# Patient Record
Sex: Male | Born: 1935
Health system: Southern US, Community
[De-identification: ages and names within clinical notes are randomized; demographics above are authoritative.]

## PROBLEM LIST (undated history)

## (undated) DIAGNOSIS — H269 Unspecified cataract: Secondary | ICD-10-CM

## (undated) DIAGNOSIS — I2510 Atherosclerotic heart disease of native coronary artery without angina pectoris: Secondary | ICD-10-CM

## (undated) DIAGNOSIS — Z860100 Personal history of colon polyps, unspecified: Secondary | ICD-10-CM

## (undated) DIAGNOSIS — T7840XA Allergy, unspecified, initial encounter: Secondary | ICD-10-CM

## (undated) DIAGNOSIS — M48061 Spinal stenosis, lumbar region without neurogenic claudication: Secondary | ICD-10-CM

## (undated) DIAGNOSIS — K5730 Diverticulosis of large intestine without perforation or abscess without bleeding: Secondary | ICD-10-CM

## (undated) DIAGNOSIS — J309 Allergic rhinitis, unspecified: Secondary | ICD-10-CM

## (undated) DIAGNOSIS — I252 Old myocardial infarction: Secondary | ICD-10-CM

## (undated) DIAGNOSIS — I219 Acute myocardial infarction, unspecified: Secondary | ICD-10-CM

## (undated) DIAGNOSIS — R6889 Other general symptoms and signs: Secondary | ICD-10-CM

## (undated) DIAGNOSIS — E785 Hyperlipidemia, unspecified: Secondary | ICD-10-CM

## (undated) DIAGNOSIS — S83206A Unspecified tear of unspecified meniscus, current injury, right knee, initial encounter: Secondary | ICD-10-CM

## (undated) DIAGNOSIS — Z87442 Personal history of urinary calculi: Secondary | ICD-10-CM

## (undated) DIAGNOSIS — Z9889 Other specified postprocedural states: Secondary | ICD-10-CM

## (undated) DIAGNOSIS — M169 Osteoarthritis of hip, unspecified: Secondary | ICD-10-CM

## (undated) DIAGNOSIS — N529 Male erectile dysfunction, unspecified: Secondary | ICD-10-CM

## (undated) DIAGNOSIS — M519 Unspecified thoracic, thoracolumbar and lumbosacral intervertebral disc disorder: Secondary | ICD-10-CM

## (undated) HISTORY — DX: Old myocardial infarction: I25.2

## (undated) HISTORY — DX: Atherosclerotic heart disease of native coronary artery without angina pectoris: I25.10

## (undated) HISTORY — DX: Osteoarthritis of hip, unspecified: M16.9

## (undated) HISTORY — DX: Allergic rhinitis, unspecified: J30.9

## (undated) HISTORY — PX: CARDIAC CATHETERIZATION: SHX172

## (undated) HISTORY — DX: Personal history of colon polyps, unspecified: Z86.0100

## (undated) HISTORY — DX: Personal history of colonic polyps: Z86.010

## (undated) HISTORY — PX: TONSILLECTOMY: SUR1361

## (undated) HISTORY — DX: Male erectile dysfunction, unspecified: N52.9

## (undated) HISTORY — PX: PTCA: SHX146

## (undated) HISTORY — DX: Hyperlipidemia, unspecified: E78.5

## (undated) HISTORY — PX: VASECTOMY: SHX75

## (undated) HISTORY — PX: CATARACT EXTRACTION: SUR2

## (undated) HISTORY — DX: Unspecified cataract: H26.9

## (undated) HISTORY — DX: Unspecified tear of unspecified meniscus, current injury, right knee, initial encounter: S83.206A

## (undated) HISTORY — PX: EYE SURGERY: SHX253

## (undated) HISTORY — DX: Acute myocardial infarction, unspecified: I21.9

## (undated) HISTORY — DX: Allergy, unspecified, initial encounter: T78.40XA

## (undated) HISTORY — DX: Other general symptoms and signs: R68.89

## (undated) HISTORY — PX: LUMBAR DISC SURGERY: SHX700

## (undated) HISTORY — PX: POLYPECTOMY: SHX149

## (undated) HISTORY — DX: Other specified postprocedural states: Z98.890

## (undated) HISTORY — PX: OTHER SURGICAL HISTORY: SHX169

## (undated) HISTORY — PX: COLONOSCOPY: SHX174

## (undated) HISTORY — DX: Spinal stenosis, lumbar region without neurogenic claudication: M48.061

## (undated) HISTORY — DX: Diverticulosis of large intestine without perforation or abscess without bleeding: K57.30

## (undated) HISTORY — DX: Unspecified thoracic, thoracolumbar and lumbosacral intervertebral disc disorder: M51.9

---

## 1958-12-20 HISTORY — PX: BACK SURGERY: SHX140

## 1997-12-20 DIAGNOSIS — I219 Acute myocardial infarction, unspecified: Secondary | ICD-10-CM

## 1997-12-20 HISTORY — PX: CORONARY ARTERY BYPASS GRAFT: SHX141

## 1997-12-20 HISTORY — DX: Acute myocardial infarction, unspecified: I21.9

## 1998-12-20 DIAGNOSIS — Z86010 Personal history of colonic polyps: Secondary | ICD-10-CM

## 1998-12-20 DIAGNOSIS — Z860101 Personal history of adenomatous and serrated colon polyps: Secondary | ICD-10-CM

## 1998-12-20 HISTORY — DX: Personal history of colonic polyps: Z86.010

## 1998-12-20 HISTORY — DX: Personal history of adenomatous and serrated colon polyps: Z86.0101

## 2002-10-16 ENCOUNTER — Encounter: Payer: Self-pay | Admitting: Internal Medicine

## 2002-10-16 ENCOUNTER — Encounter: Admission: RE | Admit: 2002-10-16 | Discharge: 2002-10-16 | Payer: Self-pay | Admitting: Internal Medicine

## 2003-12-24 ENCOUNTER — Encounter: Admission: RE | Admit: 2003-12-24 | Discharge: 2003-12-24 | Payer: Self-pay | Admitting: Internal Medicine

## 2005-05-05 ENCOUNTER — Ambulatory Visit: Payer: Self-pay | Admitting: Cardiology

## 2005-05-14 ENCOUNTER — Ambulatory Visit: Payer: Self-pay

## 2005-05-14 ENCOUNTER — Ambulatory Visit: Payer: Self-pay | Admitting: Cardiology

## 2005-10-20 ENCOUNTER — Ambulatory Visit: Payer: Self-pay | Admitting: Cardiology

## 2006-05-09 ENCOUNTER — Ambulatory Visit: Payer: Self-pay | Admitting: Cardiology

## 2007-01-10 ENCOUNTER — Ambulatory Visit: Payer: Self-pay | Admitting: Cardiology

## 2007-01-10 ENCOUNTER — Inpatient Hospital Stay (HOSPITAL_COMMUNITY): Admission: EM | Admit: 2007-01-10 | Discharge: 2007-01-11 | Payer: Self-pay | Admitting: Emergency Medicine

## 2007-01-16 ENCOUNTER — Ambulatory Visit: Payer: Self-pay

## 2007-01-16 ENCOUNTER — Ambulatory Visit: Payer: Self-pay | Admitting: Cardiology

## 2007-01-26 ENCOUNTER — Ambulatory Visit: Payer: Self-pay | Admitting: Internal Medicine

## 2007-02-27 ENCOUNTER — Ambulatory Visit: Payer: Self-pay | Admitting: Cardiology

## 2007-02-27 LAB — CONVERTED CEMR LAB
ALT: 23 units/L (ref 0–40)
AST: 25 units/L (ref 0–37)
Albumin: 3.6 g/dL (ref 3.5–5.2)
Alkaline Phosphatase: 40 units/L (ref 39–117)
Bilirubin, Direct: 0.1 mg/dL (ref 0.0–0.3)
Cholesterol: 99 mg/dL (ref 0–200)
Direct LDL: 33 mg/dL
HDL: 31.7 mg/dL — ABNORMAL LOW (ref >39.0)
PSA: 0.79 ng/mL (ref 0.10–4.00)
Total Bilirubin: 0.8 mg/dL (ref 0.3–1.2)
Total CHOL/HDL Ratio: 3.1
Total Protein: 6.5 g/dL (ref 6.0–8.3)
Triglycerides: 252 mg/dL (ref 0–149)
VLDL: 50 mg/dL — ABNORMAL HIGH (ref 0–40)

## 2007-04-25 ENCOUNTER — Ambulatory Visit: Payer: Self-pay | Admitting: Cardiology

## 2007-06-20 ENCOUNTER — Ambulatory Visit: Payer: Self-pay | Admitting: Cardiology

## 2007-06-20 LAB — CONVERTED CEMR LAB
ALT: 18 units/L (ref 0–53)
AST: 23 units/L (ref 0–37)
Albumin: 3.8 g/dL (ref 3.5–5.2)
Alkaline Phosphatase: 41 units/L (ref 39–117)
Bilirubin, Direct: 0.1 mg/dL (ref 0.0–0.3)
Cholesterol: 129 mg/dL (ref 0–200)
HDL: 32.1 mg/dL — ABNORMAL LOW (ref >39.0)
LDL Cholesterol: 67 mg/dL (ref 0–99)
Total Bilirubin: 1.4 mg/dL — ABNORMAL HIGH (ref 0.3–1.2)
Total CHOL/HDL Ratio: 4
Total Protein: 6.8 g/dL (ref 6.0–8.3)
Triglycerides: 148 mg/dL (ref 0–149)
VLDL: 30 mg/dL (ref 0–40)

## 2007-08-24 ENCOUNTER — Ambulatory Visit: Payer: Self-pay | Admitting: Cardiology

## 2007-08-24 LAB — CONVERTED CEMR LAB
ALT: 28 units/L (ref 0–53)
AST: 30 units/L (ref 0–37)
Albumin: 3.7 g/dL (ref 3.5–5.2)
Alkaline Phosphatase: 38 units/L — ABNORMAL LOW (ref 39–117)
Bilirubin, Direct: 0.2 mg/dL (ref 0.0–0.3)
Cholesterol: 87 mg/dL (ref 0–200)
HDL: 33.3 mg/dL — ABNORMAL LOW (ref >39.0)
LDL Cholesterol: 37 mg/dL (ref 0–99)
Total Bilirubin: 1.1 mg/dL (ref 0.3–1.2)
Total CHOL/HDL Ratio: 2.6
Total Protein: 6.8 g/dL (ref 6.0–8.3)
Triglycerides: 83 mg/dL (ref 0–149)
VLDL: 17 mg/dL (ref 0–40)

## 2007-08-30 ENCOUNTER — Ambulatory Visit: Payer: Self-pay | Admitting: Cardiology

## 2007-09-28 ENCOUNTER — Ambulatory Visit: Payer: Self-pay | Admitting: Cardiology

## 2007-09-28 LAB — CONVERTED CEMR LAB
ALT: 54 units/L — ABNORMAL HIGH (ref 0–53)
Albumin: 3.7 g/dL (ref 3.5–5.2)
Alkaline Phosphatase: 37 units/L — ABNORMAL LOW (ref 39–117)
LDL Cholesterol: 28 mg/dL (ref 0–99)
Total CHOL/HDL Ratio: 2.4

## 2007-10-14 ENCOUNTER — Ambulatory Visit: Payer: Self-pay | Admitting: Internal Medicine

## 2007-11-07 ENCOUNTER — Ambulatory Visit: Payer: Self-pay | Admitting: Cardiology

## 2007-11-07 LAB — CONVERTED CEMR LAB
Albumin: 3.9 g/dL (ref 3.5–5.2)
Bilirubin, Direct: 0.1 mg/dL (ref 0.0–0.3)
HDL: 28.9 mg/dL — ABNORMAL LOW (ref >39.0)
LDL Cholesterol: 44 mg/dL (ref 0–99)
Total CHOL/HDL Ratio: 3.3
Triglycerides: 109 mg/dL (ref 0–149)
VLDL: 22 mg/dL (ref 0–40)

## 2007-12-19 ENCOUNTER — Telehealth (INDEPENDENT_AMBULATORY_CARE_PROVIDER_SITE_OTHER): Payer: Self-pay | Admitting: *Deleted

## 2008-01-19 ENCOUNTER — Ambulatory Visit: Payer: Self-pay | Admitting: Cardiology

## 2008-01-19 LAB — CONVERTED CEMR LAB
Alkaline Phosphatase: 30 units/L — ABNORMAL LOW (ref 39–117)
Bilirubin, Direct: 0.2 mg/dL (ref 0.0–0.3)
Cholesterol: 134 mg/dL (ref 0–200)
HDL: 33.1 mg/dL — ABNORMAL LOW (ref >39.0)
Total Bilirubin: 1 mg/dL (ref 0.3–1.2)
Total CHOL/HDL Ratio: 4
Total Protein: 7 g/dL (ref 6.0–8.3)
Triglycerides: 129 mg/dL (ref 0–149)

## 2008-02-13 ENCOUNTER — Ambulatory Visit: Payer: Self-pay | Admitting: Internal Medicine

## 2008-02-13 DIAGNOSIS — Z8679 Personal history of other diseases of the circulatory system: Secondary | ICD-10-CM | POA: Insufficient documentation

## 2008-02-13 DIAGNOSIS — I2510 Atherosclerotic heart disease of native coronary artery without angina pectoris: Secondary | ICD-10-CM | POA: Insufficient documentation

## 2008-02-13 DIAGNOSIS — E785 Hyperlipidemia, unspecified: Secondary | ICD-10-CM | POA: Insufficient documentation

## 2008-02-13 DIAGNOSIS — K5730 Diverticulosis of large intestine without perforation or abscess without bleeding: Secondary | ICD-10-CM | POA: Insufficient documentation

## 2008-02-13 DIAGNOSIS — Z860100 Personal history of colon polyps, unspecified: Secondary | ICD-10-CM | POA: Insufficient documentation

## 2008-02-13 DIAGNOSIS — D126 Benign neoplasm of colon, unspecified: Secondary | ICD-10-CM | POA: Insufficient documentation

## 2008-02-13 LAB — CONVERTED CEMR LAB
Basophils Relative: 0.3 % (ref 0.0–1.0)
Bilirubin Urine: NEGATIVE
Bilirubin, Direct: 0.2 mg/dL (ref 0.0–0.3)
CO2: 30 meq/L (ref 19–32)
Cholesterol: 165 mg/dL (ref 0–200)
Direct LDL: 91.5 mg/dL
Eosinophils Relative: 2.1 % (ref 0.0–5.0)
GFR calc Af Amer: 84 mL/min
Glucose, Bld: 102 mg/dL — ABNORMAL HIGH (ref 70–99)
HDL: 31.3 mg/dL — ABNORMAL LOW (ref >39.0)
Hemoglobin: 16.4 g/dL (ref 13.0–17.0)
Leukocytes, UA: NEGATIVE
Lymphocytes Relative: 32.9 % (ref 12.0–46.0)
Monocytes Absolute: 0.8 10*3/uL — ABNORMAL HIGH (ref 0.2–0.7)
Monocytes Relative: 12.2 % — ABNORMAL HIGH (ref 3.0–11.0)
Neutro Abs: 3.5 10*3/uL (ref 1.4–7.7)
Potassium: 4.4 meq/L (ref 3.5–5.1)
Specific Gravity, Urine: >=1.03 (ref 1.000–1.03)
TSH: 1.48 microintl units/mL (ref 0.35–5.50)
Total Protein, Urine: NEGATIVE mg/dL
Total Protein: 7.6 g/dL (ref 6.0–8.3)
WBC: 6.5 10*3/uL (ref 4.5–10.5)

## 2008-02-20 ENCOUNTER — Encounter: Payer: Self-pay | Admitting: Internal Medicine

## 2008-03-13 ENCOUNTER — Ambulatory Visit: Payer: Self-pay | Admitting: Cardiology

## 2008-04-15 ENCOUNTER — Ambulatory Visit: Payer: Self-pay | Admitting: Internal Medicine

## 2008-04-15 DIAGNOSIS — M549 Dorsalgia, unspecified: Secondary | ICD-10-CM | POA: Insufficient documentation

## 2008-04-25 ENCOUNTER — Ambulatory Visit: Payer: Self-pay

## 2008-04-25 LAB — CONVERTED CEMR LAB
CO2: 27 meq/L (ref 19–32)
GFR calc Af Amer: 70 mL/min
Glucose, Bld: 107 mg/dL — ABNORMAL HIGH (ref 70–99)
Lymphocytes Relative: 32.5 % (ref 12.0–46.0)
Monocytes Absolute: 0.6 10*3/uL (ref 0.1–1.0)
Monocytes Relative: 9.7 % (ref 3.0–12.0)
Platelets: 234 10*3/uL (ref 150–400)
Potassium: 3.9 meq/L (ref 3.5–5.1)
RDW: 12.1 % (ref 11.5–14.6)
Sodium: 141 meq/L (ref 135–145)

## 2008-05-09 ENCOUNTER — Ambulatory Visit: Payer: Self-pay | Admitting: Internal Medicine

## 2008-06-27 ENCOUNTER — Ambulatory Visit: Payer: Self-pay | Admitting: Cardiology

## 2008-06-27 LAB — CONVERTED CEMR LAB
ALT: 21 units/L (ref 0–53)
Albumin: 4.1 g/dL (ref 3.5–5.2)
Alkaline Phosphatase: 35 units/L — ABNORMAL LOW (ref 39–117)
Cholesterol: 139 mg/dL (ref 0–200)
LDL Cholesterol: 80 mg/dL (ref 0–99)
Total Protein: 7.4 g/dL (ref 6.0–8.3)
Triglycerides: 125 mg/dL (ref 0–149)

## 2008-07-04 ENCOUNTER — Ambulatory Visit: Payer: Self-pay | Admitting: Cardiovascular Disease

## 2008-10-04 ENCOUNTER — Ambulatory Visit: Payer: Self-pay | Admitting: Internal Medicine

## 2008-10-21 ENCOUNTER — Ambulatory Visit: Payer: Self-pay | Admitting: Cardiology

## 2008-10-21 LAB — CONVERTED CEMR LAB
ALT: 19 units/L (ref 0–53)
AST: 20 units/L (ref 0–37)
Alkaline Phosphatase: 34 units/L — ABNORMAL LOW (ref 39–117)
Bilirubin, Direct: 0.1 mg/dL (ref 0.0–0.3)
HDL: 31 mg/dL — ABNORMAL LOW (ref >39.0)
Total Bilirubin: 1.1 mg/dL (ref 0.3–1.2)

## 2008-10-24 ENCOUNTER — Ambulatory Visit: Payer: Self-pay | Admitting: Cardiology

## 2009-02-17 ENCOUNTER — Ambulatory Visit: Payer: Self-pay | Admitting: Cardiology

## 2009-02-17 LAB — CONVERTED CEMR LAB
Alkaline Phosphatase: 31 units/L — ABNORMAL LOW (ref 39–117)
Bilirubin, Direct: 0.1 mg/dL (ref 0.0–0.3)
LDL Cholesterol: 73 mg/dL (ref 0–99)
Total Bilirubin: 1.4 mg/dL — ABNORMAL HIGH (ref 0.3–1.2)
Total CHOL/HDL Ratio: 4.3

## 2009-02-20 ENCOUNTER — Ambulatory Visit: Payer: Self-pay | Admitting: Cardiovascular Disease

## 2009-02-21 ENCOUNTER — Telehealth: Payer: Self-pay | Admitting: Internal Medicine

## 2009-02-25 DIAGNOSIS — I2541 Coronary artery aneurysm: Secondary | ICD-10-CM | POA: Insufficient documentation

## 2009-02-25 DIAGNOSIS — I25810 Atherosclerosis of coronary artery bypass graft(s) without angina pectoris: Secondary | ICD-10-CM | POA: Insufficient documentation

## 2009-02-25 DIAGNOSIS — I10 Essential (primary) hypertension: Secondary | ICD-10-CM | POA: Insufficient documentation

## 2009-02-26 ENCOUNTER — Encounter: Payer: Self-pay | Admitting: Cardiology

## 2009-02-26 ENCOUNTER — Ambulatory Visit: Payer: Self-pay | Admitting: Cardiology

## 2009-05-16 ENCOUNTER — Encounter (INDEPENDENT_AMBULATORY_CARE_PROVIDER_SITE_OTHER): Payer: Self-pay | Admitting: *Deleted

## 2009-06-16 ENCOUNTER — Ambulatory Visit: Payer: Self-pay | Admitting: Cardiology

## 2009-06-19 ENCOUNTER — Ambulatory Visit: Payer: Self-pay | Admitting: Cardiology

## 2009-06-20 ENCOUNTER — Encounter (INDEPENDENT_AMBULATORY_CARE_PROVIDER_SITE_OTHER): Payer: Self-pay | Admitting: *Deleted

## 2009-06-20 LAB — CONVERTED CEMR LAB
AST: 23 units/L (ref 0–37)
Albumin: 3.8 g/dL (ref 3.5–5.2)
Alkaline Phosphatase: 38 units/L — ABNORMAL LOW (ref 39–117)
Bilirubin, Direct: 0.1 mg/dL (ref 0.0–0.3)
Total CHOL/HDL Ratio: 4
Total Protein: 6.9 g/dL (ref 6.0–8.3)

## 2009-10-13 ENCOUNTER — Ambulatory Visit: Payer: Self-pay | Admitting: Internal Medicine

## 2009-10-13 DIAGNOSIS — R5383 Other fatigue: Secondary | ICD-10-CM

## 2009-10-13 DIAGNOSIS — R5381 Other malaise: Secondary | ICD-10-CM | POA: Insufficient documentation

## 2009-10-14 ENCOUNTER — Telehealth: Payer: Self-pay | Admitting: Internal Medicine

## 2009-10-20 ENCOUNTER — Ambulatory Visit: Payer: Self-pay | Admitting: Cardiology

## 2009-10-20 ENCOUNTER — Telehealth (INDEPENDENT_AMBULATORY_CARE_PROVIDER_SITE_OTHER): Payer: Self-pay | Admitting: *Deleted

## 2009-10-20 LAB — CONVERTED CEMR LAB
Alkaline Phosphatase: 29 units/L — ABNORMAL LOW (ref 39–117)
Bilirubin, Direct: 0 mg/dL (ref 0.0–0.3)
LDL Cholesterol: 62 mg/dL (ref 0–99)
Total Bilirubin: 1.1 mg/dL (ref 0.3–1.2)
Total CHOL/HDL Ratio: 4
Total Protein: 7 g/dL (ref 6.0–8.3)
VLDL: 28.2 mg/dL (ref 0.0–40.0)

## 2009-11-06 ENCOUNTER — Ambulatory Visit: Payer: Self-pay | Admitting: Cardiology

## 2010-04-27 ENCOUNTER — Ambulatory Visit: Payer: Self-pay | Admitting: Cardiology

## 2010-04-27 ENCOUNTER — Encounter: Payer: Self-pay | Admitting: Cardiology

## 2010-04-28 ENCOUNTER — Encounter: Payer: Self-pay | Admitting: Internal Medicine

## 2010-05-07 ENCOUNTER — Ambulatory Visit: Payer: Self-pay | Admitting: Internal Medicine

## 2010-05-14 ENCOUNTER — Ambulatory Visit: Payer: Self-pay | Admitting: Internal Medicine

## 2010-05-14 DIAGNOSIS — M25569 Pain in unspecified knee: Secondary | ICD-10-CM | POA: Insufficient documentation

## 2010-05-14 DIAGNOSIS — M25559 Pain in unspecified hip: Secondary | ICD-10-CM | POA: Insufficient documentation

## 2010-05-21 ENCOUNTER — Ambulatory Visit (HOSPITAL_COMMUNITY): Admission: RE | Admit: 2010-05-21 | Discharge: 2010-05-21 | Payer: Self-pay | Admitting: Internal Medicine

## 2010-05-25 ENCOUNTER — Encounter: Payer: Self-pay | Admitting: Internal Medicine

## 2010-07-14 ENCOUNTER — Ambulatory Visit: Payer: Self-pay | Admitting: Cardiology

## 2010-09-07 ENCOUNTER — Ambulatory Visit: Payer: Self-pay | Admitting: Internal Medicine

## 2010-10-29 ENCOUNTER — Ambulatory Visit: Payer: Self-pay | Admitting: Cardiology

## 2010-10-29 LAB — CONVERTED CEMR LAB
AST: 23 units/L (ref 0–37)
Albumin: 3.9 g/dL (ref 3.5–5.2)
HDL: 30.4 mg/dL — ABNORMAL LOW (ref >39.00)
Total Bilirubin: 0.8 mg/dL (ref 0.3–1.2)
Total CHOL/HDL Ratio: 4
Triglycerides: 170 mg/dL — ABNORMAL HIGH (ref 0.0–149.0)
VLDL: 34 mg/dL (ref 0.0–40.0)

## 2010-10-30 ENCOUNTER — Ambulatory Visit: Payer: Self-pay | Admitting: Internal Medicine

## 2010-10-30 DIAGNOSIS — R059 Cough, unspecified: Secondary | ICD-10-CM | POA: Insufficient documentation

## 2010-10-30 DIAGNOSIS — R05 Cough: Secondary | ICD-10-CM | POA: Insufficient documentation

## 2010-10-30 LAB — CONVERTED CEMR LAB
Basophils Relative: 0.4 % (ref 0.0–3.0)
Calcium: 9.3 mg/dL (ref 8.4–10.5)
Chloride: 106 meq/L (ref 96–112)
Creatinine, Ser: 1 mg/dL (ref 0.4–1.5)
Eosinophils Relative: 3.1 % (ref 0.0–5.0)
Hemoglobin: 16.8 g/dL (ref 13.0–17.0)
Lymphocytes Relative: 26.4 % (ref 12.0–46.0)
MCV: 94.6 fL (ref 78.0–100.0)
Neutro Abs: 3.7 10*3/uL (ref 1.4–7.7)
Neutrophils Relative %: 57.9 % (ref 43.0–77.0)
PSA: 0.87 ng/mL (ref 0.10–4.00)
RBC: 5.14 M/uL (ref 4.22–5.81)
Sodium: 140 meq/L (ref 135–145)
WBC: 6.4 10*3/uL (ref 4.5–10.5)

## 2010-11-05 ENCOUNTER — Ambulatory Visit: Payer: Self-pay

## 2010-11-25 ENCOUNTER — Telehealth: Payer: Self-pay | Admitting: Internal Medicine

## 2010-12-22 ENCOUNTER — Telehealth: Payer: Self-pay | Admitting: Internal Medicine

## 2011-01-17 LAB — CONVERTED CEMR LAB
AST: 28 units/L (ref 0–37)
Albumin: 4.4 g/dL (ref 3.5–5.2)
Alkaline Phosphatase: 32 units/L — ABNORMAL LOW (ref 39–117)
Basophils Absolute: 0 10*3/uL (ref 0.0–0.1)
Basophils Relative: 0.5 % (ref 0.0–3.0)
Bilirubin Urine: NEGATIVE
CO2: 28 meq/L (ref 19–32)
GFR calc non Af Amer: 62.75 mL/min (ref >60)
Glucose, Bld: 109 mg/dL — ABNORMAL HIGH (ref 70–99)
HCT: 48.7 % (ref 39.0–52.0)
Hemoglobin, Urine: NEGATIVE
Hemoglobin: 17 g/dL (ref 13.0–17.0)
Lymphocytes Relative: 35.1 % (ref 12.0–46.0)
Lymphs Abs: 2.3 10*3/uL (ref 0.7–4.0)
MCHC: 34.8 g/dL (ref 30.0–36.0)
Monocytes Relative: 10.6 % (ref 3.0–12.0)
Neutro Abs: 3.4 10*3/uL (ref 1.4–7.7)
Potassium: 4.4 meq/L (ref 3.5–5.1)
RBC: 5.12 M/uL (ref 4.22–5.81)
RDW: 12 % (ref 11.5–14.6)
Sodium: 140 meq/L (ref 135–145)
TSH: 1.53 microintl units/mL (ref 0.35–5.50)
Total Protein, Urine: NEGATIVE mg/dL
Total Protein: 7.7 g/dL (ref 6.0–8.3)
Urine Glucose: NEGATIVE mg/dL
Urobilinogen, UA: 0.2 (ref 0.0–1.0)

## 2011-01-19 NOTE — Assessment & Plan Note (Signed)
Summary: rov-tp   Primary Provider:  Corwin Levins MD   History of Present Illness: Pt presents today for 6 mo dyslipidemia follow-up.    Current lipid regimen includes Fish Oil 1000 mg daily, Niaspan 1000 mg qHS, and Crestor 5 mg daily alternating with 10 mg daily.  He uses this Crestor regimen to prevent muscle cramps and pain.  On Crestor 10 mg daily, patient was experiencing muscle pain/soreness, this has resolved with new/lower dose regimen.  Patient reports compliance and tolerance to medications, with only occassional and minor flushing with Niaspan.    Diet review:  Patient reports that he has not been doing good with his diet.  His weakness is snacks including icecream (which he eats regularly, although he does choose low-fat).  He also really enjoys potato chips and eats those regularly.  He also loves various specialty breads, but does try to choose the whole grain options.  Otherwise, he consumes low-fat, low-cholesterol diet that is heavy in fruits (3-4 servings/day) and vegetables (at least 2 servings/day).   Beverages include diet soda and skim milk.  Exercise: Walks 45 minutes daily when weather permits.  He also push mows his entire lawn which takes 1-2 hours.      Lipid Management Provider  Eda Keys, PharmD  Preventive Screening-Counseling & Management  Alcohol-Tobacco     Alcohol drinks/day: 0     Smoking Status: never  Allergies: 1)  ! Zocor 2)  Beta Blockers  Social History: Alcohol drinks/day:  0   Vital Signs:  Patient profile:   77 year old male Weight:      183 pounds BP sitting:   130 / 90  Impression & Recommendations:  Problem # 1:  HYPERLIPIDEMIA (ICD-272.4) Assessment Unchanged Lipids are as follows:  TC 119 (goal <200), TG 178 (goal <150), HDL 36 (goal >40), LDL 47 (goal <100).  Most recent lipid values are results of a [employer] Profile.  Liposcience profile reveals low particle number of 908, as well as small LDL particle size of 20.2  indicating that this patient is at increased atherogenic risk.  However, patients LDL is well below goal, and we will not attempt to lower the LDL additionally.  HDL and TGs both show room for improvement so these are areas we will target in an attempt to decrease CVD risk for this patient.  TGs have increaesd since last visit and are no longer at goal, a reflection of patients "snacking" and worsened dietary compliance since last f/u.   Plan:   Continue medications as directed. Increase Fish Oil to three times a day Watch snacking, minimize icecream and potato chips. Continue exercising as much as possible.   Follow-up in 6 months.    His updated medication list for this problem includes:    Crestor 10 Mg Tabs (Rosuvastatin calcium) .Marland Kitchen... Alternate taking 1 tablet and one half-tablet daily  Patient Instructions: 1)  Continue crestor at current dose. 2)  Increase fish oil to 3 times daily, take with meal for decreased aftertaste. 3)  Diet - Continue making healthy dietary choices, work on "snack" areas, etc. 4)  Exercise - continue walking and staying active 5)  Return to clinic in 6 months 6)  Lipid appointment on 11/05/10 @ 9:00 am 7)  Fasting Labwork (Liposcience profile to be ordered).  10/29/10 am 8)  It was a pleasure meeting with you.

## 2011-01-19 NOTE — Assessment & Plan Note (Signed)
Summary: U7O      Allergies Added:    Primary Provider:  Corwin Levins MD   History of Present Illness: The patient is 77 years old returned for management of CAD. He had bypass surgery in 1999 at [facility] in [place]. In 2008 he had a catheterization and was found to have a patent LIMA to LAD and a patent vein graft to the right coronary and no significant obstruction in the circumflex artery.  He has been doing well since that time. He has had no chest pain shortness of breath or palpitations. He's not and he would be quite as active because of the problem. He does say he's had some mild problem with balance.  His other problems include hyperlipidemia and lumbar disc disease.  His left lateral mid panel showed an HDL of 36 and LDL of 47 and triglyceride that was mildly elevated.  Current Medications (verified): 1)  Viagra 50 Mg  Tabs (Sildenafil Citrate) .Marland Kitchen.. 1 By Mouth Once Daily As Needed 2)  Folic Acid 400 Mcg  Tabs (Folic Acid) .Marland Kitchen.. 1 By Mouth Qd 3)  Plavix 75 Mg  Tabs (Clopidogrel Bisulfate) .Marland Kitchen.. 1 By Mouth Once Daily 4)  Ecotrin Low Strength 81 Mg  Tbec (Aspirin) .Marland Kitchen.. 1 By Mouth Qd 5)  Centrum Silver   Tabs (Multiple Vitamins-Minerals) .Marland Kitchen.. 1 By Mouth Qd 6)  Calcium .Marland Kitchen.. 1 By Mouth Qd 7)  Fish Oil 1000 Mg Caps (Omega-3 Fatty Acids) .Marland Kitchen.. 1 Capsule Daily 8)  Niaspan 1000 Mg Tbcr (Niacin (Antihyperlipidemic)) .Marland Kitchen.. 1 By Mouth Once Daily 9)  Crestor 10 Mg Tabs (Rosuvastatin Calcium) .... Alternate Taking 1 Tablet and One Half-Tablet Daily 10)  Robaxin-750 750 Mg Tabs (Methocarbamol) .Marland Kitchen.. 1 By Mouth Once Daily As Needed 11)  Oxycodone Hcl 5 Mg Caps (Oxycodone Hcl) .Marland Kitchen.. 1 - 2 By Mouth Q 6 Hrs As Needed For Pain  Allergies (verified): 1)  ! Zocor 2)  Beta Blockers  Past History:  Past Medical History: Reviewed history from 02/25/2009 and no changes required. Hyperlipidemia Colonic polyps, hx of Coronary artery disease E.D.  Diverticulosis, colon 1. Coronary  artery disease, status post coronary bypass graft surgery     in 1999 with coronary anatomy as described above. 2. Decreased exercise tolerance and exertional fatigue. 3. Hyperlipidemia with low HDL. 4. History of the lumbar disk disease with recent back pain and     buttock pain.   Review of Systems       ROS is negative except as outlined in HPI.   Vital Signs:  Patient profile:   77 year old male Height:      67.5 inches Weight:      182 pounds BMI:     28.19 Pulse rate:   71 / minute Resp:     18 per minute BP sitting:   114 / 77  Vitals Entered By: Burnett Kanaris, CNA (July 14, 2010 4:34 PM)  Physical Exam  Additional Exam:  Gen. Well-nourished, in no distress   Neck: No JVD, thyroid not enlarged, no carotid bruits Lungs: No tachypnea, clear without rales, rhonchi or wheezes Cardiovascular: Rhythm regular, PMI not displaced,  heart sounds  normal, no murmurs or gallops, no peripheral edema, pulses normal in all 4 extremities. Abdomen: BS normal, abdomen soft and non-tender without masses or organomegaly, no hepatosplenomegaly. MS: No deformities, no cyanosis or clubbing   Neuro:  No focal sns   Skin:  no lesions    Impression & Recommendations:  Problem # 1:  CAD, AUTOLOGOUS BYPASS GRAFT (ICD-414.02) He has had remote CABG and had patent grafts at last cath.  no recent CP.  This is stable. His updated medication list for this problem includes:    Plavix 75 Mg Tabs (Clopidogrel bisulfate) .Marland Kitchen... 1 by mouth once daily    Ecotrin Low Strength 81 Mg Tbec (Aspirin) .Marland Kitchen... 1 by mouth qd  Orders: EKG w/ Interpretation (93000)  Problem # 2:  HYPERLIPIDEMIA (ICD-272.4) He is followed in lipit clinic.  His HDL is a little low and TG a little high.  Niaspan was increased. His updated medication list for this problem includes:    Crestor 10 Mg Tabs (Rosuvastatin calcium) .Marland Kitchen... Alternate taking 1 tablet and one half-tablet daily  Patient Instructions: 1)  Your physician  recommends that you continue on your current medications as directed. Please refer to the Current Medication list given to you today. 2)  Your physician wants you to follow-up in: 1 year with Dr. Clifton James.  You will receive a reminder letter in the mail two months in advance. If you don't receive a letter, please call our office to schedule the follow-up appointment.

## 2011-01-19 NOTE — Assessment & Plan Note (Signed)
Summary: flu shot/john/cd   Nurse Visit   Allergies: 1)  ! Zocor 2)  Beta Blockers  Orders Added: 1)  Flu Vaccine 35yrs + MEDICARE PATIENTS [Q2039] 2)  Administration Flu vaccine - MCR [G0008] Flu Vaccine Consent Questions     Do you have a history of severe allergic reactions to this vaccine? no    Any prior history of allergic reactions to egg and/or gelatin? no    Do you have a sensitivity to the preservative Thimersol? no    Do you have a past history of Guillan-Barre Syndrome? no    Do you currently have an acute febrile illness? no    Have you ever had a severe reaction to latex? no    Vaccine information given and explained to patient? yes    Are you currently pregnant? no    Lot Number:[id number]   Exp Date:06/19/2011   Site Given  Left Deltoid IMistration Flu vaccine - MCR [G0008] .lbmedflu

## 2011-01-19 NOTE — Assessment & Plan Note (Signed)
Summary: PROBLEM WITH LEG/NWS   Vital Signs:  Patient profile:   77 year old male Height:      67.5 inches Weight:      182.50 pounds BMI:     28.26 O2 Sat:      96 % on Room air Temp:     97.1 degrees F oral Pulse rate:   64 / minute BP sitting:   104 / 70  (left arm) Cuff size:   regular  Vitals Entered ByZella Ball Ewing (May 14, 2010 4:17 PM)  O2 Flow:  Room air CC: Right knee and leg pain/RE   Primary Care Provider:  Corwin Levins MD  CC:  Right knee and leg pain/RE.  History of Present Illness: here wtih right knee pain for 1 wk, radiates to the right hip, as well as below the knee, assoc with mild swelling, catch (no lock) and worse to standing for over 5 min sometime wtih severe pain such as in the line at [employer] to checkout.  Pain ranges from 3/10 to 7/10, achy, seemed to give way at the [employer] as well and had to sit down.  Has been doing more walking such as long walk [place] the [place] over the past weekend, then mowed the grass a few days ago without seeming increase in pain.  Also has some right hip pain with ambualtion, not clear if always coming from the knee, but knee pain the worst actualy with crossing the legs to sit with right over left.  Pain worse this am in particular , despite 2 tylenol and 750 robaxin.  No lower back pain, radicular pain, or LE weakness or numbness.  No actual falls or injury and no obvious twisting or injury.  No hx of gout, fever, wt loss, night sweats.    Preventive Screening-Counseling & Management      Drug Use:  no.    Problems Prior to Update: 1)  Hip Pain, Right  (ICD-719.45) 2)  Knee Pain, Right, Acute  (ICD-719.46) 3)  Special Screening Malig Neoplasms Other Sites  (ICD-V76.49) 4)  Fatigue  (ICD-780.79) 5)  Encounter For Long-term Use of Other Medications  (ICD-V58.69) 6)  Hypertension, Benign  (ICD-401.1) 7)  Coronary Artery Aneurysm  (ICD-414.11) 8)  Cad, Autologous Bypass Graft  (ICD-414.02) 9)  Back Pain  (ICD-724.5) 10)   Preventive Health Care  (ICD-V70.0) 11)  Diverticulosis, Colon  (ICD-562.10) 12)  Coronary Artery Disease  (ICD-414.00) 13)  Colonic Polyps, Hx of  (ICD-V12.72) 14)  Hyperlipidemia  (ICD-272.4) 15)  Colonic Polyps  (ICD-211.3) 16)  Cardiac Disease, Hx of  (ICD-V12.50)  Medications Prior to Update: 1)  Viagra 50 Mg  Tabs (Sildenafil Citrate) .Marland Kitchen.. 1 By Mouth Once Daily As Needed 2)  Folic Acid 400 Mcg  Tabs (Folic Acid) .Marland Kitchen.. 1 By Mouth Qd 3)  Plavix 75 Mg  Tabs (Clopidogrel Bisulfate) .Marland Kitchen.. 1 By Mouth Once Daily 4)  Ecotrin Low Strength 81 Mg  Tbec (Aspirin) .Marland Kitchen.. 1 By Mouth Qd 5)  Centrum Silver   Tabs (Multiple Vitamins-Minerals) .Marland Kitchen.. 1 By Mouth Qd 6)  Calcium .Marland Kitchen.. 1 By Mouth Qd 7)  Fish Oil 1000 Mg Caps (Omega-3 Fatty Acids) .Marland Kitchen.. 1 Capsule Daily 8)  Niaspan 1000 Mg Tbcr (Niacin (Antihyperlipidemic)) .Marland Kitchen.. 1 By Mouth Once Daily 9)  Crestor 10 Mg Tabs (Rosuvastatin Calcium) .... Alternate Taking 1 Tablet and One Half-Tablet Daily 10)  Robaxin-750 750 Mg Tabs (Methocarbamol) .Marland Kitchen.. 1 By Mouth Once Daily As Needed  Current Medications (verified):  1)  Viagra 50 Mg  Tabs (Sildenafil Citrate) .Marland Kitchen.. 1 By Mouth Once Daily As Needed 2)  Folic Acid 400 Mcg  Tabs (Folic Acid) .Marland Kitchen.. 1 By Mouth Qd 3)  Plavix 75 Mg  Tabs (Clopidogrel Bisulfate) .Marland Kitchen.. 1 By Mouth Once Daily 4)  Ecotrin Low Strength 81 Mg  Tbec (Aspirin) .Marland Kitchen.. 1 By Mouth Qd 5)  Centrum Silver   Tabs (Multiple Vitamins-Minerals) .Marland Kitchen.. 1 By Mouth Qd 6)  Calcium .Marland Kitchen.. 1 By Mouth Qd 7)  Fish Oil 1000 Mg Caps (Omega-3 Fatty Acids) .Marland Kitchen.. 1 Capsule Daily 8)  Niaspan 1000 Mg Tbcr (Niacin (Antihyperlipidemic)) .Marland Kitchen.. 1 By Mouth Once Daily 9)  Crestor 10 Mg Tabs (Rosuvastatin Calcium) .... Alternate Taking 1 Tablet and One Half-Tablet Daily 10)  Robaxin-750 750 Mg Tabs (Methocarbamol) .Marland Kitchen.. 1 By Mouth Once Daily As Needed 11)  Oxycodone Hcl 5 Mg Caps (Oxycodone Hcl) .Marland Kitchen.. 1 - 2 By Mouth Q 6 Hrs As Needed For Pain  Allergies (verified): 1)  ! Zocor 2)  Beta  Blockers  Past History:  Past Medical History: Last updated: 02/25/2009 Hyperlipidemia Colonic polyps, hx of Coronary artery disease E.D.  Diverticulosis, colon 1. Coronary artery disease, status post coronary bypass graft surgery     in 1999 with coronary anatomy as described above. 2. Decreased exercise tolerance and exertional fatigue. 3. Hyperlipidemia with low HDL. 4. History of the lumbar disk disease with recent back pain and     buttock pain.   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   Head:  normocephalic and atraumatic.   Eyes:  vision grossly intact, pupils equal, and pupils round.   Ears:  R ear normal and L ear normal.   Nose:  no external deformity and no nasal discharge.   Mouth:  no gingival abnormalities and pharynx pink and moist.   Neck:  supple and no masses.   Lungs:  normal respiratory effort and normal breath sounds.   Heart:  normal rate and regular rhythm.   Msk:  right knee with trace to mild efusion, FROM and mild crepitus right anterior and right lateral knee;  knee NT, also no tender calf or thigh or ligamentous complexes;  right hip FROM, no tender to lateral hip area or lumbar spine Extremities:  no edema, no erythema  Neurologic:   strength normal in all extremities., gait mild antalgic favoring the RLE   Impression & Recommendations:  Problem # 1:  KNEE PAIN, RIGHT, ACUTE (ICD-719.46)  His updated medication list for this problem includes:    Ecotrin Low Strength 81 Mg Tbec (Aspirin) .Marland Kitchen... 1 by mouth qd    Robaxin-750 750 Mg Tabs (Methocarbamol) .Marland Kitchen... 1 by mouth once daily as needed    Oxycodone Hcl 5 Mg Caps (Oxycodone hcl) .Marland Kitchen... 1 - 2 by mouth q 6 hrs as needed for pain  Orders: Radiology Referral (Radiology) Orthopedic  Surgeon Referral (Ortho Surgeon) with mild effusion, with variable pain - suspect cartilage tear, treat as above, f/u any worsening signs or symptoms , refer MRI knee and ortho referral  Problem # 2:  HIP PAIN, RIGHT (ICD-719.45)  His updated medication list for this problem includes:    Ecotrin Low Strength 81 Mg Tbec (Aspirin) .Marland Kitchen... 1 by mouth qd    Robaxin-750 750 Mg Tabs (Methocarbamol) .Marland Kitchen... 1 by mouth once daily as needed    Oxycodone Hcl 5 Mg Caps (Oxycodone hcl) .Marland Kitchen... 1 - 2 by mouth q 6 hrs as needed for pain  Orders: T-Hip Comp Right Min 2 views (73510TC) ? clinical significance to the primary problem above- to check film for completeness to r/o djd, avn, or other rare such as pagets.  Problem # 3:  HYPERTENSION, BENIGN (ICD-401.1)  BP today: 104/70 Prior BP: 130/90 (05/07/2010)  Labs Reviewed: K+: 4.4 (10/13/2009) Creat: : 1.2 (10/13/2009)   Chol: 119 (10/20/2009)   HDL: 29.30 (10/20/2009)   LDL: 62 (10/20/2009)   TG: 141.0 (10/20/2009) stable overall by hx and exam, ok to continue meds/tx as is - does not require med at this time  Complete Medication List: 1)  Viagra 50 Mg Tabs (Sildenafil citrate) .Marland Kitchen.. 1 by mouth once daily as needed 2)  Folic Acid 400 Mcg Tabs (Folic acid) .Marland Kitchen.. 1 by mouth qd 3)  Plavix 75 Mg Tabs (Clopidogrel bisulfate) .Marland Kitchen.. 1 by mouth once daily 4)  Ecotrin Low Strength 81 Mg Tbec (Aspirin) .Marland Kitchen.. 1 by mouth qd 5)  Centrum Silver Tabs (Multiple  vitamins-minerals) .Marland Kitchen.. 1 by mouth qd 6)  Calcium  .Marland KitchenMarland Kitchen. 1 by mouth qd 7)  Fish Oil 1000 Mg Caps (Omega-3 fatty acids) .Marland Kitchen.. 1 capsule daily 8)  Niaspan 1000 Mg Tbcr (niacin (antihyperlipidemic))  .Marland Kitchen.. 1 by mouth once daily 9)  Crestor 10 Mg Tabs (Rosuvastatin calcium) .... Alternate taking 1 tablet and one half-tablet daily 10)  Robaxin-750 750 Mg Tabs (Methocarbamol) .Marland Kitchen.. 1 by mouth once daily as needed 11)  Oxycodone Hcl 5 Mg Caps (Oxycodone hcl) .Marland Kitchen.. 1 - 2 by mouth q 6 hrs as needed for pain  Patient Instructions: 1)  Please take all new medications as prescribed 2)  Continue all previous medications as before this visit  3)  You will be contacted about the referral(s) to: MRI for the right knee 4)  Please go to Radiology in the basement level for your X-Ray today  5)  You will be contacted about the referral(s) to: orthopedic 6)  please call the number on the blue card for results on the xray  7)  Please schedule a follow-up appointment in 5 months for your yearly exam (the office will call) Prescriptions: OXYCODONE HCL 5 MG CAPS (OXYCODONE HCL) 1 - 2 by mouth q 6 hrs as needed for pain  #60 x 0   Entered and Authorized by:   Corwin Levins MD   Signed by:   Corwin Levins MD on 05/14/2010   Method used:   Print then Give to Patient   RxID:   [id number] ROBAXIN-750 750 MG TABS (METHOCARBAMOL) 1 by mouth once daily as needed  #30 x 5   Entered and Authorized by:   Corwin Levins MD   Signed by:   Corwin Levins MD on 05/14/2010   Method used:   Print then Give to Patient   RxID:   [id number]

## 2011-01-19 NOTE — Progress Notes (Signed)
Summary: medication clarification  Phone Note From Pharmacy   Caller: [facility] Summary of Call: Pharmacy has received order for Niaspan extended release film coated tablets the prescription is written for Niaspan CR tablets. Confirm that Niaspan FCT are suitable for your patient. Initial call taken by: [name] Ewing CMA Duncan Dull),  November 25, 2010 8:58 AM  Follow-up for Phone Call        ok for generic niacin CR 1000 once daily   to robin Follow-up by: Corwin Levins MD,  November 25, 2010 10:57 AM  Additional Follow-up for Phone Call Additional follow up Details #1::        faxed hardcopy to pharmacy Additional Follow-up by: [name] Ewing CMA Duncan Dull),  November 25, 2010 11:54 AM    New/Updated Medications: NIACIN CR 1000 MG CR-TABS (NIACIN) 1po once daily  (generic) Prescriptions: NIACIN CR 1000 MG CR-TABS (NIACIN) 1po once daily  (generic)  #90 x 3   Entered and Authorized by:   Corwin Levins MD   Signed by:   Corwin Levins MD on 11/25/2010   Method used:   Print then Give to Patient   RxID:   [id number]

## 2011-01-19 NOTE — Letter (Signed)
Summary: [facility]  [facility]   Imported By: Sherian Rein 06/09/2010 11:52:04  _____________________________________________________________________  External Attachment:    Type:   Image     Comment:   External Document

## 2011-01-19 NOTE — Assessment & Plan Note (Signed)
Summary: lipid rov/eac   Current lipid regimen includes Fish Oil 1000 mg daily, Niaspan 1000 mg qHS, and Crestor 5 mg daily alternating with 10 mg daily.  He uses this Crestor regimen to prevent muscle cramps and pain.  At last visit, patient had been instructed to increase Fish Oil to 2-3 times daily, but patient neglected to do this.   Patient reports compliance and tolerance to medications, with only occassional and minor flushing with Niaspan.    Diet review:  Patient reports that his diet is less than optimal.  He continues to have a  weakness for snacks including icecream (which he eats regularly, although he does choose low-fat).  He also really enjoys potato chips and eats those regularly.  He also loves various specialty breads, but does try to choose the whole grain options.  Otherwise, he consumes low-fat, low-cholesterol diet that is heavy in fruits (3-4 servings/day) and vegetables (at least 2 servings/day).   Beverages include diet soda and skim milk.  Exercise: Pt continues to exercise regularly including walking 45 minutes daily when weather permits.  He also push mows his entire lawn which takes 1-2 hours.      Lipid Management Hugh Garrow  Eda Keys, PharmD  Preventive Screening-Counseling & Management  Alcohol-Tobacco     Smoking Status: never  Current Medications (verified): 1)  Viagra 100 Mg Tabs (Sildenafil Citrate) .Marland Kitchen.. 1 By Mouth Every Other Day 2)  Folic Acid 400 Mcg  Tabs (Folic Acid) .Marland Kitchen.. 1 By Mouth Qd 3)  Plavix 75 Mg  Tabs (Clopidogrel Bisulfate) .Marland Kitchen.. 1 By Mouth Once Daily 4)  Ecotrin Low Strength 81 Mg  Tbec (Aspirin) .Marland Kitchen.. 1 By Mouth Qd 5)  Centrum Silver   Tabs (Multiple Vitamins-Minerals) .Marland Kitchen.. 1 By Mouth Qd 6)  Calcium .Marland Kitchen.. 1 By Mouth Qd 7)  Fish Oil 1000 Mg Caps (Omega-3 Fatty Acids) .Marland Kitchen.. 1 Capsule Two Times A Day 8)  Niaspan 1000 Mg Tbcr (Niacin (Antihyperlipidemic)) .Marland Kitchen.. 1 By Mouth Once Daily 9)  Crestor 10 Mg Tabs (Rosuvastatin Calcium) .... Alternate  Taking 1 Tablet and One Half-Tablet Daily 10)  Tessalon Perles 100 Mg Caps (Benzonatate) .Marland Kitchen.. 1-2 By Mouth Three Times A Day As Needed Cough 11)  Tylenol Arthritis Pain 650 Mg Cr-Tabs (Acetaminophen) .Marland Kitchen.. 1po Three Times A Day As Needed Pain  Allergies: 1)  ! Zocor 2)  Beta Blockers   Vital Signs:  Patient profile:   77 year old male BP sitting:   126 / 84  (left arm)  Impression & Recommendations:  Problem # 1:  HYPERLIPIDEMIA (ICD-272.4) Assessment Deteriorated Lipid results are as follows:  TC 121 (goal <200), TG 170 (goal <150), HDL 30.4 (goal >40), LDL 57 (goal <70).  Patient is at goal for LDL, but is no longer at goal for TG, and is also not at goal for HDL.  Patient's TGs have increased slightly since last visit (previously 141).  Regarding lifestyle, patient has made no changes to diet/exercise since last visit 6 months ago.  He continues to make healthy dietary choices, but also reports that his diet is less than optimal and that he does snack a lot, primarily on sweets (ice cream, cookies, etc.)  He continues to exercise as he always has, walking and doing routine yard work.  For now we will focus on getting TGs to goal.  I have instructed patient to increase Fish Oil to 1 capsule 2-3 times daily (vs once daily).   He will also consider decreasing the amt of  snack foods he consumes.  Patient will follow-up in 6 months.  I have provided pt with 1 month supply of Crestor 10 mg.    His updated medication list for this problem includes:    Crestor 10 Mg Tabs (Rosuvastatin calcium) .Marland Kitchen... Alternate taking 1 tablet and one half-tablet daily

## 2011-01-19 NOTE — Assessment & Plan Note (Signed)
Summary: YEARLY MEDICARE FU/ LABS AFTER /NWS #   Vital Signs:  Patient profile:   77 year old male Height:      67.5 inches Weight:      184.25 pounds BMI:     28.53 O2 Sat:      95 % on Room air Temp:     97.8 degrees F oral Pulse rate:   68 / minute BP sitting:   102 / 62  (left arm) Cuff size:   regular  Vitals Entered By: Zella Ball Ewing CMA Duncan Dull) (October 30, 2010 8:52 AM)  O2 Flow:  Room air  CC: Yearly/RE   Primary Care Provider:  Corwin Levins MD  CC:  Ulla Potash.  History of Present Illness: here for yearly and general f/u - overall doing well;  needs med refills;  had recent URI with low grade temp, ST and sinus congestion which has resolved, but still with non prod cough and Pt denies CP, worsening sob, doe, wheezing, orthopnea, pnd, worsening LE edema, palps, dizziness or syncope  Pt denies new neuro symptoms such as headache, facial or extremity weakness  No fever, wt loss, night sweats, loss of appetite or other constitutional symptoms  Pt denies polydipsia, polyuria,  Overall good compliance with meds, trying to follow low chol, DM diet, wt stable, little excercise however Pt states good ability with ADL's, low fall risk, home safety reviewed and adequate, no significant change in hearing or vision, trying to follow lower chol diet, and occasionally active only with regular excercise.  Has ongoing mild fatigue without osa symptoms and Denies worsening depressive symptoms, suicidal ideation, or panic.    Problems Prior to Update: 1)  Special Screening Malig Neoplasms Other Sites  (ICD-V76.49) 2)  Cough  (ICD-786.2) 3)  Hip Pain, Right  (ICD-719.45) 4)  Knee Pain, Right, Acute  (ICD-719.46) 5)  Special Screening Malig Neoplasms Other Sites  (ICD-V76.49) 6)  Fatigue  (ICD-780.79) 7)  Encounter For Long-term Use of Other Medications  (ICD-V58.69) 8)  Hypertension, Benign  (ICD-401.1) 9)  Coronary Artery Aneurysm  (ICD-414.11) 10)  Cad, Autologous Bypass Graft   (ICD-414.02) 11)  Back Pain  (ICD-724.5) 12)  Preventive Health Care  (ICD-V70.0) 13)  Diverticulosis, Colon  (ICD-562.10) 14)  Coronary Artery Disease  (ICD-414.00) 15)  Colonic Polyps, Hx of  (ICD-V12.72) 16)  Hyperlipidemia  (ICD-272.4) 17)  Colonic Polyps  (ICD-211.3) 18)  Cardiac Disease, Hx of  (ICD-V12.50)  Medications Prior to Update: 1)  Viagra 50 Mg  Tabs (Sildenafil Citrate) .Marland Kitchen.. 1 By Mouth Once Daily As Needed 2)  Folic Acid 400 Mcg  Tabs (Folic Acid) .Marland Kitchen.. 1 By Mouth Qd 3)  Plavix 75 Mg  Tabs (Clopidogrel Bisulfate) .Marland Kitchen.. 1 By Mouth Once Daily 4)  Ecotrin Low Strength 81 Mg  Tbec (Aspirin) .Marland Kitchen.. 1 By Mouth Qd 5)  Centrum Silver   Tabs (Multiple Vitamins-Minerals) .Marland Kitchen.. 1 By Mouth Qd 6)  Calcium .Marland Kitchen.. 1 By Mouth Qd 7)  Fish Oil 1000 Mg Caps (Omega-3 Fatty Acids) .Marland Kitchen.. 1 Capsule Daily 8)  Niaspan 1000 Mg Tbcr (Niacin (Antihyperlipidemic)) .Marland Kitchen.. 1 By Mouth Once Daily 9)  Crestor 10 Mg Tabs (Rosuvastatin Calcium) .... Alternate Taking 1 Tablet and One Half-Tablet Daily 10)  Robaxin-750 750 Mg Tabs (Methocarbamol) .Marland Kitchen.. 1 By Mouth Once Daily As Needed 11)  Oxycodone Hcl 5 Mg Caps (Oxycodone Hcl) .Marland Kitchen.. 1 - 2 By Mouth Q 6 Hrs As Needed For Pain  Current Medications (verified): 1)  Viagra 100 Mg Tabs (Sildenafil  Citrate) .Marland Kitchen.. 1 By Mouth Every Other Day 2)  Folic Acid 400 Mcg  Tabs (Folic Acid) .Marland Kitchen.. 1 By Mouth Qd 3)  Plavix 75 Mg  Tabs (Clopidogrel Bisulfate) .Marland Kitchen.. 1 By Mouth Once Daily 4)  Ecotrin Low Strength 81 Mg  Tbec (Aspirin) .Marland Kitchen.. 1 By Mouth Qd 5)  Centrum Silver   Tabs (Multiple Vitamins-Minerals) .Marland Kitchen.. 1 By Mouth Qd 6)  Calcium .Marland Kitchen.. 1 By Mouth Qd 7)  Fish Oil 1000 Mg Caps (Omega-3 Fatty Acids) .Marland Kitchen.. 1 Capsule Daily 8)  Niaspan 1000 Mg Tbcr (Niacin (Antihyperlipidemic)) .Marland Kitchen.. 1 By Mouth Once Daily 9)  Crestor 10 Mg Tabs (Rosuvastatin Calcium) .... Alternate Taking 1 Tablet and One Half-Tablet Daily 10)  Tessalon Perles 100 Mg Caps (Benzonatate) .Marland Kitchen.. 1-2 By Mouth Three Times A Day As  Needed Cough 11)  Tylenol Arthritis Pain 650 Mg Cr-Tabs (Acetaminophen) .Marland Kitchen.. 1po Three Times A Day As Needed Pain  Allergies (verified): 1)  ! Zocor 2)  Beta Blockers  Past History:  Past Medical History: Last updated: 02/25/2009 Hyperlipidemia Colonic polyps, hx of Coronary artery disease E.D.  Diverticulosis, colon 1. Coronary artery disease, status post coronary bypass graft surgery     in 1999 with coronary anatomy as described above. 2. Decreased exercise tolerance and exertional fatigue. 3. Hyperlipidemia with low HDL. 4. History of the lumbar disk disease with recent back pain and     buttock pain.   Past Surgical History: Last updated: 02/13/2008 Coronary artery bypass graft Cataract extraction Percutaneous transluminal coronary angioplasty Tonsillectomy Vasectomy nodules on thyroid lumbar disc surgury  Social History: Last updated: 05/14/2010 moved from [place] retired Data processing manager Never Smoked Married Alcohol use-no Drug use-no  Risk Factors: Alcohol Use: 0 (05/07/2010)  Risk Factors: Smoking Status: never (05/07/2010)  Review of Systems       all otherwise negative per pt -    Physical Exam  General:  alert and overweight-appearing.   Head:  normocephalic and atraumatic.   Eyes:  vision grossly intact, pupils equal, and pupils round.   Ears:  R ear normal and L ear normal.   Nose:  no external deformity and no nasal discharge.   Mouth:  no gingival abnormalities and pharynx pink and moist.   Neck:  supple and no masses.   Lungs:  normal respiratory effort and normal breath sounds.   Heart:  normal rate and regular rhythm.   Abdomen:  soft, non-tender, and normal bowel sounds.   Msk:  no joint tenderness and no joint swelling.   Extremities:  no edema, no erythema  Neurologic:  strength normal in all extremities, sensation intact to light touch, and gait normal.   Skin:  color normal and no rashes.   Psych:  not anxious appearing  and not depressed appearing.     Impression & Recommendations:  Problem # 1:  FATIGUE (ICD-780.79) exam benign, to check labs below; follow with expectant management  Orders: TLB-BMP (Basic Metabolic Panel-BMET) (80048-METABOL) TLB-CBC Platelet - w/Differential (85025-CBCD) TLB-TSH (Thyroid Stimulating Hormone) (84443-TSH)  Problem # 2:  HYPERTENSION, BENIGN (ICD-401.1)  BP today: 102/62 Prior BP: 114/77 (07/14/2010)  Labs Reviewed: K+: 4.4 (10/13/2009) Creat: : 1.2 (10/13/2009)   Chol: 121 (10/29/2010)   HDL: 30.40 (10/29/2010)   LDL: 57 (10/29/2010)   TG: 170.0 (10/29/2010) stable overall by hx and exam, ok to continue meds/tx as is  - no meds needed at this time  Problem # 3:  COUGH (ICD-786.2) post uri , for tess perle as needed, f/u  any worening s/s  Problem # 4:  HYPERLIPIDEMIA (ICD-272.4)  His updated medication list for this problem includes:    Crestor 10 Mg Tabs (Rosuvastatin calcium) .Marland Kitchen... Alternate taking 1 tablet and one half-tablet daily  Labs Reviewed: SGOT: 23 (10/29/2010)   SGPT: 22 (10/29/2010)   HDL:30.40 (10/29/2010), 29.30 (10/20/2009)  LDL:57 (10/29/2010), 62 (10/20/2009)  Chol:121 (10/29/2010), 119 (10/20/2009)  Trig:170.0 (10/29/2010), 141.0 (10/20/2009) stable overall by hx and exam, ok to continue meds/tx as is   Complete Medication List: 1)  Viagra 100 Mg Tabs (Sildenafil citrate) .Marland Kitchen.. 1 by mouth every other day 2)  Folic Acid 400 Mcg Tabs (Folic acid) .Marland Kitchen.. 1 by mouth qd 3)  Plavix 75 Mg Tabs (Clopidogrel bisulfate) .Marland Kitchen.. 1 by mouth once daily 4)  Ecotrin Low Strength 81 Mg Tbec (Aspirin) .Marland Kitchen.. 1 by mouth qd 5)  Centrum Silver Tabs (Multiple vitamins-minerals) .Marland Kitchen.. 1 by mouth qd 6)  Calcium  .Marland KitchenMarland Kitchen. 1 by mouth qd 7)  Fish Oil 1000 Mg Caps (Omega-3 fatty acids) .Marland Kitchen.. 1 capsule daily 8)  Niaspan 1000 Mg Tbcr (niacin (antihyperlipidemic))  .Marland Kitchen.. 1 by mouth once daily 9)  Crestor 10 Mg Tabs (Rosuvastatin calcium) .... Alternate taking 1 tablet and one  half-tablet daily 10)  Tessalon Perles 100 Mg Caps (Benzonatate) .Marland Kitchen.. 1-2 by mouth three times a day as needed cough 11)  Tylenol Arthritis Pain 650 Mg Cr-tabs (Acetaminophen) .Marland Kitchen.. 1po three times a day as needed pain  Other Orders: TLB-PSA (Prostate Specific Antigen) (84153-PSA)  Patient Instructions: 1)  we will try to addon your blood work to the blood drawn 2)  robin will call if unable to addon the appropriate blood work 3)  Continue all previous medications as before this visit 4)  You can also take tylenol arthritis three times a day as needed for pain 5)  Please take all new medications as prescribed - the tesselon perle for cough as needed  6)  Please schedule a follow-up appointment in 1 year or sooner if needed Prescriptions: TESSALON PERLES 100 MG CAPS (BENZONATATE) 1-2 by mouth three times a day as needed cough  #60 x 1   Entered and Authorized by:   Corwin Levins MD   Signed by:   Corwin Levins MD on 10/30/2010   Method used:   Print then Give to Patient   RxID:   [id number] PLAVIX 75 MG  TABS (CLOPIDOGREL BISULFATE) 1 by mouth once daily  #90 x 3   Entered and Authorized by:   Corwin Levins MD   Signed by:   Corwin Levins MD on 10/30/2010   Method used:   Print then Give to Patient   RxID:   [id number] CRESTOR 10 MG TABS (ROSUVASTATIN CALCIUM) Alternate taking 1 tablet and one half-tablet daily  #90 x 3   Entered and Authorized by:   Corwin Levins MD   Signed by:   Corwin Levins MD on 10/30/2010   Method used:   Print then Give to Patient   RxID:   [id number] NIASPAN 1000 MG TBCR (NIACIN (ANTIHYPERLIPIDEMIC)) 1 by mouth once daily  #90 x 3   Entered and Authorized by:   Corwin Levins MD   Signed by:   Corwin Levins MD on 10/30/2010   Method used:   Print then Give to Patient   RxID:   [id number] VIAGRA 100 MG TABS (SILDENAFIL CITRATE) 1 by mouth every other day  #60 x 0   Entered and Authorized by:  Corwin Levins MD   Signed by:   Corwin Levins MD on  10/30/2010   Method used:   Print then Give to Patient   RxID:   [id number]    Orders Added: 1)  TLB-PSA (Prostate Specific Antigen) [32440-NUU] 2)  TLB-BMP (Basic Metabolic Panel-BMET) [80048-METABOL] 3)  TLB-CBC Platelet - w/Differential [85025-CBCD] 4)  TLB-TSH (Thyroid Stimulating Hormone) [84443-TSH] 5)  Est. Patient Level IV [72536]

## 2011-01-21 NOTE — Progress Notes (Signed)
Summary: NIACIN  Phone Note From Pharmacy   Caller: [facility] Phone # [phone] ORDER # [id number] Summary of Call: Pharm called. Pt wants brand niacin. Pharm only has the film coated tablets avail in brand name. Pharm need verbal ok from our office to fill rx with film coated tabs.   CALL above # and use order number when calling back.  Initial call taken by: Lamar Sprinkles, CMA,  December 22, 2010 12:51 PM  Follow-up for Phone Call        ok for verbal Follow-up by: Corwin Levins MD,  December 22, 2010 1:10 PM  Additional Follow-up for Phone Call Additional follow up Details #1::        Pharmacy advised of above Additional Follow-up by: Margaret Pyle, CMA,  December 22, 2010 3:59 PM

## 2011-05-04 ENCOUNTER — Other Ambulatory Visit: Payer: Self-pay | Admitting: Internal Medicine

## 2011-05-04 ENCOUNTER — Other Ambulatory Visit (INDEPENDENT_AMBULATORY_CARE_PROVIDER_SITE_OTHER): Payer: Medicare Other | Admitting: *Deleted

## 2011-05-04 DIAGNOSIS — Z79899 Other long term (current) drug therapy: Secondary | ICD-10-CM

## 2011-05-04 DIAGNOSIS — E7800 Pure hypercholesterolemia, unspecified: Secondary | ICD-10-CM

## 2011-05-04 LAB — HEPATIC FUNCTION PANEL
ALT: 20 U/L (ref 0–53)
AST: 23 U/L (ref 0–37)
Bilirubin, Direct: 0.2 mg/dL (ref 0.0–0.3)
Total Bilirubin: 0.9 mg/dL (ref 0.3–1.2)

## 2011-05-04 NOTE — Assessment & Plan Note (Signed)
[name]                            CARDIOLOGY OFFICE NOTE   Larry [name], Larry [name]                        MRN:          [id number]  DATE:08/30/2007                            DOB:          May 13, 1932    PRIMARY CARE PHYSICIAN:  Marcene Duos, M.D.   CLINICAL HISTORY:  Mr. 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  He had bypass  surgery in 1999 at [facility] in [place]. He was admitted to  the hospital in January with chest pain and underwent cardiac  catheterization and was found to have a calcified lesion in the diagonal  branch of the LAD which we elected to treat medically.  He has done  fairly well since that time with no recent symptoms of angina or  shortness of breath or palpitations.  He mows his yard with a push mower  which is fairly vigorous and does not have any symptoms with this.   PAST MEDICAL HISTORY:  Hyperlipidemia with low HDL.   CURRENT MEDICATIONS:  Plavix, aspirin, Niaspan, Imdur, folic acid, fish  oil, Vytorin.   PHYSICAL EXAMINATION:  VITAL SIGNS:  Blood pressure 113/79, pulse 73 and  regular.  NECK:  There was no venous distention.  Carotid pulses were full without  bruits.  CHEST:  Clear.  CARDIAC:  Rhythm was regular.  No murmurs or gallops.  ABDOMEN:  Soft with normal bowel sounds.  There is no  hepatosplenomegaly.  EXTREMITIES:  Peripheral pulses are full and there is no peripheral  edema.   IMPRESSION:  1. Coronary artery disease, status post coronary artery bypass      grafting in 1999 with disease in the diagonal branch of the left      anterior descending, currently stable on medical management.  2. Hyperlipidemia with low HDL.   RECOMMENDATIONS:  I think Mr. Greenleaf is doing well.  His HDL is still  low and we are going to increase his Niaspan from 500 in the morning and  500 in the evening to 500 in the morning and 1 g in the  evening, and  have him take aspirin half an hour before his evening dose to mitigate  side effects.  We will get a followup lipid meta profile  in one month.  He also would like to have treatment for erectile dysfunction.  I think  he can come off the Imdur since he is not having any angina.  I told him  once he stops Imdur, he can take Viagra and gave him a prescription for  that.  I told him he should not overlap nitrates and Viagra within 24  hours.  We will plan to see him back in six months.     [name] Elvera Lennox Juanda Chance, MD, [facility]  Electronically Signed   BRB/MedQ  DD: 08/30/2007  DT: 08/31/2007  Job #: [id number]

## 2011-05-04 NOTE — Assessment & Plan Note (Signed)
[facility]                               LIPID CLINIC NOTE   PAX, REASONER                        MRN:          [id number]  DATE:05/09/2008                            DOB:          04-07-1932    HISTORY:  He is seen in the [facility] for the first time on  May 09, 2008.  [name] comes in today for his initial visit with [place]  here in the lipid clinic.  He is referred to [place] by Dr. Juanda Chance secondary  to persistent high cholesterol, persistent hyperlipidemia and multiple  intolerances to cholesterol medicines.   PAST MEDICAL HISTORY:  1. Coronary artery disease with CABG in 1999.  2. Hyperlipidemia.  3. Some lumbar disk disease.   ALLERGIES:  NO KNOWN DRUG ALLERGIES other than intolerances to multiple  cholesterol medicines including ZOCOR, NIASPAN, ZETIA and LIPITOR.  He  experienced muscle aches, pains with all of these, although of note, he  was on Zocor for approximately 2 years before he developed any myalgias.  With Niaspan, he experienced occasional flushing which was more numerous  at higher doses.   CURRENT MEDICATIONS:  1. His current regimen for cholesterol lowering is Crestor 5 mg every      other day.  2. Niaspan 500 mg at bedtime.  3. Fish oil 1 gm daily.  4. His other medications include Plavix.  5. Multivitamin.  6. Folic acid.  7. Vitamin C.  8. Calcium with vitamin D.  9. Aspirin 81 mg.  10.Viagra p.r.n.   SOCIAL HISTORY:  He does not use any tobacco products.  He drinks  alcoholic beverages only occasionally.   Ever since his heart attack in 1999, he and his wife have done a very  good job of sticking to a heart-healthy diet.  A typical breakfast for  Mr. Esselman is a piece of fruit and one bread whether it is toast, a  bagel or a roll of some sort.  Typical lunches for him are a bowl of  soup or a sandwich or possibly cottage cheese with fruit.  Dinner varies  significantly.  They do eat a lot of chicken  and fish, nonfried.  They  do go out to dinner quite often though and they try to limit portion  sizes by sharing entrees or by ordering items that are lower in fat and  cholesterol.  One of the weaknesses of Mr. Pickrell is snacking.  He  snacks on potato chips frequently.  He does have ice cream occasionally,  but he tries to stick with a lower fat content ice creams.  They eat  fast food very rarely.   Exercise:  As far as exercise, Mr. Shatto walks up to 45 minutes at a  time at least 3 times a week. He has a significant family history of  coronary artery disease with a brother who had an MI at 68.  Another  brother who had an MI and is still living at age 34.  His mother also  had a heart attack when she was 59.  PHYSICAL EXAMINATION:  VITAL SIGNS:  Weight of 182 pounds, blood  pressure 130/80, heart rate 68.   LABORATORY DATA:  Total cholesterol 174, triglycerides 221, HDL 32, LDL  98.  Liver function tests most recently were January 2009 and they were  within normal limits.   ASSESSMENT:  Mr. Lefeber has intolerances to multiple cholesterol-  lowering medicines.  It is very hard to nail down which drug  combinations cause the most problems with myalgias.  He had taken  various combinations of Niaspan with a statin also with Zetia in the  form of Vytorin and also by itself.  However, he is tolerating his  current therapy after the Crestor was reduced from 10 mg a day to 5 mg  every other day.  His cholesterol is not at goal.  His triglycerides are above the goal of less than 150.  HDL is not at  goal greater than 45 and LDL is elevated above the goal of less than 70.   PLAN:  After discussing everything with Mr. Moldovan, we are going to  increase his Niaspan to  1 gm at night.  We talked about the various strategies of preventing  flushing.  He agrees  to stick to those.  After 1 week of the increased  Niaspan dose if he is tolerating it, I asked him to increase his Crestor   dose to 5 mg every day.  He was instructed to call us with any myalgias  concerns in the meantime.  Samples were given to him of Niaspan and  Crestor.  I congratulated him and encouraged him and his wife to  continue with the heart-healthy diet and exercise that they have been  doing.  We scheduled followup here in the clinic in 8 weeks where we  will review repeat liver and lipid panel, and make decisions on drug  therapies after that, possibly starting fibrate if his triglycerides  remain elevated or  increasing the fish oil.      Charolotte Eke, PharmD  Electronically Signed      Duke Salvia, MD, [facility]  Electronically Signed   TP/MedQ  DD: 05/09/2008  DT: 05/09/2008  Job #: [id number]

## 2011-05-04 NOTE — Progress Notes (Signed)
Quick Note:  Voice message left on [employer] system - lab is negative, normal or otherwise stable, pt to continue same tx ______ 

## 2011-05-04 NOTE — Assessment & Plan Note (Signed)
[facility]                               LIPID CLINIC NOTE   Larry [name], Larry [name]                        MRN:          [id number]  DATE:10/24/2008                            DOB:          11/30/1932    [name] comes in today for followup of his hyperlipidemia therapy  which includes Crestor 5 mg daily and Niaspan 1 g daily at bed time.  He  has been compliant with both of these therapies, tolerating them just  fine.  Denies any muscle aches and pains.   PHYSICAL EXAMINATION:  Weight is 185 pounds, blood pressure is 120/80,  heart rate is 68.   LABORATORY DATA:  Total cholesterol 142, triglycerides 153, HDL 31, LDL  80.   ASSESSMENT:  [name] just returned from a trip where he admits to  some dietary indiscretions as well as not much exercise at all.  His  triglycerides today have risen and are just above the goal of less than  150.  His HDL is a little bit worse down to 31 at which time the goal  being greater than 45.  His LDL is exactly the same as previously at 80,  the goal being less than 70.  [name] tries to walk for 45 minutes  daily and his wife went out on vacation, tried to observe a heart  healthy diet.  In the past, [name] has experienced myopathies with  statins.  We have been able to start with a low-dose of Crestor and  increase to 5 mg daily.   PLAN:  We are going to increase Crestor cautiously to 5 mg alternating  with 10 mg every other day.  Now that he is back home, he is going to  getting back on his exercise routine as well as eating at home more and  avoiding high fat, high cholesterol dishes.  We are going to follow up  with him in March with lipid and liver panel to assess this new dose of  Crestor, and he was instructed to call us with any concerns or problems  in the meantime.      Larry [name], PharmD  Electronically Signed      Rollene Rotunda, MD, [facility]  Electronically Signed   TP/MedQ  DD:  10/24/2008  DT: 10/25/2008  Job #: [id number]

## 2011-05-04 NOTE — Assessment & Plan Note (Signed)
[facility]                            CARDIOLOGY OFFICE NOTE   Larry [name], Larry [name]                        MRN:          [id number]  DATE:03/13/2008                            DOB:          [birth year]    PRIMARY CARE PHYSICIAN:  Marcene Duos, M.D.   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  He had bypass  surgery in 1999 at [facility].  In January 2008 he was admitted  to the hospital with chest pain and had catheterization.  Was found to  have his grafts open but he had a moderately high-grade calcified lesion  just before a first moderately large diagonal branch.  We elected  medical therapy and had a nonischemic scan shortly after that.   He had done fairly well since that time but he says he has had increased  symptoms of fatigue over the last couple months related to activity.  He  has had no chest pain or tightness related to this.  He has also had no  palpitations.   PAST MEDICAL HISTORY:  Significant for hyperlipidemia with low HDL.   CURRENT MEDICATIONS:  Plavix, fish oil, aspirin, Niaspan 500 mg daily,  and Crestor 10 mg 1/2 tablet every other day.  He has had trouble with  muscle aches both with Niaspan, Crestor and Zetia and has discontinued  Zetia.   EXAMINATION:  Blood pressure is 125/82 and the pulse 75 and regular.  There is no venous distention.  The carotid pulses were full without  bruits.  CHEST:  Clear without rales or rhonchi.  The cardiac rhythm was regular.  Heart sounds were normal.  There were  no murmurs.  ABDOMEN:  Soft with normal bowel sounds.  There was no  hepatosplenomegaly.  Peripherals were full with no peripheral edema.   An electrocardiogram showed nonspecific ST-T changes.   IMPRESSION:  1. Coronary artery disease, status post coronary bypass graft surgery      in 1999 with coronary anatomy as described  above.  2. Decreased exercise tolerance and exertional fatigue.  3. Hyperlipidemia with low HDL.  4. History of the lumbar disk disease with recent back pain and      buttock pain.   RECOMMENDATIONS:  I am concerned Larry [name] dyspnea could be an  anginal equivalent and will plan to evaluate him with an exercise rest-  stress Myoview scan.  Alternatively, it could be conditioning and could  be related to his back problems.  I suggested he see his primary care  physician about his back problems.  He and his wife suggested having a  LipoMed panel.  I explained that his intolerance to medicine may make  treatment difficult but after some discussion, we felt that if his  LipoMed is compelling, he might be more motivated to try and tolerate  some higher doses of medications and we might consider Symcor, which  might better-tolerated Niaspan.  We will be in touch with him by phone  after we have results and we will put him on the call-up for a year.     [name] Elvera Lennox Juanda Chance, MD, [facility]  Electronically Signed    BRB/MedQ  DD: 03/13/2008  DT: 03/14/2008  Job #: [id number]

## 2011-05-04 NOTE — Assessment & Plan Note (Signed)
[facility]                               LIPID CLINIC NOTE   MATIS, MONNIER                        MRN:          [id number]  DATE:02/20/2009                            DOB:          10-02-1932    Return office visit for Lipid Clinic.   PAST MEDICAL HISTORY:  Hyperlipidemia, coronary artery disease status  post coronary artery bypass graft x3.   FAMILY HISTORY:  Positive for coronary artery disease.   MEDICATIONS:  1. Plavix 75 mg daily.  2. Multivitamin daily.  3. Folic acid 400 mcg daily.  4. Vitamin C 500 mg daily.  5. Calcium with vitamin D twice daily.  6. Fish oil 1 g daily.  7. Aspirin 81 mg daily.  8. Niacin 1000 mg daily.  9. Crestor 5 mg every other day alternating with 10 mg every other      day.   VITAL SIGNS:  183 pounds, blood pressure 110/70 and heart rate 70.   LABORATORY DATA:  Total cholesterol 129, triglyceride 131, LDL 73, HDL  30.  LFTs within normal limits.   ASSESSMENT:  Mr. Dimock is a pleasant 77 year old gentleman who has  history of CAD status post CABG.  On recent followup catheterization, he  had a new 99% blockage in his right LAD, which had previously been  elected to treat medically.  His total cholesterol is at goal of less  than 200, triglycerides at goal of less than 150, LDL greater than goal  of less than 70, HDL less than goal of greater than 40, non HDL at goal  of less than 409.  He does seem to follow a fairly consistent exercise  regimen.  He says that he walks 45 minutes 5-7 days a week.  He says  that during some of the colder days that he did not walk, but even in  some of the snow filled days, he did continue to do his exercise  regimen.  He does state that he does not follow a consistent heart-  healthy diet.  He likes to snack on chips, cake, cookies, etc.  He does  feel like he has increased his portion sizes for the last several months  fairly beginning with the Thanksgiving holiday.   He does realize that  his LDL is still above goal of less than 70 putting him at increased  risk of ongoing heart disease as well as a drop in his HDL.  He want to  be compliant with lifestyle modification.  He will continue to do his  exercise.  He states that he does walk, have a very vigorous walk  whenever he does, do his exercise as well as very interested in  decreasing the fats in his diet.  His wife says that she will help  monitor his portion sizes and prevent him from getting seconds in their  evening meal.  He also is willing to increase the fiber as well in his  diet as well as add some almonds instead of potatoes chips for his  snacks to help increase his HDL.  We also had a conversation about changing his medications.  He did have  flushing and hot flashes with higher doses of niacin which he is not  extremely interested in resuming at this time or he is not interested in  increasing the dose at this time.  He seems to be tolerating his Crestor  well and is willing to increase that to 10 mg a day.   PLAN:  1. Increase Crestor to 10 mg daily.  2. The possibility of increasing niacin in the future.  3. Decrease the fats in diet.  4. Increase fiber.  5. Continue exercise.  6. Followup visit in 4 months for lipid panel and LFTs.  We will make      adjustments at that time.      Leota Sauers, PharmD  Electronically Signed      Jesse Sans. Daleen Squibb, MD, [facility]  Electronically Signed   LC/MedQ  DD: 02/20/2009  DT: 02/21/2009  Job #: [id number]

## 2011-05-04 NOTE — Assessment & Plan Note (Signed)
[facility]                               LIPID CLINIC NOTE   Larry [name], Larry [name]                        MRN:          [id number]  DATE:07/04/2008                            DOB:          23-Apr-1932    SUMMARY:  The patient is seen with Mrs. Bowery in the Lipid Clinic today  for evaluation of medication titration associated with his  hyperlipidemia.  He has persistent high cholesterol and multiple  intolerances to hypercholesterolemia therapies in the past.  He has past  medical history pertinent for documented coronary artery disease, status  post CABG in 1999.  Positive family history.   Drug allergies include no known drug allergies.  Intolerances include  multiple lipid-lowering therapies in the past including Niaspan, Zocor,  Zetia, and Lipitor.  Muscle ache, pain, weakness, and fatigue develops  without these, although he was able to tolerate Zocor for 2 years prior  to myalgias.  Occasional flushing with Niaspan did occur, though he is  now able to tolerate 1000 mg daily.  He was experiencing higher doses in  the past.   CURRENT MEDICATIONS:  1. Plavix 75 mg daily.  2. Centrum silver daily.  3. Folic acid daily.  4. Vitamin C 500 mg daily.  5. Os-Cal with D twice daily.  6. Fish oil 1200 mg daily.  7. Enteric-coated aspirin 81 mg daily.  8. Niaspan 1000 mg daily at bedtime.  9. Crestor 5 mg daily.  10.Sildenafil 50 mg as needed.   REVIEW OF SYSTEMS:  As stated in the HPI and otherwise negative.   PHYSICAL EXAMINATION:  VITAL SIGNS:  Weight today is 183 pounds, blood  pressure is 110/72, and his heart rate is 64.   LABORATORY DATA:  On May 28, 2008, reveal total bilirubin of 1.4,  slightly elevated alkaline phosphatase 35, slightly below normal range.  All other LFTs are within normal limits and a lipid profile revealing  total cholesterol of 139, triglycerides 125, HDL 34.1, and LDL 80.   ASSESSMENT:  The patient has made good  improvement towards his goals on  his increased therapy of Crestor 5 mg daily.  I have encouraged him to  continue on this therapy for now and we will probably in the future even  consider an increase to 10 mg if he continues to be able to tolerated  this therapy.  I have discussed with him or it has come up in our  conversation that the patient's daughter works for [employer] and I  have talked with him about recheck this panel in  6 to 12 months, but otherwise, continuing as he is.  The patient will  continue to work on diet and exercise therapy.  He will call with  questions or problems in the meantime.      Shelby Dubin, PharmD, BCPS, CPP  Electronically Signed      Noralyn Pick. Eden Emms, MD, [facility]  Electronically Signed   MP/MedQ  DD: 07/04/2008  DT: 07/05/2008  Job #: [id number]   cc:   Everardo Beals. Juanda Chance, MD, [facility]

## 2011-05-05 LAB — NMR, LIPOPROFILE

## 2011-05-06 ENCOUNTER — Ambulatory Visit (INDEPENDENT_AMBULATORY_CARE_PROVIDER_SITE_OTHER): Payer: Medicare Other | Admitting: Pharmacist

## 2011-05-06 VITALS — BP 122/85 | Wt 182.0 lb

## 2011-05-06 DIAGNOSIS — E785 Hyperlipidemia, unspecified: Secondary | ICD-10-CM

## 2011-05-06 NOTE — Progress Notes (Signed)
Patient returns for dyslipidemia follow up. He has no medication complaints at this time. Current lipid regimen includes: Crestor 10 mg in which he alternates 1 tablet and a half tablet, fish oil 2000 mg daily, and Niaspan 1000 mg daily. Exercise habits include: walking 4x/week for 45 minutes and 1x/week he cuts the grass which takes 3 hours. His diet has not changed since last visit. 24 hour recall: breakfast-shredded wheat with 1/2 banana, strawberries, and coffee, lunch-chicken salad, bow tie pasta and diet coke, dinner- baked chicken, small amount of dressing, and sweet potatoes. He continues to struggle with the amount of sweets and snacks he is eating. He thoroughly enjoys potato chips, cookies 2/day, and ice cream.

## 2011-05-06 NOTE — Patient Instructions (Signed)
1) continue exercising 2.) Try to cut some of your sweets. Treat yourself to them once/week 3.) Will call you with your results on your lab and instructions with medications

## 2011-05-07 ENCOUNTER — Encounter: Payer: Self-pay | Admitting: Internal Medicine

## 2011-05-07 NOTE — H&P (Signed)
Larry [name], Larry [name] NO.:  [id number]   MEDICAL RECORD NO.:  [id number]          PATIENT TYPE:  EMS   LOCATION:  MAJO                         FACILITY:  [facility]   PHYSICIAN:  Larry [name]. Larry Chance, MD, FACCDATE OF BIRTH:  23-Mar-1932   DATE OF ADMISSION:  01/10/2007  DATE OF DISCHARGE:                              HISTORY & PHYSICAL   PRIMARY CARDIOLOGIST:  Dr. Charlies [name].   PRIMARY CARE PHYSICIAN:  Primary care physician is new.  It will be Dr.  Oliver [name].   [name] is a very pleasant 77 year old Caucasian gentleman with known  history of coronary artery disease, status post coronary artery bypass  grafting in 1999 in [place], [place].  The patient apparently had three-  vessel bypass at that time with alignment to the LAD, saphenous vein  graft to the right coronary, other graft unknown at this time.  Mr.  Larry [name] is followed by Dr. Juanda [name], who last saw him in May 2007, at which  time the patient was doing really well.  He continued to play  racquetball, walk actively, maintain his own lawn, and had never had any  complaints of shortness of breath or angina-type symptoms until this  date.  He states around 4 o'clock this morning, he woke up to go the  bathroom.  He states he just felt dizzy.  He felt that he was having a  moving sensation, not so much that the room was moving, but that he was.  He got up, he felt like he was staggering.  He became very diaphoretic,  slightly nauseated.  Went to the bathroom, urinated, continued to feel  poorly.  He staggered back to bed, states he laid there for a little  while and finally fell asleep.  He woke up around 7:00 a.m.  He states  he was very diaphoretic at that time, head completely soaked.  The  dizziness continued.  No shortness of breath.  That is when the chest  discomfort started.  He states it was left chest pressure and some left  arm tenderness.  He states the left arm tenderness almost felt tender to  touch.  He rated the chest discomfort as 1.  His wife woke up, noticed  he was very clammy and diaphoretic with nausea.  Larry [name] brought her  husband to the emergency room for further evaluation.  Currently still  rating his pressure around a 1.  He states he is not dizzy now, but he  just does not feel right.   ALLERGIES:  Intolerance to Zocor.   MEDICATIONS:  Include:  1. Zetia 10.  2. Niaspan 500 b.i.d.  3. Aspirin 325.  4. Folic acid.  5. Multivitamins.   PAST MEDICAL HISTORY:  Includes non-ST elevated MI in 1999, cardiac  catheterization at that time, status post CABG in [place], [place],  hyperlipidemia with intolerance to the Zocor, back surgery, thyroid  nodule removed secondary to tumor at age 69, cataract surgery.  Last  stress test was done at our office in 2006.  The patient had a normal  EJECTION FRACTION.  The  patient had nontransmural scarring inferiorly  and inferior septal with no demonstrable ischemia.  When compared to  prior study from 07/2004, no significant interval change.   SOCIAL HISTORY:  He lives in [place] with his wife.  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  He has never used tobacco.  Has an  occasional beer.  Denies ever using drugs or herbal medication.  He  tries to follow a heart healthy diet.  He walks 45 minutes 3 or 4 times  a week.   FAMILY HISTORY:  Mother deceased age 89, had a history of coronary  artery disease.  Father deceased at age 81 secondary to a brain tumor.  Siblings:  Brother deceased at age 42 with an MI, also had a brother who  had his first MI at age 65 and is status post CABG.   REVIEW OF SYSTEMS:  Positive for sweats, chest discomfort, dizziness,  and nausea.  All other systems negative.   PHYSICAL EXAMINATION:  VITAL SIGNS:  Temperature 98.9, pulse 67,  respirations 20, blood pressure 149/85.  The patient's sat 98% on 2  liters.  GENERAL: He is in no acute distress.  HEENT:  Normocephalic,  atraumatic.  Pupils equal, round, reactive to  light.  Sclerae is clear.  NECK: Supple without lymphadenopathy.  Negative bruit.  Negative JVD.  CARDIOVASCULAR:  Exam reveals a regular rate and rhythm.  S1-S2.  Pulses  are 2+ and equal without bruits.  LUNGS: Clear to auscultation bilaterally.  ABDOMEN: Soft, nontender, positive bowel sounds.  EXTREMITIES: Without clubbing, cyanosis, or edema.  NEUROLOGIC: The patient alert and oriented x3.  Cranial nerves II-XII  grossly intact.   Chest x-ray is pending.  EKG:  Sinus rhythm at a rate 67 without acute  ST or T-wave changes.   INITIAL LABORATORY DATA:  H&H 16.3 and 48.  Sodium 140, potassium 3.9,  BUN 17, creatinine 1.1 with a glucose 120.  Point of care markers:  Troponin less than 0.05.  CK-MB less than 1.  PT 13.2, INR 1.   Dr. Charlies [name] in to examine and assess patient with complaints of  chest discomfort consistent with unstable angina.  Known history of  coronary artery disease status post bypass in 1999.  The patient will  need cardiac catheterization to evaluate grafts.  Will load the patient  with 600 Plavix, 3000 units heparin bolus, aspirin, low-dose beta  blocker, and plan on cardiac catheterization this morning.  The risks  and benefits have been discussed with the patient.  The patient agrees  to proceed with cath.      Larry [name], ACNP      Larry R. Larry Chance, MD, [facility]  Electronically Signed    MB/MEDQ  D:  01/10/2007  T:  01/10/2007  Job:  [id number]

## 2011-05-07 NOTE — Assessment & Plan Note (Signed)
[facility]                            CARDIOLOGY OFFICE NOTE   CASH, DUCE                        MRN:          [id number]  DATE:01/16/2007                            DOB:          December 29, 1931    CLINICAL HISTORY:  Mr. 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  He had a non-Q-wave myocardial infarction  followed by bypass surgery in 1999 in [place] and moved here in 2001.  He was recently admitted to the hospital with chest pain, underwent  catheterization.  His grafts were widely patent but he had a lesion in  the LAD extending into the first diagonal branch that was thought was  probably the culprit.  It was heavily calcified and not a real large  vessel and we elected to plan initial medical therapy with plans for an  outpatient Myoview.  He had this done today and it does not show any  ischemia in the diagonal distribution.  He has had no symptoms of chest  pain since his discharge from the hospital.   PAST MEDICAL HISTORY:  Significant for hyperlipidemia with a low HDL.  He has been INTOLERANT OF ZOCOR in the past and is managed on Zetia and  Niaspan.   CURRENT MEDICATIONS:  Include Plavix, aspirin, Zetia, Niaspan, Imdur,  which is new, and fish oil.   EXAMINATION:  The blood pressure is 126/90 and the pulse 75 and regular.  There was no venous distention.  The carotid pulses were full without  bruits.  CHEST:  Clear.  CARDIAC:  Rhythm was regular.  The heart sounds were normal.  There were  no murmurs or gallops.  ABDOMEN:  Soft with normal bowel sounds.  There is no  hepatosplenomegaly.  The femoral site had rather prominent ecchymoses.  There was a small  knot at the entry site.  There was no widened pulsation or bruits.  Peripheral pulses were full.  There was no peripheral edema.   IMPRESSION:  1. Coronary artery disease status post coronary artery bypass graft      surgery, 1999, in  [place].  2. Recent admission for unstable angina with coronary anatomy as      described above, now stable on medical therapy.  3. Hyperlipidemia with low HDL.   RECOMMENDATIONS:  The lesion in the diagonal branch is a fairly small  vessel and the patient has had no recurrent symptoms and has no ischemia  on Myoview testing.  We will plan continue medical management with the  Imdur.  He has had some mild headache with the Imdur but this appears to  be improving.  He is currently not on a statin and was intolerant of  Zocor, but I think we should try one of the other statins and he is  agreeable to this.  We will start him on Crestor 10 mg a day.  We will  get a lipid and liver profile in a month and, if his LDL is way below  target, then we will probably stop the Zetia and continue the  Niaspan.  HE IS NOT ON A BETA  BLOCKER DUE TO INTOLERANCE RELATED TO LOW BLOOD PRESSURES.  We will  continue his other medication.  I will plan to see him back in followup  in 8 weeks.     [name] Elvera Lennox Juanda Chance, MD, [facility]  Electronically Signed    BRB/MedQ  DD: 01/16/2007  DT: 01/16/2007  Job #: [id number]

## 2011-05-07 NOTE — Cardiovascular Report (Signed)
Larry [name], MAHLER NO.:  [id number]   MEDICAL RECORD NO.:  [id number]          PATIENT TYPE:  INP   LOCATION:  2807                         FACILITY:  [facility]   PHYSICIAN:  Everardo Beals. 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  He had coronary bypass surgery in 1999 at [facility] in [place] with a LIMA to the LAD and a vein graft to the  right coronary artery.  He has done well since that time.  Last night he  awoke with dizziness and then diaphoresis and this morning he developed  chest tightness and came to the emergency room.  He is still having some  persistent chest tightness on arrival.  We made a decision to bring him  to the catheterization laboratory for angiography.   PROCEDURE:  The procedure was performed via the right femoral artery  using an arterial sheath and 6 French preformed coronary catheters.  A  front wall arterial puncture was performed and Omnipaque contrast was  used.  A LIMA catheter used for injection of the LIMA graft.  We entered  the RIMA graft thinking that the RIMA graft had been used but found that  it had not.  The right femoral artery was closed with Angio-Seal at the  end of the procedure.  The patient tolerated the procedure well and left  the laboratory in satisfactory condition.   RESULTS:  The aortic pressure was 133/81 with a mean of 106 and the left  ventricular pressure was 133/9.   Left main coronary artery:  The left main coronary artery was free of  significant disease.   Left anterior descending artery:  The left anterior descending artery  gave rise to a septal perforator and a diagonal branch, then had  competing flow distally.  There was 80% stenosis in the proximal LAD  before the diagonal branch,  and there was a 90% focal stenosis just  before the diagonal branch.   The circumflex artery:  The circumflex artery gave rise to two early  marginal branches, a third small marginal branch, and a moderate-sized  posterolateral branch.  These vessels were free of significant disease.   The right coronary artery:  The right coronary artery was completely  occluded in its midportion after a right ventricular branch.   The saphenous vein graft to the posterior descending branch of the right  coronary artery was patent and functioned normally.  This graft fed the  posterior descending branch and two posterolateral branches through the  PDA.   The LIMA graft to the LAD was patent and functioned normally.   Left ventriculogram:  The left ventriculogram performed in the RAO  projection showed good wall motion but no areas of hypokinesis.  The  estimated ejection fraction was 60%.   CONCLUSION:  1. Coronary artery disease, status post prior coronary bypass graft  surgery in 1999.  2. Severe native vessel disease with 80 and 90% proximal stenosis in      the left anterior descending artery, no major obstruction of the      circumflex artery, and total occlusion of mid right coronary      artery.  3. Patent vein graft to the posterior descending branch of the right      coronary artery and patent left internal mammary artery graft to      the left anterior descending artery.  4. Normal left ventricular function.   RECOMMENDATIONS:  Both grafts are widely patent.  The patient has a  tight lesion in the proximal LAD just before a moderate-sized diagonal  branch, which is the likely culprit vessel accounting for the patient's  symptoms.  This area has moderate calcification and probably would  require rotational atherectomy to treat.  I will review this with my  colleagues before making a final decision whether to proceed with PCI or  consider medical therapy or consider further  evaluation with a Myoview  scan to help in the decision.      Larry Elvera Lennox Juanda Chance, MD, [facility]  Electronically Signed     BRB/MEDQ  D:  01/10/2007  T:  01/10/2007  Job:  [id number]   cc:   Larry [name], M.D.  Cardiopulmonary Lab

## 2011-05-07 NOTE — Assessment & Plan Note (Signed)
[facility]                            CARDIOLOGY OFFICE NOTE   Larry [name], Larry [name]                        MRN:          [id number]  DATE:04/25/2007                            DOB:          23-Aug-1932    Primary care physician is Marcene Duos, M.D., with [facility]  [facility] at [facility].   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  He had bypass  surgery in 1999 up in [place].  He was admitted to the hospital in  January with chest pain and underwent catheterization and was found to  have patent grafts but had a heavily calcified lesion in the diagonal  branch of the LAD, which was not grafted.  We elected to treat this  medically and he subsequently had a Myoview scan, which was nonischemic.  He has done quite well since that time, has had no recent chest pain,  shortness of breath or palpitations.   He also had hyperlipidemia and he brought in a panel of his lipid  profile, which showed that his total cholesterol went down from 161 to  99 and his LDL went down from 91 to 50 with the addition of Crestor, but  his HDL fell from 44 to 32 and his triglycerides increased from 164 to  252.  He does say that he has eaten a fair amount of carbs in his diet  recently.   PAST MEDICAL HISTORY:  Significant for the hyperlipidemia and low HDL.  There is a question of intolerance to Zocor in the past, but he  indicated that he wanted to try Vytorin.  He had been on Zetia in the  past.   CURRENT MEDICATIONS:  Plavix, Niaspan, Ecotrin, Imdur, fish oil and  Crestor.   PHYSICAL EXAMINATION:  VITAL SIGNS:  Blood pressure is 124/86 and pulse  70 and regular.  NECK:  There was no venous distention.  Carotid pulses were full without  bruits.  CHEST:  Clear.  CARDIAC:  Cardiac rhythm was regular.  There were no murmurs or gallops.  ABDOMEN:  Soft with normal bowel  sounds.  There was no  hepatosplenomegaly.  EXTREMITIES:  Peripheral pulses were full, and there was no peripheral  edema.   An electrocardiogram showed sinus rhythm and nonspecific ST-T changes.   IMPRESSION:  1. Coronary artery disease, status post coronary bypass graft surgery      in [place] in 1999 with coronary anatomy at catheterization in      January 2008 as described above, now stable.  2. Hyperlipidemia with low HDL.   RECOMMENDATIONS:  I think Larry [name] is doing well at present.  His low  HDL is a little confusing, although it could be related to dietary  indiscretion with high carbohydrates.  He says he would like to go on  Vytorin as opposed to Crestor, and I told him I thought both drugs were  excellent.  We will plan to repeat his lipid profile in 2 months, at  which time he will  run out of his Crestor and then switch him over to  Vytorin 10/40 mg.  We will get another lipid profile 2 months  after he is on the drug, and I will see him back in 4 months after that.  He has had some bruising.  I told him he could cut back his aspirin from  325 mg to 81 mg a day.     [name] Elvera Lennox Juanda Chance, MD, [facility]  Electronically Signed    BRB/MedQ  DD: 04/25/2007  DT: 04/26/2007  Job #: [id number]

## 2011-05-07 NOTE — Discharge Summary (Signed)
[name], [name] NO.:  [id number]   MEDICAL RECORD NO.:  [id number]          PATIENT TYPE:  INP   LOCATION:  2928                         FACILITY:  [facility]   PHYSICIAN:  [name] R. Juanda Chance, MD, FACCDATE OF BIRTH:  01/09/1932   DATE OF ADMISSION:  01/10/2007  DATE OF DISCHARGE:  01/11/2007                               DISCHARGE SUMMARY   PROCEDURES:  1. Cardiac catheterization.  2. Coronary arteriogram.  3. Left ventriculogram.  4. LIMA arteriogram.  5. Graft angiogram.   PRIMARY DIAGNOSES:  Acute coronary syndrome.   SECONDARY DIAGNOSES:  1. Status post aortocoronary bypass surgery in 1999 in [place] with      LIMA to LAD and SVG to RCA.  2. Non-ST segment elevation MI in 1999 pre-bypass.  3. Hyperlipidemia.  4. History of thyroid nodule and removal secondary to tumor.  5. History of cataract surgery.  6. Preserved left ventricular function with an EF of 60% at      catheterization.   ALLERGIES:  ALLERGY OR INTOLERANCE TO ZOCOR.   FAMILY HISTORY:  1. Coronary artery disease in his mother and siblings.  2. Hypotension this admission with a systolic blood pressure of 90 to      100, asymptomatic.  3. Minimally elevated total bilirubin at 1.5 with SGOT, SGPT and      alkaline phosphatase within normal limits were slightly low.   Time at discharge 33 minutes.   HOSPITAL COURSE:  [name] is a 77 year old male with a history of  coronary artery disease.  He has been active and doing very well, but at  4 o'clock in the morning he woke up to go to the bathroom and had  dizziness as well as diaphoresis, and then developed chest discomfort  about 3 hours later.  He was also nauseated.  He came to the emergency  room where he was admitted for further evaluation and treatment.   Dr. Juanda Chance was concerned for acute coronary syndrome and took him to the  cath lab.   The cardiac catheterization showed a patent LIMA to LAD and a patent SVG  to PDA.  There  was no significant distal disease.  The circumflex had no  critical stenosis.  However, the LAD had a proximal 80 and then 90%  stenosis.  Dr. Juanda Chance felt that this was the culprit lesion.  He felt  that it had moderate calcium and would probably require high-speed  rotational atherectomy.  The situation was discussed with his colleagues  and with the patient and his family.  The plan is for outpatient Myoview  and readmission for high-speed rotational atherectomy and percutaneous  intervention, if it is abnormal.   On January 11, 2007, [name] had been started on Imdur.  He had no  more episodes of chest pain.  His right groin had some ecchymosis and a  small hematoma, but no bruit was noted.  His lungs were clear to  auscultation.  He had been started on Imdur and received this.  After  the Imdur, systolic blood pressure was 90-100, so a beta blocker which  had been added was never given.  A lipid profile was checked which  showed a total cholesterol 129, triglycerides 62, HDL 38, LDL 79.  He is  to continue on the Zetia and Niaspan, as prior to admission.   [name] is currently on Heparin.  It is to be discontinued, and he is  to ambulate.  If he can ambulate without chest pain or shortness of  breath, he is tentatively considered stable for discharge with  outpatient stress testing and followup with Dr. Juanda Chance.   DISCHARGE INSTRUCTIONS:  His activity level is to be increased  gradually.  He is to not to do any lifting for week and no driving for 2  days.  He is to call our office for problems with the cath site.  He has  a stress test on January 28th at 7:15, and he is to follow up Dr. Juanda Chance  on the same date at 10:15.  He is to follow up with Dr. Oliver Barre for  primary care.   DISCHARGE MEDICATIONS:  1. Coated aspirin 325 mg daily.  2. Plavix 75 mg daily.  3. Imdur 30 mg daily.  4. Folic acid 1 mg daily.  5. Zetia 10 mg a day.  6. Niacin 5 mg daily.  7. Multivitamin  daily.      Theodore Demark, PA-C      [name] R. Juanda Chance, MD, [facility]  Electronically Signed    RB/MEDQ  D:  01/11/2007  T:  01/11/2007  Job:  [id number]   cc:   Corwin Levins, MD

## 2011-05-10 NOTE — Assessment & Plan Note (Addendum)
Pt's cholesterol remains relatively controlled since last visit.  TC- 121 (goal<200), TG slightly elevated at 195 (goal<150), HDL-C- 35 (goal>40), LDL-C- 47 (goal<70), and LDL-P- 1064 (goal<1000).  LFTs are WNL.  Will continue current medications for now (Pt on Crestor and Niaspan currently).  Encouraged diet and lifetstyle changes to help decrease TG.  If these remain elevated, will consider increasing Niaspan or adding fish oil at next visit.  Will f/u in 6 months.

## 2011-05-27 ENCOUNTER — Encounter: Payer: Self-pay | Admitting: Cardiovascular Disease

## 2011-07-01 ENCOUNTER — Encounter: Payer: Self-pay | Admitting: Cardiovascular Disease

## 2011-07-01 ENCOUNTER — Ambulatory Visit (INDEPENDENT_AMBULATORY_CARE_PROVIDER_SITE_OTHER): Payer: Medicare Other | Admitting: Cardiovascular Disease

## 2011-07-01 VITALS — BP 132/80 | HR 79 | Ht 67.0 in | Wt 182.0 lb

## 2011-07-01 DIAGNOSIS — R5383 Other fatigue: Secondary | ICD-10-CM

## 2011-07-01 DIAGNOSIS — I2510 Atherosclerotic heart disease of native coronary artery without angina pectoris: Secondary | ICD-10-CM

## 2011-07-01 DIAGNOSIS — R5381 Other malaise: Secondary | ICD-10-CM

## 2011-07-01 LAB — BASIC METABOLIC PANEL
CO2: 28 mEq/L (ref 19–32)
Chloride: 100 mEq/L (ref 96–112)
Potassium: 4 mEq/L (ref 3.5–5.1)
Sodium: 139 mEq/L (ref 135–145)

## 2011-07-01 LAB — CBC WITH DIFFERENTIAL/PLATELET
Basophils Relative: 0.4 % (ref 0.0–3.0)
Eosinophils Absolute: 0.2 10*3/uL (ref 0.0–0.7)
Hemoglobin: 16.8 g/dL (ref 13.0–17.0)
MCHC: 34.4 g/dL (ref 30.0–36.0)
MCV: 95.2 fl (ref 78.0–100.0)
Monocytes Absolute: 0.8 10*3/uL (ref 0.1–1.0)
Neutro Abs: 3.9 10*3/uL (ref 1.4–7.7)
RBC: 5.14 Mil/uL (ref 4.22–5.81)

## 2011-07-01 NOTE — Patient Instructions (Signed)
Your physician wants you to follow-up in: 6 months with Dr. Clifton James. You will receive a reminder letter in the mail two months in advance. If you don't receive a letter, please call our office to schedule the follow-up appointment.  Your physician has requested that you have en exercise stress myoview. For further information please visit [email or web address] Please follow instruction sheet, as given.  You had lab work done today.

## 2011-07-01 NOTE — Progress Notes (Signed)
History of Present Illness:Larry [name] with history of CAD, HL here for cardiac follow up. He has been followed in the past by Dr. Juanda Chance.  He had 3V CABG in 1999 at [facility] in [place]. In 2008 he had a catheterization and was found to have a patent LIMA to LAD and a patent vein graft to the right coronary and no significant obstruction in the circumflex artery. He has been doing well since that time. He has had no chest pain. There has been some shortness of breath and constant fatigue. No palpitations. No energy to do things. No recent ischemic evaluation.   Last lipid panel showed an HDL of 36 and LDL of 47 and triglyceride that was mildly elevated. This is followed in our lipid clinic.   Past Medical History  Diagnosis Date  . History of colonic polyps   . ED (erectile dysfunction)   . Diverticulosis of colon   . CAD (coronary artery disease)     S/P CABG in 1999   . Hyperlipidemia     Low HDL  . Decreased exercise tolerance     Exertional fatigue  . Lumbar disc disease     Hx of with recent back pain and buttock pain    Past Surgical History  Procedure Date  . Coronary artery bypass graft 1999  . Cataract extraction   . Ptca   . Tonsillectomy   . Vasectomy   . Nodules on thyroid   . Lumbar disc surgery     Current Outpatient Prescriptions  Medication Sig Dispense Refill  . acetaminophen (TYLENOL) 650 MG CR tablet Take 650 mg by mouth every 8 (eight) hours as needed.        Marland Kitchen aspirin 81 MG EC tablet Take 81 mg by mouth daily.        . calcium-vitamin D (OSCAL WITH D) 500-200 MG-UNIT per tablet Take 1 tablet by mouth daily.        . clopidogrel (PLAVIX) 75 MG tablet Take 75 mg by mouth daily.        . fish oil-omega-3 fatty acids 1000 MG capsule Take 2 g by mouth daily.        . folic acid (FOLVITE) 400 MCG tablet Take 400 mcg by mouth daily.        . Multiple Vitamins-Minerals (CENTRUM SILVER ULTRA MENS PO) Take 1 tablet by mouth daily.        . niacin (NIASPAN)  1000 MG CR tablet Take 1,000 mg by mouth at bedtime.       . rosuvastatin (CRESTOR) 10 MG tablet Take 10 mg by mouth daily. Alternate 1 tablet and a half tablet       . sildenafil (VIAGRA) 100 MG tablet Take 100 mg by mouth daily as needed.          Allergies  Allergen Reactions  . Beta Adrenergic Blockers     REACTION: low blood pressures  . Simvastatin     REACTION: myalgias    History   Social History  . Marital Status: Married    Spouse Name: N/A    Number of Children: N/A  . Years of Education: N/A   Occupational History  . 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 Smoking status: Never Smoker   . Smokeless tobacco: Not on file  . Alcohol Use: No  . Drug Use: No  . Sexually Active: Not on file   Other Topics Concern  .  Not on file   Social History Narrative   [place]    Family History  Problem Relation Age of Onset  . Prostate cancer Other   . Prostate cancer Other     Review of Systems:  As stated in the HPI and otherwise negative.   BP 132/80  Pulse 79  Ht 5\' 7"  (1.702 m)  Wt 182 lb (82.555 kg)  BMI 28.51 kg/m2  Physical Examination: General: Well developed, well nourished, NAD HEENT: OP clear, mucus membranes moist SKIN: warm, dry. No rashes. Neuro: No focal deficits Musculoskeletal: Muscle strength 5/5 all ext Psychiatric: Mood and affect normal Neck: No JVD, no carotid bruits, no thyromegaly, no lymphadenopathy. Lungs:Clear bilaterally, no wheezes, rhonci, crackles Cardiovascular: Regular rate and rhythm. No murmurs, gallops or rubs. Abdomen:Soft. Bowel sounds present. Non-tender.  Extremities: No lower extremity edema. Pulses are 2 + in the bilateral DP/PT.  ZOX:WRUEA rhythm, rate 79 bpm. PVCs. Non-specific ST and T wave changes.

## 2011-07-01 NOTE — Assessment & Plan Note (Signed)
He is on good medications. No chest pain but recent SOB and fatigue. Will arrange Exercise myoview to exclude ischemia. Will also check BMET, CBC and TSH today.

## 2011-07-07 ENCOUNTER — Ambulatory Visit (HOSPITAL_COMMUNITY): Payer: Medicare Other | Attending: Cardiovascular Disease | Admitting: Radiology

## 2011-07-07 VITALS — Ht 67.0 in | Wt 179.0 lb

## 2011-07-07 DIAGNOSIS — I25810 Atherosclerosis of coronary artery bypass graft(s) without angina pectoris: Secondary | ICD-10-CM

## 2011-07-07 DIAGNOSIS — I491 Atrial premature depolarization: Secondary | ICD-10-CM

## 2011-07-07 DIAGNOSIS — R0602 Shortness of breath: Secondary | ICD-10-CM

## 2011-07-07 DIAGNOSIS — R5383 Other fatigue: Secondary | ICD-10-CM | POA: Insufficient documentation

## 2011-07-07 DIAGNOSIS — I4949 Other premature depolarization: Secondary | ICD-10-CM

## 2011-07-07 DIAGNOSIS — R5381 Other malaise: Secondary | ICD-10-CM | POA: Insufficient documentation

## 2011-07-07 DIAGNOSIS — I2510 Atherosclerotic heart disease of native coronary artery without angina pectoris: Secondary | ICD-10-CM | POA: Insufficient documentation

## 2011-07-07 MED ORDER — TECHNETIUM TC 99M TETROFOSMIN IV KIT
33.0000 | PACK | Freq: Once | INTRAVENOUS | Status: AC | PRN
  Administered 2011-07-07: 33 via INTRAVENOUS

## 2011-07-07 MED ORDER — TECHNETIUM TC 99M TETROFOSMIN IV KIT
11.0000 | PACK | Freq: Once | INTRAVENOUS | Status: AC | PRN
  Administered 2011-07-07: 11 via INTRAVENOUS

## 2011-07-07 NOTE — Progress Notes (Signed)
[facility] SITE 3 NUCLEAR MED [address] [place] [address] [phone]  Cardiology Nuclear Med Larry [name]  Larry [name] is a 77 y.o. male [id number] 1932/10/23   Nuclear Med Background Indication for Stress Test:  Evaluation for Ischemia and Graft Patency History: '99 CABG x2, 01/08 Heart Catheterization: EF 60% patent grafts, '99 Myocardial Infarction: NSTEMI--CABG and 05/09 Myocardial Perfusion Study: EF 51% Cardiac Risk Factors: Family History - CAD, Hypertension and Lipids  Symptoms:  DOE, Fatigue, Fatigue with Exertion, Palpitations and SOB   Nuclear Pre-Procedure Caffeine/Decaff Intake:  None NPO After: 7:00pm   Lungs:  clear IV 0.9% NS with Angio Cath:  20g  IV Site: R Antecubital  IV Started by:  Irean Hong, RN  Chest Size (in):  44 Cup Size: n/a  Height: 5\' 7"  (1.702 m)  Weight:  179 lb (81.194 kg)  BMI:  Body mass index is 28.04 kg/(m^2). Tech Comments:  n/a    Nuclear Med Study 1 or 2 day study: 1 day  Stress Test Type:  Stress  Reading MD: Marca Ancona, MD  Order Authorizing Provider:  C.McAlhany  Resting Radionuclide: Technetium 67m Tetrofosmin  Resting Radionuclide Dose: 11.0 mCi   Stress Radionuclide:  Technetium 108m Tetrofosmin  Stress Radionuclide Dose: 33.0 mCi           Stress Protocol Rest HR: 65 Stress HR: 129  Rest BP: 117/83 Stress BP: 140/86  Exercise Time (min): 6:00 METS: 7.0   Predicted Max HR: 144 bpm % Max HR: 89.58 bpm Rate Pressure Product: 21308   Dose of Adenosine (mg):  n/a Dose of Lexiscan: n/a mg  Dose of Atropine (mg): n/a Dose of Dobutamine: n/a mcg/kg/min (at max HR)  Stress Test Technologist: Milana Na, EMT-P  Nuclear Technologist:  Domenic Polite, CNMT     Rest Procedure:  Myocardial perfusion imaging was performed at rest 45 minutes following the intravenous administration of Technetium 77m Tetrofosmin. Rest ECG: NSR  Stress Procedure:  The patient exercised for 6:00.  The patient  stopped due to fatigue, sob, and denied any chest pain.  There were no significant ST-T wave changes and occ pvcs/pacs.  Technetium 68m Tetrofosmin was injected at peak exercise and myocardial perfusion imaging was performed after a brief delay. Stress ECG: No significant change from baseline ECG  QPS Raw Data Images:  Normal; no motion artifact; normal heart/lung ratio. Stress Images:  Moderate basal to mid inferior perfusion defect.  Rest Images:  Moderate basal to mid inferior perfusion defect.  Subtraction (SDS):  Fixed basal to mid inferior perfusion defect.  Transient Ischemic Dilatation (Normal <1.22):  0.96 Lung/Heart Ratio (Normal <0.45):  0.36  Quantitative Gated Spect Images QGS EDV:  102 ml QGS ESV:  52 ml QGS cine images:  Mild global hypokinesis.  QGS EF: 50%  Impression Exercise Capacity:  Fair exercise capacity. BP Response:  Normal blood pressure response. Clinical Symptoms:  Shortness of breath, no chest pain.  ECG Impression:  Baseline slight ST depression in anterolateral leads, no significant change with exertion.  Comparison with Prior Nuclear Study: No significant change from previous study  Overall Impression:  Low risk stress nuclear study.  Moderate fixed basal to mid inferior perfusion defect suggestive of prior MI. No evidence for ischemia.   Viktor Philipp [employer]

## 2011-07-08 NOTE — Progress Notes (Signed)
nuc med report routed to Dr. Clifton James 07/08/11 Larry [name]

## 2011-07-09 ENCOUNTER — Telehealth: Payer: Self-pay | Admitting: Cardiovascular Disease

## 2011-07-09 NOTE — Progress Notes (Signed)
lmtcb

## 2011-07-09 NOTE — Telephone Encounter (Signed)
Patient aware of stress test results.  

## 2011-07-09 NOTE — Progress Notes (Signed)
NO ischemia. Can we let the pt. Know? Thanks, chris

## 2011-07-09 NOTE — Progress Notes (Signed)
Patient is aware of test/lab results.  

## 2011-07-09 NOTE — Telephone Encounter (Signed)
Pt rtn [facility] call °

## 2011-10-01 ENCOUNTER — Ambulatory Visit (INDEPENDENT_AMBULATORY_CARE_PROVIDER_SITE_OTHER): Payer: Medicare Other

## 2011-10-01 DIAGNOSIS — Z23 Encounter for immunization: Secondary | ICD-10-CM

## 2011-10-08 ENCOUNTER — Encounter: Payer: Self-pay | Admitting: Internal Medicine

## 2011-10-28 ENCOUNTER — Ambulatory Visit: Payer: No Typology Code available for payment source

## 2011-11-04 ENCOUNTER — Telehealth: Payer: Self-pay | Admitting: Cardiovascular Disease

## 2011-11-04 ENCOUNTER — Telehealth: Payer: Self-pay

## 2011-11-04 DIAGNOSIS — I2510 Atherosclerotic heart disease of native coronary artery without angina pectoris: Secondary | ICD-10-CM

## 2011-11-04 DIAGNOSIS — E785 Hyperlipidemia, unspecified: Secondary | ICD-10-CM

## 2011-11-04 NOTE — Telephone Encounter (Signed)
Pt needs refill for crestor 10 mg  , niaspan 1000 mg, plavix 75mg ,. viagra  100 mg as needed,  uses mail order needs written , will pick up, would like to pick up tomorrow has lab work   [phone], requesting samples of the niaspan and crestor

## 2011-11-04 NOTE — Telephone Encounter (Signed)
Done hardcopy to robin  

## 2011-11-04 NOTE — Telephone Encounter (Signed)
Patient would like a prescription for a shingles shot and pickup tomorrow 11/05/2011 take to a local pharmacy and have administered. Call back number is when ready [phone]

## 2011-11-04 NOTE — Telephone Encounter (Signed)
Called the patient left message prescription is ready for pickup at front desk

## 2011-11-05 ENCOUNTER — Other Ambulatory Visit: Payer: Self-pay | Admitting: Internal Medicine

## 2011-11-05 ENCOUNTER — Other Ambulatory Visit (INDEPENDENT_AMBULATORY_CARE_PROVIDER_SITE_OTHER): Payer: Medicare Other | Admitting: *Deleted

## 2011-11-05 DIAGNOSIS — E785 Hyperlipidemia, unspecified: Secondary | ICD-10-CM

## 2011-11-05 LAB — HEPATIC FUNCTION PANEL
AST: 26 U/L (ref 0–37)
Albumin: 4.2 g/dL (ref 3.5–5.2)
Alkaline Phosphatase: 38 U/L — ABNORMAL LOW (ref 39–117)
Total Protein: 7.4 g/dL (ref 6.0–8.3)

## 2011-11-05 MED ORDER — ROSUVASTATIN CALCIUM 10 MG PO TABS: ORAL_TABLET | ORAL | Status: DC

## 2011-11-05 MED ORDER — SILDENAFIL CITRATE 100 MG PO TABS: 100.0000 mg | ORAL_TABLET | Freq: Every day | ORAL | Status: DC | PRN

## 2011-11-05 MED ORDER — CLOPIDOGREL BISULFATE 75 MG PO TABS: 75.0000 mg | ORAL_TABLET | Freq: Every day | ORAL | Status: DC

## 2011-11-05 MED ORDER — NIACIN ER (ANTIHYPERLIPIDEMIC) 1000 MG PO TBCR: 1000.0000 mg | EXTENDED_RELEASE_TABLET | Freq: Every day | ORAL | Status: DC

## 2011-11-05 NOTE — Telephone Encounter (Signed)
Called pt for clarification of Viagra. Left message to call back.

## 2011-11-05 NOTE — Telephone Encounter (Signed)
Pt rtn call [phone]

## 2011-11-05 NOTE — Telephone Encounter (Signed)
[facility] This is not Dr. Vern Claude pt. It looks like Dr. Jonny Ruiz fills these for pt. I checked this in centricity Mylo Red RN

## 2011-11-05 NOTE — Telephone Encounter (Signed)
Spoke with pt and he reports he usually gets 30 tablets of Viagra. Will have Dr. Clifton James sign prescription when back in office. Pt made aware all other prescriptions have been completed. Pt notified Crestor samples are at front desk and we have no 1000 mg Niaspan samples. He will come to office on Monday to pick up.

## 2011-11-05 NOTE — Telephone Encounter (Signed)
Samples of Crestor--10 mg (21 tablets) left at front desk. No Niaspan 1000 mg samples in office.

## 2011-11-11 LAB — NMR LIPOPROFILE WITH LIPIDS
HDL-C: 41 mg/dL (ref >=40)
LDL Particle Number: 1044 nmol/L — ABNORMAL HIGH (ref <1000)
LDL Size: 20.5 nm — ABNORMAL LOW (ref >20.5)
Small LDL Particle Number: 522 nmol/L (ref <=527)
Triglycerides: 95 mg/dL (ref <150)

## 2011-11-15 ENCOUNTER — Ambulatory Visit: Payer: No Typology Code available for payment source

## 2011-11-18 ENCOUNTER — Ambulatory Visit (INDEPENDENT_AMBULATORY_CARE_PROVIDER_SITE_OTHER): Payer: Medicare Other | Admitting: Pharmacist

## 2011-11-18 DIAGNOSIS — E785 Hyperlipidemia, unspecified: Secondary | ICD-10-CM

## 2011-11-18 NOTE — Assessment & Plan Note (Addendum)
Pt's lipid profile has significantly improved since his last visit, and he has lost 10 lbs as a result of significant dietary and exercise changes with his wife. LFTs WNL.  1. Continue Crestor 10mg , alternating between 1 tablet and a 1/2 tablet daily. Leg pain not attributable to statin use, as it is relieved with movement and has a different quality than what patient felt with Simvastatin. 2. Continue Niaspan 1000mg  every night at bedtime. 3. OK to increase OTC Fish Oil to 2 capsules twice daily = 1,200mg  EPA/DHA daily. 4. Continue cardiovascular exercise, walking 45 minutes all days of the week with 1.5 lb weights on each hand. 5. Continue eating healthy, limiting high-fat foods and sweets, choosing high-fiber foods, and aiming for at least 3-5 servings of fruits and veggies daily. Suggested yogurt as another sweet treat option - pt likes fruit-flavored yogurts. 6. Follow up with [facility] labs and clinic appt in 4 months (late March or early April), per pt request if insurance allows. Otherwise f/u lab workup and clinic appt in 6 months.

## 2011-11-18 NOTE — Patient Instructions (Addendum)
1. Continue Crestor 10mg , alternating between 1 tablet and a 1/2 tablet daily. 2. Continue Niaspan 1000mg  every night at bedtime. 3. Increase OTC Fish Oil to 2 capsules twice daily. 4. Continue cardiovascular exercise, walking 45 minutes all days of the week with 1.5 lb weights on each hand. 5. Continue eating healthy, limiting sweets and aiming for at least 3-5 servings of fruits and veggies daily. Consider snacking on yogurt to treat yourself versus just cream-filled cookies (can alternate). 6. Follow up in 4 months (late March or early April), per your request if insurance allows. Otherwise f/u lab workup and clinic appt in 6 months.

## 2011-11-18 NOTE — Progress Notes (Signed)
77yo WM with CAD (CABG 1999, cath 2008) and hyperlipidemia returns with spouse for dyslipidemia 36-month follow up. He mentions that his daughter gave impetus to alter his lifestyle habits after she took a look at his lab results from May 2012.  Labs/Goals TC-121 (121 in May 2012, goal<200),  TG 95 (195 in May 2012, goal<150),  HDL-C- 41 (35 in May 2012, goal>40),  LDL-C- 61 (47 in May, goal<70),  LDL-P- 1044 (1064 in May 2012, goal<[phone])  Current lipid regimen:  1. Crestor 10 mg, alternating between 1 tablet and 1/2 tablet daily 2. Niaspan 1000 mg QHS 3. OTC Fish oil 1 capsules BID --He has no medication complaints at this time. Has leg pain that is relieved after 20 minutes of movement. --Pt asks if he can go up his dose of OTC fish oil after seeing a program on TV that suggested at least 1 gram of DHA/EPA for cardiovascular benefit. Each capsule at home contains 300mg  DHA/EPA.  weight: 172 lbs today (182 lbs in May 2012) BP:       120/84 today  Exercise: Increased exercise since his last visit.  --Walks briskly 7 days a week in the mornings with his wife x 45 mins, 1.5 lb weights in each hand. --They do not feel that the colder whether will deter their morning workouts, but has a cardio machine at home in case the winter keeps them indoors.    Diet: Taking in much less sweets and no longer eats potato chips. --Breakfast: cereal or oatmeal or fruit with whole grain toast/peanut butter/honey --Lunch: 1/2 sandwich with whole grain toast --Dinner: fish or grilled chicken with steamed vegetables --Sweets: Cut out ice-cream and chips all-together. Did not eat cookies for 3 months, now eats 3-4 cream-filled cookies weekly to treat himself.

## 2011-11-22 ENCOUNTER — Ambulatory Visit: Payer: No Typology Code available for payment source

## 2011-12-04 ENCOUNTER — Other Ambulatory Visit: Payer: Self-pay | Admitting: Internal Medicine

## 2011-12-25 ENCOUNTER — Encounter: Payer: Self-pay | Admitting: Internal Medicine

## 2011-12-25 DIAGNOSIS — I252 Old myocardial infarction: Secondary | ICD-10-CM | POA: Insufficient documentation

## 2011-12-25 DIAGNOSIS — M519 Unspecified thoracic, thoracolumbar and lumbosacral intervertebral disc disorder: Secondary | ICD-10-CM | POA: Insufficient documentation

## 2011-12-25 DIAGNOSIS — Z0000 Encounter for general adult medical examination without abnormal findings: Secondary | ICD-10-CM | POA: Insufficient documentation

## 2011-12-25 DIAGNOSIS — S83206A Unspecified tear of unspecified meniscus, current injury, right knee, initial encounter: Secondary | ICD-10-CM | POA: Insufficient documentation

## 2011-12-25 DIAGNOSIS — Z9889 Other specified postprocedural states: Secondary | ICD-10-CM | POA: Insufficient documentation

## 2011-12-25 DIAGNOSIS — M169 Osteoarthritis of hip, unspecified: Secondary | ICD-10-CM | POA: Insufficient documentation

## 2011-12-30 ENCOUNTER — Ambulatory Visit (INDEPENDENT_AMBULATORY_CARE_PROVIDER_SITE_OTHER): Payer: Medicare Other | Admitting: Internal Medicine

## 2011-12-30 ENCOUNTER — Other Ambulatory Visit (INDEPENDENT_AMBULATORY_CARE_PROVIDER_SITE_OTHER): Payer: Medicare Other

## 2011-12-30 ENCOUNTER — Encounter: Payer: Self-pay | Admitting: Internal Medicine

## 2011-12-30 VITALS — BP 110/80 | HR 70 | Temp 97.6°F | Ht 67.0 in | Wt 172.4 lb

## 2011-12-30 DIAGNOSIS — N320 Bladder-neck obstruction: Secondary | ICD-10-CM | POA: Insufficient documentation

## 2011-12-30 DIAGNOSIS — I10 Essential (primary) hypertension: Secondary | ICD-10-CM | POA: Diagnosis not present

## 2011-12-30 DIAGNOSIS — E785 Hyperlipidemia, unspecified: Secondary | ICD-10-CM

## 2011-12-30 DIAGNOSIS — M549 Dorsalgia, unspecified: Secondary | ICD-10-CM

## 2011-12-30 DIAGNOSIS — I2510 Atherosclerotic heart disease of native coronary artery without angina pectoris: Secondary | ICD-10-CM

## 2011-12-30 DIAGNOSIS — M79604 Pain in right leg: Secondary | ICD-10-CM | POA: Insufficient documentation

## 2011-12-30 LAB — URINALYSIS, ROUTINE W REFLEX MICROSCOPIC
Bilirubin Urine: NEGATIVE
Hgb urine dipstick: NEGATIVE
Specific Gravity, Urine: >=1.03 (ref 1.000–1.030)
Total Protein, Urine: NEGATIVE
Urobilinogen, UA: 0.2 (ref 0.0–1.0)
pH: 6 (ref 5.0–8.0)

## 2011-12-30 MED ORDER — NIACIN ER (ANTIHYPERLIPIDEMIC) 1000 MG PO TBCR: 1000.0000 mg | EXTENDED_RELEASE_TABLET | Freq: Every day | ORAL | Status: DC

## 2011-12-30 MED ORDER — ROSUVASTATIN CALCIUM 10 MG PO TABS: ORAL_TABLET | ORAL | Status: DC

## 2011-12-30 MED ORDER — SILDENAFIL CITRATE 100 MG PO TABS: 100.0000 mg | ORAL_TABLET | Freq: Every day | ORAL | Status: DC | PRN

## 2011-12-30 MED ORDER — CLOPIDOGREL BISULFATE 75 MG PO TABS: 75.0000 mg | ORAL_TABLET | Freq: Every day | ORAL | Status: DC

## 2011-12-30 NOTE — Patient Instructions (Signed)
Continue all other medications as before You are otherwise up to date with prevention today Please go to LAB in the Basement for the blood and/or urine tests to be done today Please call the phone number [phone] (the PhoneTree System) for results of testing in 2-3 days;  When calling, simply dial the number, and when prompted enter the MRN number above (the Medical Record Number) and the # key, then the message should start. Please return in 1 year for your yearly visit, or sooner if needed

## 2012-01-02 ENCOUNTER — Encounter: Payer: Self-pay | Admitting: Internal Medicine

## 2012-01-02 NOTE — Assessment & Plan Note (Signed)
stable overall by hx and exam, most recent data reviewed with pt, and pt to continue medical treatment as before  Lab Results  Component Value Date   LDLCALC 61 11/05/2011

## 2012-01-02 NOTE — Assessment & Plan Note (Signed)
stable overall by hx and exam, most recent data reviewed with pt, and pt to continue medical treatment as before  BP Readings from Last 3 Encounters:  12/30/11 110/80  07/01/11 132/80  05/06/11 122/85

## 2012-01-02 NOTE — Assessment & Plan Note (Signed)
C/w msk strain vs ongoing lumbar spine related, mild, neuro exam bening,  to f/u any worsening symptoms or concerns

## 2012-01-02 NOTE — Progress Notes (Signed)
Subjective:    Patient ID: Larry [name], male    DOB: June 21, 1932, 78 y.o.   MRN: [id number]  HPI Pt continues to have recurring LBP without change in severity, bowel or bladder change, fever, wt loss,  worsening LE pain/numbness/weakness, gait change or falls.  Does have post left leg pain below the buttock, only worse to stand or walk.   Denies urinary symptoms such as dysuria, frequency, urgency,or hematuria.  Pt denies chest pain, increased sob or doe, wheezing, orthopnea, PND, increased LE swelling, palpitations, dizziness or syncope.  Pt denies new neurological symptoms such as new headache, or facial or extremity weakness or numbness   Pt denies polydipsia, polyuria, Past Medical History  Diagnosis Date  . History of colonic polyps   . ED (erectile dysfunction)   . Diverticulosis of colon   . CAD (coronary artery disease)     S/P CABG in 1999   . Hyperlipidemia     Low HDL  . Decreased exercise tolerance     Exertional fatigue  . Lumbar disc disease     Hx of with recent back pain and buttock pain  . S/P removal of thyroid nodule 12/25/2011  . History of MI (myocardial infarction) 12/25/2011  . Right knee meniscal tear 12/25/2011  . Degenerative arthritis of hip 12/25/2011   Past Surgical History  Procedure Date  . Coronary artery bypass graft 1999  . Cataract extraction   . Ptca   . Tonsillectomy   . Vasectomy   . Nodules on thyroid   . Lumbar disc surgery     reports that he has never smoked. He does not have any smokeless tobacco history on file. He reports that he does not drink alcohol or use illicit drugs. family history includes Prostate cancer in his others. Allergies  Allergen Reactions  . Beta Adrenergic Blockers     REACTION: low blood pressures  . Simvastatin     REACTION: myalgias   Current Outpatient Prescriptions on File Prior to Visit  Medication Sig Dispense Refill  . acetaminophen (TYLENOL) 650 MG CR tablet Take 650 mg by mouth every 8 (eight) hours as  needed.        Marland Kitchen aspirin 81 MG EC tablet Take 81 mg by mouth daily.        . calcium-vitamin D (OSCAL WITH D) 500-200 MG-UNIT per tablet Take 1 tablet by mouth daily.        . fish oil-omega-3 fatty acids 1000 MG capsule Take 2 g by mouth daily.        . folic acid (FOLVITE) 400 MCG tablet Take 400 mcg by mouth daily.        . Multiple Vitamins-Minerals (CENTRUM SILVER ULTRA MENS PO) Take 1 tablet by mouth daily.          Review of Systems Review of Systems  Constitutional: Negative for diaphoresis and unexpected weight change.  HENT: Negative for drooling and tinnitus.   Eyes: Negative for photophobia and visual disturbance.  Respiratory: Negative for choking and stridor.   Gastrointestinal: Negative for vomiting and blood in stool.  Genitourinary: Negative for hematuria and decreased urine volume.  Musculoskeletal: Negative for gait problem.  Skin: Negative for color change and wound.  Neurological: Negative for tremors and numbness.  Psychiatric/Behavioral: Negative for decreased concentration. The patient is not hyperactive.       Objective:   Physical Exam BP 110/80  Pulse 70  Temp(Src) 97.6 F (36.4 C) (Oral)  Ht 5\' 7"  (1.702 m)  Wt 172 lb 6 oz (78.189 kg)  BMI 27.00 kg/m2  SpO2 96% Physical Exam  VS noted Constitutional: Pt appears well-developed and well-nourished.  HENT: Head: Normocephalic.  Right Ear: External ear normal.  Left Ear: External ear normal.  Eyes: Conjunctivae and EOM are normal. Pupils are equal, round, and reactive to light.  Neck: Normal range of motion. Neck supple.  Cardiovascular: Normal rate and regular rhythm.   Pulmonary/Chest: Effort normal and breath sounds normal.  Abd:  Soft, NT, non-distended, + BS Neurological: Pt is alert. No cranial nerve deficit. motor/sens/dtr/gait intact to LE Skin: Skin is warm. No erythema.  Psychiatric: Pt behavior is normal. Thought content normal.  Spine/post left leg nontender,    Assessment & Plan:

## 2012-01-02 NOTE — Assessment & Plan Note (Signed)
Also due for f/u ua/psa,  to f/u any worsening symptoms or concerns

## 2012-04-21 ENCOUNTER — Other Ambulatory Visit: Payer: Self-pay | Admitting: Pharmacist

## 2012-04-21 DIAGNOSIS — E785 Hyperlipidemia, unspecified: Secondary | ICD-10-CM

## 2012-04-25 ENCOUNTER — Other Ambulatory Visit: Payer: Medicare Other

## 2012-04-25 ENCOUNTER — Other Ambulatory Visit (INDEPENDENT_AMBULATORY_CARE_PROVIDER_SITE_OTHER): Payer: Medicare Other

## 2012-04-25 DIAGNOSIS — E785 Hyperlipidemia, unspecified: Secondary | ICD-10-CM | POA: Diagnosis not present

## 2012-04-25 LAB — HEPATIC FUNCTION PANEL
AST: 22 U/L (ref 0–37)
Albumin: 4 g/dL (ref 3.5–5.2)
Alkaline Phosphatase: 39 U/L (ref 39–117)
Bilirubin, Direct: 0.2 mg/dL (ref 0.0–0.3)

## 2012-04-27 LAB — NMR LIPOPROFILE WITH LIPIDS
Cholesterol, Total: 122 mg/dL (ref <200)
LDL (calc): 52 mg/dL (ref <100)
LDL Particle Number: 1105 nmol/L — ABNORMAL HIGH (ref <1000)
LP-IR Score: 63 — ABNORMAL HIGH (ref <=45)
Triglycerides: 141 mg/dL (ref <150)

## 2012-05-02 ENCOUNTER — Ambulatory Visit (INDEPENDENT_AMBULATORY_CARE_PROVIDER_SITE_OTHER): Payer: Medicare Other | Admitting: Pharmacist

## 2012-05-02 VITALS — Wt 172.0 lb

## 2012-05-02 DIAGNOSIS — E785 Hyperlipidemia, unspecified: Secondary | ICD-10-CM

## 2012-05-02 NOTE — Progress Notes (Signed)
HPI  Patient presented to clinic today with spouse for dyslipidemia follow-up.  Current lipid regimen includes Crestor 10mg  tablets alternating 1 tablet and 1/2 tablet daily, Niaspan 1000mg  QHS and fish oil 1g BID.  Patient has no complains of muscle aches or pains.  Reports flushing about 3 times per month.    Diet Patient notes that he has not been as strict with reduction in sweets and carbs, that can be attributed to the slight increase in TGs.  Snacks usually include a cup of coffee and a cookie (Oreos) Breakfast: 4 servings of fruits, toast with peanut butter and honey and coffee  Lunch: 1/2 [place] sandwich with lettuce, carrots, pretzels or sometimes cheese cubes with crackers, 1/2 apple and humus.   Dinner: Chicken, talapia or salmon for dinner.  Generally eats all different vegetables.  Sometimes might have a [employer].    Exercise Reports walking about every day. He walks with 1 1/2lb weights in his hands.  Reports cutting his own grass.    Current Outpatient Prescriptions  Medication Sig Dispense Refill  . acetaminophen (TYLENOL) 650 MG CR tablet Take 650 mg by mouth every 8 (eight) hours as needed.        Marland Kitchen aspirin 81 MG EC tablet Take 81 mg by mouth daily.        . calcium-vitamin D (OSCAL WITH D) 500-200 MG-UNIT per tablet Take 1 tablet by mouth daily.        . clopidogrel (PLAVIX) 75 MG tablet Take 1 tablet (75 mg total) by mouth daily.  90 tablet  3  . fish oil-omega-3 fatty acids 1000 MG capsule Take 2 g by mouth daily.        . folic acid (FOLVITE) 400 MCG tablet Take 400 mcg by mouth daily.        . Multiple Vitamins-Minerals (CENTRUM SILVER ULTRA MENS PO) Take 1 tablet by mouth daily.        . niacin (NIASPAN) 1000 MG CR tablet Take 1 tablet (1,000 mg total) by mouth at bedtime.  90 tablet  3  . rosuvastatin (CRESTOR) 10 MG tablet Alternate one tablet with a half tablet daily  90 tablet  3  . sildenafil (VIAGRA) 100 MG tablet Take 1 tablet (100 mg  total) by mouth daily as needed.  30 tablet  3   Allergies  Allergen Reactions  . Beta Adrenergic Blockers     REACTION: low blood pressures  . Simvastatin     REACTION: myalgias

## 2012-05-02 NOTE — Patient Instructions (Addendum)
Continue your current medications.  Try to cut back on the sweets and carbohydrates just a little.  It is okay to have all of those things in moderation.    Recheck labs in November.

## 2012-05-02 NOTE — Assessment & Plan Note (Signed)
Patient's lipid levels have remained relatively stable at: TC 122 (goal<200), TG 141 (goal<150), HDL 42 (goal>40), LDL 52 (ZOXW<96) and LDL-P 1105 (goal<1000).  LFTs are WNL.  Maintained same regimen of Crestor 10mg  alternating 1 tablet and 1/2 tablet daily, Niaspan 1000mg  QHS and fish oil 1g BID.  Encouraged patients to try to reduce the amount of sweets and carbs in diet due to increase in TGs.  Discussed being discharged to PCP after follow-up visit in 6 months if everything continues to remain stable.

## 2012-07-04 ENCOUNTER — Encounter: Payer: Self-pay | Admitting: Internal Medicine

## 2012-08-17 DIAGNOSIS — Z961 Presence of intraocular lens: Secondary | ICD-10-CM | POA: Diagnosis not present

## 2012-08-17 DIAGNOSIS — H0230 Blepharochalasis unspecified eye, unspecified eyelid: Secondary | ICD-10-CM | POA: Diagnosis not present

## 2012-08-17 DIAGNOSIS — H26499 Other secondary cataract, unspecified eye: Secondary | ICD-10-CM | POA: Diagnosis not present

## 2012-08-18 ENCOUNTER — Encounter: Payer: Self-pay | Admitting: *Deleted

## 2012-08-19 DIAGNOSIS — H26499 Other secondary cataract, unspecified eye: Secondary | ICD-10-CM | POA: Diagnosis not present

## 2012-08-19 DIAGNOSIS — Z961 Presence of intraocular lens: Secondary | ICD-10-CM | POA: Diagnosis not present

## 2012-08-28 ENCOUNTER — Encounter: Payer: Self-pay | Admitting: Internal Medicine

## 2012-09-06 ENCOUNTER — Ambulatory Visit (INDEPENDENT_AMBULATORY_CARE_PROVIDER_SITE_OTHER): Payer: Medicare Other

## 2012-09-06 DIAGNOSIS — Z23 Encounter for immunization: Secondary | ICD-10-CM | POA: Diagnosis not present

## 2012-09-08 ENCOUNTER — Ambulatory Visit (INDEPENDENT_AMBULATORY_CARE_PROVIDER_SITE_OTHER): Payer: Medicare Other | Admitting: Internal Medicine

## 2012-09-08 ENCOUNTER — Encounter: Payer: Self-pay | Admitting: Internal Medicine

## 2012-09-08 ENCOUNTER — Other Ambulatory Visit (INDEPENDENT_AMBULATORY_CARE_PROVIDER_SITE_OTHER): Payer: Medicare Other

## 2012-09-08 VITALS — BP 110/70 | HR 48 | Ht 67.0 in | Wt 173.6 lb

## 2012-09-08 DIAGNOSIS — Z86010 Personal history of colonic polyps: Secondary | ICD-10-CM

## 2012-09-08 DIAGNOSIS — D689 Coagulation defect, unspecified: Secondary | ICD-10-CM

## 2012-09-08 LAB — CBC WITH DIFFERENTIAL/PLATELET
Basophils Absolute: 0 10*3/uL (ref 0.0–0.1)
HCT: 45.7 % (ref 39.0–52.0)
Hemoglobin: 15.5 g/dL (ref 13.0–17.0)
Lymphs Abs: 2.1 10*3/uL (ref 0.7–4.0)
Monocytes Relative: 12.1 % — ABNORMAL HIGH (ref 3.0–12.0)
Neutro Abs: 3.6 10*3/uL (ref 1.4–7.7)
RDW: 13.2 % (ref 11.5–14.6)

## 2012-09-08 NOTE — Progress Notes (Signed)
Larry [name] 17-Jan-1932 MRN [id number]        History of Present Illness:  This is a 78 year old white male here to discuss recall colonoscopy. He has a history of coronary artery disease. 3V coronary artery bypass graft in 1999 in [place] and  coronary angiogram by Dr Julious Payer. In 2008 for which she has been on Plavix. He had a screening colonoscopy in 2000 with findings of a polyp and  recall colonoscopy in July 2005 which showed mild diverticulosis but no polyps. He has regular bowel habits, no rectal bleeding or abdominal pain.   Past Medical History  Diagnosis Date  . History of colonic polyps   . ED (erectile dysfunction)   . Diverticulosis of colon   . CAD (coronary artery disease)     S/P CABG in 1999   . Hyperlipidemia     Low HDL  . Decreased exercise tolerance     Exertional fatigue  . Lumbar disc disease     Hx of with recent back pain and buttock pain  . S/P removal of thyroid nodule 12/25/2011  . History of MI (myocardial infarction) 12/25/2011  . Right knee meniscal tear 12/25/2011  . Degenerative arthritis of hip 12/25/2011  . Hx of adenomatous colonic polyps 2000   Past Surgical History  Procedure Date  . Coronary artery bypass graft 1999  . Cataract extraction   . Ptca   . Tonsillectomy   . Vasectomy   . Nodules on thyroid   . Lumbar disc surgery     reports that he has never smoked. He has never used smokeless tobacco. He reports that he does not drink alcohol or use illicit drugs. family history includes Heart disease in his brother and mother and Prostate cancer in his others. Allergies  Allergen Reactions  . Beta Adrenergic Blockers     REACTION: low blood pressures  . Simvastatin     REACTION: myalgias        Review of Systems: Denies dysphagia odynophagia palpitations or chest pain  The remainder of the 10 point ROS is negative except as outlined in H&P   Physical Exam: General appearance  Well developed, in no distress. Eyes- non  icteric. HEENT nontraumatic, normocephalic. Mouth no lesions, tongue papillated, no cheilosis. Neck supple without adenopathy, thyroid not enlarged, no carotid bruits, no JVD. Lungs Clear to auscultation bilaterally. Cor irregular heartbeat in a bigeminal rhythm. No murmur. Post thoracotomy scar Abdomen: Soft nontender abdomen with the normoactive bowel sounds Rectal: Soft Hemoccult negative stool Extremities no pedal edema. Skin no lesions. Neurological alert and oriented x 3. Psychological normal mood and affect.  Assessment and Plan:  78 year old white male with history of colon polyp 13 years ago but no polyps on last colonoscopy 8 years ago. He is on Plavix. He is asymptomatic GI wise and there is no family history of colon cancer. According to the current guidelines he would be due for colonoscopy in July 2015. We will send him a recall letter in June 2015. To schedule colonoscopy providing stable medical condition. We will  then decide if he needs to stay off Plavix or on Plavix  Coronary artery disease. Status post a coronary artery bypass graft 1999. Today his rhythm is irregular in a bigeminal rhythm. I will ask him to see his cardiologist for followup. He says he has been up-to-date on his cardiac scans    09/08/2012 Lina Sar

## 2012-09-08 NOTE — Patient Instructions (Addendum)
Your physician has requested that you go to the basement for the following lab work before leaving today: CBC. You will be due for a recall colonoscopy in 06/2014. We will send you a reminder in the mail when it gets closer to that time.  cc: Oliver Barre, MD to see Dr Julious Payer for irregular heart rhythm

## 2012-09-12 ENCOUNTER — Ambulatory Visit: Payer: Medicare Other | Admitting: Internal Medicine

## 2012-09-13 ENCOUNTER — Encounter: Payer: Self-pay | Admitting: Cardiovascular Disease

## 2012-09-13 ENCOUNTER — Ambulatory Visit (INDEPENDENT_AMBULATORY_CARE_PROVIDER_SITE_OTHER): Payer: Medicare Other | Admitting: Cardiovascular Disease

## 2012-09-13 VITALS — BP 115/76 | HR 69 | Ht 67.0 in | Wt 174.0 lb

## 2012-09-13 DIAGNOSIS — I2510 Atherosclerotic heart disease of native coronary artery without angina pectoris: Secondary | ICD-10-CM | POA: Diagnosis not present

## 2012-09-13 DIAGNOSIS — I4949 Other premature depolarization: Secondary | ICD-10-CM | POA: Diagnosis not present

## 2012-09-13 DIAGNOSIS — I493 Ventricular premature depolarization: Secondary | ICD-10-CM

## 2012-09-13 NOTE — Patient Instructions (Addendum)
Your physician wants you to follow-up in: 6 months.  You will receive a reminder letter in the mail two months in advance. If you don't receive a letter, please call our office to schedule the follow-up appointment.  Your physician has recommended that you wear a holter monitor. Holter monitors are medical devices that record the heart's electrical activity. Doctors most often use these monitors to diagnose arrhythmias. Arrhythmias are problems with the speed or rhythm of the heartbeat. The monitor is a small, portable device. You can wear one while you do your normal daily activities. This is usually used to diagnose what is causing palpitations/syncope (passing out).   

## 2012-09-13 NOTE — Progress Notes (Signed)
History of Present Illness: 78 yo WM with history of CAD, HLD here for cardiac follow up. He has been followed in the past by Dr. Juanda Chance. He had 3V CABG in 1999 at [facility] in [place]. In 2008 he had a catheterization and was found to have a patent LIMA to LAD and a patent vein graft to the right coronary and no significant obstruction in the circumflex artery. He has been doing well since that time. Exercise stress test July 2012 with no ischemia. LVEF 50%.   He is here today for follow up. He has had no chest pain, SOB or palpitations. He feels well. He is active in his yard. Bigeminy during GI visit last week.   Primary Care Physician: Oliver Barre  Last Lipid Profile:  Total chol: 122     LdL: 52   HDL:  42   Past Medical History  Diagnosis Date  . History of colonic polyps   . ED (erectile dysfunction)   . Diverticulosis of colon   . CAD (coronary artery disease)     S/P CABG in 1999   . Hyperlipidemia     Low HDL  . Decreased exercise tolerance     Exertional fatigue  . Lumbar disc disease     Hx of with recent back pain and buttock pain  . S/P removal of thyroid nodule 12/25/2011  . History of MI (myocardial infarction) 12/25/2011  . Right knee meniscal tear 12/25/2011  . Degenerative arthritis of hip 12/25/2011  . Hx of adenomatous colonic polyps 2000    Past Surgical History  Procedure Date  . Coronary artery bypass graft 1999  . Cataract extraction   . Ptca   . Tonsillectomy   . Vasectomy   . Nodules on thyroid   . Lumbar disc surgery     Current Outpatient Prescriptions  Medication Sig Dispense Refill  . acetaminophen (TYLENOL) 650 MG CR tablet Take 650 mg by mouth every 8 (eight) hours as needed.        Marland Kitchen aspirin 81 MG EC tablet Take 81 mg by mouth daily.        . calcium-vitamin D (OSCAL WITH D) 500-200 MG-UNIT per tablet Take 1 tablet by mouth daily.        . clopidogrel (PLAVIX) 75 MG tablet Take 1 tablet (75 mg total) by mouth daily.  90 tablet  3  .  fish oil-omega-3 fatty acids 1000 MG capsule Take 2 g by mouth daily.        . folic acid (FOLVITE) 400 MCG tablet Take 400 mcg by mouth daily.        . Multiple Vitamins-Minerals (CENTRUM SILVER ULTRA MENS PO) Take 1 tablet by mouth daily.        . niacin (NIASPAN) 1000 MG CR tablet Take 1 tablet (1,000 mg total) by mouth at bedtime.  90 tablet  3  . rosuvastatin (CRESTOR) 10 MG tablet Alternate one tablet with a half tablet daily  90 tablet  3  . sildenafil (VIAGRA) 100 MG tablet Take 1 tablet (100 mg total) by mouth daily as needed.  30 tablet  3    Allergies  Allergen Reactions  . Beta Adrenergic Blockers     REACTION: low blood pressures  . Simvastatin     REACTION: myalgias    History   Social History  . Marital Status: Married    Spouse Name: N/A    Number of Children: 2  . Years of  Education: N/A   Occupational History  . 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 Smoking status: Never Smoker   . Smokeless tobacco: Never Used  . Alcohol Use: No  . Drug Use: No  . Sexually Active: Not on file   Other Topics Concern  . Not on file   Social History Narrative   [place]    Family History  Problem Relation Age of Onset  . Prostate cancer Other   . Prostate cancer Other   . Heart disease Mother   . Heart disease Brother     Review of Systems:  As stated in the HPI and otherwise negative.   BP 115/76  Pulse 69  Ht 5\' 7"  (1.702 m)  Wt 174 lb (78.926 kg)  BMI 27.25 kg/m2  Physical Examination: General: Well developed, well nourished, NAD HEENT: OP clear, mucus membranes moist SKIN: warm, dry. No rashes. Neuro: No focal deficits Musculoskeletal: Muscle strength 5/5 all ext Psychiatric: Mood and affect normal Neck: No JVD, no carotid bruits, no thyromegaly, no lymphadenopathy. Lungs:Clear bilaterally, no wheezes, rhonci, crackles Cardiovascular: Regular rate and rhythm. No  murmurs, gallops or rubs. Abdomen:Soft. Bowel sounds present. Non-tender.  Extremities: No lower extremity edema. Pulses are 2 + in the bilateral DP/PT.  EKG: Sinus with PVCs in pattern of bigeminy.   Exercise myoview 07/07/11: Stress Procedure: The patient exercised for 6:00. The patient stopped due to fatigue, sob, and denied any chest pain. There were no significant ST-T wave changes and occ pvcs/pacs. Technetium 75m Tetrofosmin was injected at peak exercise and myocardial perfusion imaging was performed after a brief delay.  Stress ECG: No significant change from baseline ECG  QPS  Raw Data Images: Normal; no motion artifact; normal heart/lung ratio.  Stress Images: Moderate basal to mid inferior perfusion defect.  Rest Images: Moderate basal to mid inferior perfusion defect.  Subtraction (SDS): Fixed basal to mid inferior perfusion defect.  Transient Ischemic Dilatation (Normal <1.22): 0.96  Lung/Heart Ratio (Normal <0.45): 0.36  Quantitative Gated Spect Images  QGS EDV: 102 ml  QGS ESV: 52 ml  QGS cine images: Mild global hypokinesis.  QGS EF: 50%  Impression  Exercise Capacity: Fair exercise capacity.  BP Response: Normal blood pressure response.  Clinical Symptoms: Shortness of breath, no chest pain.  ECG Impression: Baseline slight ST depression in anterolateral leads, no significant change with exertion.  Comparison with Prior Nuclear Study: No significant change from previous study  Overall Impression: Low risk stress nuclear study. Moderate fixed basal to mid inferior perfusion defect suggestive of prior MI. No evidence for ischemia.    Assessment and Plan:   1. CORONARY ARTERY DISEASE: He is on good medications. No chest pain or SOB. Stress test normal July 2012. Continue current meds. BP and lipids controlled.   2. PVCs: Will arrange 48 hour monitor to assess number of PVCs

## 2012-09-20 ENCOUNTER — Encounter (INDEPENDENT_AMBULATORY_CARE_PROVIDER_SITE_OTHER): Payer: Medicare Other

## 2012-09-20 DIAGNOSIS — R002 Palpitations: Secondary | ICD-10-CM | POA: Diagnosis not present

## 2012-09-20 DIAGNOSIS — I493 Ventricular premature depolarization: Secondary | ICD-10-CM

## 2012-09-29 ENCOUNTER — Telehealth: Payer: Self-pay | Admitting: Cardiovascular Disease

## 2012-09-29 DIAGNOSIS — I4949 Other premature depolarization: Secondary | ICD-10-CM

## 2012-09-29 MED ORDER — DILTIAZEM HCL ER COATED BEADS 120 MG PO CP24: 120.0000 mg | ORAL_CAPSULE | Freq: Every day | ORAL | Status: DC

## 2012-09-29 NOTE — Telephone Encounter (Signed)
plz return call to [phone] regarding test results

## 2012-09-29 NOTE — Telephone Encounter (Signed)
Dr. Clifton James reviewed monitor results with pt and gave him instructions to start Cardizem CD 120 mg by mouth daily and have echo done.  Will send prescription to [facility] [address] and have schedulers contact pt on Monday to make echo appt.

## 2012-10-03 ENCOUNTER — Ambulatory Visit (HOSPITAL_COMMUNITY): Payer: Medicare Other | Attending: Cardiovascular Disease

## 2012-10-03 DIAGNOSIS — I2510 Atherosclerotic heart disease of native coronary artery without angina pectoris: Secondary | ICD-10-CM | POA: Insufficient documentation

## 2012-10-03 DIAGNOSIS — I059 Rheumatic mitral valve disease, unspecified: Secondary | ICD-10-CM | POA: Diagnosis not present

## 2012-10-03 DIAGNOSIS — I080 Rheumatic disorders of both mitral and aortic valves: Secondary | ICD-10-CM | POA: Diagnosis not present

## 2012-10-03 DIAGNOSIS — I4949 Other premature depolarization: Secondary | ICD-10-CM | POA: Diagnosis not present

## 2012-10-03 DIAGNOSIS — I369 Nonrheumatic tricuspid valve disorder, unspecified: Secondary | ICD-10-CM | POA: Insufficient documentation

## 2012-10-03 NOTE — Progress Notes (Signed)
Echocardiogram performed.  

## 2012-10-24 ENCOUNTER — Other Ambulatory Visit (INDEPENDENT_AMBULATORY_CARE_PROVIDER_SITE_OTHER): Payer: Medicare Other

## 2012-10-24 DIAGNOSIS — E785 Hyperlipidemia, unspecified: Secondary | ICD-10-CM | POA: Diagnosis not present

## 2012-10-24 LAB — HEPATIC FUNCTION PANEL
ALT: 20 U/L (ref 0–53)
Bilirubin, Direct: 0.2 mg/dL (ref 0.0–0.3)
Total Protein: 7 g/dL (ref 6.0–8.3)

## 2012-10-26 LAB — NMR LIPOPROFILE WITH LIPIDS
HDL Particle Number: 25.5 umol/L — ABNORMAL LOW (ref >=30.5)
HDL-C: 41 mg/dL (ref >=40)
LDL Size: 20.3 nm — ABNORMAL LOW (ref >20.5)
Small LDL Particle Number: 570 nmol/L — ABNORMAL HIGH (ref <=527)
Triglycerides: 170 mg/dL — ABNORMAL HIGH (ref <150)

## 2012-10-31 ENCOUNTER — Ambulatory Visit (INDEPENDENT_AMBULATORY_CARE_PROVIDER_SITE_OTHER): Payer: Medicare Other | Admitting: Pharmacist

## 2012-10-31 VITALS — Wt 175.1 lb

## 2012-10-31 DIAGNOSIS — E785 Hyperlipidemia, unspecified: Secondary | ICD-10-CM

## 2012-11-06 NOTE — Patient Instructions (Addendum)
Continue current medications  Try to cut down on the breads and sweets   Recheck labs in 6 months.

## 2012-11-06 NOTE — Progress Notes (Signed)
HPI  Patient presented to clinic today with spouse for dyslipidemia follow-up.  Current lipid regimen includes Crestor 10mg  tablets alternating 1 tablet and 1/2 tablet daily, Niaspan 1000mg  QHS and fish oil 1g BID.  Patient has no complains of muscle aches or pains.  Reports flushing about 3 times per month.  He has a few leg cramps at night but they are not consistent so he cannot relate them to anything specific.  Diet Patient notes that he has not been as strict with reduction in sweets and carbs, that can be attributed to the slight increase in TGs.  He notes a significant increase in his bread intake.    Exercise Reports walking about a few days of the week. He walks with 1 1/2lb weights in his hands.  He does like to continue walking through the winter months as well.  Reports cutting his own grass.    Current Outpatient Prescriptions  Medication Sig Dispense Refill  . acetaminophen (TYLENOL) 650 MG CR tablet Take 650 mg by mouth every 8 (eight) hours as needed.        Marland Kitchen aspirin 81 MG EC tablet Take 81 mg by mouth daily.        . calcium-vitamin D (OSCAL WITH D) 500-200 MG-UNIT per tablet Take 1 tablet by mouth daily.        . clopidogrel (PLAVIX) 75 MG tablet Take 1 tablet (75 mg total) by mouth daily.  90 tablet  3  . diltiazem (CARDIZEM CD) 120 MG 24 hr capsule Take 1 capsule (120 mg total) by mouth daily.  30 capsule  11  . fish oil-omega-3 fatty acids 1000 MG capsule Take 2 g by mouth daily.        . folic acid (FOLVITE) 400 MCG tablet Take 400 mcg by mouth daily.        . Multiple Vitamins-Minerals (CENTRUM SILVER ULTRA MENS PO) Take 1 tablet by mouth daily.        . niacin (NIASPAN) 1000 MG CR tablet Take 1 tablet (1,000 mg total) by mouth at bedtime.  90 tablet  3  . rosuvastatin (CRESTOR) 10 MG tablet Alternate one tablet with a half tablet daily  90 tablet  3  . sildenafil (VIAGRA) 100 MG tablet Take 1 tablet (100 mg total) by mouth daily as needed.  30 tablet  3    Allergies  Allergen Reactions  . Beta Adrenergic Blockers     REACTION: low blood pressures  . Simvastatin     REACTION: myalgias

## 2012-11-06 NOTE — Assessment & Plan Note (Signed)
Pt's lipoprofile improved except for slight increase in TG.  TC- 139 (goal<200), TG- 170 (goal<150), HDL- 41 (goal>40), LDL- 64 (goal<70), LDL particle- 963 (goal<1000).  LFTs are WNL.  Will continue current medication therapy.  Pt attributes this to dietary changes.  He prefers to work on his diet rather than medications.  I agree.  Discussed following up every 6 months with PCP or Dr. Clifton James but pt would like to continue to come to lipid clinic for lipoprofiles.  Will follow up in 6 months.

## 2012-11-23 ENCOUNTER — Telehealth: Payer: Self-pay | Admitting: Cardiovascular Disease

## 2012-11-23 NOTE — Telephone Encounter (Signed)
I confirmed with pt's pharmacy that cartia is their substitution for Cardizem CD and that this is the cheapest generic.  I spoke with pt who reports he received letter from his insurance suggesting he switch to diltiazem as a cheaper alternative.  I told pt he is on cheapest generic for once a day dosing. He would like to remain on daily dose as he is currently taking.   He reports he woke up one night last week and sat on side of bed before getting up. He then got up and felt extremely dizzy when he was walking to bathroom.  After returning to bed he had left arm throbbing type pain from shoulder to elbow.  This lasted about 2 hours and went away on own.  Dizziness or pain has not happened again. I told pt I would make Dr. Clifton James aware

## 2012-11-23 NOTE — Telephone Encounter (Signed)
Pt would like  Diltiazem to replace cardia due to the expense and its seems to be causing some dizziness, uses [facility] [address],  pls call pt when done  [phone]

## 2012-11-24 NOTE — Telephone Encounter (Signed)
I spoke to the patient last night. He will alert [place] of future issues. cdm

## 2012-12-06 ENCOUNTER — Other Ambulatory Visit: Payer: Self-pay | Admitting: Pharmacist

## 2012-12-06 DIAGNOSIS — E785 Hyperlipidemia, unspecified: Secondary | ICD-10-CM

## 2012-12-24 ENCOUNTER — Encounter: Payer: Self-pay | Admitting: Cardiovascular Disease

## 2012-12-24 DIAGNOSIS — E785 Hyperlipidemia, unspecified: Secondary | ICD-10-CM

## 2012-12-24 DIAGNOSIS — I2510 Atherosclerotic heart disease of native coronary artery without angina pectoris: Secondary | ICD-10-CM

## 2012-12-24 DIAGNOSIS — I4949 Other premature depolarization: Secondary | ICD-10-CM

## 2012-12-25 ENCOUNTER — Other Ambulatory Visit: Payer: Self-pay | Admitting: *Deleted

## 2012-12-25 DIAGNOSIS — I2510 Atherosclerotic heart disease of native coronary artery without angina pectoris: Secondary | ICD-10-CM

## 2012-12-25 DIAGNOSIS — E785 Hyperlipidemia, unspecified: Secondary | ICD-10-CM

## 2012-12-25 MED ORDER — ROSUVASTATIN CALCIUM 10 MG PO TABS: ORAL_TABLET | ORAL | Status: DC

## 2012-12-25 MED ORDER — NIACIN ER (ANTIHYPERLIPIDEMIC) 1000 MG PO TBCR: 1000.0000 mg | EXTENDED_RELEASE_TABLET | Freq: Every day | ORAL | Status: DC

## 2012-12-25 MED ORDER — CLOPIDOGREL BISULFATE 75 MG PO TABS: 75.0000 mg | ORAL_TABLET | Freq: Every day | ORAL | Status: DC

## 2012-12-25 MED ORDER — DILTIAZEM HCL ER COATED BEADS 120 MG PO CP24: 120.0000 mg | ORAL_CAPSULE | Freq: Every day | ORAL | Status: DC

## 2012-12-26 ENCOUNTER — Encounter: Payer: Self-pay | Admitting: Internal Medicine

## 2012-12-29 ENCOUNTER — Encounter: Payer: Self-pay | Admitting: Internal Medicine

## 2012-12-29 ENCOUNTER — Ambulatory Visit (INDEPENDENT_AMBULATORY_CARE_PROVIDER_SITE_OTHER)
Admission: RE | Admit: 2012-12-29 | Discharge: 2012-12-29 | Disposition: A | Discharge status: Home / Self Care | Payer: Medicare Other | Source: Ambulatory Visit | Attending: Internal Medicine | Admitting: Internal Medicine

## 2012-12-29 ENCOUNTER — Ambulatory Visit (INDEPENDENT_AMBULATORY_CARE_PROVIDER_SITE_OTHER): Payer: Medicare Other | Admitting: Internal Medicine

## 2012-12-29 VITALS — BP 112/82 | HR 69 | Temp 97.8°F

## 2012-12-29 DIAGNOSIS — M79645 Pain in left finger(s): Secondary | ICD-10-CM

## 2012-12-29 DIAGNOSIS — M79609 Pain in unspecified limb: Secondary | ICD-10-CM

## 2012-12-29 DIAGNOSIS — S6990XA Unspecified injury of unspecified wrist, hand and finger(s), initial encounter: Secondary | ICD-10-CM | POA: Diagnosis not present

## 2012-12-29 NOTE — Progress Notes (Signed)
  Subjective:    Patient ID: Larry [name], male    DOB: 23-Jun-1932, 79 y.o.   MRN: [id number]  HPI  complains of L hand pain Onset 2 weeks ago precipitated by accidental trauma - tailgate fell onto base of left thumb Not associated with bruising or wound but swelling Pain and weakness with "pincher" motion  Past Medical History  Diagnosis Date  . History of colonic polyps   . ED (erectile dysfunction)   . Diverticulosis of colon   . CAD (coronary artery disease)     S/P CABG in 1999   . Hyperlipidemia     Low HDL  . Decreased exercise tolerance     Exertional fatigue  . Lumbar disc disease     Hx of with recent back pain and buttock pain  . S/P removal of thyroid nodule   . History of MI (myocardial infarction)   . Right knee meniscal tear   . Degenerative arthritis of hip   . Hx of adenomatous colonic polyps 2000    Review of Systems  Constitutional: Negative for fever and fatigue.  Musculoskeletal: Negative for myalgias, joint swelling and arthralgias.  Neurological: Positive for weakness. Negative for headaches.       Objective:   Physical Exam  BP 112/82  Pulse 69  Temp 97.8 F (36.6 C) (Oral)  SpO2 98% Wt Readings from Last 3 Encounters:  10/31/12 175 lb 2 oz (79.436 kg)  09/13/12 174 lb (78.926 kg)  09/08/12 173 lb 9.6 oz (78.744 kg)   Constitutional:  He appears well-developed and well-nourished. No distress.  Neck: Normal range of motion. Neck supple. No JVD present. No thyromegaly present.  Cardiovascular: Normal rate, regular rhythm and normal heart sounds.  No murmur heard. no BLE edema Pulmonary/Chest: Effort normal and breath sounds normal. No respiratory distress. no wheezes.  Musculoskeletal: L thumb CMC with swelling - pain to palpation over thenar eminence - but FROM, ligamentous fx intact.  Neurological: he is alert and oriented to person, place, and time. No cranial nerve deficit. Coordination normal.  Skin: Skin is warm and dry.  No  erythema or ulceration.  Psychiatric: he has a normal mood and affect. behavior is normal. Judgment and thought content normal.   Lab Results  Component Value Date   WBC 6.8 09/08/2012   HGB 15.5 09/08/2012   HCT 45.7 09/08/2012   PLT 178.0 09/08/2012   GLUCOSE 101* 07/01/2011   CHOL 121 10/29/2010   TRIG 170* 10/24/2012   HDL 30.40* 10/29/2010   LDLDIRECT 91.5 02/13/2008   LDLCALC 64 10/24/2012   ALT 20 10/24/2012   AST 24 10/24/2012   NA 139 07/01/2011   K 4.0 07/01/2011   CL 100 07/01/2011   CREATININE 1.2 07/01/2011   BUN 21 07/01/2011   CO2 28 07/01/2011   TSH 1.22 07/01/2011   PSA 0.88 12/30/2011       Assessment & Plan:   L thumb pain at [facility] s/p injury 2 weeks ago Check DG thumb and hand Refer for hand -?PT to consider splint if needed Tylenol 4x/day prn as ongoing

## 2012-12-29 NOTE — Patient Instructions (Signed)
It was good to see you today. Test(s) ordered today. Your results will be released to MyChart (or called to you) after review, usually within 72hours after test completion. If any changes need to be made, you will be notified at that same time. we'll make referral to hand specialist. Our office will contact you regarding appointment(s) once made.

## 2013-01-04 ENCOUNTER — Other Ambulatory Visit: Payer: Self-pay | Admitting: *Deleted

## 2013-01-08 ENCOUNTER — Encounter: Payer: Self-pay | Admitting: Cardiovascular Disease

## 2013-01-09 ENCOUNTER — Other Ambulatory Visit: Payer: Self-pay

## 2013-01-09 DIAGNOSIS — I4949 Other premature depolarization: Secondary | ICD-10-CM

## 2013-01-09 DIAGNOSIS — I2510 Atherosclerotic heart disease of native coronary artery without angina pectoris: Secondary | ICD-10-CM

## 2013-01-09 MED ORDER — DILTIAZEM HCL ER COATED BEADS 120 MG PO CP24: 120.0000 mg | ORAL_CAPSULE | Freq: Every day | ORAL | Status: DC

## 2013-01-09 MED ORDER — CLOPIDOGREL BISULFATE 75 MG PO TABS: 75.0000 mg | ORAL_TABLET | Freq: Every day | ORAL | Status: DC

## 2013-01-09 MED ORDER — NIACIN ER (ANTIHYPERLIPIDEMIC) 1000 MG PO TBCR: 1000.0000 mg | EXTENDED_RELEASE_TABLET | Freq: Every day | ORAL | Status: DC

## 2013-01-09 NOTE — Telephone Encounter (Signed)
Spoke with pt on phone and he states that [facility] is telling him that they did not receive any refill request from this office for him. According to his chart, refills on Niacin, Cardizem and Plavix were ordered on 12/25/12. Refills re sent to [facility] to fill, pt is aware.

## 2013-01-23 DIAGNOSIS — D239 Other benign neoplasm of skin, unspecified: Secondary | ICD-10-CM | POA: Diagnosis not present

## 2013-01-23 DIAGNOSIS — L819 Disorder of pigmentation, unspecified: Secondary | ICD-10-CM | POA: Diagnosis not present

## 2013-01-23 DIAGNOSIS — L821 Other seborrheic keratosis: Secondary | ICD-10-CM | POA: Diagnosis not present

## 2013-01-23 DIAGNOSIS — D485 Neoplasm of uncertain behavior of skin: Secondary | ICD-10-CM | POA: Diagnosis not present

## 2013-01-23 DIAGNOSIS — L570 Actinic keratosis: Secondary | ICD-10-CM | POA: Diagnosis not present

## 2013-01-23 DIAGNOSIS — C44320 Squamous cell carcinoma of skin of unspecified parts of face: Secondary | ICD-10-CM | POA: Diagnosis not present

## 2013-01-23 DIAGNOSIS — D1801 Hemangioma of skin and subcutaneous tissue: Secondary | ICD-10-CM | POA: Diagnosis not present

## 2013-02-13 DIAGNOSIS — C44320 Squamous cell carcinoma of skin of unspecified parts of face: Secondary | ICD-10-CM | POA: Diagnosis not present

## 2013-02-20 ENCOUNTER — Encounter: Payer: Medicare Other | Admitting: Internal Medicine

## 2013-03-12 ENCOUNTER — Encounter: Payer: Self-pay | Admitting: Internal Medicine

## 2013-03-12 ENCOUNTER — Other Ambulatory Visit (INDEPENDENT_AMBULATORY_CARE_PROVIDER_SITE_OTHER): Payer: Medicare Other

## 2013-03-12 ENCOUNTER — Ambulatory Visit (INDEPENDENT_AMBULATORY_CARE_PROVIDER_SITE_OTHER)
Admission: RE | Admit: 2013-03-12 | Discharge: 2013-03-12 | Disposition: A | Discharge status: Home / Self Care | Payer: Medicare Other | Source: Ambulatory Visit | Attending: Internal Medicine | Admitting: Internal Medicine

## 2013-03-12 ENCOUNTER — Ambulatory Visit (INDEPENDENT_AMBULATORY_CARE_PROVIDER_SITE_OTHER): Payer: Medicare Other | Admitting: Internal Medicine

## 2013-03-12 VITALS — BP 120/84 | HR 70 | Temp 97.0°F | Ht 67.0 in | Wt 177.5 lb

## 2013-03-12 DIAGNOSIS — J309 Allergic rhinitis, unspecified: Secondary | ICD-10-CM | POA: Diagnosis not present

## 2013-03-12 DIAGNOSIS — R059 Cough, unspecified: Secondary | ICD-10-CM | POA: Diagnosis not present

## 2013-03-12 DIAGNOSIS — I2510 Atherosclerotic heart disease of native coronary artery without angina pectoris: Secondary | ICD-10-CM | POA: Diagnosis not present

## 2013-03-12 DIAGNOSIS — I10 Essential (primary) hypertension: Secondary | ICD-10-CM | POA: Diagnosis not present

## 2013-03-12 DIAGNOSIS — N320 Bladder-neck obstruction: Secondary | ICD-10-CM | POA: Diagnosis not present

## 2013-03-12 DIAGNOSIS — R05 Cough: Secondary | ICD-10-CM | POA: Insufficient documentation

## 2013-03-12 DIAGNOSIS — R5381 Other malaise: Secondary | ICD-10-CM

## 2013-03-12 DIAGNOSIS — Z23 Encounter for immunization: Secondary | ICD-10-CM

## 2013-03-12 DIAGNOSIS — I4949 Other premature depolarization: Secondary | ICD-10-CM

## 2013-03-12 DIAGNOSIS — E785 Hyperlipidemia, unspecified: Secondary | ICD-10-CM

## 2013-03-12 HISTORY — DX: Allergic rhinitis, unspecified: J30.9

## 2013-03-12 LAB — LIPID PANEL
Cholesterol: 130 mg/dL (ref 0–200)
LDL Cholesterol: 64 mg/dL (ref 0–99)
Triglycerides: 143 mg/dL (ref 0.0–149.0)

## 2013-03-12 LAB — CBC WITH DIFFERENTIAL/PLATELET
Basophils Relative: 0.5 % (ref 0.0–3.0)
Eosinophils Relative: 2.1 % (ref 0.0–5.0)
Lymphocytes Relative: 29.1 % (ref 12.0–46.0)
Monocytes Relative: 11 % (ref 3.0–12.0)
Neutrophils Relative %: 57.3 % (ref 43.0–77.0)
RBC: 5.09 Mil/uL (ref 4.22–5.81)
WBC: 6.6 10*3/uL (ref 4.5–10.5)

## 2013-03-12 LAB — HEPATIC FUNCTION PANEL
ALT: 24 U/L (ref 0–53)
Albumin: 4.2 g/dL (ref 3.5–5.2)
Total Bilirubin: 1.2 mg/dL (ref 0.3–1.2)
Total Protein: 7.3 g/dL (ref 6.0–8.3)

## 2013-03-12 LAB — URINALYSIS, ROUTINE W REFLEX MICROSCOPIC
Hgb urine dipstick: NEGATIVE
Leukocytes, UA: NEGATIVE
Nitrite: NEGATIVE
Total Protein, Urine: NEGATIVE
pH: 6 (ref 5.0–8.0)

## 2013-03-12 LAB — PSA: PSA: 1.16 ng/mL (ref 0.10–4.00)

## 2013-03-12 LAB — BASIC METABOLIC PANEL
BUN: 17 mg/dL (ref 6–23)
CO2: 28 mEq/L (ref 19–32)
Chloride: 103 mEq/L (ref 96–112)
Creatinine, Ser: 1.2 mg/dL (ref 0.4–1.5)
Glucose, Bld: 114 mg/dL — ABNORMAL HIGH (ref 70–99)

## 2013-03-12 LAB — TSH: TSH: 1.04 u[IU]/mL (ref 0.35–5.50)

## 2013-03-12 MED ORDER — ROSUVASTATIN CALCIUM 10 MG PO TABS: ORAL_TABLET | ORAL | Status: DC

## 2013-03-12 MED ORDER — NIACIN ER (ANTIHYPERLIPIDEMIC) 1000 MG PO TBCR: 1000.0000 mg | EXTENDED_RELEASE_TABLET | Freq: Every day | ORAL | Status: DC

## 2013-03-12 MED ORDER — CLOPIDOGREL BISULFATE 75 MG PO TABS: 75.0000 mg | ORAL_TABLET | Freq: Every day | ORAL | Status: DC

## 2013-03-12 MED ORDER — DILTIAZEM HCL ER COATED BEADS 120 MG PO CP24: 120.0000 mg | ORAL_CAPSULE | Freq: Every day | ORAL | Status: DC

## 2013-03-12 MED ORDER — SILDENAFIL CITRATE 100 MG PO TABS: 100.0000 mg | ORAL_TABLET | Freq: Every day | ORAL | Status: DC | PRN

## 2013-03-12 MED ORDER — FLUTICASONE PROPIONATE 50 MCG/ACT NA SUSP: 2.0000 | Freq: Every day | NASAL | Status: DC

## 2013-03-12 NOTE — Progress Notes (Addendum)
Subjective:    Patient ID: Larry [name], male    DOB: 01-25-1932, 79 y.o.   MRN: [id number]  HPI     Here with wife, Here to f/u; overall doing ok,  Pt denies chest pain, increased sob or doe, wheezing, orthopnea, PND, increased LE swelling, palpitations, dizziness or syncope.  Pt denies polydipsia, polyuria, or low sugar symptoms such as weakness or confusion improved with po intake.  Pt denies new neurological symptoms such as new headache, or facial or extremity weakness or numbness.   Pt states overall good compliance with meds, has been trying to follow lower cholesterol diet, with wt overall stable.  Walks 2 miles about 4 times per wk, recently with mild increased fatigue , has to sit halfway through for rest. Pt denies chest pain, increased sob or doe, wheezing, orthopnea, PND, increased LE swelling, palpitations, dizziness or syncope. Last Echo with EF 50-55%.    Also with left upper arm/shoulder throbbing, only at night, can occur with lying on left or right side, no clear radicular pain, no weakness or numbness, mild, declines ortho for now.  Pt continues to have recurring LBP without change in severity, bowel or bladder change, fever, wt loss,  worsening LE pain/numbness/weakness, gait change or falls. Does have 6 mo ongoing nasal allergy symptoms with clearish congestion, itch and sneezing, without fever, pain, ST, swelling or wheezing, but does have minimally prod cough.  Recent freq PVC's tx with ca channell blocker per card,non beta blocker candidate.   Past Medical History  Diagnosis Date  . History of colonic polyps   . ED (erectile dysfunction)   . Diverticulosis of colon   . CAD (coronary artery disease)     S/P CABG in 1999   . Hyperlipidemia     Low HDL  . Decreased exercise tolerance     Exertional fatigue  . Lumbar disc disease     Hx of with recent back pain and buttock pain  . S/P removal of thyroid nodule   . History of MI (myocardial infarction)   . Right knee meniscal  tear   . Degenerative arthritis of hip   . Hx of adenomatous colonic polyps 2000  . Allergic rhinitis, cause unspecified 03/12/2013   Past Surgical History  Procedure Laterality Date  . Coronary artery bypass graft  1999  . Cataract extraction    . Ptca    . Tonsillectomy    . Vasectomy    . Nodules on thyroid    . Lumbar disc surgery      reports that he has never smoked. He has never used smokeless tobacco. He reports that he does not drink alcohol or use illicit drugs. family history includes Heart disease in his brother and mother and Prostate cancer in his others. Allergies  Allergen Reactions  . Beta Adrenergic Blockers     REACTION: low blood pressures  . Simvastatin     REACTION: myalgias   Current Outpatient Prescriptions on File Prior to Visit  Medication Sig Dispense Refill  . acetaminophen (TYLENOL) 650 MG CR tablet Take 650 mg by mouth every 8 (eight) hours as needed.        Marland Kitchen aspirin 81 MG EC tablet Take 81 mg by mouth daily.        . calcium-vitamin D (OSCAL WITH D) 500-200 MG-UNIT per tablet Take 1 tablet by mouth daily.        . fish oil-omega-3 fatty acids 1000 MG capsule Take 2 g by mouth daily.        Marland Kitchen  folic acid (FOLVITE) 400 MCG tablet Take 400 mcg by mouth daily.        . Multiple Vitamins-Minerals (CENTRUM SILVER ULTRA MENS PO) Take 1 tablet by mouth daily.         No current facility-administered medications on file prior to visit.   Review of Systems  Constitutional: Negative for unexpected weight change, or unusual diaphoresis  HENT: Negative for tinnitus.   Eyes: Negative for photophobia and visual disturbance.  Respiratory: Negative for choking and stridor.   Gastrointestinal: Negative for vomiting and blood in stool.  Genitourinary: Negative for hematuria and decreased urine volume.  Musculoskeletal: Negative for acute joint swelling Skin: Negative for color change and wound.  Neurological: Negative for tremors and numbness other than noted   Psychiatric/Behavioral: Negative for decreased concentration or  hyperactivity.       Objective:   Physical Exam BP 120/84  Pulse 70  Temp(Src) 97 F (36.1 C) (Oral)  Ht 5\' 7"  (1.702 m)  Wt 177 lb 8 oz (80.513 kg)  BMI 27.79 kg/m2  SpO2 95% VS noted,  Constitutional: Pt appears well-developed and well-nourished.  HENT: Head: NCAT.  Right Ear: External ear normal.  Left Ear: External ear normal.  Eyes: Conjunctivae and EOM are normal. Pupils are equal, round, and reactive to light.  Neck: Normal range of motion. Neck supple.  Bilat tm's with mild erythema.  Max sinus areas non tender.  Pharynx with mild erythema, no exudate Cardiovascular: Normal rate and regular rhythm.   Pulmonary/Chest: Effort normal and breath sounds normal.  Abd:  Soft, NT, non-distended, + BS Neurological: Pt is alert. Not confused  Skin: Skin is warm. No erythema.  Psychiatric: Pt behavior is normal. Thought content normal.     Assessment & Plan:  Quality Measures addressed:   ACE/ARB therapy for CAD, Diabetes, and/or LVSD: pt declines further medication

## 2013-03-12 NOTE — Assessment & Plan Note (Addendum)
Unclear etiology, I suspect likely CV related but no CP or other symptoms, for labs today,  to f/u any worsening symptoms or concerns  Note:  Total time for pt hx, exam, review of record with pt in the room, determination of diagnoses and plan for further eval and tx is > 40 min, with over 50% spent in coordination and counseling of patient

## 2013-03-12 NOTE — Assessment & Plan Note (Signed)
To refill med per pt request

## 2013-03-12 NOTE — Assessment & Plan Note (Signed)
stable overall by history and exam, recent data reviewed with pt, and pt to continue medical treatment as before,  to f/u any worsening symptoms or concerns Lab Results  Component Value Date   LDLCALC 64 03/12/2013

## 2013-03-12 NOTE — Assessment & Plan Note (Addendum)
Unclear etiology, for cxr but I think most likely related to post nasal gtt and allergies, for further eval if not improved

## 2013-03-12 NOTE — Patient Instructions (Addendum)
You had the tetanus shot today (Td) Please take all new medication as prescribed- the flonase(sent to [facility]) You can also take Zyrtec 10 mg per day (if it doesn't make you too sleepy) or allegra OTC 180 mg per day as needed You are otherwise up to date with prevention measures today. Please continue your efforts at being more active, low cholesterol diet, and weight control. Please continue all other medications as before, and refills have been done if requested. - the plavix, niacin, and crestor to [facility]; viagra hardcopy Please have the pharmacy call with any other refills you may need. Please go to the XRAY Department in the Basement (go straight as you get off the elevator) for the x-ray testing Please go to the LAB in the Basement (turn left off the elevator) for the tests to be done today You will be contacted by phone if any changes need to be made immediately.  Otherwise, you will receive a letter about your results with an explanation, but please check with MyChart first. Thank you for enrolling in MyChart. Please follow the instructions below to securely access your online medical record. MyChart allows you to send messages to your doctor, view your test results, renew your prescriptions, schedule appointments, and more Please return in 1 year for your yearly visit, or sooner if needed, with Lab testing done 3-5 days before

## 2013-03-12 NOTE — Assessment & Plan Note (Signed)
stable overall by history and exam, recent data reviewed with pt, and pt to continue medical treatment as before,  to f/u any worsening symptoms or concerns BP Readings from Last 3 Encounters:  03/12/13 120/84  12/29/12 112/82  09/13/12 115/76

## 2013-03-12 NOTE — Assessment & Plan Note (Signed)
For flonase asd,  to f/u any worsening symptoms or concerns  

## 2013-03-12 NOTE — Assessment & Plan Note (Signed)
Also for PSA as he is due 

## 2013-03-13 ENCOUNTER — Ambulatory Visit: Payer: Medicare Other

## 2013-03-13 DIAGNOSIS — R7309 Other abnormal glucose: Secondary | ICD-10-CM

## 2013-04-03 ENCOUNTER — Other Ambulatory Visit (INDEPENDENT_AMBULATORY_CARE_PROVIDER_SITE_OTHER): Payer: Medicare Other

## 2013-04-03 DIAGNOSIS — E785 Hyperlipidemia, unspecified: Secondary | ICD-10-CM

## 2013-04-03 LAB — HEPATIC FUNCTION PANEL
Albumin: 4.1 g/dL (ref 3.5–5.2)
Total Protein: 7.1 g/dL (ref 6.0–8.3)

## 2013-04-04 LAB — NMR LIPOPROFILE WITH LIPIDS
HDL Particle Number: 27.3 umol/L — ABNORMAL LOW (ref >=30.5)
LDL (calc): 52 mg/dL (ref <100)
LDL Particle Number: 841 nmol/L (ref <1000)
LDL Size: 20 nm — ABNORMAL LOW (ref >20.5)
LP-IR Score: 66 — ABNORMAL HIGH (ref <=45)
Large VLDL-P: 3 nmol/L — ABNORMAL HIGH (ref <=2.7)
Small LDL Particle Number: 532 nmol/L — ABNORMAL HIGH (ref <=527)

## 2013-04-09 ENCOUNTER — Encounter: Payer: Self-pay | Admitting: Pharmacist

## 2013-04-10 ENCOUNTER — Ambulatory Visit: Payer: Medicare Other | Admitting: Pharmacist

## 2013-04-10 ENCOUNTER — Telehealth: Payer: Self-pay | Admitting: Pharmacist

## 2013-04-10 NOTE — Telephone Encounter (Signed)
Spoke with pt on 4/21 regarding results of Lipoprofile.  Pt's labs have improved and are all at goal.  He is tolerating his current therapy of Crestor, niacin and fish oil with no problems.  He requests continuing to follow with the lipid clinic for lipoprofiles.  Will recheck in 6 months.

## 2013-05-24 DIAGNOSIS — M19049 Primary osteoarthritis, unspecified hand: Secondary | ICD-10-CM | POA: Diagnosis not present

## 2013-07-19 DIAGNOSIS — M19049 Primary osteoarthritis, unspecified hand: Secondary | ICD-10-CM | POA: Diagnosis not present

## 2013-07-24 DIAGNOSIS — M545 Low back pain, unspecified: Secondary | ICD-10-CM | POA: Diagnosis not present

## 2013-07-24 DIAGNOSIS — M48061 Spinal stenosis, lumbar region without neurogenic claudication: Secondary | ICD-10-CM | POA: Diagnosis not present

## 2013-08-01 DIAGNOSIS — M48061 Spinal stenosis, lumbar region without neurogenic claudication: Secondary | ICD-10-CM | POA: Diagnosis not present

## 2013-08-21 DIAGNOSIS — Z961 Presence of intraocular lens: Secondary | ICD-10-CM | POA: Diagnosis not present

## 2013-08-21 DIAGNOSIS — H43819 Vitreous degeneration, unspecified eye: Secondary | ICD-10-CM | POA: Diagnosis not present

## 2013-08-21 DIAGNOSIS — H0230 Blepharochalasis unspecified eye, unspecified eyelid: Secondary | ICD-10-CM | POA: Diagnosis not present

## 2013-08-21 DIAGNOSIS — H43399 Other vitreous opacities, unspecified eye: Secondary | ICD-10-CM | POA: Diagnosis not present

## 2013-09-25 ENCOUNTER — Other Ambulatory Visit: Payer: Self-pay | Admitting: Pharmacist

## 2013-09-25 DIAGNOSIS — E785 Hyperlipidemia, unspecified: Secondary | ICD-10-CM

## 2013-09-26 ENCOUNTER — Ambulatory Visit (INDEPENDENT_AMBULATORY_CARE_PROVIDER_SITE_OTHER): Payer: Medicare Other

## 2013-09-26 ENCOUNTER — Ambulatory Visit (INDEPENDENT_AMBULATORY_CARE_PROVIDER_SITE_OTHER): Payer: Medicare Other | Admitting: *Deleted

## 2013-09-26 DIAGNOSIS — Z23 Encounter for immunization: Secondary | ICD-10-CM

## 2013-09-26 DIAGNOSIS — E785 Hyperlipidemia, unspecified: Secondary | ICD-10-CM | POA: Diagnosis not present

## 2013-09-26 LAB — HEPATIC FUNCTION PANEL
ALT: 22 U/L (ref 0–53)
Bilirubin, Direct: 0 mg/dL (ref 0.0–0.3)
Total Bilirubin: 1 mg/dL (ref 0.3–1.2)
Total Protein: 7.2 g/dL (ref 6.0–8.3)

## 2013-09-27 LAB — NMR LIPOPROFILE WITH LIPIDS
HDL Size: 8.5 nm — ABNORMAL LOW (ref >=9.2)
HDL-C: 35 mg/dL — ABNORMAL LOW (ref >=40)
LDL Particle Number: 871 nmol/L (ref <1000)
LDL Size: 19.8 nm — ABNORMAL LOW (ref >20.5)
Large HDL-P: 2.3 umol/L — ABNORMAL LOW (ref >=4.8)
Triglycerides: 156 mg/dL — ABNORMAL HIGH (ref <150)
VLDL Size: 43.4 nm (ref <=46.6)

## 2013-10-02 ENCOUNTER — Ambulatory Visit (INDEPENDENT_AMBULATORY_CARE_PROVIDER_SITE_OTHER): Payer: Medicare Other | Admitting: Pharmacist

## 2013-10-02 DIAGNOSIS — E785 Hyperlipidemia, unspecified: Secondary | ICD-10-CM | POA: Diagnosis not present

## 2013-10-02 NOTE — Progress Notes (Signed)
HPI  Patient presented to clinic today for dyslipidemia follow-up.  Current lipid regimen includes Crestor 10mg  tablets alternating 1 tablet and 1/2 tablet daily, Niaspan 1000mg  QHS and fish oil 1g BID.  Patient has no complains of muscle aches or pains.  His only complaint is the cost of his Niaspan.  He is in the donut hole and it is costing him >$200/month.  He recently went on a 2 week trip up the [facility].   Diet Patient notes that he has not been as strict with reduction in sweets and carbs, that can be attributed to the slight increase in TGs.  This was mostly due to his recent vacation.  He returned just a few days before his labs were drawn.   Exercise Reports walking about a few days of the week. He walks with 1 1/2lb weights in his hands.  He does like to continue walking through the winter months as well.    Current Outpatient Prescriptions  Medication Sig Dispense Refill  . fish oil-omega-3 fatty acids 1000 MG capsule Take 2 g by mouth daily.        . niacin (NIASPAN) 1000 MG CR tablet Take 1 tablet (1,000 mg total) by mouth at bedtime.  90 tablet  3  . rosuvastatin (CRESTOR) 10 MG tablet Alternate one tablet with a half tablet daily  90 tablet  3  . acetaminophen (TYLENOL) 650 MG CR tablet Take 650 mg by mouth every 8 (eight) hours as needed.        Marland Kitchen aspirin 81 MG EC tablet Take 81 mg by mouth daily.        . calcium-vitamin D (OSCAL WITH D) 500-200 MG-UNIT per tablet Take 1 tablet by mouth daily.        . clopidogrel (PLAVIX) 75 MG tablet Take 1 tablet (75 mg total) by mouth daily.  90 tablet  3  . diltiazem (CARDIZEM CD) 120 MG 24 hr capsule Take 1 capsule (120 mg total) by mouth daily.  90 capsule  3  . fluticasone (FLONASE) 50 MCG/ACT nasal spray Place 2 sprays into the nose daily.  16 g  2  . folic acid (FOLVITE) 400 MCG tablet Take 400 mcg by mouth daily.        . Multiple Vitamins-Minerals (CENTRUM SILVER ULTRA MENS PO) Take 1 tablet by mouth daily.        .  sildenafil (VIAGRA) 100 MG tablet Take 1 tablet (100 mg total) by mouth daily as needed.  30 tablet  5   No current facility-administered medications for this visit.   Allergies  Allergen Reactions  . Beta Adrenergic Blockers     REACTION: low blood pressures  . Simvastatin     REACTION: myalgias

## 2013-10-02 NOTE — Assessment & Plan Note (Signed)
Reviewed pt's NMR.  Relatively unchanged in past 6 months.  LDL-P and LDL-C at goal (871 and 67 respectively).  TG slightly elevated at 156 but likely due to dietary changes over the past several weeks. LFTs are within normal limits.  Will have him continue current therapy.  Instructed he may try OTC niacin to help with cost but may cause more flushing than Rx version.  Plan to recheck labs in 6 months and follow up in office at 12 months.

## 2013-10-02 NOTE — Patient Instructions (Signed)
If you want to switch to over the counter Niacin while in the donut hole, take 2 of the 500mg  tablets each day.  If you have any flushing, take your aspirin about 30 minutes prior to the niacin.   Recheck labs in 6 months.  We will call you with the results and see you in 1 year.

## 2014-01-25 DIAGNOSIS — L570 Actinic keratosis: Secondary | ICD-10-CM | POA: Diagnosis not present

## 2014-01-25 DIAGNOSIS — D239 Other benign neoplasm of skin, unspecified: Secondary | ICD-10-CM | POA: Diagnosis not present

## 2014-01-25 DIAGNOSIS — L819 Disorder of pigmentation, unspecified: Secondary | ICD-10-CM | POA: Diagnosis not present

## 2014-01-25 DIAGNOSIS — L738 Other specified follicular disorders: Secondary | ICD-10-CM | POA: Diagnosis not present

## 2014-01-25 DIAGNOSIS — L723 Sebaceous cyst: Secondary | ICD-10-CM | POA: Diagnosis not present

## 2014-01-25 DIAGNOSIS — D1801 Hemangioma of skin and subcutaneous tissue: Secondary | ICD-10-CM | POA: Diagnosis not present

## 2014-01-25 DIAGNOSIS — L821 Other seborrheic keratosis: Secondary | ICD-10-CM | POA: Diagnosis not present

## 2014-02-11 ENCOUNTER — Encounter: Payer: Self-pay | Admitting: Pharmacist

## 2014-02-12 ENCOUNTER — Other Ambulatory Visit: Payer: Self-pay

## 2014-02-12 DIAGNOSIS — I2510 Atherosclerotic heart disease of native coronary artery without angina pectoris: Secondary | ICD-10-CM

## 2014-02-12 MED ORDER — CLOPIDOGREL BISULFATE 75 MG PO TABS: 75.0000 mg | ORAL_TABLET | Freq: Every day | ORAL | Status: DC

## 2014-02-18 ENCOUNTER — Other Ambulatory Visit: Payer: Self-pay

## 2014-02-18 DIAGNOSIS — I4949 Other premature depolarization: Secondary | ICD-10-CM

## 2014-02-18 MED ORDER — DILTIAZEM HCL ER COATED BEADS 120 MG PO CP24: 120.0000 mg | ORAL_CAPSULE | Freq: Every day | ORAL | Status: DC

## 2014-03-15 ENCOUNTER — Other Ambulatory Visit: Payer: Self-pay | Admitting: Internal Medicine

## 2014-03-15 ENCOUNTER — Encounter: Payer: Self-pay | Admitting: Internal Medicine

## 2014-03-15 ENCOUNTER — Ambulatory Visit (INDEPENDENT_AMBULATORY_CARE_PROVIDER_SITE_OTHER): Payer: Medicare Other | Admitting: Internal Medicine

## 2014-03-15 ENCOUNTER — Other Ambulatory Visit (INDEPENDENT_AMBULATORY_CARE_PROVIDER_SITE_OTHER): Payer: Medicare Other

## 2014-03-15 VITALS — BP 112/82 | HR 70 | Temp 97.4°F | Wt 180.8 lb

## 2014-03-15 DIAGNOSIS — R351 Nocturia: Secondary | ICD-10-CM

## 2014-03-15 DIAGNOSIS — N320 Bladder-neck obstruction: Secondary | ICD-10-CM | POA: Diagnosis not present

## 2014-03-15 DIAGNOSIS — Z23 Encounter for immunization: Secondary | ICD-10-CM | POA: Diagnosis not present

## 2014-03-15 DIAGNOSIS — Z136 Encounter for screening for cardiovascular disorders: Secondary | ICD-10-CM

## 2014-03-15 DIAGNOSIS — I10 Essential (primary) hypertension: Secondary | ICD-10-CM | POA: Diagnosis not present

## 2014-03-15 DIAGNOSIS — R3129 Other microscopic hematuria: Secondary | ICD-10-CM

## 2014-03-15 DIAGNOSIS — E785 Hyperlipidemia, unspecified: Secondary | ICD-10-CM

## 2014-03-15 DIAGNOSIS — R5381 Other malaise: Secondary | ICD-10-CM | POA: Diagnosis not present

## 2014-03-15 DIAGNOSIS — I2510 Atherosclerotic heart disease of native coronary artery without angina pectoris: Secondary | ICD-10-CM

## 2014-03-15 DIAGNOSIS — M48061 Spinal stenosis, lumbar region without neurogenic claudication: Secondary | ICD-10-CM | POA: Diagnosis not present

## 2014-03-15 DIAGNOSIS — R5383 Other fatigue: Secondary | ICD-10-CM

## 2014-03-15 HISTORY — DX: Spinal stenosis, lumbar region without neurogenic claudication: M48.061

## 2014-03-15 LAB — URINALYSIS, ROUTINE W REFLEX MICROSCOPIC
Bilirubin Urine: NEGATIVE
KETONES UR: NEGATIVE
Leukocytes, UA: NEGATIVE
Nitrite: NEGATIVE
SPECIFIC GRAVITY, URINE: 1.025 (ref 1.000–1.030)
TOTAL PROTEIN, URINE-UPE24: NEGATIVE
Urine Glucose: NEGATIVE
Urobilinogen, UA: 0.2 (ref 0.0–1.0)
pH: 5.5 (ref 5.0–8.0)

## 2014-03-15 LAB — CBC WITH DIFFERENTIAL/PLATELET
BASOS PCT: 0.6 % (ref 0.0–3.0)
Basophils Absolute: 0 10*3/uL (ref 0.0–0.1)
Eosinophils Absolute: 0.2 10*3/uL (ref 0.0–0.7)
Eosinophils Relative: 3.1 % (ref 0.0–5.0)
HEMATOCRIT: 49.7 % (ref 39.0–52.0)
HEMOGLOBIN: 17.2 g/dL — AB (ref 13.0–17.0)
LYMPHS ABS: 2.1 10*3/uL (ref 0.7–4.0)
LYMPHS PCT: 30.7 % (ref 12.0–46.0)
MCHC: 34.7 g/dL (ref 30.0–36.0)
MCV: 92.7 fl (ref 78.0–100.0)
Monocytes Absolute: 0.8 10*3/uL (ref 0.1–1.0)
Monocytes Relative: 12 % (ref 3.0–12.0)
Neutro Abs: 3.7 10*3/uL (ref 1.4–7.7)
Neutrophils Relative %: 53.6 % (ref 43.0–77.0)
Platelets: 219 10*3/uL (ref 150.0–400.0)
RBC: 5.36 Mil/uL (ref 4.22–5.81)
RDW: 13 % (ref 11.5–14.6)
WBC: 6.8 10*3/uL (ref 4.5–10.5)

## 2014-03-15 LAB — PSA: PSA: 1.12 ng/mL (ref 0.10–4.00)

## 2014-03-15 LAB — BASIC METABOLIC PANEL
BUN: 20 mg/dL (ref 6–23)
CALCIUM: 9.5 mg/dL (ref 8.4–10.5)
CHLORIDE: 105 meq/L (ref 96–112)
CO2: 25 meq/L (ref 19–32)
CREATININE: 1.2 mg/dL (ref 0.4–1.5)
GFR: 64.5 mL/min (ref >60.00)
Glucose, Bld: 103 mg/dL — ABNORMAL HIGH (ref 70–99)
Potassium: 4.1 mEq/L (ref 3.5–5.1)
Sodium: 138 mEq/L (ref 135–145)

## 2014-03-15 LAB — NMR LIPOPROFILE WITH LIPIDS
CHOLESTEROL, TOTAL: 138 mg/dL (ref <200)
HDL PARTICLE NUMBER: 33.9 umol/L (ref >=30.5)
HDL Size: 8.7 nm — ABNORMAL LOW (ref >=9.2)
HDL-C: 40 mg/dL (ref >=40)
LARGE VLDL-P: 3.7 nmol/L — AB (ref <=2.7)
LDL (calc): 64 mg/dL (ref <100)
LDL PARTICLE NUMBER: 875 nmol/L (ref <1000)
LDL SIZE: 19.7 nm — AB (ref >20.5)
LP-IR Score: 64 — ABNORMAL HIGH (ref <=45)
Large HDL-P: 3.3 umol/L — ABNORMAL LOW (ref >=4.8)
SMALL LDL PARTICLE NUMBER: 651 nmol/L — AB (ref <=527)
Triglycerides: 168 mg/dL — ABNORMAL HIGH (ref <150)
VLDL Size: 47 nm — ABNORMAL HIGH (ref <=46.6)

## 2014-03-15 LAB — HEPATIC FUNCTION PANEL
ALBUMIN: 4.5 g/dL (ref 3.5–5.2)
ALT: 30 U/L (ref 0–53)
AST: 28 U/L (ref 0–37)
Alkaline Phosphatase: 36 U/L — ABNORMAL LOW (ref 39–117)
Bilirubin, Direct: 0.1 mg/dL (ref 0.0–0.3)
TOTAL PROTEIN: 7.9 g/dL (ref 6.0–8.3)
Total Bilirubin: 0.9 mg/dL (ref 0.3–1.2)

## 2014-03-15 LAB — TSH: TSH: 1.7 u[IU]/mL (ref 0.35–5.50)

## 2014-03-15 MED ORDER — TAMSULOSIN HCL 0.4 MG PO CAPS: 0.4000 mg | ORAL_CAPSULE | Freq: Every day | ORAL | Status: DC

## 2014-03-15 MED ORDER — SILDENAFIL CITRATE 100 MG PO TABS: 100.0000 mg | ORAL_TABLET | Freq: Every day | ORAL | Status: DC | PRN

## 2014-03-15 NOTE — Assessment & Plan Note (Signed)
Stable overall, to f/u ortho as planned

## 2014-03-15 NOTE — Progress Notes (Signed)
Pre visit review using our clinic review tool, if applicable. No additional management support is needed unless otherwise documented below in the visit note. 

## 2014-03-15 NOTE — Addendum Note (Signed)
Addended by: Sharon Seller B on: 03/15/2014 10:04 AM   Modules accepted: Orders

## 2014-03-15 NOTE — Patient Instructions (Addendum)
You had the new Prevnar pneumonia shot today  Please take all new medication as prescribed - the flomax (start by taking at night before bed for 1 wk, then can take in AM)  Please continue all other medications as before, and refills have been done if requested. Please have the pharmacy call with any other refills you may need.  Please continue your efforts at being more active, low cholesterol diet, and weight control. Please keep your appointments with your specialists as you have planned  Please go to the LAB in the Basement (turn left off the elevator) for the tests to be done today You will be contacted by phone if any changes need to be made immediately.  Otherwise, you will receive a letter about your results with an explanation, but please check with MyChart first.  Please return in 1 year for your yearly visit, or sooner if needed

## 2014-03-15 NOTE — Progress Notes (Signed)
Subjective:    Patient ID: Larry [name], male    DOB: March 06, 1932, 80 y.o.   MRN: [id number]  HPI  Here for yearly f/u;  Overall doing ok;  Pt denies CP, worsening SOB, DOE, wheezing, orthopnea, PND, worsening LE edema, palpitations, dizziness or syncope.  Pt denies neurological change such as new headache, facial or extremity weakness.  Pt denies polydipsia, polyuria, or low sugar symptoms. Pt states overall good compliance with treatment and medications, good tolerability, and has been trying to follow lower cholesterol diet.  Pt denies worsening depressive symptoms, suicidal ideation or panic. No fever, night sweats, wt loss, loss of appetite, or other constitutional symptoms.  Pt states good ability with ADL's, has low fall risk, home safety reviewed and adequate, no other significant changes in hearing or vision, and only occasionally active with exercise.   Pt continues to have recurring LBP without change in severity, bowel or bladder change, fever, wt loss,  worsening LE pain/numbness/weakness, gait change or falls. Has known scoliois and spinal stenosis lumbar.  Still walks some for exercise up to 2 miles, can mow lawn slowly with rest breaks. Wt up only 3 lbs.  Does c/o ongoing fatigue, but denies signficant daytime hypersomnolence.  Has nocturia up to 3-4 times per day. Past Medical History  Diagnosis Date  . History of colonic polyps   . ED (erectile dysfunction)   . Diverticulosis of colon   . CAD (coronary artery disease)     S/P CABG in 1999   . Hyperlipidemia     Low HDL  . Decreased exercise tolerance     Exertional fatigue  . Lumbar disc disease     Hx of with recent back pain and buttock pain  . S/P removal of thyroid nodule   . History of MI (myocardial infarction)   . Right knee meniscal tear   . Degenerative arthritis of hip   . Hx of adenomatous colonic polyps 2000  . Allergic rhinitis, cause unspecified 03/12/2013  . Spinal stenosis of lumbar region 03/15/2014   Past  Surgical History  Procedure Laterality Date  . Coronary artery bypass graft  1999  . Cataract extraction    . Ptca    . Tonsillectomy    . Vasectomy    . Nodules on thyroid    . Lumbar disc surgery      reports that he has never smoked. He has never used smokeless tobacco. He reports that he does not drink alcohol or use illicit drugs. family history includes Heart disease in his brother and mother; Prostate cancer in his other and other. Allergies  Allergen Reactions  . Beta Adrenergic Blockers     REACTION: low blood pressures  . Simvastatin     REACTION: myalgias   Current Outpatient Prescriptions on File Prior to Visit  Medication Sig Dispense Refill  . acetaminophen (TYLENOL) 650 MG CR tablet Take 650 mg by mouth every 8 (eight) hours as needed.        Marland Kitchen aspirin 81 MG EC tablet Take 81 mg by mouth daily.        . calcium-vitamin D (OSCAL WITH D) 500-200 MG-UNIT per tablet Take 1 tablet by mouth daily.        . clopidogrel (PLAVIX) 75 MG tablet Take 1 tablet (75 mg total) by mouth daily.  90 tablet  0  . diltiazem (CARDIZEM CD) 120 MG 24 hr capsule Take 1 capsule (120 mg total) by mouth daily.  90 capsule  0  .  fish oil-omega-3 fatty acids 1000 MG capsule Take 2 g by mouth daily.        . fluticasone (FLONASE) 50 MCG/ACT nasal spray Place 2 sprays into the nose daily.  16 g  2  . folic acid (FOLVITE) 638 MCG tablet Take 400 mcg by mouth daily.        . Multiple Vitamins-Minerals (CENTRUM SILVER ULTRA MENS PO) Take 1 tablet by mouth daily.        . niacin (NIASPAN) 1000 MG CR tablet Take 1 tablet (1,000 mg total) by mouth at bedtime.  90 tablet  3  . rosuvastatin (CRESTOR) 10 MG tablet Alternate one tablet with a half tablet daily  90 tablet  3  . sildenafil (VIAGRA) 100 MG tablet Take 1 tablet (100 mg total) by mouth daily as needed.  30 tablet  5   No current facility-administered medications on file prior to visit.     Review of Systems Constitutional: Negative for  diaphoresis, activity change, appetite change or unexpected weight change.  HENT: Negative for hearing loss, ear pain, facial swelling, mouth sores and neck stiffness.   Eyes: Negative for pain, redness and visual disturbance.  Respiratory: Negative for shortness of breath and wheezing.   Cardiovascular: Negative for chest pain and palpitations.  Gastrointestinal: Negative for diarrhea, blood in stool, abdominal distention or other pain Genitourinary: Negative for hematuria, flank pain or change in urine volume.  Musculoskeletal: Negative for myalgias and joint swelling.  Skin: Negative for color change and wound.  Neurological: Negative for syncope and numbness. other than noted Hematological: Negative for adenopathy.  Psychiatric/Behavioral: Negative for hallucinations, self-injury, decreased concentration and agitation.      Objective:   Physical Exam BP 112/82  Pulse 70  Temp(Src) 97.4 F (36.3 C) (Oral)  Wt 180 lb 12 oz (81.988 kg)  SpO2 96% VS noted,  Constitutional: Pt is oriented to person, place, and time. Appears well-developed and well-nourished.  Head: Normocephalic and atraumatic.  Right Ear: External ear normal.  Left Ear: External ear normal.  Nose: Nose normal.  Mouth/Throat: Oropharynx is clear and moist.  Eyes: Conjunctivae and EOM are normal. Pupils are equal, round, and reactive to light.  Neck: Normal range of motion. Neck supple. No JVD present. No tracheal deviation present.  Cardiovascular: Normal rate, regular rhythm, normal heart sounds and intact distal pulses.   Pulmonary/Chest: Effort normal and breath sounds normal.  Abdominal: Soft. Bowel sounds are normal. There is no tenderness. No HSM  Musculoskeletal: Normal range of motion. Exhibits no edema.  Lymphadenopathy:  Has no cervical adenopathy.  Neurological: Pt is alert and oriented to person, place, and time. Pt has normal reflexes. No cranial nerve deficit.  Skin: Skin is warm and dry. No rash  noted.  Psychiatric:  Has  normal mood and affect. Behavior is normal.  Left thumb with chronic dislocation PIP, NT, no effusion    Assessment & Plan:

## 2014-03-15 NOTE — Assessment & Plan Note (Signed)
Also for psa as he is due 

## 2014-03-15 NOTE — Assessment & Plan Note (Signed)
Etiology unclear, Exam otherwise benign, to check labs as documented, follow with expectant management  Note:  Total time for pt hx, exam, review of record with pt in the room, determination of diagnoses and plan for further eval and tx is > 40 min, with over 50% spent in coordination and counseling of patient  

## 2014-03-15 NOTE — Addendum Note (Signed)
Addended by: Sharon Seller B on: 03/15/2014 04:26 PM   Modules accepted: Orders

## 2014-03-15 NOTE — Addendum Note (Signed)
Addended by: Biagio Borg on: 03/15/2014 10:01 AM   Modules accepted: Orders

## 2014-03-15 NOTE — Assessment & Plan Note (Addendum)
ECG reviewed as per emr, stable overall by history and exam, recent data reviewed with pt, and pt to continue medical treatment as before,  to f/u any worsening symptoms or concerns BP Readings from Last 3 Encounters:  03/15/14 112/82  03/12/13 120/84  12/29/12 112/82

## 2014-03-15 NOTE — Assessment & Plan Note (Signed)
stable overall by history and exam, recent data reviewed with pt, and pt to continue medical treatment as before,  to f/u any worsening symptoms or concerns Lab Results  Component Value Date   LDLCALC 67 09/26/2013

## 2014-04-03 ENCOUNTER — Ambulatory Visit (INDEPENDENT_AMBULATORY_CARE_PROVIDER_SITE_OTHER): Payer: Medicare Other | Admitting: *Deleted

## 2014-04-03 DIAGNOSIS — R351 Nocturia: Secondary | ICD-10-CM | POA: Diagnosis not present

## 2014-04-03 DIAGNOSIS — I10 Essential (primary) hypertension: Secondary | ICD-10-CM

## 2014-04-03 DIAGNOSIS — I2510 Atherosclerotic heart disease of native coronary artery without angina pectoris: Secondary | ICD-10-CM | POA: Diagnosis not present

## 2014-04-03 DIAGNOSIS — R3129 Other microscopic hematuria: Secondary | ICD-10-CM | POA: Diagnosis not present

## 2014-04-03 DIAGNOSIS — E785 Hyperlipidemia, unspecified: Secondary | ICD-10-CM | POA: Diagnosis not present

## 2014-04-03 LAB — HEPATIC FUNCTION PANEL
ALBUMIN: 3.8 g/dL (ref 3.5–5.2)
ALT: 22 U/L (ref 0–53)
AST: 21 U/L (ref 0–37)
Alkaline Phosphatase: 32 U/L — ABNORMAL LOW (ref 39–117)
Bilirubin, Direct: 0.1 mg/dL (ref 0.0–0.3)
Total Bilirubin: 1.1 mg/dL (ref 0.3–1.2)
Total Protein: 7 g/dL (ref 6.0–8.3)

## 2014-04-04 LAB — NMR LIPOPROFILE WITH LIPIDS
Cholesterol, Total: 118 mg/dL (ref <200)
HDL Particle Number: 26.8 umol/L — ABNORMAL LOW (ref >=30.5)
HDL SIZE: 8.6 nm — AB (ref >=9.2)
HDL-C: 36 mg/dL — ABNORMAL LOW (ref >=40)
LDL CALC: 50 mg/dL (ref <100)
LDL PARTICLE NUMBER: 765 nmol/L (ref <1000)
LDL SIZE: 20.1 nm — AB (ref >20.5)
LP-IR SCORE: 67 — AB (ref <=45)
Large HDL-P: 2 umol/L — ABNORMAL LOW (ref >=4.8)
Large VLDL-P: 2 nmol/L (ref <=2.7)
SMALL LDL PARTICLE NUMBER: 489 nmol/L (ref <=527)
Triglycerides: 162 mg/dL — ABNORMAL HIGH (ref <150)
VLDL SIZE: 48 nm — AB (ref <=46.6)

## 2014-04-05 ENCOUNTER — Encounter: Payer: Self-pay | Admitting: Cardiovascular Disease

## 2014-04-05 ENCOUNTER — Ambulatory Visit (INDEPENDENT_AMBULATORY_CARE_PROVIDER_SITE_OTHER): Payer: Medicare Other | Admitting: Cardiovascular Disease

## 2014-04-05 VITALS — BP 106/76 | HR 64 | Ht 68.0 in | Wt 180.0 lb

## 2014-04-05 DIAGNOSIS — I2510 Atherosclerotic heart disease of native coronary artery without angina pectoris: Secondary | ICD-10-CM | POA: Diagnosis not present

## 2014-04-05 DIAGNOSIS — I4949 Other premature depolarization: Secondary | ICD-10-CM

## 2014-04-05 DIAGNOSIS — E785 Hyperlipidemia, unspecified: Secondary | ICD-10-CM | POA: Diagnosis not present

## 2014-04-05 DIAGNOSIS — I493 Ventricular premature depolarization: Secondary | ICD-10-CM

## 2014-04-05 MED ORDER — ROSUVASTATIN CALCIUM 10 MG PO TABS: ORAL_TABLET | ORAL | Status: DC

## 2014-04-05 NOTE — Progress Notes (Signed)
History of Present Illness: 80 yo WM with history of CAD, HLD here for cardiac follow up. He has been followed in the past by Dr. Olevia Perches. He had 3V CABG in 1999 at [facility] in [place]. In 2008 he had a catheterization and was found to have a patent LIMA to LAD and a patent vein graft to the right coronary and no significant obstruction in the circumflex artery. He has been doing well since that time. Exercise stress test July 2012 with no ischemia. LVEF 50%. He has had PVCs noted on cardiac monitor and was started on Cardizem. He has had some dizziness and fatigue over the last six months. NO chest pain.  He is not able to walk as far as before without having profound fatigue.    Primary Care Physician: Cathlean Cower  Last Lipid Profile:   Lipid Panel     Component Value Date/Time   CHOL 130 03/12/2013 1145   TRIG 162* 04/03/2014 0843   TRIG 143.0 03/12/2013 1145   HDL 37.10* 03/12/2013 1145   CHOLHDL 4 03/12/2013 1145   VLDL 28.6 03/12/2013 1145   LDLCALC 50 04/03/2014 0843   LDLCALC 64 03/12/2013 1145     Past Medical History  Diagnosis Date  . History of colonic polyps   . ED (erectile dysfunction)   . Diverticulosis of colon   . CAD (coronary artery disease)     S/P CABG in 1999   . Hyperlipidemia     Low HDL  . Decreased exercise tolerance     Exertional fatigue  . Lumbar disc disease     Hx of with recent back pain and buttock pain  . S/P removal of thyroid nodule   . History of MI (myocardial infarction)   . Right knee meniscal tear   . Degenerative arthritis of hip   . Hx of adenomatous colonic polyps 2000  . Allergic rhinitis, cause unspecified 03/12/2013  . Spinal stenosis of lumbar region 03/15/2014    Past Surgical History  Procedure Laterality Date  . Coronary artery bypass graft  1999  . Cataract extraction    . Ptca    . Tonsillectomy    . Vasectomy    . Nodules on thyroid    . Lumbar disc surgery      Current Outpatient Prescriptions    Medication Sig Dispense Refill  . acetaminophen (TYLENOL) 650 MG CR tablet Take 650 mg by mouth every 8 (eight) hours as needed.        Marland Kitchen aspirin 81 MG EC tablet Take 81 mg by mouth daily.        . calcium-vitamin D (OSCAL WITH D) 500-200 MG-UNIT per tablet Take 1 tablet by mouth daily.        . clopidogrel (PLAVIX) 75 MG tablet Take 1 tablet (75 mg total) by mouth daily.  90 tablet  0  . diltiazem (CARDIZEM CD) 120 MG 24 hr capsule Take 1 capsule (120 mg total) by mouth daily.  90 capsule  0  . fish oil-omega-3 fatty acids 1000 MG capsule Take 2 g by mouth daily.        . folic acid (FOLVITE) 440 MCG tablet Take 400 mcg by mouth daily.        . Multiple Vitamins-Minerals (CENTRUM SILVER ULTRA MENS PO) Take 1 tablet by mouth daily.        . niacin (NIASPAN) 1000 MG CR tablet Take 1 tablet (1,000 mg total) by mouth at bedtime.  90 tablet  3  . rosuvastatin (CRESTOR) 10 MG tablet Alternate one tablet with a half tablet daily  90 tablet  3  . sildenafil (VIAGRA) 100 MG tablet Take 1 tablet (100 mg total) by mouth daily as needed.  30 tablet  5   No current facility-administered medications for this visit.    Allergies  Allergen Reactions  . Beta Adrenergic Blockers     REACTION: low blood pressures  . Simvastatin     REACTION: myalgias    History   Social History  . Marital Status: Married    Spouse Name: N/A    Number of Children: 2  . Years of Education: N/A   Occupational History  . 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 Smoking status: Never Smoker   . Smokeless tobacco: Never Used  . Alcohol Use: No  . Drug Use: No  . Sexual Activity: Not on file   Other Topics Concern  . Not on file   Social History Narrative   Moved from [facility]   Married      [employer] on file    Family History  Problem Relation Age of Onset  . Prostate cancer Other   . Prostate cancer Other   . Heart disease Mother   . Heart disease Brother      Review of Systems:  As stated in the HPI and otherwise negative.   BP 106/76  Pulse 64  Ht 5\' 8"  (1.727 m)  Wt 180 lb (81.647 kg)  BMI 27.38 kg/m2  Physical Examination: General: Well developed, well nourished, NAD HEENT: OP clear, mucus membranes moist SKIN: warm, dry. No rashes. Neuro: No focal deficits Musculoskeletal: Muscle strength 5/5 all ext Psychiatric: Mood and affect normal Neck: No JVD, no carotid bruits, no thyromegaly, no lymphadenopathy. Lungs:Clear bilaterally, no wheezes, rhonci, crackles Cardiovascular: Regular rate and rhythm. No murmurs, gallops or rubs. Abdomen:Soft. Bowel sounds present. Non-tender.  Extremities: No lower extremity edema. Pulses are 2 + in the bilateral DP/PT.  Exercise myoview 07/07/11: Stress Procedure: The patient exercised for 6:00. The patient stopped due to fatigue, sob, and denied any chest pain. There were no significant ST-T wave changes and occ pvcs/pacs. Technetium 53m Tetrofosmin was injected at peak exercise and myocardial perfusion imaging was performed after a brief delay.  Stress ECG: No significant change from baseline ECG  QPS  Raw Data Images: Normal; no motion artifact; normal heart/lung ratio.  Stress Images: Moderate basal to mid inferior perfusion defect.  Rest Images: Moderate basal to mid inferior perfusion defect.  Subtraction (SDS): Fixed basal to mid inferior perfusion defect.  Transient Ischemic Dilatation (Normal <1.22): 0.96  Lung/Heart Ratio (Normal <0.45): 0.36  Quantitative Gated Spect Images  QGS EDV: 102 ml  QGS ESV: 52 ml  QGS cine images: Mild global hypokinesis.  QGS EF: 50%  Impression  Exercise Capacity: Fair exercise capacity.  BP Response: Normal blood pressure response.  Clinical Symptoms: Shortness of breath, no chest pain.  ECG Impression: Baseline slight ST depression in anterolateral leads, no significant change with exertion.  Comparison with Prior Nuclear Study: No significant  change from previous study  Overall Impression: Low risk stress nuclear study. Moderate fixed basal to mid inferior perfusion defect suggestive of prior MI. No evidence for ischemia.   Echo 10/13: Left ventricle: The cavity size was mildly dilated. Wall thickness was increased in a pattern of mild LVH. Systolic function was normal. The estimated ejection fraction  was in the range of 50% to 55%. Wall motion was normal; there were no regional wall motion abnormalities. - Aortic valve: Trivial regurgitation. - Mitral valve: Mild regurgitation. - Left atrium: The atrium was mildly dilated. - Atrial septum: No defect or patent foramen ovale was identified.  Assessment and Plan:   1. CORONARY ARTERY DISEASE: He is on good medications. Recent fatigue. No chest pain. He is due for a stress test. Will arrange exercise stress myoview to exclude ischemia. Last stress test in 2012. Last cath in 2008. BP and lipids controlled. Will stop Cardizem due to fatigue.   2. PVCs: Asymptomatic. He has had dizziness and fatigue. Will stop Cardizem with fatigue and dizziness, hypotension.   3. HLD: Followed in lipid clinic. Reviewed NMR profile today. Continue current therapy

## 2014-04-05 NOTE — Patient Instructions (Signed)
Your physician wants you to follow-up in: 6 months.   You will receive a reminder letter in the mail two months in advance. If you don't receive a letter, please call our office to schedule the follow-up appointment.   Your physician has requested that you have an exercise stress myoview. For further information please visit [email or web address]. Please follow instruction sheet, as given.  Your physician has recommended you make the following change in your medication:  Stop Cardizem

## 2014-04-09 ENCOUNTER — Ambulatory Visit (HOSPITAL_COMMUNITY): Payer: Medicare Other | Attending: Cardiovascular Disease | Admitting: Radiology

## 2014-04-09 VITALS — BP 130/83 | HR 67 | Ht 68.0 in | Wt 178.0 lb

## 2014-04-09 DIAGNOSIS — I2510 Atherosclerotic heart disease of native coronary artery without angina pectoris: Secondary | ICD-10-CM | POA: Diagnosis not present

## 2014-04-09 MED ORDER — TECHNETIUM TC 99M SESTAMIBI GENERIC - CARDIOLITE
33.0000 | Freq: Once | INTRAVENOUS | Status: AC | PRN
  Administered 2014-04-09: 33 via INTRAVENOUS

## 2014-04-09 MED ORDER — TECHNETIUM TC 99M SESTAMIBI GENERIC - CARDIOLITE
11.0000 | Freq: Once | INTRAVENOUS | Status: AC | PRN
  Administered 2014-04-09: 11 via INTRAVENOUS

## 2014-04-09 NOTE — Progress Notes (Signed)
Larry [name] [address] [place], [place] [address] [id number]    Cardiology Nuclear Med Study  Larry [name] is a 80 y.o. male     MRN : [id number]     DOB: [birth year]  Procedure Date: 04/09/2014  Nuclear Med Background Indication for Stress Test:  Evaluation for Ischemia and Graft Patency History:  CAD, MI, Cath, CABG x3, Echo 2013 EF 50-55%, MPI 2012 (scar) EF 50% Cardiac Risk Factors: Family History - CAD, Hypertension and Lipids  Symptoms:  Dizziness and Fatigue   Nuclear Pre-Procedure Caffeine/Decaff Intake:  None > 12 hrs NPO After: 7:00pm   Lungs:  clear O2 Sat: 98% on room air. IV 0.9% NS with Angio Cath:  22g  IV Site: R Antecubital x 1, tolerated well IV Started by:  Irven Baltimore, RN  Chest Size (in):  38 Cup Size: n/a  Height: 5\' 8"  (1.727 m)  Weight:  178 lb (80.74 kg)  BMI:  Body mass index is 27.07 kg/(m^2). Tech Comments:  No medications today    Nuclear Med Study 1 or 2 day study: 1 day  Stress Test Type:  Stress  Reading MD: N/A  Order Authorizing Provider:  Lauree Chandler, MD  Resting Radionuclide: Technetium 51m Sestamibi  Resting Radionuclide Dose: 11.0 mCi   Stress Radionuclide:  Technetium 58m Sestamibi  Stress Radionuclide Dose: 33.0 mCi           Stress Protocol Rest HR: 67 Stress HR: 127  Rest BP: 130/83 Stress BP: 155.85  Exercise Time (min): 6:00 METS: 7.0           Dose of Adenosine (mg):  n/a Dose of Lexiscan: n/a mg  Dose of Atropine (mg): n/a Dose of Dobutamine: n/a mcg/kg/min (at max HR)  Stress Test Technologist: Glade Lloyd, BS-ES  Nuclear Technologist:  Charlton Amor, CNMT     Rest Procedure:  Myocardial perfusion imaging was performed at rest 45 minutes following the intravenous administration of Technetium 73m Sestamibi. Rest ECG: Sinus bradycardia, bigeminy  Stress Procedure:  The patient exercised on the treadmill utilizing the [name] Protocol for 6:00 minutes. The patient stopped  due to fatigue and denied any chest pain.  Technetium 37m Sestamibi was injected at peak exercise and myocardial perfusion imaging was performed after a brief delay. Stress ECG: No significant change from baseline ECG, very frequent PVCs  QPS Raw Data Images:  Normal; no motion artifact; normal heart/lung ratio. Stress Images:  A large size severe severity perfusion defect in the entire inferior, basal and mid inferolateral and basal inferior walls.  Rest Images:  A large size severe severity perfusion defect in the entire inferior, basal and mid inferolateral and basal inferior walls.  Subtraction (SDS):  No ischemia.  Transient Ischemic Dilatation (Normal <1.22):  0.97 Lung/Heart Ratio (Normal <0.45):  0.43  Quantitative Gated Spect Images QGS EDV:  n/a QGS ESV:  n/a  Impression Exercise Capacity:  Lexiscan with no exercise. BP Response:  Normal blood pressure response. Clinical Symptoms:  No symptoms. ECG Impression:  No significant ST segment change suggestive of ischemia.very frequent PVCs.  Comparison with Prior Nuclear Study: No change when compared to study from 07/07/11.  Overall Impression:  Intermediate risk stress nuclear study with a large scar in the RCA territory, no ischemia. .  LV Ejection Fraction: Study not gated.  LV Wall Motion:  Study not gated  Dorothy Spark 04/09/2014

## 2014-04-10 ENCOUNTER — Telehealth: Payer: Self-pay | Admitting: Cardiovascular Disease

## 2014-04-10 ENCOUNTER — Encounter: Payer: Self-pay | Admitting: Internal Medicine

## 2014-04-10 NOTE — Telephone Encounter (Signed)
New Message:  Pt states he is returning a call to [facility]

## 2014-04-10 NOTE — Telephone Encounter (Signed)
Spoke with pt and reviewed stress test results with him.  

## 2014-04-11 ENCOUNTER — Other Ambulatory Visit: Payer: Self-pay | Admitting: *Deleted

## 2014-04-11 DIAGNOSIS — I2510 Atherosclerotic heart disease of native coronary artery without angina pectoris: Secondary | ICD-10-CM

## 2014-04-11 MED ORDER — CLOPIDOGREL BISULFATE 75 MG PO TABS: 75.0000 mg | ORAL_TABLET | Freq: Every day | ORAL | Status: DC

## 2014-04-30 DIAGNOSIS — D4959 Neoplasm of unspecified behavior of other genitourinary organ: Secondary | ICD-10-CM | POA: Diagnosis not present

## 2014-04-30 DIAGNOSIS — R3129 Other microscopic hematuria: Secondary | ICD-10-CM | POA: Diagnosis not present

## 2014-05-06 ENCOUNTER — Encounter (HOSPITAL_COMMUNITY): Payer: Self-pay | Admitting: Emergency Medicine

## 2014-05-06 ENCOUNTER — Emergency Department (HOSPITAL_COMMUNITY): Payer: Medicare Other

## 2014-05-06 ENCOUNTER — Emergency Department (HOSPITAL_COMMUNITY)
Admission: EM | Admit: 2014-05-06 | Discharge: 2014-05-06 | Disposition: A | Discharge status: Home / Self Care | Payer: Medicare Other | Attending: Emergency Medicine | Admitting: Emergency Medicine

## 2014-05-06 DIAGNOSIS — Z8739 Personal history of other diseases of the musculoskeletal system and connective tissue: Secondary | ICD-10-CM | POA: Insufficient documentation

## 2014-05-06 DIAGNOSIS — E785 Hyperlipidemia, unspecified: Secondary | ICD-10-CM | POA: Insufficient documentation

## 2014-05-06 DIAGNOSIS — N23 Unspecified renal colic: Secondary | ICD-10-CM | POA: Diagnosis not present

## 2014-05-06 DIAGNOSIS — Z79899 Other long term (current) drug therapy: Secondary | ICD-10-CM | POA: Diagnosis not present

## 2014-05-06 DIAGNOSIS — N529 Male erectile dysfunction, unspecified: Secondary | ICD-10-CM | POA: Insufficient documentation

## 2014-05-06 DIAGNOSIS — Z7901 Long term (current) use of anticoagulants: Secondary | ICD-10-CM | POA: Insufficient documentation

## 2014-05-06 DIAGNOSIS — N201 Calculus of ureter: Secondary | ICD-10-CM | POA: Diagnosis not present

## 2014-05-06 DIAGNOSIS — IMO0002 Reserved for concepts with insufficient information to code with codable children: Secondary | ICD-10-CM | POA: Diagnosis not present

## 2014-05-06 DIAGNOSIS — I2510 Atherosclerotic heart disease of native coronary artery without angina pectoris: Secondary | ICD-10-CM | POA: Insufficient documentation

## 2014-05-06 DIAGNOSIS — Z860100 Personal history of colon polyps, unspecified: Secondary | ICD-10-CM | POA: Insufficient documentation

## 2014-05-06 DIAGNOSIS — Z951 Presence of aortocoronary bypass graft: Secondary | ICD-10-CM | POA: Diagnosis not present

## 2014-05-06 DIAGNOSIS — I252 Old myocardial infarction: Secondary | ICD-10-CM | POA: Diagnosis not present

## 2014-05-06 DIAGNOSIS — R1084 Generalized abdominal pain: Secondary | ICD-10-CM | POA: Diagnosis not present

## 2014-05-06 DIAGNOSIS — Z7902 Long term (current) use of antithrombotics/antiplatelets: Secondary | ICD-10-CM | POA: Insufficient documentation

## 2014-05-06 DIAGNOSIS — Z7982 Long term (current) use of aspirin: Secondary | ICD-10-CM | POA: Insufficient documentation

## 2014-05-06 DIAGNOSIS — R109 Unspecified abdominal pain: Secondary | ICD-10-CM

## 2014-05-06 DIAGNOSIS — I10 Essential (primary) hypertension: Secondary | ICD-10-CM | POA: Diagnosis not present

## 2014-05-06 LAB — COMPREHENSIVE METABOLIC PANEL
ALBUMIN: 4.1 g/dL (ref 3.5–5.2)
ALK PHOS: 39 U/L (ref 39–117)
ALT: 29 U/L (ref 0–53)
AST: 32 U/L (ref 0–37)
BILIRUBIN TOTAL: 0.7 mg/dL (ref 0.3–1.2)
BUN: 26 mg/dL — AB (ref 6–23)
CO2: 24 mEq/L (ref 19–32)
Calcium: 9.5 mg/dL (ref 8.4–10.5)
Chloride: 104 mEq/L (ref 96–112)
Creatinine, Ser: 1.48 mg/dL — ABNORMAL HIGH (ref 0.50–1.35)
GFR calc Af Amer: 50 mL/min — ABNORMAL LOW (ref >90)
GFR calc non Af Amer: 43 mL/min — ABNORMAL LOW (ref >90)
GLUCOSE: 123 mg/dL — AB (ref 70–99)
POTASSIUM: 4.3 meq/L (ref 3.7–5.3)
Sodium: 142 mEq/L (ref 137–147)
Total Protein: 7.5 g/dL (ref 6.0–8.3)

## 2014-05-06 LAB — CBC WITH DIFFERENTIAL/PLATELET
BASOS PCT: 0 % (ref 0–1)
Basophils Absolute: 0 10*3/uL (ref 0.0–0.1)
Eosinophils Absolute: 0.1 10*3/uL (ref 0.0–0.7)
Eosinophils Relative: 1 % (ref 0–5)
HCT: 46.1 % (ref 39.0–52.0)
HEMOGLOBIN: 16 g/dL (ref 13.0–17.0)
LYMPHS ABS: 2.6 10*3/uL (ref 0.7–4.0)
Lymphocytes Relative: 30 % (ref 12–46)
MCH: 32.2 pg (ref 26.0–34.0)
MCHC: 34.7 g/dL (ref 30.0–36.0)
MCV: 92.8 fL (ref 78.0–100.0)
MONOS PCT: 9 % (ref 3–12)
Monocytes Absolute: 0.8 10*3/uL (ref 0.1–1.0)
NEUTROS ABS: 5.3 10*3/uL (ref 1.7–7.7)
Neutrophils Relative %: 60 % (ref 43–77)
Platelets: 178 10*3/uL (ref 150–400)
RBC: 4.97 MIL/uL (ref 4.22–5.81)
RDW: 13 % (ref 11.5–15.5)
WBC: 8.8 10*3/uL (ref 4.0–10.5)

## 2014-05-06 LAB — URINALYSIS, ROUTINE W REFLEX MICROSCOPIC
Bilirubin Urine: NEGATIVE
GLUCOSE, UA: NEGATIVE mg/dL
HGB URINE DIPSTICK: NEGATIVE
Ketones, ur: NEGATIVE mg/dL
LEUKOCYTES UA: NEGATIVE
Nitrite: NEGATIVE
PROTEIN: NEGATIVE mg/dL
Specific Gravity, Urine: 1.02 (ref 1.005–1.030)
Urobilinogen, UA: 0.2 mg/dL (ref 0.0–1.0)
pH: 6 (ref 5.0–8.0)

## 2014-05-06 LAB — I-STAT TROPONIN, ED: Troponin i, poc: 0 ng/mL (ref 0.00–0.08)

## 2014-05-06 LAB — I-STAT CG4 LACTIC ACID, ED: Lactic Acid, Venous: 1.24 mmol/L (ref 0.5–2.2)

## 2014-05-06 MED ORDER — METRONIDAZOLE IN NACL 5-0.79 MG/ML-% IV SOLN
500.0000 mg | Freq: Once | INTRAVENOUS | Status: AC
  Administered 2014-05-06: 500 mg via INTRAVENOUS
  Filled 2014-05-06: qty 100

## 2014-05-06 MED ORDER — MORPHINE SULFATE 4 MG/ML IJ SOLN
4.0000 mg | Freq: Once | INTRAMUSCULAR | Status: AC
  Administered 2014-05-06: 4 mg via INTRAVENOUS
  Filled 2014-05-06: qty 1

## 2014-05-06 MED ORDER — IOHEXOL 300 MG/ML  SOLN
25.0000 mL | INTRAMUSCULAR | Status: AC
  Administered 2014-05-06: 50 mL via ORAL

## 2014-05-06 MED ORDER — OXYCODONE-ACETAMINOPHEN 5-325 MG PO TABS: 1.0000 | ORAL_TABLET | Freq: Four times a day (QID) | ORAL | Status: DC | PRN

## 2014-05-06 MED ORDER — ONDANSETRON HCL 4 MG/2ML IJ SOLN
4.0000 mg | Freq: Once | INTRAMUSCULAR | Status: AC
  Administered 2014-05-06: 4 mg via INTRAVENOUS
  Filled 2014-05-06: qty 2

## 2014-05-06 MED ORDER — CIPROFLOXACIN IN D5W 200 MG/100ML IV SOLN
200.0000 mg | Freq: Once | INTRAVENOUS | Status: AC
  Administered 2014-05-06: 200 mg via INTRAVENOUS
  Filled 2014-05-06: qty 100

## 2014-05-06 MED ORDER — TAMSULOSIN HCL 0.4 MG PO CAPS: 0.4000 mg | ORAL_CAPSULE | Freq: Every day | ORAL | Status: DC

## 2014-05-06 MED ORDER — IOHEXOL 300 MG/ML  SOLN
80.0000 mL | Freq: Once | INTRAMUSCULAR | Status: AC | PRN
  Administered 2014-05-06: 80 mL via INTRAVENOUS

## 2014-05-06 MED ORDER — FAMOTIDINE IN NACL 20-0.9 MG/50ML-% IV SOLN
20.0000 mg | Freq: Once | INTRAVENOUS | Status: AC
  Administered 2014-05-06: 20 mg via INTRAVENOUS
  Filled 2014-05-06: qty 50

## 2014-05-06 MED ORDER — SODIUM CHLORIDE 0.9 % IV BOLUS (SEPSIS)
1000.0000 mL | Freq: Once | INTRAVENOUS | Status: AC
  Administered 2014-05-06: 1000 mL via INTRAVENOUS

## 2014-05-06 NOTE — Discharge Instructions (Signed)
Rest. Drink plenty of fluids. Take flomax as prescribed. You may take percocet as need for pain. No driving for the next 6 hours or when taking percocet. Also, do not take tylenol or acetaminophen containing medication when taking percocet.    Your ct scan was read as follows:  IMPRESSION: There is a 2 mm calculus in the distal left ureter, not causing appreciable hydronephrosis. This calculus is best seen in the dependent portion of the distal ureter on axial slice 88. It is also seen on coronal slice 78, series 099.  There is a Bosniak II cyst in the lower pole of the left kidney. This finding warrants a followup study in approximately 6 months to confirm stability.  There is right base lung infiltrate. There is left base atelectatic change.  There is no bowel obstruction. No abscess. Appendix appears normal. There are sigmoid diverticula without diverticulitis.   For your recent blood in urine, today's kidney stone, and kidney cyst, follow up with urologist in the coming week.  Contact them to discuss todays ct scan as they may not still need the previously ordered imaging.   For chest 'infiltrate' noted on CT scan - follow up with your doctor in the next few weeks, and discuss follow up imaging as outpatient.  Return to ER should symptoms develop, including fevers, cough, trouble breathing or chest pain.     Kidney Stones Kidney stones (urolithiasis) are deposits that form inside your kidneys. The intense pain is caused by the stone moving through the urinary tract. When the stone moves, the ureter goes into spasm around the stone. The stone is usually passed in the urine.  CAUSES   A disorder that makes certain neck glands produce too much parathyroid hormone (primary hyperparathyroidism).  A buildup of uric acid crystals, similar to gout in your joints.  Narrowing (stricture) of the ureter.  A kidney obstruction present at birth (congenital obstruction).  Previous  surgery on the kidney or ureters.  Numerous kidney infections. SYMPTOMS   Feeling sick to your stomach (nauseous).  Throwing up (vomiting).  Blood in the urine (hematuria).  Pain that usually spreads (radiates) to the groin.  Frequency or urgency of urination. DIAGNOSIS   Taking a history and physical exam.  Blood or urine tests.  CT scan.  Occasionally, an examination of the inside of the urinary bladder (cystoscopy) is performed. TREATMENT   Observation.  Increasing your fluid intake.  Extracorporeal shock wave lithotripsy This is a noninvasive procedure that uses shock waves to break up kidney stones.  Surgery may be needed if you have severe pain or persistent obstruction. There are various surgical procedures. Most of the procedures are performed with the use of small instruments. Only small incisions are needed to accommodate these instruments, so recovery time is minimized. The size, location, and chemical composition are all important variables that will determine the proper choice of action for you. Talk to your health care provider to better understand your situation so that you will minimize the risk of injury to yourself and your kidney.  HOME CARE INSTRUCTIONS   Drink enough water and fluids to keep your urine clear or pale yellow. This will help you to pass the stone or stone fragments.  Strain all urine through the provided strainer. Keep all particulate matter and stones for your health care provider to see. The stone causing the pain may be as small as a grain of salt. It is very important to use the strainer each and every  time you pass your urine. The collection of your stone will allow your health care provider to analyze it and verify that a stone has actually passed. The stone analysis will often identify what you can do to reduce the incidence of recurrences.  Only take over-the-counter or prescription medicines for pain, discomfort, or fever as directed  by your health care provider.  Make a follow-up appointment with your health care provider as directed.  Get follow-up X-rays if required. The absence of pain does not always mean that the stone has passed. It may have only stopped moving. If the urine remains completely obstructed, it can cause loss of kidney function or even complete destruction of the kidney. It is your responsibility to make sure X-rays and follow-ups are completed. Ultrasounds of the kidney can show blockages and the status of the kidney. Ultrasounds are not associated with any radiation and can be performed easily in a matter of minutes. SEEK MEDICAL CARE IF:  You experience pain that is progressive and unresponsive to any pain medicine you have been prescribed. SEEK IMMEDIATE MEDICAL CARE IF:   Pain cannot be controlled with the prescribed medicine.  You have a fever or shaking chills.  The severity or intensity of pain increases over 18 hours and is not relieved by pain medicine.  You develop a new onset of abdominal pain.  You feel faint or pass out.  You are unable to urinate. MAKE SURE YOU:   Understand these instructions.  Will watch your condition.  Will get help right away if you are not doing well or get worse. Document Released: 12/06/2005 Document Revised: 08/08/2013 Document Reviewed: 05/09/2013 [facility] Patient Information 2014 [place].

## 2014-05-06 NOTE — ED Provider Notes (Addendum)
Pt signed out by Dr Cheri Guppy to check ct when back.  Ct w small distal left ureteral stone.  pts pain has resolved. abd is soft and non tender. Discussed ct w pt, incl small ureteral stone and kidney cysts - pt states has f/u for hematuria already set up with urologist, Dr Junious Silk - pt will f/u w urology.  Also discussed infiltrate noted on cxr.  Pt/spouse deny any cough or resp symptoms. No fever or chills. No chest pain or discomfort. No sob or unusual doe. Non smoker.  Chest cta bil, pulse ox 99% room air, rr 14. Discussed pcp follow up/f/u cxr, return if symptoms develop.       Mirna Mires, MD 05/06/14 [phone]

## 2014-05-06 NOTE — ED Notes (Signed)
MD at bedside. 

## 2014-05-06 NOTE — ED Notes (Signed)
Pt. reports LLQ pain with nausea onset yesterday afternoon , denies emesis or diarrhea. No fever or chills.

## 2014-05-06 NOTE — ED Provider Notes (Addendum)
CSN: [id number]     Arrival date & time 05/06/14  0145 History   First MD Initiated Contact with Patient 05/06/14 [phone]     Chief Complaint  Patient presents with  . Abdominal Pain     (Consider location/radiation/quality/duration/timing/severity/associated sxs/prior Treatment) HPI Patient is a very pleasant elderly man with a history of CAD (s/p MI, s/p CABG), HLD and history of diverticulosis. He presents with complaints of diffuse abdominal pain which seems to eminate from the LLQ. Pain began abruptly around 1700 while the patient was walking to his car along with his wife following a concert. He had associated diaphoresis, nausea and dry heaves. He feels like his abdomen is bloated. He denies diarrhea.   However, he felt an urge to defecate around the time the pain started. He passed very small amount of stool but reports that he had a normal caliber BM earlier in the day yesterday.   At first, pain was 10/10, aching. Pain lingers but, has improved to 7/10. Patient denies any history of similar sx. No history of abdominal surgeries. The patient notes that he has consulted a Urologist for urinary hesitancy and had a normal bilateral kidney ultrasound last week and is scheduled for a CT abd/pelvis for this week to further evaluate.   Past Medical History  Diagnosis Date  . History of colonic polyps   . ED (erectile dysfunction)   . Diverticulosis of colon   . CAD (coronary artery disease)     S/P CABG in 1999   . Hyperlipidemia     Low HDL  . Decreased exercise tolerance     Exertional fatigue  . Lumbar disc disease     Hx of with recent back pain and buttock pain  . S/P removal of thyroid nodule   . History of MI (myocardial infarction)   . Right knee meniscal tear   . Degenerative arthritis of hip   . Hx of adenomatous colonic polyps 2000  . Allergic rhinitis, cause unspecified 03/12/2013  . Spinal stenosis of lumbar region 03/15/2014   Past Surgical History  Procedure  Laterality Date  . Coronary artery bypass graft  1999  . Cataract extraction    . Ptca    . Tonsillectomy    . Vasectomy    . Nodules on thyroid    . Lumbar disc surgery     Family History  Problem Relation Age of Onset  . Prostate cancer Other   . Prostate cancer Other   . Heart disease Mother   . Heart disease Brother    History  Substance Use Topics  . Smoking status: Never Smoker   . Smokeless tobacco: Never Used  . Alcohol Use: No    Review of Systems Ten point review of symptoms performed and is negative with the exception of symptoms noted above.     Allergies  Beta adrenergic blockers and Simvastatin  Home Medications   Prior to Admission medications   Medication Sig Start Date End Date Taking? Authorizing Provider  acetaminophen (TYLENOL) 650 MG CR tablet Take 650 mg by mouth every 8 (eight) hours as needed for pain.    Yes Historical Provider, MD  aspirin 81 MG EC tablet Take 81 mg by mouth daily.     Yes Historical Provider, MD  calcium-vitamin D (OSCAL WITH D) 500-200 MG-UNIT per tablet Take 1 tablet by mouth daily.     Yes Historical Provider, MD  clopidogrel (PLAVIX) 75 MG tablet Take 1 tablet (75 mg total) by  mouth daily. 04/11/14  Yes Biagio Borg, MD  fish oil-omega-3 fatty acids 1000 MG capsule Take 2 g by mouth daily.     Yes Historical Provider, MD  folic acid (FOLVITE) 403 MCG tablet Take 400 mcg by mouth daily.     Yes Historical Provider, MD  Multiple Vitamins-Minerals (CENTRUM SILVER ULTRA MENS PO) Take 1 tablet by mouth daily.     Yes Historical Provider, MD  niacin (NIASPAN) 1000 MG CR tablet Take 1 tablet (1,000 mg total) by mouth at bedtime. 03/12/13  Yes Biagio Borg, MD  rosuvastatin (CRESTOR) 10 MG tablet Alternate one tablet with a half tablet daily 04/05/14  Yes Burnell Blanks, MD  sildenafil (VIAGRA) 100 MG tablet Take 100 mg by mouth daily as needed for erectile dysfunction.   Yes Historical Provider, MD   BP 160/99  Pulse 74   Temp(Src) 97.8 F (36.6 C) (Oral)  Resp 18  Ht 5\' 8"  (1.727 m)  Wt 175 lb (79.379 kg)  BMI 26.61 kg/m2  SpO2 96% Physical Exam Gen: well developed and well nourished appearing Head: NCAT Eyes: PERL, EOMI Nose: no epistaixis or rhinorrhea Mouth/throat: mucosa is moist and pink Neck: supple, no stridor Lungs: CTA B, no wheezing, rhonchi or rales CV: RRR, no murmur, extremities appear well perfused.  Abd: soft, ttp over the LLQ, no rebound, no gaurding, nondistended Back: no ttp, no cva ttp Skin: warm and dry Ext: normal to inspection, no dependent edema Neuro: CN ii-xii grossly intact, no focal deficits Psyche; mildly anxiousaffect,  calm and cooperative.  ED Course  Procedures (including critical care time) Labs Review  Results for orders placed during the hospital encounter of 05/06/14 (from the past 24 hour(s))  CBC WITH DIFFERENTIAL     Status: None   Collection Time    05/06/14  1:58 AM      Result Value Ref Range   WBC 8.8  4.0 - 10.5 K/uL   RBC 4.97  4.22 - 5.81 MIL/uL   Hemoglobin 16.0  13.0 - 17.0 g/dL   HCT 46.1  39.0 - 52.0 %   MCV 92.8  78.0 - 100.0 fL   MCH 32.2  26.0 - 34.0 pg   MCHC 34.7  30.0 - 36.0 g/dL   RDW 13.0  11.5 - 15.5 %   Platelets 178  150 - 400 K/uL   Neutrophils Relative % 60  43 - 77 %   Neutro Abs 5.3  1.7 - 7.7 K/uL   Lymphocytes Relative 30  12 - 46 %   Lymphs Abs 2.6  0.7 - 4.0 K/uL   Monocytes Relative 9  3 - 12 %   Monocytes Absolute 0.8  0.1 - 1.0 K/uL   Eosinophils Relative 1  0 - 5 %   Eosinophils Absolute 0.1  0.0 - 0.7 K/uL   Basophils Relative 0  0 - 1 %   Basophils Absolute 0.0  0.0 - 0.1 K/uL  COMPREHENSIVE METABOLIC PANEL     Status: Abnormal   Collection Time    05/06/14  1:58 AM      Result Value Ref Range   Sodium 142  137 - 147 mEq/L   Potassium 4.3  3.7 - 5.3 mEq/L   Chloride 104  96 - 112 mEq/L   CO2 24  19 - 32 mEq/L   Glucose, Bld 123 (*) 70 - 99 mg/dL   BUN 26 (*) 6 - 23 mg/dL   Creatinine, Ser 1.48 (*)  0.50 -  1.35 mg/dL   Calcium 9.5  8.4 - 10.5 mg/dL   Total Protein 7.5  6.0 - 8.3 g/dL   Albumin 4.1  3.5 - 5.2 g/dL   AST 32  0 - 37 U/L   ALT 29  0 - 53 U/L   Alkaline Phosphatase 39  39 - 117 U/L   Total Bilirubin 0.7  0.3 - 1.2 mg/dL   GFR calc non Af Amer 43 (*) >90 mL/min   GFR calc Af Amer 50 (*) >90 mL/min  URINALYSIS, ROUTINE W REFLEX MICROSCOPIC     Status: None   Collection Time    05/06/14  2:05 AM      Result Value Ref Range   Color, Urine YELLOW  YELLOW   APPearance CLEAR  CLEAR   Specific Gravity, Urine 1.020  1.005 - 1.030   pH 6.0  5.0 - 8.0   Glucose, UA NEGATIVE  NEGATIVE mg/dL   Hgb urine dipstick NEGATIVE  NEGATIVE   Bilirubin Urine NEGATIVE  NEGATIVE   Ketones, ur NEGATIVE  NEGATIVE mg/dL   Protein, ur NEGATIVE  NEGATIVE mg/dL   Urobilinogen, UA 0.2  0.0 - 1.0 mg/dL   Nitrite NEGATIVE  NEGATIVE   Leukocytes, UA NEGATIVE  NEGATIVE   EKG: nsr, no acute ischemic changes, normal intervals, normal axis, normal qrs complex  MDM   DDX: diverticulitis, UTI, renal colic, SBO, colitis, mesenteric ischemia.   We are treating supportively. Troponin, lactic acid level, CT abd/pelvis are pending.     Elyn Peers, MD 05/06/14 4401  Elyn Peers, MD 05/06/14 [phone]

## 2014-05-22 DIAGNOSIS — N201 Calculus of ureter: Secondary | ICD-10-CM | POA: Diagnosis not present

## 2014-05-22 DIAGNOSIS — Q619 Cystic kidney disease, unspecified: Secondary | ICD-10-CM | POA: Diagnosis not present

## 2014-05-31 ENCOUNTER — Ambulatory Visit (INDEPENDENT_AMBULATORY_CARE_PROVIDER_SITE_OTHER): Payer: Medicare Other | Admitting: Internal Medicine

## 2014-05-31 ENCOUNTER — Encounter: Payer: Self-pay | Admitting: Internal Medicine

## 2014-05-31 ENCOUNTER — Ambulatory Visit (INDEPENDENT_AMBULATORY_CARE_PROVIDER_SITE_OTHER)
Admission: RE | Admit: 2014-05-31 | Discharge: 2014-05-31 | Disposition: A | Discharge status: Home / Self Care | Payer: Medicare Other | Source: Ambulatory Visit | Attending: Internal Medicine | Admitting: Internal Medicine

## 2014-05-31 VITALS — BP 112/82 | HR 72 | Temp 98.0°F | Wt 178.0 lb

## 2014-05-31 DIAGNOSIS — I2510 Atherosclerotic heart disease of native coronary artery without angina pectoris: Secondary | ICD-10-CM | POA: Diagnosis not present

## 2014-05-31 DIAGNOSIS — R9389 Abnormal findings on diagnostic imaging of other specified body structures: Secondary | ICD-10-CM

## 2014-05-31 DIAGNOSIS — R918 Other nonspecific abnormal finding of lung field: Secondary | ICD-10-CM | POA: Diagnosis not present

## 2014-05-31 DIAGNOSIS — I10 Essential (primary) hypertension: Secondary | ICD-10-CM | POA: Diagnosis not present

## 2014-05-31 DIAGNOSIS — N529 Male erectile dysfunction, unspecified: Secondary | ICD-10-CM

## 2014-05-31 NOTE — Patient Instructions (Addendum)
Please continue all other medications as before, and refills have been done if requested.  Please have the pharmacy call with any other refills you may need.  Please continue your efforts at being more active, low cholesterol diet, and weight control.  You are otherwise up to date with prevention measures today.  Please keep your appointments with your specialists as you may have planned  Please go to the XRAY Department in the Basement (go straight as you get off the elevator) for the x-ray testing  You will be contacted by phone if any changes need to be made immediately.  Otherwise, you will receive a letter about your results with an explanation, but please check with MyChart first.  Please remember to sign up for MyChart if you have not done so, as this will be important to you in the future with finding out test results, communicating by private email, and scheduling acute appointments online when needed. 

## 2014-05-31 NOTE — Progress Notes (Signed)
Pre visit review using our clinic review tool, if applicable. No additional management support is needed unless otherwise documented below in the visit note. 

## 2014-06-03 ENCOUNTER — Encounter: Payer: Self-pay | Admitting: Internal Medicine

## 2014-06-03 DIAGNOSIS — N529 Male erectile dysfunction, unspecified: Secondary | ICD-10-CM

## 2014-06-03 DIAGNOSIS — R9389 Abnormal findings on diagnostic imaging of other specified body structures: Secondary | ICD-10-CM | POA: Insufficient documentation

## 2014-06-03 HISTORY — DX: Male erectile dysfunction, unspecified: N52.9

## 2014-06-03 NOTE — Progress Notes (Signed)
Subjective:    Patient ID: Larry [name], male    DOB: July 26, 1932, 80 y.o.   MRN: [id number]  HPI  Here to f/u with copy of recent abd/pelvic CT per urology done for eval for hematuria, with indication of right basilar infiltrate. Asked to come here in f/u.  No fever, cough and Pt denies chest pain, increased sob or doe, wheezing, orthopnea, PND, increased LE swelling, palpitations, dizziness or syncope.   Pt denies polydipsia, polyuria.  Asks for samples cialis today as is too expensive Past Medical History  Diagnosis Date  . History of colonic polyps   . ED (erectile dysfunction)   . Diverticulosis of colon   . CAD (coronary artery disease)     S/P CABG in 1999   . Hyperlipidemia     Low HDL  . Decreased exercise tolerance     Exertional fatigue  . Lumbar disc disease     Hx of with recent back pain and buttock pain  . S/P removal of thyroid nodule   . History of MI (myocardial infarction)   . Right knee meniscal tear   . Degenerative arthritis of hip   . Hx of adenomatous colonic polyps 2000  . Allergic rhinitis, cause unspecified 03/12/2013  . Spinal stenosis of lumbar region 03/15/2014   Past Surgical History  Procedure Laterality Date  . Coronary artery bypass graft  1999  . Cataract extraction    . Ptca    . Tonsillectomy    . Vasectomy    . Nodules on thyroid    . Lumbar disc surgery      reports that he has never smoked. He has never used smokeless tobacco. He reports that he does not drink alcohol or use illicit drugs. family history includes Heart disease in his brother and mother; Prostate cancer in his other and other. Allergies  Allergen Reactions  . Beta Adrenergic Blockers     REACTION: low blood pressures  . Simvastatin     REACTION: myalgias   Current Outpatient Prescriptions on File Prior to Visit  Medication Sig Dispense Refill  . acetaminophen (TYLENOL) 650 MG CR tablet Take 650 mg by mouth every 8 (eight) hours as needed for pain.       Marland Kitchen aspirin  81 MG EC tablet Take 81 mg by mouth daily.        . calcium-vitamin D (OSCAL WITH D) 500-200 MG-UNIT per tablet Take 1 tablet by mouth daily.        . clopidogrel (PLAVIX) 75 MG tablet Take 1 tablet (75 mg total) by mouth daily.  90 tablet  3  . fish oil-omega-3 fatty acids 1000 MG capsule Take 2 g by mouth daily.        . folic acid (FOLVITE) 086 MCG tablet Take 400 mcg by mouth daily.        . Multiple Vitamins-Minerals (CENTRUM SILVER ULTRA MENS PO) Take 1 tablet by mouth daily.        . niacin (NIASPAN) 1000 MG CR tablet Take 1 tablet (1,000 mg total) by mouth at bedtime.  90 tablet  3  . oxyCODONE-acetaminophen (PERCOCET/ROXICET) 5-325 MG per tablet Take 1-2 tablets by mouth every 6 (six) hours as needed for severe pain.  20 tablet  0  . rosuvastatin (CRESTOR) 10 MG tablet Alternate one tablet with a half tablet daily  90 tablet  3  . sildenafil (VIAGRA) 100 MG tablet Take 100 mg by mouth daily as needed for erectile dysfunction.      Marland Kitchen  tamsulosin (FLOMAX) 0.4 MG CAPS capsule Take 1 capsule (0.4 mg total) by mouth daily.  10 capsule  0   No current facility-administered medications on file prior to visit.   Review of Systems  Constitutional: Negative for unusual diaphoresis or other sweats  HENT: Negative for ringing in ear Eyes: Negative for double vision or worsening visual disturbance.  Respiratory: Negative for choking and stridor.   Gastrointestinal: Negative for vomiting or other signifcant bowel change Genitourinary: Negative for hematuria or decreased urine volume.  Musculoskeletal: Negative for other MSK pain or swelling Skin: Negative for color change and worsening wound.  Neurological: Negative for tremors and numbness other than noted  Psychiatric/Behavioral: Negative for decreased concentration or agitation other than above       Objective:   Physical Exam BP 112/82  Pulse 72  Temp(Src) 98 F (36.7 C) (Oral)  Wt 178 lb (80.74 kg)  SpO2 95% VS noted, not ill  appearing Constitutional: Pt appears well-developed, well-nourished.  HENT: Head: NCAT.  Right Ear: External ear normal.  Left Ear: External ear normal.  Eyes: . Pupils are equal, round, and reactive to light. Conjunctivae and EOM are normal Neck: Normal range of motion. Neck supple.  Cardiovascular: Normal rate and regular rhythm.   Pulmonary/Chest: Effort normal and breath sounds normal.  Abd:  Soft, NT, ND, + BS Neurological: Pt is alert. Not confused , motor grossly intact Skin: Skin is warm. No rash Psychiatric: Pt behavior is normal. No agitation.     Assessment & Plan:

## 2014-06-03 NOTE — Assessment & Plan Note (Signed)
stable overall by history and exam, recent data reviewed with pt, and pt to continue medical treatment as before,  to f/u any worsening symptoms or concerns BP Readings from Last 3 Encounters:  05/31/14 112/82  05/06/14 100/79  04/09/14 130/83

## 2014-06-03 NOTE — Assessment & Plan Note (Addendum)
Recent abd/pelvic ct with documentation of right basilar infiltrate reviewed with pt in room, suspect atelectasis, pt asympt, VSS, not hypoxic, exam o/w benign, afeb, wbc not known, OK for f/u cxr today, consider ct chest for any worsening s/s

## 2014-06-03 NOTE — Assessment & Plan Note (Signed)
Gave cialis 20mg  sample #6 tabs ,  to f/u any worsening symptoms or concerns

## 2014-07-09 ENCOUNTER — Other Ambulatory Visit: Payer: Self-pay

## 2014-07-09 ENCOUNTER — Encounter: Payer: Self-pay | Admitting: Internal Medicine

## 2014-07-09 MED ORDER — TAMSULOSIN HCL 0.4 MG PO CAPS: 0.4000 mg | ORAL_CAPSULE | Freq: Every day | ORAL | Status: DC

## 2014-07-09 NOTE — Telephone Encounter (Signed)
Refill done per email request.

## 2014-09-11 ENCOUNTER — Telehealth: Payer: Self-pay | Admitting: *Deleted

## 2014-09-11 ENCOUNTER — Ambulatory Visit (INDEPENDENT_AMBULATORY_CARE_PROVIDER_SITE_OTHER): Payer: Medicare Other | Admitting: Nurse Practitioner

## 2014-09-11 ENCOUNTER — Encounter: Payer: Self-pay | Admitting: Nurse Practitioner

## 2014-09-11 VITALS — BP 120/70 | HR 66 | Ht 67.0 in | Wt 178.4 lb

## 2014-09-11 DIAGNOSIS — I2510 Atherosclerotic heart disease of native coronary artery without angina pectoris: Secondary | ICD-10-CM | POA: Diagnosis not present

## 2014-09-11 DIAGNOSIS — Z86010 Personal history of colonic polyps: Secondary | ICD-10-CM

## 2014-09-11 NOTE — Progress Notes (Signed)
Reviewed and agree.

## 2014-09-11 NOTE — Telephone Encounter (Signed)
09/11/2014   RE: Larry [name] DOB: [birth year] MRN: [id number]   Dear Dr. Angelena Form,    We have scheduled the above patient for an endoscopic procedure. Our records show that he is on anticoagulation therapy.   Please advise as to how long the patient may come off his therapy of Plavix prior to the procedure, which we will be scheduled for December.  Please fax back/ or route the completed form to Evette Georges., CMA    Sincerely,    Hope Pigeon

## 2014-09-11 NOTE — Telephone Encounter (Signed)
Larry [name] can hold his Plavix for 7 days before his planned endoscopic procedure and resume when safe from a GI standpoint.   [name]

## 2014-09-11 NOTE — Patient Instructions (Signed)
It has been recommended to you by your physician that you have a(n)Colonoscopy  completed. Per your request, we did not schedule the procedure(s) today. Please contact our office at [phone] should you decide to have the procedure completed. 

## 2014-09-11 NOTE — Progress Notes (Signed)
HPI :  Larry [name] is a 80 year old male known to Dr. Olevia Perches for history of adenomatous colon polyps. His last surveillance colonoscopy July 2005 with findings of only diverticulosis. The Larry [name] received his colonoscopy recall letter. He has a history of coronary artery disease, status post CABG 1999. Larry [name] has no gastrointestinal complaints. Specifically, he has no bowel changes, blood in stool, abdominal pain or unusual weight loss. He has no chest pains or shortness of breath.  Past Medical History  Diagnosis Date  . History of colonic polyps   . ED (erectile dysfunction)   . Diverticulosis of colon   . CAD (coronary artery disease)     S/P CABG in 1999   . Hyperlipidemia     Low HDL  . Decreased exercise tolerance     Exertional fatigue  . Lumbar disc disease     Hx of with recent back pain and buttock pain  . S/P removal of thyroid nodule   . History of MI (myocardial infarction)   . Right knee meniscal tear   . Degenerative arthritis of hip   . Hx of adenomatous colonic polyps 2000  . Allergic rhinitis, cause unspecified 03/12/2013  . Spinal stenosis of lumbar region 03/15/2014  . Erectile dysfunction 06/03/2014    Family History  Problem Relation Age of Onset  . Prostate cancer Other   . Prostate cancer Other   . Heart disease Mother   . Heart disease Brother    History  Substance Use Topics  . Smoking status: Never Smoker   . Smokeless tobacco: Never Used  . Alcohol Use: No   Current Outpatient Prescriptions  Medication Sig Dispense Refill  . acetaminophen (TYLENOL) 650 MG CR tablet Take 650 mg by mouth every 8 (eight) hours as needed for pain.       Marland Kitchen aspirin 81 MG EC tablet Take 81 mg by mouth daily.        . calcium-vitamin D (OSCAL WITH D) 500-200 MG-UNIT per tablet Take 1 tablet by mouth daily.        . clopidogrel (PLAVIX) 75 MG tablet Take 1 tablet (75 mg total) by mouth daily.  90 tablet  3  . fish oil-omega-3 fatty acids 1000 MG capsule Take 2 g by  mouth daily.        . folic acid (FOLVITE) 993 MCG tablet Take 400 mcg by mouth daily.        . Multiple Vitamins-Minerals (CENTRUM SILVER ULTRA MENS PO) Take 1 tablet by mouth daily.        . niacin (NIASPAN) 1000 MG CR tablet Take 1 tablet (1,000 mg total) by mouth at bedtime.  90 tablet  3  . rosuvastatin (CRESTOR) 10 MG tablet Alternate one tablet with a half tablet daily  90 tablet  3  . sildenafil (VIAGRA) 100 MG tablet Take 100 mg by mouth daily as needed for erectile dysfunction.      . tamsulosin (FLOMAX) 0.4 MG CAPS capsule Take 1 capsule (0.4 mg total) by mouth daily.  30 capsule  0   No current facility-administered medications for this visit.   Allergies  Allergen Reactions  . Beta Adrenergic Blockers     REACTION: low blood pressures  . Simvastatin     REACTION: myalgias     Review of Systems: All systems reviewed and negative except where noted in HPI.   Physical Exam: BP 120/70  Pulse 66  Ht 5\' 7"  (1.702 m)  Wt 178 lb 6.4 oz (  80.922 kg)  BMI 27.93 kg/m2 Constitutional: Pleasant,well-developed, male in no acute distress. HEENT: Normocephalic and atraumatic. Conjunctivae are normal. No scleral icterus. Neck supple.  Cardiovascular: Normal rate, regular rhythm.  Pulmonary/chest: Effort normal and breath sounds normal. No wheezing, rales or rhonchi. Abdominal: Soft, nondistended, nontender. Bowel sounds active throughout. There are no masses palpable. No hepatomegaly. Extremities: no edema Lymphadenopathy: No cervical adenopathy noted. Neurological: Alert and oriented to person place and time. Skin: Skin is warm and dry. No rashes noted. Psychiatric: Normal mood and affect. Behavior is normal.   ASSESSMENT AND PLAN:  63. 80 year old male with history of adenomatous colon polyps, due for surveillance colonoscopy. The risks, benefits, and alternatives to colonoscopy with possible biopsy and possible polypectomy were discussed with the Larry [name] and he consents to  proceed.   2. Coronary artery disease, status post remote CABG. No ischemia on stress test April of this year. Larry [name] is on Plavix. We will contact the prescribing provider about holding Plavix for colonoscopy.

## 2014-09-12 NOTE — Telephone Encounter (Signed)
Burnell Blanks, MD at 09/11/2014 5:27 PM     Status: Signed        Mr. Tallman can hold his Plavix for 7 days before his planned endoscopic procedure and resume when safe from a GI standpoint.  [name]

## 2014-09-19 ENCOUNTER — Ambulatory Visit (INDEPENDENT_AMBULATORY_CARE_PROVIDER_SITE_OTHER): Payer: Medicare Other

## 2014-09-19 DIAGNOSIS — Z23 Encounter for immunization: Secondary | ICD-10-CM | POA: Diagnosis not present

## 2014-09-20 DIAGNOSIS — M65342 Trigger finger, left ring finger: Secondary | ICD-10-CM | POA: Diagnosis not present

## 2014-09-26 ENCOUNTER — Encounter: Payer: Self-pay | Admitting: Internal Medicine

## 2014-10-29 DIAGNOSIS — M7541 Impingement syndrome of right shoulder: Secondary | ICD-10-CM | POA: Diagnosis not present

## 2014-10-29 DIAGNOSIS — M25511 Pain in right shoulder: Secondary | ICD-10-CM | POA: Diagnosis not present

## 2014-11-18 ENCOUNTER — Ambulatory Visit (INDEPENDENT_AMBULATORY_CARE_PROVIDER_SITE_OTHER): Payer: Medicare Other | Admitting: Cardiovascular Disease

## 2014-11-18 ENCOUNTER — Encounter: Payer: Self-pay | Admitting: Cardiovascular Disease

## 2014-11-18 VITALS — BP 104/84 | HR 71 | Ht 67.0 in | Wt 180.0 lb

## 2014-11-18 DIAGNOSIS — I25810 Atherosclerosis of coronary artery bypass graft(s) without angina pectoris: Secondary | ICD-10-CM

## 2014-11-18 DIAGNOSIS — I2510 Atherosclerotic heart disease of native coronary artery without angina pectoris: Secondary | ICD-10-CM

## 2014-11-18 DIAGNOSIS — E785 Hyperlipidemia, unspecified: Secondary | ICD-10-CM

## 2014-11-18 DIAGNOSIS — I493 Ventricular premature depolarization: Secondary | ICD-10-CM | POA: Diagnosis not present

## 2014-11-18 NOTE — Progress Notes (Signed)
History of Present Illness: 80 yo WM with history of CAD s/p CABG, HLD here for cardiac follow up. He has been followed in the past by Dr. Olevia Perches. He had 3V CABG in 1999 at [facility] in [place]. In 2008 he had a catheterization and was found to have a patent LIMA to LAD and a patent vein graft to the right coronary and no significant obstruction in the circumflex artery. He has been doing well since that time. Exercise stress test July 2012 with no ischemia. LVEF 50%. He has had PVCs noted on cardiac monitor and was started on Cardizem but he did not tolerate the Cardizem due to dizziness so it was stopped.   He is here today for follow up. He denies chest pain, SOB.   He is very active.   Primary Care Physician: Cathlean Cower  Last Lipid Profile:   Lipid Panel     Component Value Date/Time   CHOL 130 03/12/2013 1145   TRIG 162* 04/03/2014 0843   TRIG 143.0 03/12/2013 1145   HDL 37.10* 03/12/2013 1145   CHOLHDL 4 03/12/2013 1145   VLDL 28.6 03/12/2013 1145   LDLCALC 50 04/03/2014 0843   LDLCALC 64 03/12/2013 1145     Past Medical History  Diagnosis Date  . History of colonic polyps   . ED (erectile dysfunction)   . Diverticulosis of colon   . CAD (coronary artery disease)     S/P CABG in 1999   . Hyperlipidemia     Low HDL  . Decreased exercise tolerance     Exertional fatigue  . Lumbar disc disease     Hx of with recent back pain and buttock pain  . S/P removal of thyroid nodule   . History of MI (myocardial infarction)   . Right knee meniscal tear   . Degenerative arthritis of hip   . Hx of adenomatous colonic polyps 2000  . Allergic rhinitis, cause unspecified 03/12/2013  . Spinal stenosis of lumbar region 03/15/2014  . Erectile dysfunction 06/03/2014    Past Surgical History  Procedure Laterality Date  . Coronary artery bypass graft  1999  . Cataract extraction    . Ptca    . Tonsillectomy    . Vasectomy    . Nodules on thyroid    . Lumbar disc  surgery      Current Outpatient Prescriptions  Medication Sig Dispense Refill  . acetaminophen (TYLENOL) 650 MG CR tablet Take 650 mg by mouth every 8 (eight) hours as needed for pain.     Marland Kitchen aspirin 81 MG EC tablet Take 81 mg by mouth daily.      . calcium-vitamin D (OSCAL WITH D) 500-200 MG-UNIT per tablet Take 1 tablet by mouth daily.      . clopidogrel (PLAVIX) 75 MG tablet Take 1 tablet (75 mg total) by mouth daily. 90 tablet 3  . fish oil-omega-3 fatty acids 1000 MG capsule Take 2 g by mouth daily.      . folic acid (FOLVITE) 962 MCG tablet Take 400 mcg by mouth daily.      . Multiple Vitamins-Minerals (CENTRUM SILVER ULTRA MENS PO) Take 1 tablet by mouth daily.      . niacin (NIASPAN) 1000 MG CR tablet Take 1 tablet (1,000 mg total) by mouth at bedtime. 90 tablet 3  . rosuvastatin (CRESTOR) 10 MG tablet Alternate one tablet with a half tablet daily 90 tablet 3  . sildenafil (VIAGRA) 100 MG tablet Take  100 mg by mouth daily as needed for erectile dysfunction.    . tamsulosin (FLOMAX) 0.4 MG CAPS capsule Take 1 capsule (0.4 mg total) by mouth daily. 30 capsule 0   No current facility-administered medications for this visit.    Allergies  Allergen Reactions  . Beta Adrenergic Blockers     REACTION: low blood pressures  . Simvastatin     REACTION: myalgias    History   Social History  . Marital Status: Married    Spouse Name: N/A    Number of Children: 2  . Years of Education: N/A   Occupational History  . 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 Smoking status: Never Smoker   . Smokeless tobacco: Never Used  . Alcohol Use: No  . Drug Use: No  . Sexual Activity: Not on file   Other Topics Concern  . Not on file   Social History Narrative   M[facility] from [facility]   Married      [employer] on file    Family History  Problem Relation Age of Onset  . Prostate cancer Other   . Prostate cancer Other   . Heart disease  Mother   . Heart disease Brother     Review of Systems:  As stated in the HPI and otherwise negative.   BP 104/84 mmHg  Pulse 71  Ht 5\' 7"  (1.702 m)  Wt 180 lb (81.647 kg)  BMI 28.19 kg/m2  SpO2 99%  Physical Examination: General: Well developed, well nourished, NAD HEENT: OP clear, mucus membranes moist SKIN: warm, dry. No rashes. Neuro: No focal deficits Musculoskeletal: Muscle strength 5/5 all ext Psychiatric: Mood and affect normal Neck: No JVD, no carotid bruits, no thyromegaly, no lymphadenopathy. Lungs:Clear bilaterally, no wheezes, rhonci, crackles Cardiovascular: Regular rate and rhythm. No murmurs, gallops or rubs. Abdomen:Soft. Bowel sounds present. Non-tender.  Extremities: No lower extremity edema. Pulses are 2 + in the bilateral DP/PT.  Exercise myoview 07/07/11: Stress Procedure: The patient exercised for 6:00. The patient stopped due to fatigue, sob, and denied any chest pain. There were no significant ST-T wave changes and occ pvcs/pacs. Technetium 69m Tetrofosmin was injected at peak exercise and myocardial perfusion imaging was performed after a brief delay.  Stress ECG: No significant change from baseline ECG  QPS  Raw Data Images: Normal; no motion artifact; normal heart/lung ratio.  Stress Images: Moderate basal to mid inferior perfusion defect.  Rest Images: Moderate basal to mid inferior perfusion defect.  Subtraction (SDS): Fixed basal to mid inferior perfusion defect.  Transient Ischemic Dilatation (Normal <1.22): 0.96  Lung/Heart Ratio (Normal <0.45): 0.36  Quantitative Gated Spect Images  QGS EDV: 102 ml  QGS ESV: 52 ml  QGS cine images: Mild global hypokinesis.  QGS EF: 50%  Impression  Exercise Capacity: Fair exercise capacity.  BP Response: Normal blood pressure response.  Clinical Symptoms: Shortness of breath, no chest pain.  ECG Impression: Baseline slight ST depression in anterolateral leads, no significant change with exertion.    Comparison with Prior Nuclear Study: No significant change from previous study  Overall Impression: Low risk stress nuclear study. Moderate fixed basal to mid inferior perfusion defect suggestive of prior MI. No evidence for ischemia.   Echo 10/13: Left ventricle: The cavity size was mildly dilated. Wall thickness was increased in a pattern of mild LVH. Systolic function was normal. The estimated ejection fraction was in the range of 50% to 55%. Wall motion  was normal; there were no regional wall motion abnormalities. - Aortic valve: Trivial regurgitation. - Mitral valve: Mild regurgitation. - Left atrium: The atrium was mildly dilated. - Atrial septum: No defect or patent foramen ovale was Identified.  Exercise stress myoview 04/09/14: Stress Procedure: The patient exercised on the treadmill utilizing the [name] Protocol for 6:00 minutes. The patient stopped due to fatigue and denied any chest pain. Technetium 75m Sestamibi was injected at peak exercise and myocardial perfusion imaging was performed after a brief delay. Stress ECG: No significant change from baseline ECG, very frequent PVCs  QPS Raw Data Images: Normal; no motion artifact; normal heart/lung ratio. Stress Images: A large size severe severity perfusion defect in the entire inferior, basal and mid inferolateral and basal inferior walls.  Rest Images: A large size severe severity perfusion defect in the entire inferior, basal and mid inferolateral and basal inferior walls.  Subtraction (SDS): No ischemia.  Transient Ischemic Dilatation (Normal <1.22): 0.97 Lung/Heart Ratio (Normal <0.45): 0.43  Quantitative Gated Spect Images QGS EDV: n/a QGS ESV: n/a  Impression Exercise Capacity: Lexiscan with no exercise. BP Response: Normal blood pressure response. Clinical Symptoms: No symptoms. ECG Impression: No significant ST segment change suggestive of ischemia.very frequent PVCs.  Comparison with Prior  Nuclear Study: No change when compared to study from 07/07/11.  Overall Impression: Intermediate risk stress nuclear study with a large scar in the RCA territory, no ischemia. .  LV Ejection Fraction: Study not gated. LV Wall Motion: Study not gated  Assessment and Plan:   1. CORONARY ARTERY DISEASE: He has had no recent chest pain. Continue ASA, Plavix, statin. OK to hold Plavix 7 days before planned colonoscopy.   2. PVCs: Asymptomatic. He did not tolerate Cardizem.   3. HLD: Last LDL well controlled. Continue statin. Lipid panel to be checked in primary care per pt next month.

## 2014-11-18 NOTE — Patient Instructions (Signed)
Your physician wants you to follow-up in:  12 months.  You will receive a reminder letter in the mail two months in advance. If you don't receive a letter, please call our office to schedule the follow-up appointment.   

## 2014-11-20 ENCOUNTER — Ambulatory Visit (AMBULATORY_SURGERY_CENTER): Payer: Self-pay | Admitting: *Deleted

## 2014-11-20 VITALS — Ht 67.5 in | Wt 180.0 lb

## 2014-11-20 DIAGNOSIS — Z86010 Personal history of colonic polyps: Secondary | ICD-10-CM

## 2014-11-20 MED ORDER — MOVIPREP 100 G PO SOLR: 1.0000 | Freq: Once | ORAL | Status: DC

## 2014-11-20 NOTE — Progress Notes (Signed)
No egg or soy allergy. ewm No issues with past sedation. Wife states he is hard to wake after sedation. ewm No diet pills. ewm Pt is on plavix with order to hold x 7 days before procedure per cardio chris McAlhany md. See TE. ewm Pt declined emmi. ewm No home 02 use. ewm

## 2014-11-21 ENCOUNTER — Telehealth: Payer: Self-pay | Admitting: Internal Medicine

## 2014-11-21 NOTE — Telephone Encounter (Signed)
Moviprep bowel prep #1 no refills, take as directed was called into [facility] on [address] per pt's request. Pt was notified.

## 2014-11-25 DIAGNOSIS — Q6100 Congenital renal cyst, unspecified: Secondary | ICD-10-CM | POA: Diagnosis not present

## 2014-11-25 DIAGNOSIS — N201 Calculus of ureter: Secondary | ICD-10-CM | POA: Diagnosis not present

## 2014-12-02 DIAGNOSIS — M7541 Impingement syndrome of right shoulder: Secondary | ICD-10-CM | POA: Diagnosis not present

## 2014-12-04 ENCOUNTER — Ambulatory Visit (AMBULATORY_SURGERY_CENTER): Payer: Medicare Other | Admitting: Internal Medicine

## 2014-12-04 ENCOUNTER — Encounter: Payer: Self-pay | Admitting: Internal Medicine

## 2014-12-04 VITALS — BP 121/77 | HR 52 | Temp 96.2°F | Resp 32 | Ht 67.0 in | Wt 180.0 lb

## 2014-12-04 DIAGNOSIS — I252 Old myocardial infarction: Secondary | ICD-10-CM | POA: Diagnosis not present

## 2014-12-04 DIAGNOSIS — Z86010 Personal history of colonic polyps: Secondary | ICD-10-CM | POA: Diagnosis not present

## 2014-12-04 DIAGNOSIS — Z951 Presence of aortocoronary bypass graft: Secondary | ICD-10-CM | POA: Diagnosis not present

## 2014-12-04 DIAGNOSIS — E669 Obesity, unspecified: Secondary | ICD-10-CM | POA: Diagnosis not present

## 2014-12-04 DIAGNOSIS — Z1211 Encounter for screening for malignant neoplasm of colon: Secondary | ICD-10-CM

## 2014-12-04 MED ORDER — SODIUM CHLORIDE 0.9 % IV SOLN: 500.0000 mL | INTRAVENOUS | Status: DC

## 2014-12-04 NOTE — Op Note (Signed)
[facility]  [address]. [place], [id number]   COLONOSCOPY PROCEDURE REPORT  PATIENT: Larry [name], Larry [name]  MR#: [id number] BIRTHDATE: Oct 09, 1932 , 82  yrs. old GENDER: male ENDOSCOPIST: Lafayette Dragon, MD REFERRED ID:POEUM John, M.D. PROCEDURE DATE:  12/04/2014 PROCEDURE:   Colonoscopy, screening First Screening Colonoscopy - Avg.  risk and is 50 yrs.  old or older - No.  Prior Negative Screening - Now for repeat screening. N/A  History of Adenoma - Now for follow-up colonoscopy & has been > or = to 3 yrs.  Yes hx of adenoma.  Has been 3 or more years since last colonoscopy.  Polyps Removed Today? No.  Polyps Removed Today? No.  Recommend repeat exam, <10 yrs? Polyps Removed Today? No.  Recommend repeat exam, <10 yrs? No. ASA CLASS:   Class III INDICATIONS:aadenomatous polyp removed in 2000.  Prior colonoscopy in July 2005 showed diverticulosis.  Plavix has been on hold for 5 days. MEDICATIONS: Monitored anesthesia care and Propofol 200 mg IV  DESCRIPTION OF PROCEDURE:   After the risks benefits and alternatives of the procedure were thoroughly explained, informed consent was obtained.  The digital rectal exam revealed no abnormalities of the rectum.   The LB [id number]  endoscope was introduced through the anus and advanced to the cecum, which was identified by both the appendix and ileocecal valve. No adverse events experienced.   The quality of the prep was good, using MoviPrep  The instrument was then slowly withdrawn as the colon was fully examined.      COLON FINDINGS: There was mild diverticulosis noted throughout the entire examined colon with associated tortuosity.  Retroflexed views revealed no abnormalities. The time to cecum=8 minutes 31 seconds.  Withdrawal time=6 minutes 29 seconds.  The scope was withdrawn and the procedure completed. COMPLICATIONS: There were no immediate complications.  ENDOSCOPIC IMPRESSION: There was mild  diverticulosis noted throughout the entire examined colon  RECOMMENDATIONS: No recall due to age  High-fiber diet Resume Plavix today  eSigned:  Lafayette Dragon, MD 12/04/2014 10:17 AM   cc:   PATIENT NAME:  Shivam, Mestas MR#: [id number]

## 2014-12-04 NOTE — Progress Notes (Signed)
Report to PACU, RN, vss, BBS= Clear.  

## 2014-12-04 NOTE — Patient Instructions (Signed)

## 2014-12-05 ENCOUNTER — Telehealth: Payer: Self-pay | Admitting: *Deleted

## 2014-12-05 NOTE — Telephone Encounter (Signed)
  Follow up Call-  Call back number 12/04/2014  Post procedure Call Back phone  # [phone]  Permission to leave phone message Yes     Patient questions:  Do you have a fever, pain , or abdominal swelling? No. Pain Score  0 *  Have you tolerated food without any problems? Yes.    Have you been able to return to your normal activities? Yes.    Do you have any questions about your discharge instructions: Diet   No. Medications  No. Follow up visit  No.  Do you have questions or concerns about your Care? No.  Actions: * If pain score is 4 or above: No action needed, pain <4.

## 2014-12-11 DIAGNOSIS — M25511 Pain in right shoulder: Secondary | ICD-10-CM | POA: Diagnosis not present

## 2014-12-18 DIAGNOSIS — M75121 Complete rotator cuff tear or rupture of right shoulder, not specified as traumatic: Secondary | ICD-10-CM | POA: Diagnosis not present

## 2014-12-18 DIAGNOSIS — M7541 Impingement syndrome of right shoulder: Secondary | ICD-10-CM | POA: Diagnosis not present

## 2014-12-27 ENCOUNTER — Other Ambulatory Visit: Payer: Self-pay | Admitting: Internal Medicine

## 2014-12-31 ENCOUNTER — Encounter: Payer: Self-pay | Admitting: Internal Medicine

## 2014-12-31 ENCOUNTER — Other Ambulatory Visit: Payer: Self-pay | Admitting: Internal Medicine

## 2014-12-31 MED ORDER — TAMSULOSIN HCL 0.4 MG PO CAPS: 0.4000 mg | ORAL_CAPSULE | Freq: Every day | ORAL | Status: DC

## 2014-12-31 NOTE — Telephone Encounter (Signed)
Duplicate refill already sent...Larry [name]

## 2015-01-08 ENCOUNTER — Encounter: Payer: Self-pay | Admitting: Cardiovascular Disease

## 2015-01-13 ENCOUNTER — Telehealth: Payer: Self-pay | Admitting: Cardiovascular Disease

## 2015-01-13 NOTE — Telephone Encounter (Signed)
Received request from Nurse fax box, documents faxed for surgical clearance. To: [employer] Fax number: [phone] Attention: 1.25.16/km

## 2015-01-23 ENCOUNTER — Encounter (HOSPITAL_COMMUNITY): Payer: Self-pay

## 2015-01-23 ENCOUNTER — Encounter (HOSPITAL_COMMUNITY)
Admission: RE | Admit: 2015-01-23 | Discharge: 2015-01-23 | Disposition: A | Discharge status: Home / Self Care | Payer: Medicare Other | Source: Ambulatory Visit | Attending: Orthopedic Surgery | Admitting: Orthopedic Surgery

## 2015-01-23 ENCOUNTER — Other Ambulatory Visit: Payer: Self-pay | Admitting: Internal Medicine

## 2015-01-23 DIAGNOSIS — M75100 Unspecified rotator cuff tear or rupture of unspecified shoulder, not specified as traumatic: Secondary | ICD-10-CM | POA: Insufficient documentation

## 2015-01-23 DIAGNOSIS — M7541 Impingement syndrome of right shoulder: Secondary | ICD-10-CM | POA: Diagnosis not present

## 2015-01-23 DIAGNOSIS — M1990 Unspecified osteoarthritis, unspecified site: Secondary | ICD-10-CM | POA: Insufficient documentation

## 2015-01-23 DIAGNOSIS — Z01812 Encounter for preprocedural laboratory examination: Secondary | ICD-10-CM | POA: Insufficient documentation

## 2015-01-23 HISTORY — DX: Personal history of urinary calculi: Z87.442

## 2015-01-23 LAB — CBC WITH DIFFERENTIAL/PLATELET
Basophils Absolute: 0 10*3/uL (ref 0.0–0.1)
Basophils Relative: 0 % (ref 0–1)
EOS PCT: 3 % (ref 0–5)
Eosinophils Absolute: 0.2 10*3/uL (ref 0.0–0.7)
HCT: 45.8 % (ref 39.0–52.0)
Hemoglobin: 16 g/dL (ref 13.0–17.0)
LYMPHS PCT: 27 % (ref 12–46)
Lymphs Abs: 1.5 10*3/uL (ref 0.7–4.0)
MCH: 31.5 pg (ref 26.0–34.0)
MCHC: 34.9 g/dL (ref 30.0–36.0)
MCV: 90.2 fL (ref 78.0–100.0)
MONO ABS: 0.7 10*3/uL (ref 0.1–1.0)
Monocytes Relative: 12 % (ref 3–12)
Neutro Abs: 3.2 10*3/uL (ref 1.7–7.7)
Neutrophils Relative %: 58 % (ref 43–77)
Platelets: 181 10*3/uL (ref 150–400)
RBC: 5.08 MIL/uL (ref 4.22–5.81)
RDW: 12.5 % (ref 11.5–15.5)
WBC: 5.6 10*3/uL (ref 4.0–10.5)

## 2015-01-23 LAB — APTT: APTT: 29 s (ref 24–37)

## 2015-01-23 LAB — COMPREHENSIVE METABOLIC PANEL
ALT: 22 U/L (ref 0–53)
AST: 27 U/L (ref 0–37)
Albumin: 4 g/dL (ref 3.5–5.2)
Alkaline Phosphatase: 39 U/L (ref 39–117)
Anion gap: 10 (ref 5–15)
BUN: 17 mg/dL (ref 6–23)
CHLORIDE: 104 mmol/L (ref 96–112)
CO2: 25 mmol/L (ref 19–32)
Calcium: 9.3 mg/dL (ref 8.4–10.5)
Creatinine, Ser: 1.16 mg/dL (ref 0.50–1.35)
GFR calc Af Amer: 67 mL/min — ABNORMAL LOW (ref >90)
GFR calc non Af Amer: 58 mL/min — ABNORMAL LOW (ref >90)
GLUCOSE: 117 mg/dL — AB (ref 70–99)
Potassium: 3.8 mmol/L (ref 3.5–5.1)
Sodium: 139 mmol/L (ref 135–145)
TOTAL PROTEIN: 7.2 g/dL (ref 6.0–8.3)
Total Bilirubin: 0.6 mg/dL (ref 0.3–1.2)

## 2015-01-23 LAB — PROTIME-INR
INR: 1.05 (ref 0.00–1.49)
Prothrombin Time: 13.8 seconds (ref 11.6–15.2)

## 2015-01-23 NOTE — Pre-Procedure Instructions (Signed)
Larry [name]  01/23/2015   Your procedure is scheduled on:   Thursday  01/30/15  Report to [facility] Admitting at 1030 AM.  Call this number if you have problems the morning of surgery: [phone]   Remember:   Do not eat food or drink liquids after midnight.   Take these medicines the morning of surgery with A SIP OF WATER:  TYLENOL  OR ULTRAM IF NEEDED,  FLOMAX (STOP ASPIRIN, PLAVIX, FISH OIL, MULTIVITAMIN  7 DAYS BEFORE SURGERY)   Do not wear jewelry, make-up or nail polish.  Do not wear lotions, powders, or perfumes. You may wear deodorant.  Do not shave 48 hours prior to surgery. Men may shave face and neck.  Do not bring valuables to the hospital.  [facility] is not responsible                  for any belongings or valuables.               Contacts, dentures or bridgework may not be worn into surgery.  Leave suitcase in the car. After surgery it may be brought to your room.  For patients admitted to the hospital, discharge time is determined by your                treatment team.               Patients discharged the day of surgery will not be allowed to drive  home.  Name and phone number of your driver:   Special Instructions: [facility] - Preparing for Surgery  Before surgery, you can play an important role.  Because skin is not sterile, your skin needs to be as free of germs as possible.  You can reduce the number of germs on you skin by washing with CHG (chlorahexidine gluconate) soap before surgery.  CHG is an antiseptic cleaner which kills germs and bonds with the skin to continue killing germs even after washing.  Please DO NOT use if you have an allergy to CHG or antibacterial soaps.  If your skin becomes reddened/irritated stop using the CHG and inform your nurse when you arrive at Short Stay.  Do not shave (including legs and underarms) for at least 48 hours prior to the first CHG shower.  You may shave your face.  Please follow these instructions  carefully:   1.  Shower with CHG Soap the night before surgery and the                                morning of Surgery.  2.  If you choose to wash your hair, wash your hair first as usual with your       normal shampoo.  3.  After you shampoo, rinse your hair and body thoroughly to remove the                      Shampoo.  4.  Use CHG as you would any other liquid soap.  You can apply chg directly       to the skin and wash gently with scrungie or a clean washcloth.  5.  Apply the CHG Soap to your body ONLY FROM THE NECK DOWN.        Do not use on open wounds or open sores.  Avoid contact with your eyes,  ears, mouth and genitals (private parts).  Wash genitals (private parts)       with your normal soap.  6.  Wash thoroughly, paying special attention to the area where your surgery        will be performed.  7.  Thoroughly rinse your body with warm water from the neck down.  8.  DO NOT shower/wash with your normal soap after using and rinsing off       the CHG Soap.  9.  Pat yourself dry with a clean towel.            10.  Wear clean pajamas.            11.  Place clean sheets on your bed the night of your first shower and do not        sleep with pets.  Day of Surgery  Do not apply any lotions/deoderants the morning of surgery.  Please wear clean clothes to the hospital/surgery center.     Please read over the following fact sheets that you were given: Pain Booklet, Coughing and Deep Breathing, MRSA Information and Surgical Site Infection Prevention

## 2015-01-29 MED ORDER — LACTATED RINGERS IV SOLN
INTRAVENOUS | Status: DC
  Administered 2015-01-30 (×2): via INTRAVENOUS

## 2015-01-29 MED ORDER — CEFAZOLIN SODIUM-DEXTROSE 2-3 GM-% IV SOLR
2.0000 g | INTRAVENOUS | Status: AC
  Administered 2015-01-30: 2 g via INTRAVENOUS
  Filled 2015-01-29: qty 50

## 2015-01-29 NOTE — Progress Notes (Signed)
Pt called and informed of time change. Pt reported that office had already called but verbalized understanding to arrive at 0530 in the morning.

## 2015-01-30 ENCOUNTER — Ambulatory Visit (HOSPITAL_COMMUNITY): Payer: Medicare Other | Admitting: Anesthesiology

## 2015-01-30 ENCOUNTER — Encounter (HOSPITAL_COMMUNITY)
Admission: RE | Disposition: A | Discharge status: Home / Self Care | Payer: Self-pay | Source: Ambulatory Visit | Attending: Orthopedic Surgery

## 2015-01-30 ENCOUNTER — Ambulatory Visit (HOSPITAL_COMMUNITY)
Admission: RE | Admit: 2015-01-30 | Discharge: 2015-01-30 | Disposition: A | Discharge status: Home / Self Care | Payer: Medicare Other | Source: Ambulatory Visit | Attending: Orthopedic Surgery | Admitting: Orthopedic Surgery

## 2015-01-30 ENCOUNTER — Encounter (HOSPITAL_COMMUNITY): Payer: Self-pay | Admitting: *Deleted

## 2015-01-30 DIAGNOSIS — I10 Essential (primary) hypertension: Secondary | ICD-10-CM | POA: Insufficient documentation

## 2015-01-30 DIAGNOSIS — I252 Old myocardial infarction: Secondary | ICD-10-CM | POA: Insufficient documentation

## 2015-01-30 DIAGNOSIS — M1610 Unilateral primary osteoarthritis, unspecified hip: Secondary | ICD-10-CM | POA: Diagnosis not present

## 2015-01-30 DIAGNOSIS — M65811 Other synovitis and tenosynovitis, right shoulder: Secondary | ICD-10-CM | POA: Insufficient documentation

## 2015-01-30 DIAGNOSIS — Z79891 Long term (current) use of opiate analgesic: Secondary | ICD-10-CM | POA: Insufficient documentation

## 2015-01-30 DIAGNOSIS — M75121 Complete rotator cuff tear or rupture of right shoulder, not specified as traumatic: Secondary | ICD-10-CM | POA: Diagnosis not present

## 2015-01-30 DIAGNOSIS — Y929 Unspecified place or not applicable: Secondary | ICD-10-CM | POA: Diagnosis not present

## 2015-01-30 DIAGNOSIS — Z7982 Long term (current) use of aspirin: Secondary | ICD-10-CM | POA: Diagnosis not present

## 2015-01-30 DIAGNOSIS — Z951 Presence of aortocoronary bypass graft: Secondary | ICD-10-CM | POA: Diagnosis not present

## 2015-01-30 DIAGNOSIS — E785 Hyperlipidemia, unspecified: Secondary | ICD-10-CM | POA: Diagnosis not present

## 2015-01-30 DIAGNOSIS — Y939 Activity, unspecified: Secondary | ICD-10-CM | POA: Diagnosis not present

## 2015-01-30 DIAGNOSIS — G8918 Other acute postprocedural pain: Secondary | ICD-10-CM | POA: Diagnosis not present

## 2015-01-30 DIAGNOSIS — M19011 Primary osteoarthritis, right shoulder: Secondary | ICD-10-CM | POA: Diagnosis not present

## 2015-01-30 DIAGNOSIS — Z79899 Other long term (current) drug therapy: Secondary | ICD-10-CM | POA: Insufficient documentation

## 2015-01-30 DIAGNOSIS — S43431A Superior glenoid labrum lesion of right shoulder, initial encounter: Secondary | ICD-10-CM | POA: Insufficient documentation

## 2015-01-30 DIAGNOSIS — I2510 Atherosclerotic heart disease of native coronary artery without angina pectoris: Secondary | ICD-10-CM | POA: Insufficient documentation

## 2015-01-30 DIAGNOSIS — M94211 Chondromalacia, right shoulder: Secondary | ICD-10-CM | POA: Insufficient documentation

## 2015-01-30 DIAGNOSIS — M7541 Impingement syndrome of right shoulder: Secondary | ICD-10-CM | POA: Diagnosis not present

## 2015-01-30 DIAGNOSIS — Y999 Unspecified external cause status: Secondary | ICD-10-CM | POA: Diagnosis not present

## 2015-01-30 DIAGNOSIS — X58XXXA Exposure to other specified factors, initial encounter: Secondary | ICD-10-CM | POA: Insufficient documentation

## 2015-01-30 DIAGNOSIS — M75101 Unspecified rotator cuff tear or rupture of right shoulder, not specified as traumatic: Secondary | ICD-10-CM | POA: Insufficient documentation

## 2015-01-30 DIAGNOSIS — M25511 Pain in right shoulder: Secondary | ICD-10-CM | POA: Diagnosis present

## 2015-01-30 HISTORY — PX: SHOULDER ARTHROSCOPY WITH ROTATOR CUFF REPAIR AND SUBACROMIAL DECOMPRESSION: SHX5686

## 2015-01-30 SURGERY — SHOULDER ARTHROSCOPY WITH ROTATOR CUFF REPAIR AND SUBACROMIAL DECOMPRESSION
Anesthesia: General | Laterality: Right

## 2015-01-30 MED ORDER — LIDOCAINE HCL (CARDIAC) 20 MG/ML IV SOLN
INTRAVENOUS | Status: AC
  Filled 2015-01-30: qty 5

## 2015-01-30 MED ORDER — FENTANYL CITRATE 0.05 MG/ML IJ SOLN
INTRAMUSCULAR | Status: AC
  Filled 2015-01-30: qty 5

## 2015-01-30 MED ORDER — PHENYLEPHRINE HCL 10 MG/ML IJ SOLN
INTRAMUSCULAR | Status: DC | PRN
  Administered 2015-01-30: 40 ug via INTRAVENOUS
  Administered 2015-01-30: 80 ug via INTRAVENOUS
  Administered 2015-01-30: 40 ug via INTRAVENOUS
  Administered 2015-01-30 (×2): 80 ug via INTRAVENOUS

## 2015-01-30 MED ORDER — LIDOCAINE HCL (CARDIAC) 20 MG/ML IV SOLN
INTRAVENOUS | Status: DC | PRN
  Administered 2015-01-30: 60 mg via INTRAVENOUS

## 2015-01-30 MED ORDER — OXYCODONE-ACETAMINOPHEN 5-325 MG PO TABS
1.0000 | ORAL_TABLET | ORAL | Status: DC | PRN
Start: 2015-01-30 — End: 2015-03-20

## 2015-01-30 MED ORDER — SUCCINYLCHOLINE CHLORIDE 20 MG/ML IJ SOLN
INTRAMUSCULAR | Status: DC | PRN
  Administered 2015-01-30: 100 mg via INTRAVENOUS

## 2015-01-30 MED ORDER — SUCCINYLCHOLINE CHLORIDE 20 MG/ML IJ SOLN
INTRAMUSCULAR | Status: AC
  Filled 2015-01-30: qty 1

## 2015-01-30 MED ORDER — HYDROMORPHONE HCL 1 MG/ML IJ SOLN
INTRAMUSCULAR | Status: AC
  Filled 2015-01-30: qty 1

## 2015-01-30 MED ORDER — CHLORHEXIDINE GLUCONATE 4 % EX LIQD: 60.0000 mL | Freq: Once | CUTANEOUS | Status: DC

## 2015-01-30 MED ORDER — ROCURONIUM BROMIDE 50 MG/5ML IV SOLN
INTRAVENOUS | Status: AC
  Filled 2015-01-30: qty 1

## 2015-01-30 MED ORDER — OXYCODONE-ACETAMINOPHEN 5-325 MG PO TABS
1.0000 | ORAL_TABLET | ORAL | Status: DC | PRN
  Administered 2015-01-30: 1 via ORAL

## 2015-01-30 MED ORDER — DIAZEPAM 5 MG PO TABS
ORAL_TABLET | ORAL | Status: AC
  Administered 2015-01-30: 5 mg via ORAL
  Filled 2015-01-30: qty 1

## 2015-01-30 MED ORDER — KETOROLAC TROMETHAMINE 30 MG/ML IJ SOLN
30.0000 mg | Freq: Once | INTRAMUSCULAR | Status: AC
  Administered 2015-01-30: 30 mg via INTRAVENOUS

## 2015-01-30 MED ORDER — PROPOFOL 10 MG/ML IV BOLUS
INTRAVENOUS | Status: DC | PRN
  Administered 2015-01-30: 150 mg via INTRAVENOUS

## 2015-01-30 MED ORDER — ONDANSETRON HCL 4 MG/2ML IJ SOLN
INTRAMUSCULAR | Status: AC
Start: 2015-01-30 — End: 2015-01-30
  Administered 2015-01-30: 4 mg via INTRAVENOUS
  Filled 2015-01-30: qty 2

## 2015-01-30 MED ORDER — OXYCODONE-ACETAMINOPHEN 5-325 MG PO TABS
ORAL_TABLET | ORAL | Status: AC
  Administered 2015-01-30: 1 via ORAL
  Filled 2015-01-30: qty 1

## 2015-01-30 MED ORDER — ONDANSETRON HCL 4 MG/2ML IJ SOLN
4.0000 mg | Freq: Once | INTRAMUSCULAR | Status: AC | PRN
  Administered 2015-01-30: 4 mg via INTRAVENOUS

## 2015-01-30 MED ORDER — PROPOFOL 10 MG/ML IV BOLUS
INTRAVENOUS | Status: AC
  Filled 2015-01-30: qty 20

## 2015-01-30 MED ORDER — SODIUM CHLORIDE 0.9 % IR SOLN
Status: DC | PRN
Start: 2015-01-30 — End: 2015-01-30
  Administered 2015-01-30: 3000 mL

## 2015-01-30 MED ORDER — HYDROMORPHONE HCL 1 MG/ML IJ SOLN
0.2500 mg | INTRAMUSCULAR | Status: DC | PRN
  Administered 2015-01-30 (×2): 0.5 mg via INTRAVENOUS

## 2015-01-30 MED ORDER — ONDANSETRON HCL 4 MG PO TABS: 4.0000 mg | ORAL_TABLET | Freq: Three times a day (TID) | ORAL | Status: DC | PRN

## 2015-01-30 MED ORDER — ROCURONIUM BROMIDE 100 MG/10ML IV SOLN
INTRAVENOUS | Status: DC | PRN
  Administered 2015-01-30: 20 mg via INTRAVENOUS

## 2015-01-30 MED ORDER — FENTANYL CITRATE 0.05 MG/ML IJ SOLN
INTRAMUSCULAR | Status: DC | PRN
  Administered 2015-01-30: 100 ug via INTRAVENOUS
  Administered 2015-01-30: 50 ug via INTRAVENOUS

## 2015-01-30 MED ORDER — KETOROLAC TROMETHAMINE 30 MG/ML IJ SOLN
INTRAMUSCULAR | Status: AC
  Filled 2015-01-30: qty 1

## 2015-01-30 MED ORDER — DIAZEPAM 5 MG PO TABS
5.0000 mg | ORAL_TABLET | Freq: Once | ORAL | Status: AC
  Administered 2015-01-30: 5 mg via ORAL

## 2015-01-30 MED ORDER — DIAZEPAM 5 MG PO TABS: 2.5000 mg | ORAL_TABLET | Freq: Four times a day (QID) | ORAL | Status: DC | PRN

## 2015-01-30 MED ORDER — ONDANSETRON HCL 4 MG/2ML IJ SOLN
INTRAMUSCULAR | Status: DC | PRN
  Administered 2015-01-30: 4 mg via INTRAVENOUS

## 2015-01-30 SURGICAL SUPPLY — 66 items
ANCH SUT SWLK 19.1X4.75 VT (Anchor) ×1 IMPLANT
ANCHOR PEEK 4.75X19.1 SWLK C (Anchor) ×1 IMPLANT
BLADE CUTTER GATOR 3.5 (BLADE) ×2 IMPLANT
BLADE GREAT WHITE 4.2 (BLADE) ×2 IMPLANT
BLADE SURG 11 STRL SS (BLADE) ×2 IMPLANT
BOOTCOVER CLEANROOM LRG (PROTECTIVE WEAR) ×4 IMPLANT
BUR 3.5 LG SPHERICAL (BURR) IMPLANT
BUR OVAL 4.0 (BURR) ×2 IMPLANT
BURR 3.5 LG SPHERICAL (BURR)
CANISTER SUCT LVC 12 LTR MEDI- (MISCELLANEOUS) ×2 IMPLANT
CANNULA ACUFLEX KIT 5X76 (CANNULA) ×2 IMPLANT
CANNULA DRILOCK 5.0X75 (CANNULA) IMPLANT
CANNULA TWIST IN 8.25X7CM (CANNULA) IMPLANT
CONNECTOR 5 IN 1 STRAIGHT STRL (MISCELLANEOUS) ×2 IMPLANT
DRAPE INCISE 23X17 IOBAN STRL (DRAPES) ×1
DRAPE INCISE 23X17 STRL (DRAPES) ×1 IMPLANT
DRAPE INCISE IOBAN 23X17 STRL (DRAPES) ×1 IMPLANT
DRAPE INCISE IOBAN 66X45 STRL (DRAPES) ×2 IMPLANT
DRAPE ORTHO SPLIT 77X108 STRL (DRAPES) ×4
DRAPE STERI 35X30 U-POUCH (DRAPES) ×2 IMPLANT
DRAPE SURG 17X11 SM STRL (DRAPES) ×2 IMPLANT
DRAPE SURG ORHT 6 SPLT 77X108 (DRAPES) ×2 IMPLANT
DRAPE U-SHAPE 47X51 STRL (DRAPES) ×2 IMPLANT
DRSG PAD ABDOMINAL 8X10 ST (GAUZE/BANDAGES/DRESSINGS) ×3 IMPLANT
DURAPREP 26ML APPLICATOR (WOUND CARE) ×4 IMPLANT
ELECT REM PT RETURN 9FT ADLT (ELECTROSURGICAL) ×2
ELECTRODE REM PT RTRN 9FT ADLT (ELECTROSURGICAL) ×1 IMPLANT
GAUZE SPONGE 4X4 12PLY STRL (GAUZE/BANDAGES/DRESSINGS) ×2 IMPLANT
GLOVE BIO SURGEON STRL SZ7.5 (GLOVE) ×2 IMPLANT
GLOVE BIO SURGEON STRL SZ8 (GLOVE) ×2 IMPLANT
GLOVE EUDERMIC 7 POWDERFREE (GLOVE) ×2 IMPLANT
GLOVE SS BIOGEL STRL SZ 7.5 (GLOVE) ×1 IMPLANT
GLOVE SUPERSENSE BIOGEL SZ 7.5 (GLOVE) ×1
GOWN STRL REUS W/ TWL LRG LVL3 (GOWN DISPOSABLE) ×1 IMPLANT
GOWN STRL REUS W/ TWL XL LVL3 (GOWN DISPOSABLE) ×4 IMPLANT
GOWN STRL REUS W/TWL LRG LVL3 (GOWN DISPOSABLE) ×2
GOWN STRL REUS W/TWL XL LVL3 (GOWN DISPOSABLE) ×8
KIT BASIN OR (CUSTOM PROCEDURE TRAY) ×2 IMPLANT
KIT ROOM TURNOVER OR (KITS) ×2 IMPLANT
KIT SHOULDER TRACTION (DRAPES) ×2 IMPLANT
MANIFOLD NEPTUNE II (INSTRUMENTS) ×2 IMPLANT
NDL SCORPION MULTI FIRE (NEEDLE) IMPLANT
NDL SPNL 18GX3.5 QUINCKE PK (NEEDLE) ×1 IMPLANT
NDL SUT 6 .5 CRC .975X.05 MAYO (NEEDLE) IMPLANT
NEEDLE MAYO TAPER (NEEDLE)
NEEDLE SCORPION MULTI FIRE (NEEDLE) IMPLANT
NEEDLE SPNL 18GX3.5 QUINCKE PK (NEEDLE) ×2 IMPLANT
NS IRRIG 1000ML POUR BTL (IV SOLUTION) ×2 IMPLANT
PACK SHOULDER (CUSTOM PROCEDURE TRAY) ×2 IMPLANT
PAD ARMBOARD 7.5X6 YLW CONV (MISCELLANEOUS) ×4 IMPLANT
SET ARTHROSCOPY TUBING (MISCELLANEOUS) ×2
SET ARTHROSCOPY TUBING LN (MISCELLANEOUS) ×1 IMPLANT
SLING ARM LRG ADULT FOAM STRAP (SOFTGOODS) ×1 IMPLANT
SLING ARM MED ADULT FOAM STRAP (SOFTGOODS) ×1 IMPLANT
SPONGE LAP 4X18 X RAY DECT (DISPOSABLE) ×2 IMPLANT
STRIP CLOSURE SKIN 1/2X4 (GAUZE/BANDAGES/DRESSINGS) ×2 IMPLANT
SUT MNCRL AB 3-0 PS2 18 (SUTURE) ×2 IMPLANT
SUT PDS AB 0 CT 36 (SUTURE) IMPLANT
SUT RETRIEVER GRASP 30 DEG (SUTURE) IMPLANT
SUT TIGER TAPE 7 IN WHITE (SUTURE) ×1 IMPLANT
SYR 20CC LL (SYRINGE) ×2 IMPLANT
TAPE PAPER 3X10 WHT MICROPORE (GAUZE/BANDAGES/DRESSINGS) ×2 IMPLANT
TOWEL OR 17X24 6PK STRL BLUE (TOWEL DISPOSABLE) ×2 IMPLANT
TOWEL OR 17X26 10 PK STRL BLUE (TOWEL DISPOSABLE) ×2 IMPLANT
WAND SUCTION MAX 4MM 90S (SURGICAL WAND) ×2 IMPLANT
WATER STERILE IRR 1000ML POUR (IV SOLUTION) ×2 IMPLANT

## 2015-01-30 NOTE — Addendum Note (Signed)
Addendum  created 01/30/15 1832 by Moshe Salisbury, CRNA   Modules edited: Anesthesia Events, Narrator   Narrator:  Narrator: Event Log Edited

## 2015-01-30 NOTE — Anesthesia Preprocedure Evaluation (Addendum)
Anesthesia Evaluation  Patient identified by MRN, date of birth, ID band Patient awake    Reviewed: Allergy & Precautions, NPO status , Patient's Chart, lab work & pertinent test results  Airway Mallampati: II  TM Distance: >3 FB Neck ROM: Full    Dental  (+) Teeth Intact, Dental Advisory Given   Pulmonary          Cardiovascular hypertension, Pt. on medications + CAD, + Past MI and + CABG     Neuro/Psych    GI/Hepatic   Endo/Other    Renal/GU      Musculoskeletal  (+) Arthritis -,   Abdominal   Peds  Hematology   Anesthesia Other Findings   Reproductive/Obstetrics                            Anesthesia Physical Anesthesia Plan  ASA: III  Anesthesia Plan: General   Post-op Pain Management:    Induction: Intravenous  Airway Management Planned: Oral ETT  Additional Equipment:   Intra-op Plan:   Post-operative Plan: Extubation in OR  Informed Consent: I have reviewed the patients History and Physical, chart, labs and discussed the procedure including the risks, benefits and alternatives for the proposed anesthesia with the patient or authorized representative who has indicated his/her understanding and acceptance.   Dental advisory given  Plan Discussed with: CRNA, Anesthesiologist and Surgeon  Anesthesia Plan Comments:        Anesthesia Quick Evaluation

## 2015-01-30 NOTE — Op Note (Signed)
01/30/2015  9:26 AM  PATIENT:   Larry [name]  81 y.o. male  PRE-OPERATIVE DIAGNOSIS: RIGHT [name] ROTATOR CUFF TEAR WITH AC JOINT OA AND IMPINGEMENT  POST-OPERATIVE DIAGNOSIS:  SAME with labral tear and glenohumeral chondromalacia  PROCEDURE:  RSA, labral debridement, chondroplasty, SAD, DCR, RCR  SURGEON:  Barre Aydelott, Metta Clines M.D.  ASSISTANTS: Shuford pac   ANESTHESIA:   GET + ISB  EBL: min  SPECIMEN:  none  Drains: none   PATIENT DISPOSITION:  PACU - hemodynamically stable.    PLAN OF CARE: Discharge to home after PACU  Dictation# [id number]

## 2015-01-30 NOTE — Anesthesia Procedure Notes (Addendum)
Anesthesia Regional Block:  Interscalene brachial plexus block  Pre-Anesthetic Checklist: ,, timeout performed, Correct Patient, Correct Site, Correct Laterality, Correct Procedure, Correct Position, site marked, Risks and benefits discussed,  Surgical consent,  Pre-op evaluation,  At surgeon's request and post-op pain management  Laterality: Right  Prep: Maximum Sterile Barrier Precautions used, chloraprep and alcohol swabs       Needles:  Injection technique: Single-shot  Needle Type: Stimulator Needle - 40        Needle insertion depth: 4 cm   Additional Needles:  Procedures: nerve stimulator Interscalene brachial plexus block  Nerve Stimulator or Paresthesia:  Response: 0.5 mA, 0.1 ms, 4 cm  Additional Responses:   Narrative:  Start time: 01/30/2015 7:03 AM End time: 01/30/2015 7:09 AM Injection made incrementally with aspirations every 5 mL.  Performed by: Personally  Anesthesiologist: Kate Sable  Additional Notes: Pt accepts the procedure w/ risks. 20cc 0.5% Marcaine w/ epi w/o difficulty or discomfort. GES   Block was not up to my quality standards. Will not bill for procedure. GES   Procedure Name: Intubation Date/Time: 01/30/2015 7:44 AM Performed by: Melina Copa, DAVID R Pre-anesthesia Checklist: Patient identified, Emergency Drugs available, Suction available, Patient being monitored and Timeout performed Patient Re-evaluated:Patient Re-evaluated prior to inductionOxygen Delivery Method: Circle system utilized Preoxygenation: Pre-oxygenation with 100% oxygen Intubation Type: IV induction Ventilation: Mask ventilation without difficulty Laryngoscope Size: Mac and 4 Grade View: Grade I Tube size: 8.0 mm Number of attempts: 1 Airway Equipment and Method: Stylet Placement Confirmation: ETT inserted through vocal cords under direct vision and positive ETCO2 Secured at: 22 cm Tube secured with: Tape Dental Injury: Teeth and Oropharynx as per pre-operative  assessment

## 2015-01-30 NOTE — Transfer of Care (Signed)
Immediate Anesthesia Transfer of Care Note  Patient: Larry [name]  Procedure(s) Performed: Procedure(s): RIGHT ARTHROSCOPY [name] SUBACROMIAL DECOMPRESSION DISTAL CLAVICAL RESECTION RETATOR CUFF REPAIR (Right)  Patient Location: PACU  Anesthesia Type:GA combined with regional for post-op pain  Level of Consciousness: awake  Airway & Oxygen Therapy: Patient Spontanous Breathing and Patient connected to nasal cannula oxygen  Post-op Assessment: Report given to RN, Post -op Vital signs reviewed and stable and Patient moving all extremities  Post vital signs: Reviewed and stable  Last Vitals:  Filed Vitals:   01/30/15 0559  BP: 137/82  Pulse: 81  Temp: 36.6 C  Resp: 18    Complications: No apparent anesthesia complications

## 2015-01-30 NOTE — Addendum Note (Signed)
Addendum  created 01/30/15 1039 by Moshe Salisbury, CRNA   Modules edited: Anesthesia Medication Administration

## 2015-01-30 NOTE — Discharge Instructions (Signed)
° °Kevin M. Supple, M.D., F.A.A.O.S. °Orthopaedic Surgery °Specializing in Arthroscopic and Reconstructive °Surgery of the Shoulder and Knee °[phone] °[address] - [place], [place] [address] - Fax [phone] ° °POST-OP SHOULDER ARTHROSCOPIC ROTATOR CUFF  REPAIR INSTRUCTIONS ° °1. Call the office at [phone] to schedule your first post-op appointment 7-10 days from the date of your surgery. ° °2. Leave the steri-strips in place over your incisions when performing dressing changes and showering. You may remove your dressings and begin showering 72 hours from surgery. You can expect drainage that is clear to bloody in nature that occasionally will soak through your dressings. If this occurs go ahead and perform a dressing change. The drainage should lessen daily and when there is no drainage from your incisions feel free to go without a dressing. ° °3. Wear your sling/immobilizer at all times except to perform the exercises below or to occasionally let your arm dangle by your side to stretch your elbow. You also need to sleep in your sling immobilizer until instructed otherwise. ° °4. Range of motion to your elbow, wrist, and hand are encouraged 3-5 times daily. Exercise to your hand and fingers helps to reduce swelling you may experience. ° °5. Utilize ice to the shoulder 3-4 times minimum a day and additionally if you are experiencing pain. ° °6. You may one-armed drive when safely off of narcotics and muscle relaxants. You may use your hand that is in the sling to support the steering wheel only. However, should it be your right arm that is in the sling it is not to be used for gear shifting in a manual transmission. ° °7. If you had a block pre-operatively to provide post-op pain relief you may want to go ahead and begin utilizing your pain meds as your arm begins to wake up. Blocks can sometimes last up to 16-18 hours. If you are still pain-free prior to going to bed you may want to  strongly consider taking a pain medication to avoid being awakened in the night with the onset of pain. A muscle relaxant is also provided for you should you experience muscle spasms. It is recommended that if you are experiencing pain that your pain medication alone is not controlling, add the muscle relaxant along with the pain medication which can give additional pain relief. The first one to two days is generally the most severe of your pain and then should gradually decrease. As your pain lessens it is recommended that you decrease your use of the pain medications to an "as needed basis" only and to always comply with the recommended dosages of the pain medications. ° °8. Pain medications can produce constipation along with their use. If you experience this, the use of an over the counter stool softener or laxative daily is recommended.  ° °9. For additional questions or concerns, please do not hesitate to call the office. If after hours there is an answering service to forward your concerns to the physician on call. ° °POST-OP EXERCISES ° °Pendulum Exercises ° °Perform pendulum exercises while standing and bending at the waist. Support your uninvolved arm on a table or chair and allow your operated arm to hang freely. Make sure to do these exercises passively - not using you shoulder muscle. ° °Repeat 20 times. Do 3 sessions per day. ° °What to eat: ° °For your first meals, you should eat lightly; only small meals initially.  If you do not have nausea, you may eat larger meals.    Avoid spicy, greasy and heavy food.    General Anesthesia, Adult, Care After  Refer to this sheet in the next few weeks. These instructions provide you with information on caring for yourself after your procedure. Your health care provider may also give you more specific instructions. Your treatment has been planned according to current medical practices, but problems sometimes occur. Call your health care provider if you have any  problems or questions after your procedure.  WHAT TO EXPECT AFTER THE PROCEDURE  After the procedure, it is typical to experience:  Sleepiness.  Nausea and vomiting. HOME CARE INSTRUCTIONS  For the first 24 hours after general anesthesia:  Have a responsible person with you.  Do not drive a car. If you are alone, do not take public transportation.  Do not drink alcohol.  Do not take medicine that has not been prescribed by your health care provider.  Do not sign important papers or make important decisions.  You may resume a normal diet and activities as directed by your health care provider.  Change bandages (dressings) as directed.  If you have questions or problems that seem related to general anesthesia, call the hospital and ask for the anesthetist or anesthesiologist on call. SEEK MEDICAL CARE IF:  You have nausea and vomiting that continue the day after anesthesia.  You develop a rash. SEEK IMMEDIATE MEDICAL CARE IF:  You have difficulty breathing.  You have chest pain.  You have any allergic problems. Document Released: 03/14/2001 Document Revised: 08/08/2013 Document Reviewed: 06/21/2013  [facility] Patient Information 2014 [place], [place].   Sore Throat    A sore throat is a painful, burning, sore, or scratchy feeling of the throat. There may be pain or tenderness when swallowing or talking. You may have other symptoms with a sore throat. These include coughing, sneezing, fever, or a swollen neck. A sore throat is often the first sign of another sickness. These sicknesses may include a cold, flu, strep throat, or an infection called mono. Most sore throats go away without medical treatment.  HOME CARE  Only take medicine as told by your doctor.  Drink enough fluids to keep your pee (urine) clear or pale yellow.  Rest as needed.  Try using throat sprays, lozenges, or suck on hard candy (if older than 4 years or as told).  Sip warm liquids, such as broth, herbal tea, or warm  water with honey. Try sucking on frozen ice pops or drinking cold liquids.  Rinse the mouth (gargle) with salt water. Mix 1 teaspoon salt with 8 ounces of water.  Do not smoke. Avoid being around others when they are smoking.  Put a humidifier in your bedroom at night to moisten the air. You can also turn on a hot shower and sit in the bathroom for 5-10 minutes. Be sure the bathroom door is closed. GET HELP RIGHT AWAY IF:  You have trouble breathing.  You cannot swallow fluids, soft foods, or your spit (saliva).  You have more puffiness (swelling) in the throat.  Your sore throat does not get better in 7 days.  You feel sick to your stomach (nauseous) and throw up (vomit).  You have a fever or lasting symptoms for more than 2-3 days.  You have a fever and your symptoms suddenly get worse. MAKE SURE YOU:  Understand these instructions.  Will watch your condition.  Will get help right away if you are not doing well or get worse. Document Released: 09/14/2008 Document Revised: 08/30/2012 Document  Reviewed: 08/13/2012  [employer] Patient Information 2015 [place], [place]. This information is not intended to replace advice given to you by your health care provider. Make sure you discuss any questions you have with your health care provider.

## 2015-01-30 NOTE — Anesthesia Postprocedure Evaluation (Signed)
  Anesthesia Post-op Note  Patient: Larry [name]  Procedure(s) Performed: Procedure(s): RIGHT ARTHROSCOPY [name] SUBACROMIAL DECOMPRESSION DISTAL CLAVICAL RESECTION RETATOR CUFF REPAIR (Right)  Patient Location: PACU  Anesthesia Type:GA combined with regional for post-op pain  Level of Consciousness: awake, alert , oriented and patient cooperative  Airway and Oxygen Therapy: Patient Spontanous Breathing  Post-op Pain: mild  Post-op Assessment: Post-op Vital signs reviewed, Patient's Cardiovascular Status Stable, Respiratory Function Stable, Patent Airway, No signs of Nausea or vomiting and Pain level controlled  Post-op Vital Signs: stable  Last Vitals:  Filed Vitals:   01/30/15 0930  BP: 125/72  Pulse: 70  Temp:   Resp: 17    Complications: No apparent anesthesia complications

## 2015-01-30 NOTE — H&P (Signed)
Larry [name]    Chief Complaint: RIGHT [name] ROTATOR CUFF TEAR HPI: The patient is a 81 y.o. male with chronic right [name] pain refractory to conservative rx.  Past Medical History  Diagnosis Date  . History of colonic polyps   . ED (erectile dysfunction)   . Diverticulosis of colon   . CAD (coronary artery disease)     S/P CABG in 1999   . Hyperlipidemia     Low HDL  . Decreased exercise tolerance     Exertional fatigue  . Lumbar disc disease     Hx of with recent back pain and buttock pain  . S/P removal of thyroid nodule   . History of MI (myocardial infarction)   . Right knee meniscal tear   . Degenerative arthritis of hip   . Hx of adenomatous colonic polyps 2000  . Allergic rhinitis, cause unspecified 03/12/2013  . Spinal stenosis of lumbar region 03/15/2014  . Erectile dysfunction 06/03/2014  . Allergy   . Cataract   . Myocardial infarction 1999  . History of kidney stones     Past Surgical History  Procedure Laterality Date  . Coronary artery bypass graft  1999  . Cataract extraction    . Ptca    . Tonsillectomy    . Vasectomy    . Nodules on thyroid    . Lumbar disc surgery    . Colonoscopy    . Polypectomy    . Cardiac catheterization      Family History  Problem Relation Age of Onset  . Prostate cancer Other   . Prostate cancer Other   . Heart disease Mother   . Heart disease Brother   . Colon cancer Neg Hx   . Rectal cancer Neg Hx   . Stomach cancer Neg Hx     Social History:  reports that he has never smoked. He has never used smokeless tobacco. He reports that he drinks alcohol. He reports that he does not use illicit drugs.  Allergies:  Allergies  Allergen Reactions  . Beta Adrenergic Blockers Other (See Comments)    Dropped blood pressure too low  . Simvastatin Other (See Comments)    myalgia    Medications Prior to Admission  Medication Sig Dispense Refill  . acetaminophen (TYLENOL) 650 MG CR tablet Take 650 mg by mouth every  8 (eight) hours as needed for pain.     Marland Kitchen aspirin 81 MG EC tablet Take 81 mg by mouth at bedtime.     . calcium-vitamin D (OSCAL WITH D) 500-200 MG-UNIT per tablet Take 1 tablet by mouth 2 (two) times daily.     . clopidogrel (PLAVIX) 75 MG tablet TAKE 1 TABLET EVERY DAY 90 tablet 3  . fish oil-omega-3 fatty acids 1000 MG capsule Take 1 g by mouth 2 (two) times daily.     . folic acid (FOLVITE) 502 MCG tablet Take 400 mcg by mouth daily.      . Multiple Vitamin (MULTIVITAMIN WITH MINERALS) TABS tablet Take 1 tablet by mouth daily. Centrum Silver    . niacin (NIASPAN) 1000 MG CR tablet Take 1 tablet (1,000 mg total) by mouth at bedtime. 90 tablet 3  . rosuvastatin (CRESTOR) 10 MG tablet Alternate one tablet with a half tablet daily (Patient taking differently: Take 5-10 mg by mouth daily. Take 1 tablet (10 mg) 1st day, then take 1/2 tablet (5 mg) next day, then repeat) 90 tablet 3  . sildenafil (VIAGRA) 100 MG tablet Take  100 mg by mouth daily as needed for erectile dysfunction.    . tamsulosin (FLOMAX) 0.4 MG CAPS capsule Take 1 capsule (0.4 mg total) by mouth daily. 90 capsule 0  . traMADol (ULTRAM) 50 MG tablet Take 50 mg by mouth 2 (two) times daily as needed (pain).       Physical Exam: right [name] with painful and restricted motion as noted at recent office visits  Vitals  Temp:  [97.8 F (36.6 C)] 97.8 F (36.6 C) (02/11 0559) Pulse Rate:  [68-87] 78 (02/11 0733) Resp:  [18] 18 (02/11 0559) BP: (137)/(82) 137/82 mmHg (02/11 0559) SpO2:  [95 %] 95 % (02/11 0559) Weight:  [79.436 kg (175 lb 2 oz)] 79.436 kg (175 lb 2 oz) (02/11 0559)  Assessment/Plan  Impression: RIGHT [name] ROTATOR CUFF TEAR  Plan of Action: Procedure(s): RIGHT ARTHROSCOPY [name] SUBACROMIAL DECOMPRESSION DISTAL CLAVICAL RESECTION RETATOR CUFF REPAIR  Renessa Wellnitz M 01/30/2015, 7:34 AM

## 2015-01-30 NOTE — Addendum Note (Signed)
Addendum  created 01/30/15 1009 by Kate Sable, MD   Modules edited: Anesthesia Blocks and Procedures, Clinical Notes   Clinical Notes:  File: [id number]

## 2015-01-31 ENCOUNTER — Encounter (HOSPITAL_COMMUNITY): Payer: Self-pay | Admitting: Orthopedic Surgery

## 2015-01-31 NOTE — Op Note (Signed)
[name], [name]:  [id number]  MEDICAL RECORD NO.:  [id number]  LOCATION:  [facility]                         FACILITY:  [facility]  PHYSICIAN:  Metta Clines. [name], M.D.  DATE OF BIRTH:  1932/03/20  DATE OF PROCEDURE:  01/30/2015 DATE OF DISCHARGE:  01/30/2015                              OPERATIVE REPORT   PREOPERATIVE DIAGNOSES: 1. Chronic right shoulder impingement syndrome. 2. Right shoulder acromioclavicular joint arthropathy. 3. Right shoulder full-thickness rotator cuff tear.  POSTOPERATIVE DIAGNOSES: 1. Chronic right shoulder impingement syndrome. 2. Right shoulder acromioclavicular joint arthropathy. 3. Right shoulder full-thickness rotator cuff tear. 4. Degenerative labral tear as well as chondromalacia of the     glenohumeral joint and synovitis.  PROCEDURES: 1. Right shoulder examination under anesthesia. 2. Right shoulder glenohumeral joint diagnostic arthroscopy. 3. Limited synovectomy, labral debridement, and chondroplasty of the     humeral head and glenoid. 4. Arthroscopic subacromial decompression and bursectomy. 5. Arthroscopic distal clavicle resection. 6. Arthroscopic rotator cuff repair using a double-row suture bridge     repair construct.  SURGEON:  Metta Clines. Mister Krahenbuhl, M.D.  Terrence DupontOlivia Mackie A. Shuford, PA-C.  ANESTHESIA:  General endotracheal as well as an interscalene block.  ESTIMATED BLOOD LOSS:  Minimal.  DRAINS:  None.  HISTORY:  [name] is an 81 year old male who is being followed for chronic right shoulder pain with impingement syndrome and MRI evidence for full-thickness rotator cuff tear as well as symptomatic AC joint arthrosis.  Due to his ongoing pain and functional limitations, he was brought to the operating room at this time for planned right shoulder arthroscopy as described below.  Preoperatively, I counseled Larry [name] regarding treatment options as well as risks versus benefits thereof.  Possible surgical  complications were all reviewed including potential for bleeding, infection, neurovascular injury, persistent pain, loss of motion, anesthetic complication, recurrence of rotator cuff tear possible need for additional surgery.  He understands and accepts and agrees with our planned procedure.  PROCEDURE IN DETAIL:  After undergoing routine preop evaluation, the patient received prophylactic antibiotics.  An interscalene block was established in the holding area by the Anesthesia Department.  He was placed supine on the operating table, underwent smooth induction of a general endotracheal anesthesia.  Turned to left lateral decubitus position on a beanbag and appropriately padded and protected.  Right shoulder examination under anesthesia revealed some limitations in mobility with approximately 140 degrees for elevation.  Manipulation was performed, achieving 170 degrees of elevation and 90 degrees of rotation both internal and external.  The right arm was then suspended at 70 degrees abduction with 10 pounds traction and the right shoulder girdle region was sterilely prepped and draped in a standard fashion.  Time-out was called.  Posterior portal was established in the glenohumeral joint and anterior portal was established under direct visualization.  The glenohumeral articular surfaces showed diffuse grade 1 chondromalacia with several areas of fibrillations which were debrided with shaver. There was diffuse synovitis and a limited synovectomy was performed. Also, degenerative labral tearing predominantly superiorly which were debrided with a shaver.  Biceps anchor was stable.  Biceps tendon showed some very minimal degeneration, but no significant attenuation and  no evidence for proximal or distal instability.  Rotator cuff showed an obvious defect involving the distal supraspinatus which were debrided to a limited extent from the articular side.  At this point, irrigation  was completed.  The fluid and instruments were removed.  The arm was dropped down to 30 degrees of abduction with the arthroscope in the subacromial space of the posterior portal and a direct lateral portal established in the subacromial space.  Abundant dense bursal tissue multiple adhesions were encountered and these were all divided and excised with a combination of shaver and Stryker wand.  The wand was then used to remove the periosteum from the undersurface of the anterior half of the acromion.  The subacromial decompression was performed with a burr, creating a type 1 morphology.  Portal was then established directly anterior to the distal clavicle and the distal clavicle resection was performed with a burr.  There are very prominent osteophytes around the distal clavicle and care was taken to confirm visualization of entire circumference of the distal clavicle to ensure adequate removal of bone. We then completed subacromial/subdeltoid bursectomy.  The rotator cuff tear was readily identified and the torn tendon was trimmed back to a stable margin with a shaver.  Ultimate defect shrunk a few centimeters in width.  The greater tuberosity was prepared, removing soft tissue and gently abrading the bone to bleeding bed.  Through a stab wound off the lateral margin of the acromion, we placed an Arthrex SwiveLock suture anchor with a fiber tape and the 4 suture limbs were then shuttled through the free margin of the rotator cuff tear in equal distant across with the tear using the scorpion suture passer.  We then utilized a lateral row anchor to comprise of another SwiveLock which allowed [place] to create a double row construct which nicely decompressed the margin of the rotator cuff against the bony bed on tuberosity and the overall construct was much to our satisfaction.  Suture limbs were clipped. Bursectomy was completed.  Hemostasis was obtained.  Fluid and instruments were removed.   Portal was closed with Monocryl and Steri- Strips.  A dry dressing taped at the right shoulder and right arm was placed in a sling.  The patient was awakened, extubated, and taken to the recovery room in stable condition.  Jenetta Loges, PA-C, was used an Environmental consultant throughout this case, essential for help with positioning the patient, position the extremity, management of the arthroscopic equipment, tissue manipulations, suture management, wound closure, and intraoperative decision making.     Metta Clines. Melora Menon, M.D.    KMS/MEDQ  D:  01/30/2015  T:  01/31/2015  Job:  [id number]

## 2015-02-07 DIAGNOSIS — Z4789 Encounter for other orthopedic aftercare: Secondary | ICD-10-CM | POA: Diagnosis not present

## 2015-02-07 DIAGNOSIS — M75121 Complete rotator cuff tear or rupture of right shoulder, not specified as traumatic: Secondary | ICD-10-CM | POA: Diagnosis not present

## 2015-02-07 DIAGNOSIS — M75101 Unspecified rotator cuff tear or rupture of right shoulder, not specified as traumatic: Secondary | ICD-10-CM | POA: Diagnosis not present

## 2015-02-11 DIAGNOSIS — M75101 Unspecified rotator cuff tear or rupture of right shoulder, not specified as traumatic: Secondary | ICD-10-CM | POA: Diagnosis not present

## 2015-02-13 DIAGNOSIS — M75101 Unspecified rotator cuff tear or rupture of right shoulder, not specified as traumatic: Secondary | ICD-10-CM | POA: Diagnosis not present

## 2015-02-18 DIAGNOSIS — M75101 Unspecified rotator cuff tear or rupture of right shoulder, not specified as traumatic: Secondary | ICD-10-CM | POA: Diagnosis not present

## 2015-02-20 DIAGNOSIS — M75101 Unspecified rotator cuff tear or rupture of right shoulder, not specified as traumatic: Secondary | ICD-10-CM | POA: Diagnosis not present

## 2015-02-25 ENCOUNTER — Encounter (HOSPITAL_COMMUNITY): Payer: Self-pay | Admitting: Orthopedic Surgery

## 2015-02-25 DIAGNOSIS — M75101 Unspecified rotator cuff tear or rupture of right shoulder, not specified as traumatic: Secondary | ICD-10-CM | POA: Diagnosis not present

## 2015-02-25 NOTE — OR Nursing (Signed)
Late entry, delay code documentation, start time placed in 1st surgery start.

## 2015-02-27 DIAGNOSIS — M75101 Unspecified rotator cuff tear or rupture of right shoulder, not specified as traumatic: Secondary | ICD-10-CM | POA: Diagnosis not present

## 2015-03-04 DIAGNOSIS — M75101 Unspecified rotator cuff tear or rupture of right shoulder, not specified as traumatic: Secondary | ICD-10-CM | POA: Diagnosis not present

## 2015-03-05 ENCOUNTER — Other Ambulatory Visit: Payer: Self-pay | Admitting: Internal Medicine

## 2015-03-06 ENCOUNTER — Encounter (HOSPITAL_COMMUNITY): Payer: Self-pay | Admitting: Orthopedic Surgery

## 2015-03-06 DIAGNOSIS — M75101 Unspecified rotator cuff tear or rupture of right shoulder, not specified as traumatic: Secondary | ICD-10-CM | POA: Diagnosis not present

## 2015-03-10 ENCOUNTER — Ambulatory Visit (INDEPENDENT_AMBULATORY_CARE_PROVIDER_SITE_OTHER): Payer: Medicare Other | Admitting: Nurse Practitioner

## 2015-03-10 ENCOUNTER — Encounter: Payer: Self-pay | Admitting: Nurse Practitioner

## 2015-03-10 VITALS — BP 110/72 | HR 104 | Temp 99.0°F | Ht 67.0 in | Wt 179.0 lb

## 2015-03-10 DIAGNOSIS — H66011 Acute suppurative otitis media with spontaneous rupture of ear drum, right ear: Secondary | ICD-10-CM

## 2015-03-10 MED ORDER — OFLOXACIN 0.3 % OT SOLN: 10.0000 [drp] | Freq: Two times a day (BID) | OTIC | Status: DC

## 2015-03-10 NOTE — Progress Notes (Signed)
   Subjective:    Patient ID: Larry [name], male    DOB: June 20, 1932, 81 y.o.   MRN: [id number]  Otalgia  There is pain in the right (pt c/o "awareness" of crackling sound in ear whenpresses pinna. Mild soreness of pinna when presses.) ear. This is a new problem. The current episode started yesterday. The problem occurs constantly. The problem has been unchanged. There has been no fever. Pertinent negatives include no coughing, headaches, hearing loss, rhinorrhea or sore throat. He has tried nothing for the symptoms. recent URI. symptoms resolved.      Review of Systems  HENT: Positive for ear pain. Negative for hearing loss, rhinorrhea and sore throat.   Respiratory: Negative for cough.   Neurological: Negative for headaches.       Objective:   Physical Exam  Constitutional: He is oriented to person, place, and time. He appears well-developed and well-nourished. No distress.  HENT:  Head: Normocephalic and atraumatic.  Right Ear: External ear normal.  Left Ear: External ear normal. Decreased hearing is noted.  Ears:  Mouth/Throat: Oropharynx is clear and moist. No oropharyngeal exudate.  purluent drainage R TM, perforation of TM  Eyes: Conjunctivae are normal. Right eye exhibits no discharge. Left eye exhibits no discharge.  Neck: Normal range of motion. No thyromegaly present.  Cardiovascular: Normal rate.   Pulmonary/Chest: Effort normal.  Lymphadenopathy:    He has no cervical adenopathy.  Neurological: He is alert and oriented to person, place, and time.  Skin: Skin is warm and dry.  Psychiatric: He has a normal mood and affect. His behavior is normal. Thought content normal.  Vitals reviewed.         Assessment & Plan:  1. Acute suppurative otitis media of right ear with spontaneous rupture of tympanic membrane, recurrence not specified See pt instructions - ofloxacin (FLOXIN) 0.3 % otic solution; Place 10 drops into the right ear 2 (two) times daily.  Dispense: 10  mL; Refill: 0 F/u 2 weeks

## 2015-03-10 NOTE — Patient Instructions (Signed)
Please start using ear drops. Hold in hand or warm in pocket for 5 minutes before using. Keep head tilted to side for 5 minutes. Put cottonball in ear before tilting up to prevent drops from running onto shoulder. Remove cottonball after 1 -2 minutes. Keep ear dry: place cottonball in ear before getting into shower. Remove when out of shower. No swimming until rechecked.  Tympanic Membrane Perforation The eardrum (tympanic membrane) protects the inner ear from the outside environment. In addition to protection, the eardrum allows you to hear by transmitting sound waves to the bones in your ear and then to the nervous system. The tympanic membrane is easily perforated, which may result in damage to the inner ear. SYMPTOMS   Sometimes there are no symptoms.  Decreased hearing.  Fluid drainage from ear.  Ear pain. CAUSES   Most commonly, a middle ear infection from built-up pressure.  Injury from a cotton swab.  Traumatic injury to the side of the head. RISK INCREASES WITH:  Frequent middle ear infections.  Use of cotton swabs. PREVENTION   Do not use cotton swabs to clean the ear canal.  If you have ear pain or pressure, see your caregiver to rule out an ear infection that needs treatment. TREATMENT  Protecting the inner ear and allowing the membrane to heal on its own is how tympanic membrane rupture is usually treated. Healing may take several weeks. In order to protect the inner ear, do not allow any fluid to enter the ear canal. Avoid being submerged in water. The use of ear drops may prevent an ear infection from developing, but they should be used with caution, as ear drops can also cause damage to the inner ear. It is important to follow up with your caregiver to confirm healing of the tympanic membrane. If the membrane does not heal, permanent hearing loss may occur. To avoid serious complications, tympanic membranes that do not heal on their own are repaired with  surgery. Document Released: 12/06/2005 Document Revised: 02/28/2012 Document Reviewed: 03/20/2009 [facility] Patient Information 2015 [place], [place]. This information is not intended to replace advice given to you by your health care provider. Make sure you discuss any questions you have with your health care provider.

## 2015-03-10 NOTE — Progress Notes (Signed)
Pre visit review using our clinic review tool, if applicable. No additional management support is needed unless otherwise documented below in the visit note. 

## 2015-03-11 DIAGNOSIS — M75101 Unspecified rotator cuff tear or rupture of right shoulder, not specified as traumatic: Secondary | ICD-10-CM | POA: Diagnosis not present

## 2015-03-12 DIAGNOSIS — Z4789 Encounter for other orthopedic aftercare: Secondary | ICD-10-CM | POA: Diagnosis not present

## 2015-03-13 ENCOUNTER — Telehealth: Payer: Self-pay | Admitting: Internal Medicine

## 2015-03-13 DIAGNOSIS — M75101 Unspecified rotator cuff tear or rupture of right shoulder, not specified as traumatic: Secondary | ICD-10-CM | POA: Diagnosis not present

## 2015-03-13 NOTE — Telephone Encounter (Signed)
Patient states he came in this past Monday and seen Nicky Pugh for an ear ache.  She prescribed ear drops that he can not find anywhere.  His ear has not hurt in the last couple days.  Does he need something else called in or does he need to wait until his appointment next week?

## 2015-03-18 DIAGNOSIS — M75101 Unspecified rotator cuff tear or rupture of right shoulder, not specified as traumatic: Secondary | ICD-10-CM | POA: Diagnosis not present

## 2015-03-18 NOTE — Telephone Encounter (Signed)
If no pain , fever or ear d/c, ok to hold off for now, and f/u OV next as planned

## 2015-03-19 ENCOUNTER — Encounter: Payer: Medicare Other | Admitting: Internal Medicine

## 2015-03-19 ENCOUNTER — Encounter: Payer: Self-pay | Admitting: Cardiovascular Disease

## 2015-03-20 ENCOUNTER — Ambulatory Visit (INDEPENDENT_AMBULATORY_CARE_PROVIDER_SITE_OTHER): Payer: Medicare Other | Admitting: Internal Medicine

## 2015-03-20 ENCOUNTER — Telehealth: Payer: Self-pay | Admitting: *Deleted

## 2015-03-20 ENCOUNTER — Encounter: Payer: Self-pay | Admitting: Cardiovascular Disease

## 2015-03-20 ENCOUNTER — Other Ambulatory Visit (INDEPENDENT_AMBULATORY_CARE_PROVIDER_SITE_OTHER): Payer: Medicare Other

## 2015-03-20 VITALS — BP 118/78 | HR 67 | Temp 99.0°F | Resp 18 | Ht 67.0 in | Wt 181.0 lb

## 2015-03-20 DIAGNOSIS — I10 Essential (primary) hypertension: Secondary | ICD-10-CM

## 2015-03-20 DIAGNOSIS — R739 Hyperglycemia, unspecified: Secondary | ICD-10-CM

## 2015-03-20 DIAGNOSIS — N320 Bladder-neck obstruction: Secondary | ICD-10-CM

## 2015-03-20 DIAGNOSIS — M75101 Unspecified rotator cuff tear or rupture of right shoulder, not specified as traumatic: Secondary | ICD-10-CM | POA: Diagnosis not present

## 2015-03-20 DIAGNOSIS — E785 Hyperlipidemia, unspecified: Secondary | ICD-10-CM

## 2015-03-20 DIAGNOSIS — J309 Allergic rhinitis, unspecified: Secondary | ICD-10-CM

## 2015-03-20 LAB — CBC WITH DIFFERENTIAL/PLATELET
BASOS PCT: 0.4 % (ref 0.0–3.0)
Basophils Absolute: 0 10*3/uL (ref 0.0–0.1)
EOS ABS: 0.2 10*3/uL (ref 0.0–0.7)
Eosinophils Relative: 2.9 % (ref 0.0–5.0)
HEMATOCRIT: 46.8 % (ref 39.0–52.0)
HEMOGLOBIN: 16.3 g/dL (ref 13.0–17.0)
LYMPHS ABS: 1.7 10*3/uL (ref 0.7–4.0)
LYMPHS PCT: 30.6 % (ref 12.0–46.0)
MCHC: 34.8 g/dL (ref 30.0–36.0)
MCV: 91 fl (ref 78.0–100.0)
Monocytes Absolute: 0.6 10*3/uL (ref 0.1–1.0)
Monocytes Relative: 11.4 % (ref 3.0–12.0)
NEUTROS ABS: 3.1 10*3/uL (ref 1.4–7.7)
Neutrophils Relative %: 54.7 % (ref 43.0–77.0)
Platelets: 234 10*3/uL (ref 150.0–400.0)
RBC: 5.15 Mil/uL (ref 4.22–5.81)
RDW: 13.5 % (ref 11.5–15.5)
WBC: 5.6 10*3/uL (ref 4.0–10.5)

## 2015-03-20 LAB — HEPATIC FUNCTION PANEL
ALBUMIN: 4.2 g/dL (ref 3.5–5.2)
ALK PHOS: 43 U/L (ref 39–117)
ALT: 20 U/L (ref 0–53)
AST: 24 U/L (ref 0–37)
BILIRUBIN TOTAL: 0.8 mg/dL (ref 0.2–1.2)
Bilirubin, Direct: 0.2 mg/dL (ref 0.0–0.3)
Total Protein: 7.3 g/dL (ref 6.0–8.3)

## 2015-03-20 LAB — URINALYSIS, ROUTINE W REFLEX MICROSCOPIC
BILIRUBIN URINE: NEGATIVE
HGB URINE DIPSTICK: NEGATIVE
Ketones, ur: NEGATIVE
LEUKOCYTES UA: NEGATIVE
NITRITE: NEGATIVE
Specific Gravity, Urine: 1.025 (ref 1.000–1.030)
Total Protein, Urine: NEGATIVE
Urine Glucose: NEGATIVE
Urobilinogen, UA: 0.2 (ref 0.0–1.0)
pH: 6 (ref 5.0–8.0)

## 2015-03-20 LAB — LIPID PANEL
CHOL/HDL RATIO: 3
CHOLESTEROL: 131 mg/dL (ref 0–200)
HDL: 39.8 mg/dL (ref >39.00)
LDL Cholesterol: 65 mg/dL (ref 0–99)
NonHDL: 91.2
Triglycerides: 130 mg/dL (ref 0.0–149.0)
VLDL: 26 mg/dL (ref 0.0–40.0)

## 2015-03-20 LAB — BASIC METABOLIC PANEL
BUN: 17 mg/dL (ref 6–23)
CO2: 27 mEq/L (ref 19–32)
CREATININE: 1.01 mg/dL (ref 0.40–1.50)
Calcium: 9.6 mg/dL (ref 8.4–10.5)
Chloride: 106 mEq/L (ref 96–112)
GFR: 75.48 mL/min (ref >60.00)
Glucose, Bld: 112 mg/dL — ABNORMAL HIGH (ref 70–99)
Potassium: 4.4 mEq/L (ref 3.5–5.1)
Sodium: 140 mEq/L (ref 135–145)

## 2015-03-20 LAB — PSA: PSA: 1.4 ng/mL (ref 0.10–4.00)

## 2015-03-20 LAB — TSH: TSH: 1.2 u[IU]/mL (ref 0.35–4.50)

## 2015-03-20 LAB — HEMOGLOBIN A1C: HEMOGLOBIN A1C: 5.8 % (ref 4.6–6.5)

## 2015-03-20 NOTE — Progress Notes (Signed)
Pre visit review using our clinic review tool, if applicable. No additional management support is needed unless otherwise documented below in the visit note. 

## 2015-03-20 NOTE — Assessment & Plan Note (Signed)
stable overall by history and exam, recent data reviewed with pt, and pt to continue medical treatment as before,  to f/u any worsening symptoms or concerns Lab Results  Component Value Date   LDLCALC 50 04/03/2014   Has been intol of lipitor/zocor in past, does well with crestor, does not like the cost but will continue this

## 2015-03-20 NOTE — Patient Instructions (Signed)

## 2015-03-20 NOTE — Telephone Encounter (Signed)
This pt is asking for a refill on his Rosuvastatin, which the pharmacy is asking for pravastatin, Anyway im asking if the instructions on the med is the same? Did not find any difference  In the way he is to take it, all i saw was the email stating he would stay on it...please advise

## 2015-03-20 NOTE — Progress Notes (Signed)
Subjective:    Patient ID: Larry [name], male    DOB: July 02, 1932, 81 y.o.   MRN: [id number]  HPI  Here for yearly f/u;  Overall doing ok;  Pt denies Chest pain, worsening SOB, DOE, wheezing, orthopnea, PND, worsening LE edema, palpitations, dizziness or syncope.  Pt denies neurological change such as new headache, facial or extremity weakness.  Pt denies polydipsia, polyuria, or low sugar symptoms. Pt states overall good compliance with treatment and medications, good tolerability, and has been trying to follow appropriate diet.  Pt denies worsening depressive symptoms, suicidal ideation or panic. No fever, night sweats, wt loss, loss of appetite, or other constitutional symptoms.  Pt states good ability with ADL's, has low fall risk, home safety reviewed and adequate, no other significant changes in hearing or vision, and only occasionally active with exercise.  Did have a right ear crackling and sore pinna, saw PA, exam with ruptured TM , never got the tx as rx was not avail at [facility].  Tender outer ear has improved. Does have several wks ongoing nasal allergy symptoms with clearish congestion, itch and sneezing, without fever, pain, ST, cough, swelling or wheezing. Past Medical History  Diagnosis Date  . History of colonic polyps   . ED (erectile dysfunction)   . Diverticulosis of colon   . CAD (coronary artery disease)     S/P CABG in 1999   . Hyperlipidemia     Low HDL  . Decreased exercise tolerance     Exertional fatigue  . Lumbar disc disease     Hx of with recent back pain and buttock pain  . S/P removal of thyroid nodule   . History of MI (myocardial infarction)   . Right knee meniscal tear   . Degenerative arthritis of hip   . Hx of adenomatous colonic polyps 2000  . Allergic rhinitis, cause unspecified 03/12/2013  . Spinal stenosis of lumbar region 03/15/2014  . Erectile dysfunction 06/03/2014  . Allergy   . Cataract   . Myocardial infarction 1999  . History of  kidney stones    Past Surgical History  Procedure Laterality Date  . Coronary artery bypass graft  1999  . Cataract extraction    . Ptca    . Tonsillectomy    . Vasectomy    . Nodules on thyroid    . Lumbar disc surgery    . Colonoscopy    . Polypectomy    . Cardiac catheterization    . Shoulder arthroscopy with rotator cuff repair and subacromial decompression Right 01/30/2015    Procedure: RIGHT ARTHROSCOPY SHOULDER SUBACROMIAL DECOMPRESSION DISTAL CLAVICAL RESECTION RETATOR CUFF REPAIR;  Surgeon: Justice Britain, MD;  Location: [facility];  Service: Orthopedics;  Laterality: Right;    reports that he has never smoked. He has never used smokeless tobacco. He reports that he drinks alcohol. He reports that he does not use illicit drugs. family history includes Heart disease in his brother and mother; Prostate cancer in his other and other. There is no history of Colon cancer, Rectal cancer, or Stomach cancer. Allergies  Allergen Reactions  . Beta Adrenergic Blockers Other (See Comments)    Dropped blood pressure too low  . Simvastatin Other (See Comments)    myalgia   Current Outpatient Prescriptions on File Prior to Visit  Medication Sig Dispense Refill  . acetaminophen (TYLENOL) 650 MG CR tablet Take 650 mg by mouth every 8 (eight) hours as needed for pain.     Marland Kitchen aspirin  81 MG EC tablet Take 81 mg by mouth at bedtime.     . calcium-vitamin D (OSCAL WITH D) 500-200 MG-UNIT per tablet Take 1 tablet by mouth 2 (two) times daily.     . clopidogrel (PLAVIX) 75 MG tablet TAKE 1 TABLET EVERY DAY 90 tablet 3  . fish oil-omega-3 fatty acids 1000 MG capsule Take 1 g by mouth 2 (two) times daily.     . folic acid (FOLVITE) 725 MCG tablet Take 400 mcg by mouth daily.      . Multiple Vitamin (MULTIVITAMIN WITH MINERALS) TABS tablet Take 1 tablet by mouth daily. Centrum Silver    . niacin (NIASPAN) 1000 MG CR tablet Take 1 tablet (1,000 mg total) by mouth at bedtime. 90 tablet 3  . rosuvastatin  (CRESTOR) 10 MG tablet Alternate one tablet with a half tablet daily (Patient taking differently: Take 5-10 mg by mouth daily. Take 1 tablet (10 mg) 1st day, then take 1/2 tablet (5 mg) next day, then repeat) 90 tablet 3  . sildenafil (VIAGRA) 100 MG tablet Take 100 mg by mouth daily as needed for erectile dysfunction.    . tamsulosin (FLOMAX) 0.4 MG CAPS capsule Take 1 capsule (0.4 mg total) by mouth daily. 90 capsule 0   No current facility-administered medications on file prior to visit.    Review of Systems Constitutional: Negative for increased diaphoresis, other activity, appetite or siginficant weight change other than noted HENT: Negative for worsening hearing loss, ear pain, facial swelling, mouth sores and neck stiffness.   Eyes: Negative for other worsening pain, redness or visual disturbance.  Respiratory: Negative for shortness of breath and wheezing  Cardiovascular: Negative for chest pain and palpitations.  Gastrointestinal: Negative for diarrhea, blood in stool, abdominal distention or other pain Genitourinary: Negative for hematuria, flank pain or change in urine volume.  Musculoskeletal: Negative for myalgias or other joint complaints.  Skin: Negative for color change and wound or drainage.  Neurological: Negative for syncope and numbness. other than noted Hematological: Negative for adenopathy. or other swelling Psychiatric/Behavioral: Negative for hallucinations, SI, self-injury, decreased concentration or other worsening agitation.      Objective:   Physical Exam BP 118/78 mmHg  Pulse 67  Temp(Src) 99 F (37.2 C) (Oral)  Resp 18  Ht 5\' 7"  (1.702 m)  Wt 181 lb 0.6 oz (82.119 kg)  BMI 28.35 kg/m2  SpO2 92% VS noted,  Constitutional: Pt is oriented to person, place, and time. Appears well-developed and well-nourished, in no significant distress Head: Normocephalic and atraumatic.  Right Ear: External ear normal.  Left Ear: External ear normal.  Nose: Nose  normal.  Mouth/Throat: Oropharynx is clear and moist.  Bilat tm's with mild erythema.  Max sinus areas non tender.  Pharynx with mild erythema, no exudate Eyes: Conjunctivae and EOM are normal. Pupils are equal, round, and reactive to light.  Neck: Normal range of motion. Neck supple. No JVD present. No tracheal deviation present or significant neck LA or mass Cardiovascular: Normal rate, regular rhythm, normal heart sounds and intact distal pulses.   Pulmonary/Chest: Effort normal and breath sounds without rales or wheezing  Abdominal: Soft. Bowel sounds are normal. NT. No HSM  Musculoskeletal: Normal range of motion. Exhibits no edema.  Lymphadenopathy:  Has no cervical adenopathy.  Neurological: Pt is alert and oriented to person, place, and time. Pt has normal reflexes. No cranial nerve deficit. Motor grossly intact Skin: Skin is warm and dry. No rash noted.  Psychiatric:  Has normal  mood and affect. Behavior is normal.      Assessment & Plan:

## 2015-03-20 NOTE — Assessment & Plan Note (Signed)
.  stable overall by history and exam, recent data reviewed with pt, and pt to continue medical treatment as before,  to f/u any worsening symptoms or concerns BP Readings from Last 3 Encounters:  03/20/15 118/78  03/10/15 110/72  01/30/15 111/77

## 2015-03-20 NOTE — Assessment & Plan Note (Signed)
Mild sympt despite the zyrtec, to add otc nasacort aq prn

## 2015-03-21 ENCOUNTER — Other Ambulatory Visit: Payer: Self-pay

## 2015-03-21 MED ORDER — ROSUVASTATIN CALCIUM 10 MG PO TABS: ORAL_TABLET | ORAL | Status: DC

## 2015-03-21 NOTE — Telephone Encounter (Signed)
Pt saw primary care today and he is going to stay on Crestor

## 2015-03-24 ENCOUNTER — Other Ambulatory Visit: Payer: Self-pay | Admitting: *Deleted

## 2015-03-25 DIAGNOSIS — M75101 Unspecified rotator cuff tear or rupture of right shoulder, not specified as traumatic: Secondary | ICD-10-CM | POA: Diagnosis not present

## 2015-03-27 DIAGNOSIS — M75101 Unspecified rotator cuff tear or rupture of right shoulder, not specified as traumatic: Secondary | ICD-10-CM | POA: Diagnosis not present

## 2015-04-01 DIAGNOSIS — L570 Actinic keratosis: Secondary | ICD-10-CM | POA: Diagnosis not present

## 2015-04-01 DIAGNOSIS — H02823 Cysts of right eye, unspecified eyelid: Secondary | ICD-10-CM | POA: Diagnosis not present

## 2015-04-01 DIAGNOSIS — L820 Inflamed seborrheic keratosis: Secondary | ICD-10-CM | POA: Diagnosis not present

## 2015-04-01 DIAGNOSIS — M75101 Unspecified rotator cuff tear or rupture of right shoulder, not specified as traumatic: Secondary | ICD-10-CM | POA: Diagnosis not present

## 2015-04-01 DIAGNOSIS — L821 Other seborrheic keratosis: Secondary | ICD-10-CM | POA: Diagnosis not present

## 2015-04-01 DIAGNOSIS — L814 Other melanin hyperpigmentation: Secondary | ICD-10-CM | POA: Diagnosis not present

## 2015-04-01 DIAGNOSIS — Z85828 Personal history of other malignant neoplasm of skin: Secondary | ICD-10-CM | POA: Diagnosis not present

## 2015-04-01 DIAGNOSIS — D225 Melanocytic nevi of trunk: Secondary | ICD-10-CM | POA: Diagnosis not present

## 2015-04-01 DIAGNOSIS — D1800 Hemangioma unspecified site: Secondary | ICD-10-CM | POA: Diagnosis not present

## 2015-04-03 DIAGNOSIS — M75101 Unspecified rotator cuff tear or rupture of right shoulder, not specified as traumatic: Secondary | ICD-10-CM | POA: Diagnosis not present

## 2015-04-08 DIAGNOSIS — M75101 Unspecified rotator cuff tear or rupture of right shoulder, not specified as traumatic: Secondary | ICD-10-CM | POA: Diagnosis not present

## 2015-04-09 DIAGNOSIS — Z4789 Encounter for other orthopedic aftercare: Secondary | ICD-10-CM | POA: Diagnosis not present

## 2015-05-06 ENCOUNTER — Other Ambulatory Visit: Payer: Self-pay | Admitting: Cardiovascular Disease

## 2015-05-07 ENCOUNTER — Encounter: Payer: Self-pay | Admitting: Cardiovascular Disease

## 2015-05-07 DIAGNOSIS — Z4789 Encounter for other orthopedic aftercare: Secondary | ICD-10-CM | POA: Diagnosis not present

## 2015-05-13 ENCOUNTER — Other Ambulatory Visit: Payer: Self-pay | Admitting: Internal Medicine

## 2015-08-05 DIAGNOSIS — M5417 Radiculopathy, lumbosacral region: Secondary | ICD-10-CM | POA: Diagnosis not present

## 2015-08-05 DIAGNOSIS — S39012A Strain of muscle, fascia and tendon of lower back, initial encounter: Secondary | ICD-10-CM | POA: Diagnosis not present

## 2015-08-06 DIAGNOSIS — M5410 Radiculopathy, site unspecified: Secondary | ICD-10-CM | POA: Insufficient documentation

## 2015-08-09 DIAGNOSIS — M5410 Radiculopathy, site unspecified: Secondary | ICD-10-CM | POA: Diagnosis not present

## 2015-08-12 DIAGNOSIS — M5410 Radiculopathy, site unspecified: Secondary | ICD-10-CM | POA: Insufficient documentation

## 2015-08-19 DIAGNOSIS — M4806 Spinal stenosis, lumbar region: Secondary | ICD-10-CM | POA: Diagnosis not present

## 2015-08-20 ENCOUNTER — Encounter: Payer: Self-pay | Admitting: Cardiovascular Disease

## 2015-08-21 DIAGNOSIS — M5440 Lumbago with sciatica, unspecified side: Secondary | ICD-10-CM | POA: Diagnosis not present

## 2015-08-29 DIAGNOSIS — M4696 Unspecified inflammatory spondylopathy, lumbar region: Secondary | ICD-10-CM | POA: Diagnosis not present

## 2015-08-29 DIAGNOSIS — M4806 Spinal stenosis, lumbar region: Secondary | ICD-10-CM | POA: Diagnosis not present

## 2015-08-29 DIAGNOSIS — M5410 Radiculopathy, site unspecified: Secondary | ICD-10-CM | POA: Diagnosis not present

## 2015-09-01 DIAGNOSIS — M5440 Lumbago with sciatica, unspecified side: Secondary | ICD-10-CM | POA: Diagnosis not present

## 2015-09-03 DIAGNOSIS — M5440 Lumbago with sciatica, unspecified side: Secondary | ICD-10-CM | POA: Diagnosis not present

## 2015-09-08 DIAGNOSIS — M5440 Lumbago with sciatica, unspecified side: Secondary | ICD-10-CM | POA: Diagnosis not present

## 2015-09-10 DIAGNOSIS — M5440 Lumbago with sciatica, unspecified side: Secondary | ICD-10-CM | POA: Diagnosis not present

## 2015-09-15 DIAGNOSIS — M5440 Lumbago with sciatica, unspecified side: Secondary | ICD-10-CM | POA: Diagnosis not present

## 2015-09-17 DIAGNOSIS — M5440 Lumbago with sciatica, unspecified side: Secondary | ICD-10-CM | POA: Diagnosis not present

## 2015-09-22 DIAGNOSIS — M5440 Lumbago with sciatica, unspecified side: Secondary | ICD-10-CM | POA: Diagnosis not present

## 2015-09-22 DIAGNOSIS — Z23 Encounter for immunization: Secondary | ICD-10-CM | POA: Diagnosis not present

## 2015-09-24 DIAGNOSIS — M5440 Lumbago with sciatica, unspecified side: Secondary | ICD-10-CM | POA: Diagnosis not present

## 2015-10-09 DIAGNOSIS — M5440 Lumbago with sciatica, unspecified side: Secondary | ICD-10-CM | POA: Diagnosis not present

## 2015-10-27 DIAGNOSIS — G5793 Unspecified mononeuropathy of bilateral lower limbs: Secondary | ICD-10-CM | POA: Diagnosis not present

## 2015-11-03 ENCOUNTER — Encounter: Payer: Self-pay | Admitting: Cardiovascular Disease

## 2015-11-03 ENCOUNTER — Telehealth: Payer: Self-pay | Admitting: Cardiovascular Disease

## 2015-11-03 NOTE — Telephone Encounter (Signed)
Fraser Din, See MyChart message on Mr. Koelle. Can we add him on for Nov 28th @ 9:45am for yearly f/u? Thanks, chris

## 2015-11-12 ENCOUNTER — Encounter: Payer: Self-pay | Admitting: Cardiovascular Disease

## 2015-11-17 ENCOUNTER — Encounter: Payer: Self-pay | Admitting: Cardiovascular Disease

## 2015-11-17 ENCOUNTER — Ambulatory Visit (INDEPENDENT_AMBULATORY_CARE_PROVIDER_SITE_OTHER): Payer: Medicare Other | Admitting: Cardiovascular Disease

## 2015-11-17 VITALS — BP 118/80 | HR 89 | Ht 67.0 in | Wt 185.4 lb

## 2015-11-17 DIAGNOSIS — I25810 Atherosclerosis of coronary artery bypass graft(s) without angina pectoris: Secondary | ICD-10-CM

## 2015-11-17 DIAGNOSIS — I493 Ventricular premature depolarization: Secondary | ICD-10-CM | POA: Diagnosis not present

## 2015-11-17 DIAGNOSIS — E785 Hyperlipidemia, unspecified: Secondary | ICD-10-CM

## 2015-11-17 NOTE — Progress Notes (Signed)
Chief Complaint  Patient presents with  . Follow-up    1 year follow up, pt has no cardiac complaints      History of Present Illness: 81 yo WM with history of CAD s/p CABG, HLD here for cardiac follow up. He has been followed in the past by Dr. Olevia Perches. He had 3V CABG in 1999 at [facility] in [place]. In 2008 he had a catheterization and was found to have a patent LIMA to LAD and a patent vein graft to the right coronary and no significant obstruction in the circumflex artery. He has been doing well since that time. Exercise stress test April 2015 with no ischemia. He has had PVCs noted on cardiac monitor and was started on Cardizem but he did not tolerate the Cardizem due to dizziness so it was stopped.   He is here today for follow up. He denies chest pain, SOB.  He is very active.   Primary Care Physician: Cathlean Cower   Past Medical History  Diagnosis Date  . History of colonic polyps   . ED (erectile dysfunction)   . Diverticulosis of colon   . CAD (coronary artery disease)     S/P CABG in 1999   . Hyperlipidemia     Low HDL  . Decreased exercise tolerance     Exertional fatigue  . Lumbar disc disease     Hx of with recent back pain and buttock pain  . S/P removal of thyroid nodule   . History of MI (myocardial infarction)   . Right knee meniscal tear   . Degenerative arthritis of hip   . Hx of adenomatous colonic polyps 2000  . Allergic rhinitis, cause unspecified 03/12/2013  . Spinal stenosis of lumbar region 03/15/2014  . Erectile dysfunction 06/03/2014  . Allergy   . Cataract   . Myocardial infarction ([place]) 1999  . History of kidney stones     Past Surgical History  Procedure Laterality Date  . Coronary artery bypass graft  1999  . Cataract extraction    . Ptca    . Tonsillectomy    . Vasectomy    . Nodules on thyroid    . Lumbar disc surgery    . Colonoscopy    . Polypectomy    . Cardiac catheterization    . Shoulder arthroscopy with rotator  cuff repair and subacromial decompression Right 01/30/2015    Procedure: RIGHT ARTHROSCOPY SHOULDER SUBACROMIAL DECOMPRESSION DISTAL CLAVICAL RESECTION RETATOR CUFF REPAIR;  Surgeon: Justice Britain, MD;  Location: [facility];  Service: Orthopedics;  Laterality: Right;    Current Outpatient Prescriptions  Medication Sig Dispense Refill  . acetaminophen (TYLENOL) 650 MG CR tablet Take 650 mg by mouth every 8 (eight) hours as needed for pain.     Marland Kitchen aspirin 81 MG EC tablet Take 81 mg by mouth at bedtime.     . calcium-vitamin D (OSCAL WITH D) 500-200 MG-UNIT per tablet Take 1 tablet by mouth 2 (two) times daily.     . clopidogrel (PLAVIX) 75 MG tablet TAKE 1 TABLET EVERY DAY 90 tablet 3  . fish oil-omega-3 fatty acids 1000 MG capsule Take 1 g by mouth 2 (two) times daily.     . folic acid (FOLVITE) A999333 MCG tablet Take 400 mcg by mouth daily.      . Multiple Vitamin (MULTIVITAMIN WITH MINERALS) TABS tablet Take 1 tablet by mouth daily. Centrum Silver    . niacin (NIASPAN) 1000 MG CR tablet Take  1 tablet (1,000 mg total) by mouth at bedtime. 90 tablet 3  . rosuvastatin (CRESTOR) 10 MG tablet TAKE 1 TABLET EVERY OTHER DAY ALERNATING WITH 1/2 TABLET  68 tablet 3  . sildenafil (VIAGRA) 100 MG tablet Take 100 mg by mouth daily as needed for erectile dysfunction.    . tamsulosin (FLOMAX) 0.4 MG CAPS capsule Take 1 capsule (0.4 mg total) by mouth daily. 90 capsule 3   No current facility-administered medications for this visit.    Allergies  Allergen Reactions  . Beta Adrenergic Blockers Other (See Comments)    Dropped blood pressure too low  . Simvastatin Other (See Comments)    myalgia    Social History   Social History  . Marital Status: Married    Spouse Name: N/A  . Number of Children: 2  . Years of Education: N/A   Occupational History  . 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 Smoking status: Never Smoker   . Smokeless tobacco: Never Used  . Alcohol Use:  0.0 oz/week    0 Standard drinks or equivalent per week     Comment: rare  . Drug Use: No  . Sexual Activity: Not on file   Other Topics Concern  . Not on file   Social History Narrative   Moved from [facility]   Married      [employer] on file    Family History  Problem Relation Age of Onset  . Prostate cancer Other   . Prostate cancer Other   . Heart disease Mother   . Heart disease Brother   . Colon cancer Neg Hx   . Rectal cancer Neg Hx   . Stomach cancer Neg Hx     Review of Systems:  As stated in the HPI and otherwise negative.   BP 118/80 mmHg  Pulse 89  Ht 5\' 7"  (1.702 m)  Wt 185 lb 6.4 oz (84.097 kg)  BMI 29.03 kg/m2  SpO2 95%  Physical Examination: General: Well developed, well nourished, NAD HEENT: OP clear, mucus membranes moist SKIN: warm, dry. No rashes. Neuro: No focal deficits Musculoskeletal: Muscle strength 5/5 all ext Psychiatric: Mood and affect normal Neck: No JVD, no carotid bruits, no thyromegaly, no lymphadenopathy. Lungs:Clear bilaterally, no wheezes, rhonci, crackles Cardiovascular: Regular rate and rhythm. No murmurs, gallops or rubs. Abdomen:Soft. Bowel sounds present. Non-tender.  Extremities: No lower extremity edema. Pulses are 2 + in the bilateral DP/PT.  Echo 10/13: Left ventricle: The cavity size was mildly dilated. Wall thickness was increased in a pattern of mild LVH. Systolic function was normal. The estimated ejection fraction was in the range of 50% to 55%. Wall motion was normal; there were no regional wall motion abnormalities. - Aortic valve: Trivial regurgitation. - Mitral valve: Mild regurgitation. - Left atrium: The atrium was mildly dilated. - Atrial septum: No defect or patent foramen ovale was Identified.  Exercise stress myoview 04/09/14: Stress Procedure: The patient exercised on the treadmill utilizing the [name] Protocol for 6:00 minutes. The patient stopped due to fatigue and denied any chest  pain. Technetium 68m Sestamibi was injected at peak exercise and myocardial perfusion imaging was performed after a brief delay. Stress ECG: No significant change from baseline ECG, very frequent PVCs  QPS Raw Data Images: Normal; no motion artifact; normal heart/lung ratio. Stress Images: A large size severe severity perfusion defect in the entire inferior, basal and mid inferolateral and basal inferior walls.  Rest Images:  A large size severe severity perfusion defect in the entire inferior, basal and mid inferolateral and basal inferior walls.  Subtraction (SDS): No ischemia.  Transient Ischemic Dilatation (Normal <1.22): 0.97 Lung/Heart Ratio (Normal <0.45): 0.43  Quantitative Gated Spect Images QGS EDV: n/a QGS ESV: n/a  Impression Exercise Capacity: Lexiscan with no exercise. BP Response: Normal blood pressure response. Clinical Symptoms: No symptoms. ECG Impression: No significant ST segment change suggestive of ischemia.very frequent PVCs.  Comparison with Prior Nuclear Study: No change when compared to study from 07/07/11.  Overall Impression: Intermediate risk stress nuclear study with a large scar in the RCA territory, no ischemia. .  LV Ejection Fraction: Study not gated. LV Wall Motion: Study not gated  EKG:  EKG is ordered today. The ekg ordered today demonstrates NSR, rate 89 bpm. PVC.   Recent Labs: 03/20/2015: ALT 20; BUN 17; Creatinine, Ser 1.01; Hemoglobin 16.3; Platelets 234.0; Potassium 4.4; Sodium 140; TSH 1.20   Lipid Panel    Component Value Date/Time   CHOL 131 03/20/2015 1122   CHOL 118 04/03/2014 0843   TRIG 130.0 03/20/2015 1122   TRIG 162* 04/03/2014 0843   HDL 39.80 03/20/2015 1122   HDL 36* 04/03/2014 0843   CHOLHDL 3 03/20/2015 1122   VLDL 26.0 03/20/2015 1122   LDLCALC 65 03/20/2015 1122   LDLCALC 50 04/03/2014 0843   LDLDIRECT 91.5 02/13/2008 1400     Wt Readings from Last 3 Encounters:  11/17/15 185 lb 6.4 oz (84.097  kg)  03/20/15 181 lb 0.6 oz (82.119 kg)  03/10/15 179 lb (81.194 kg)     Other studies Reviewed: Additional studies/ records that were reviewed today include: . Review of the above records demonstrates:    Assessment and Plan:   1. CORONARY ARTERY DISEASE: He has had no recent chest pain. Continue ASA, Plavix, statin.    2. PVCs: Asymptomatic. He did not tolerate Cardizem.   3. HLD: Last LDL well controlled. Continue statin.   Current medicines are reviewed at length with the patient today.  The patient does not have concerns regarding medicines.  The following changes have been made:  no change  Labs/ tests ordered today include:   Orders Placed This Encounter  Procedures  . EKG 12-Lead    Disposition:   FU with me in 6 months  Signed, Lauree Chandler, MD 11/17/2015 10:25 AM    [facility] [place], [place],   [address] Phone: [phone]; Fax: [phone]

## 2015-11-17 NOTE — Patient Instructions (Signed)

## 2015-11-27 ENCOUNTER — Encounter: Payer: Self-pay | Admitting: Cardiovascular Disease

## 2015-11-27 MED ORDER — ROSUVASTATIN CALCIUM 5 MG PO TABS: ORAL_TABLET | ORAL | Status: DC

## 2015-11-28 ENCOUNTER — Encounter: Payer: Self-pay | Admitting: Cardiovascular Disease

## 2015-11-28 ENCOUNTER — Other Ambulatory Visit: Payer: Self-pay | Admitting: *Deleted

## 2015-11-28 MED ORDER — ROSUVASTATIN CALCIUM 5 MG PO TABS: ORAL_TABLET | ORAL | Status: DC

## 2015-12-01 ENCOUNTER — Encounter: Payer: Self-pay | Admitting: Cardiovascular Disease

## 2015-12-04 ENCOUNTER — Telehealth: Payer: Self-pay

## 2015-12-04 NOTE — Telephone Encounter (Signed)
Prior auth for Rosuvastatin sent to [facility].

## 2015-12-08 ENCOUNTER — Telehealth: Payer: Self-pay

## 2015-12-08 NOTE — Telephone Encounter (Signed)
Crestor 5mg  approved through 12/04/2016.

## 2015-12-18 DIAGNOSIS — Z961 Presence of intraocular lens: Secondary | ICD-10-CM | POA: Diagnosis not present

## 2015-12-18 DIAGNOSIS — H02834 Dermatochalasis of left upper eyelid: Secondary | ICD-10-CM | POA: Diagnosis not present

## 2015-12-18 DIAGNOSIS — H43813 Vitreous degeneration, bilateral: Secondary | ICD-10-CM | POA: Diagnosis not present

## 2015-12-18 DIAGNOSIS — H02831 Dermatochalasis of right upper eyelid: Secondary | ICD-10-CM | POA: Diagnosis not present

## 2015-12-18 DIAGNOSIS — H26492 Other secondary cataract, left eye: Secondary | ICD-10-CM | POA: Diagnosis not present

## 2016-01-29 ENCOUNTER — Ambulatory Visit: Payer: Medicare Other | Admitting: Cardiovascular Disease

## 2016-03-02 ENCOUNTER — Telehealth: Payer: Self-pay

## 2016-03-02 NOTE — Telephone Encounter (Signed)
LVM for pt to call back as soon as possible.   RE: Flu Vaccine 2016-2017

## 2016-03-02 NOTE — Telephone Encounter (Signed)
Health Maintenance  updated

## 2016-03-02 NOTE — Telephone Encounter (Signed)
Pt had a flu shot in [place] on 09/2015

## 2016-03-18 DIAGNOSIS — N281 Cyst of kidney, acquired: Secondary | ICD-10-CM | POA: Insufficient documentation

## 2016-03-18 DIAGNOSIS — R319 Hematuria, unspecified: Secondary | ICD-10-CM | POA: Diagnosis not present

## 2016-04-19 ENCOUNTER — Telehealth: Payer: Self-pay

## 2016-04-19 NOTE — Telephone Encounter (Signed)
Call to Larry [name] and agreed to stay post 2:30 apt with Dr. Jenny Reichmann for AWV

## 2016-04-20 ENCOUNTER — Encounter: Payer: Self-pay | Admitting: Internal Medicine

## 2016-04-20 ENCOUNTER — Ambulatory Visit (INDEPENDENT_AMBULATORY_CARE_PROVIDER_SITE_OTHER): Payer: Medicare Other | Admitting: Internal Medicine

## 2016-04-20 VITALS — BP 118/78 | HR 74 | Temp 98.1°F | Ht 67.0 in | Wt 188.2 lb

## 2016-04-20 DIAGNOSIS — E785 Hyperlipidemia, unspecified: Secondary | ICD-10-CM

## 2016-04-20 DIAGNOSIS — R269 Unspecified abnormalities of gait and mobility: Secondary | ICD-10-CM | POA: Diagnosis not present

## 2016-04-20 DIAGNOSIS — M48061 Spinal stenosis, lumbar region without neurogenic claudication: Secondary | ICD-10-CM

## 2016-04-20 DIAGNOSIS — Z0000 Encounter for general adult medical examination without abnormal findings: Secondary | ICD-10-CM

## 2016-04-20 DIAGNOSIS — I10 Essential (primary) hypertension: Secondary | ICD-10-CM | POA: Diagnosis not present

## 2016-04-20 DIAGNOSIS — M4806 Spinal stenosis, lumbar region: Secondary | ICD-10-CM | POA: Diagnosis not present

## 2016-04-20 DIAGNOSIS — R739 Hyperglycemia, unspecified: Secondary | ICD-10-CM

## 2016-04-20 NOTE — Patient Instructions (Addendum)
Please continue all other medications as before, and refills have been done if requested.  Please have the pharmacy call with any other refills you may need.  Please continue your efforts at being more active, low cholesterol diet, and weight control.  You are otherwise up to date with prevention measures today.  Please keep your appointments with your specialists as you may have planned  Please go to the LAB in the Basement (turn left off the elevator) for the tests to be done tomorrow  You will be contacted by phone if any changes need to be made immediately.  Otherwise, you will receive a letter about your results with an explanation, but please check with MyChart first.  Please remember to sign up for MyChart if you have not done so, as this will be important to you in the future with finding out test results, communicating by private email, and scheduling acute appointments online when needed.  Please return in 1 year for your yearly visit, or sooner if needed  Larry [name] , Thank you for taking time to come for your Medicare Wellness Visit. I appreciate your ongoing commitment to your health goals. Please review the following plan we discussed and let me know if I can assist you in the future.   Can review the [email or web address] for tinnitus if you would like   These are the goals we discussed: Goals    . LDL CALC < 70    . patient     Goal is to stay healthy and continue to progress as tolerated        This is a list of the screening recommended for you and due dates:  Health Maintenance  Topic Date Due  . Flu Shot  07/20/2016  . Tetanus Vaccine  03/13/2023  . Shingles Vaccine  Addressed  . Pneumonia vaccines  Completed

## 2016-04-20 NOTE — Progress Notes (Signed)
Subjective:   Larry [name] is a 82 y.o. male who presents for Medicare Annual/Subsequent preventive examination.  Review of Systems:  HRA assessment completed during visit; Bogard_Donald  The Patient was informed that this wellness visit is to identify risk and educate on how to reduce risk for increase disease through lifestyle changes.   ROS deferred to CPE exam with physician today Family and medical hx given below;   Lifestyle review:  HTN: in good control  CAD bypass graft / hx of MI/ has had one episode; about 7 years; No stent placed but started on Plavix.   Rotator cuff on the right; full recovery:  Just went to urologist per Dr. Jenny Reichmann; followed up in Urology for kidney stones x 82 yo; cyst on kidney found; [facility]en at [facility]; checked with another u/s as directed annually but was in [place]; cyst still appears to be benign   Risk further incident  3V CABG in 1999 at [facility] in [place] Lumbar disc since a very young age s/p surgery  Hyperlipidemia - Lipids 02/2015 cho 131; Trig 130; HDL 39; LDL 65 A1c 5.8 glucose 112  Tobacco; No  ETOH: rare  Medication review/ not taking Niaspan per Dr. Angelena Form  BMI: 29.4  Diet;  "Eats anything"; Breakfast; went to classes and learned what he should eat;  AM: he cuts up fruit Dinner; general meat and potatoes; vegetables;  Likes Carb's: Educated to avoid High fructose corn syrup   Exercise; exercises every day  Does stretches; Stationary bike x 15 minutes;  Used to walk but now can't walk very far  Water aerobics class;   Was exercising and hurt back; herniated disc; tx with injection Also has Spinal stenosis; right leg will go numb; Followed by left  Doesn't use the cane except when he goes out.  Was doing leg lifts when he injured his back    [place]; In transition; sold home in [place] and looking for home here in [place].  Home? One level; will have everything they need on first floor; Business on upper level Age in  place? In home Railing as needed will be added  Bathroom safety will have a free standing shower  Community safety; Looking to buy in one of 3 neighborhoods; all nice  Smoke detectors yes Firearms safety reviewed and will keep in a safe place if these exist.   Depression: Denies feeling depressed or hopeless; voices pleasure in daily life Mood stable;   Cognitive; Presents with no issues; Engaged in [place], medications; no failures of task Ad8 score reviewed for issues; States he does have slow recall; s/p CABG; Was told this may happen and will generally not get worse.  . Issues making decisions: no . Less interest in hobbies / activities: no . Repeats questions, stories; family complaining: no . Trouble using ordinary gadgets; microwave; computer NO . Forgets the month or year: no . Mismanaging finances NO . Missing apt: no . Daily problems with thinking of memory sometimes    Fall assessment: no falls  Gait assessment appears normal; walks with cane; right leg goes numb first when up but has compensated well; Wife recommended standing walker with seat to be able to take longer walks or had grand-dtr's graduation this week end; Discussed management of disability   Mobilization and Functional losses from last year to this year? yes  Sleep pattern changes; no   Urinary or fecal incontinence reviewed/ med for urinary issues;   Counseling Health Maintenance  Colonoscopy; 11/2014 - aged out EKG: 10/2015  Hearing: 4000hz  right ; 2000 hz in left  [employer].es tinnitis  Ophthalmology exam; eye exam due in august Dr. Katy Fitch Seen the dentist today  Immunizations Due none due  Advanced Directive; completed   Health Recommendations and Referrals  Current Care Team reviewed and updated   Cardiac Risk Factors include: advanced age (>33men, >63 women);hypertension;male gender     Objective:    Vitals: BP 118/78 mmHg  Pulse 74  Temp(Src) 98.1  F (36.7 C) (Oral)  Ht 5\' 7"  (1.702 m)  Wt 188 lb 4 oz (85.39 kg)  BMI 29.48 kg/m2  SpO2 94%  Body mass index is 29.48 kg/(m^2).  Tobacco History  Smoking status  . Never Smoker   Smokeless tobacco  . Never Used     Counseling given: Yes   Past Medical History  Diagnosis Date  . History of colonic polyps   . ED (erectile dysfunction)   . Diverticulosis of colon   . CAD (coronary artery disease)     S/P CABG in 1999   . Hyperlipidemia     Low HDL  . Decreased exercise tolerance     Exertional fatigue  . Lumbar disc disease     Hx of with recent back pain and buttock pain  . S/P removal of thyroid nodule   . History of MI (myocardial infarction)   . Right knee meniscal tear   . Degenerative arthritis of hip   . Hx of adenomatous colonic polyps 2000  . Allergic rhinitis, cause unspecified 03/12/2013  . Spinal stenosis of lumbar region 03/15/2014  . Erectile dysfunction 06/03/2014  . Allergy   . Cataract   . Myocardial infarction ([place]) 1999  . History of kidney stones    Past Surgical History  Procedure Laterality Date  . Coronary artery bypass graft  1999  . Cataract extraction    . Ptca    . Tonsillectomy    . Vasectomy    . Nodules on thyroid    . Lumbar disc surgery    . Colonoscopy    . Polypectomy    . Cardiac catheterization    . Shoulder arthroscopy with rotator cuff repair and subacromial decompression Right 01/30/2015    Procedure: RIGHT ARTHROSCOPY SHOULDER SUBACROMIAL DECOMPRESSION DISTAL CLAVICAL RESECTION RETATOR CUFF REPAIR;  Surgeon: Justice Britain, MD;  Location: [place];  Service: Orthopedics;  Laterality: Right;   Family History  Problem Relation Age of Onset  . Prostate cancer Other   . Prostate cancer Other   . Heart disease Mother   . Heart disease Brother   . Colon cancer Neg Hx   . Rectal cancer Neg Hx   . Stomach cancer Neg Hx    History  Sexual Activity  . Sexual Activity: Not on file    Outpatient Encounter Prescriptions as of  04/20/2016  Medication Sig  . acetaminophen (TYLENOL) 650 MG CR tablet Take 650 mg by mouth every 8 (eight) hours as needed for pain.   Marland Kitchen aspirin 81 MG EC tablet Take 81 mg by mouth at bedtime.   . calcium-vitamin D (OSCAL WITH D) 500-200 MG-UNIT per tablet Take 1 tablet by mouth 2 (two) times daily.   . clopidogrel (PLAVIX) 75 MG tablet TAKE 1 TABLET EVERY DAY  . fish oil-omega-3 fatty acids 1000 MG capsule Take 1 g by mouth 2 (two) times daily.   . folic acid (FOLVITE) A999333 MCG tablet Take 400 mcg by mouth daily.    Marland Kitchen  Multiple Vitamin (MULTIVITAMIN WITH MINERALS) TABS tablet Take 1 tablet by mouth daily. Centrum Silver  . rosuvastatin (CRESTOR) 5 MG tablet 5 MG  EVERY OTHER DAY  ALT  WITH  10 MG  . sildenafil (VIAGRA) 100 MG tablet Take 100 mg by mouth daily as needed for erectile dysfunction.  . tamsulosin (FLOMAX) 0.4 MG CAPS capsule Take 1 capsule (0.4 mg total) by mouth daily.  . niacin (NIASPAN) 1000 MG CR tablet Take 1 tablet (1,000 mg total) by mouth at bedtime. (Patient not taking: Reported on 04/20/2016)   No facility-administered encounter medications on file as of 04/20/2016.    Activities of Daily Living In your present state of health, do you have any difficulty performing the following activities: 04/20/2016  Hearing? N  Vision? N  Difficulty concentrating or making decisions? N  Walking or climbing stairs? Y  Dressing or bathing? N  Doing errands, shopping? N  Preparing Food and eating ? N  Using the Toilet? N  In the past six months, have you accidently leaked urine? N  Do you have problems with loss of bowel control? N  Managing your Medications? N  Managing your Finances? N  Housekeeping or managing your Housekeeping? N    Patient Care Team: Biagio Borg, MD as PCP - General   Assessment:     Exercise Activities and Dietary recommendations Current Exercise Habits: Home exercise routine, Time (Minutes): 30 (does do 42 miin with low weights and walks), Frequency  (Times/Week): 4, Weekly Exercise (Minutes/Week): 120, Intensity: Mild, Exercise limited by: orthopedic condition(s);neurologic condition(s)  Goals    . LDL CALC < 70    . patient     Goal is to stay healthy and continue to progress as tolerated       Fall Risk Fall Risk  04/20/2016 03/20/2015 03/15/2014  Falls in the past year? No No No   Depression Screen PHQ 2/9 Scores 04/20/2016 03/20/2015 03/15/2014  PHQ - 2 Score 0 1 0    Cognitive Testing MMSE - Mini Mental State Exam 04/20/2016 04/20/2016  Not completed: (No Data) (No Data)   Ad8 is good; has some delay in responses and word finding since CABG   Immunization History  Administered Date(s) Administered  . Influenza Split 10/01/2011, 09/06/2012  . Influenza Whole 09/20/2007, 10/04/2008, 10/13/2009, 09/07/2010  . Influenza,inj,Quad PF,36+ Mos 09/26/2013, 09/19/2014  . Influenza-Unspecified 10/15/2015  . Pneumococcal Conjugate-13 03/15/2014  . Pneumococcal Polysaccharide-23 01/20/2007  . Td 01/20/2002  . Tetanus 03/12/2013   Screening Tests Health Maintenance  Topic Date Due  . INFLUENZA VACCINE  07/20/2016  . TETANUS/TDAP  03/13/2023  . ZOSTAVAX  Addressed  . PNA vac Low Risk Adult  Completed      Plan:      During the course of the visit the patient was educated and counseled about the following appropriate screening and preventive services:   Vaccines to include Pneumoccal, Influenza, Hepatitis B, Td, Zostavax, HCV/ up to date  Electrocardiogram deferred to cardiology   Cardiovascular Disease/ BP good control   Colorectal cancer screening- aged out  Diabetes screening/ neg  Prostate Cancer Screening/ deferred urology   Glaucoma screening/ see's Dr. Katy Fitch  Nutrition counseling / discussed; BMI preferred range 27-28  Continues to try and exercise  Smoking cessation counseling/n/a  Patient Instructions (the written plan) was given to the patient.    Wynetta Fines, RN  04/20/2016

## 2016-04-20 NOTE — Progress Notes (Signed)
Subjective:    Patient ID: Larry [name], male    DOB: November 02, 1932, 82 y.o.   MRN: [id number]  HPI  Here to f/u; overall doing ok,  Pt denies chest pain, increasing sob or doe, wheezing, orthopnea, PND, increased LE swelling, palpitations, dizziness or syncope.  Pt denies new neurological symptoms such as new headache, or facial or extremity weakness or numbness.  Pt denies polydipsia, polyuria, or low sugar episode.   Pt denies new neurological symptoms such as new headache, or facial or extremity weakness or numbness.   Pt states overall good compliance with meds, mostly trying to follow appropriate diet, with wt overall stable,  but little exercise however. Recent dx lumbar disc disease/spinal stenosis with cane today, s/p ESI, having PT who suggests rollater with seat due to distance limitation  Currently living with sister in law in [place] with wife, but plan to move back when able to find a condo. Past Medical History  Diagnosis Date  . History of colonic polyps   . ED (erectile dysfunction)   . Diverticulosis of colon   . CAD (coronary artery disease)     S/P CABG in 1999   . Hyperlipidemia     Low HDL  . Decreased exercise tolerance     Exertional fatigue  . Lumbar disc disease     Hx of with recent back pain and buttock pain  . S/P removal of thyroid nodule   . History of MI (myocardial infarction)   . Right knee meniscal tear   . Degenerative arthritis of hip   . Hx of adenomatous colonic polyps 2000  . Allergic rhinitis, cause unspecified 03/12/2013  . Spinal stenosis of lumbar region 03/15/2014  . Erectile dysfunction 06/03/2014  . Allergy   . Cataract   . Myocardial infarction ([place]) 1999  . History of kidney stones    Past Surgical History  Procedure Laterality Date  . Coronary artery bypass graft  1999  . Cataract extraction    . Ptca    . Tonsillectomy    . Vasectomy    . Nodules on thyroid    . Lumbar disc surgery    . Colonoscopy    . Polypectomy    .  Cardiac catheterization    . Shoulder arthroscopy with rotator cuff repair and subacromial decompression Right 01/30/2015    Procedure: RIGHT ARTHROSCOPY SHOULDER SUBACROMIAL DECOMPRESSION DISTAL CLAVICAL RESECTION RETATOR CUFF REPAIR;  Surgeon: Justice Britain, MD;  Location: [place];  Service: Orthopedics;  Laterality: Right;    reports that he has never smoked. He has never used smokeless tobacco. He reports that he drinks alcohol. He reports that he does not use illicit drugs. family history includes Heart disease in his brother and mother; Prostate cancer in his other and other. There is no history of Colon cancer, Rectal cancer, or Stomach cancer. Allergies  Allergen Reactions  . Beta Adrenergic Blockers Other (See Comments)    Dropped blood pressure too low  . Simvastatin Other (See Comments)    myalgia   Current Outpatient Prescriptions on File Prior to Visit  Medication Sig Dispense Refill  . acetaminophen (TYLENOL) 650 MG CR tablet Take 650 mg by mouth every 8 (eight) hours as needed for pain.     Marland Kitchen aspirin 81 MG EC tablet Take 81 mg by mouth at bedtime.     . calcium-vitamin D (OSCAL WITH D) 500-200 MG-UNIT per tablet Take 1 tablet by mouth 2 (two) times daily.     Marland Kitchen  clopidogrel (PLAVIX) 75 MG tablet TAKE 1 TABLET EVERY DAY 90 tablet 3  . fish oil-omega-3 fatty acids 1000 MG capsule Take 1 g by mouth 2 (two) times daily.     . folic acid (FOLVITE) A999333 MCG tablet Take 400 mcg by mouth daily.      . Multiple Vitamin (MULTIVITAMIN WITH MINERALS) TABS tablet Take 1 tablet by mouth daily. Centrum Silver    . rosuvastatin (CRESTOR) 5 MG tablet 5 MG  EVERY OTHER DAY  ALT  WITH  10 MG 45 tablet 3  . sildenafil (VIAGRA) 100 MG tablet Take 100 mg by mouth daily as needed for erectile dysfunction.    . tamsulosin (FLOMAX) 0.4 MG CAPS capsule Take 1 capsule (0.4 mg total) by mouth daily. 90 capsule 3  . niacin (NIASPAN) 1000 MG CR tablet Take 1 tablet (1,000 mg total) by mouth at bedtime. (Patient  not taking: Reported on 04/20/2016) 90 tablet 3   No current facility-administered medications on file prior to visit.   Review of Systems   Constitutional: Negative for unusual diaphoresis or night sweats HENT: Negative for ear swelling or discharge Eyes: Negative for worsening visual haziness  Respiratory: Negative for choking and stridor.   Gastrointestinal: Negative for distension or worsening eructation Genitourinary: Negative for retention or change in urine volume.  Musculoskeletal: Negative for other MSK pain or swelling Skin: Negative for color change and worsening wound Neurological: Negative for tremors and numbness other than noted  Psychiatric/Behavioral: Negative for decreased concentration or agitation other than above       Objective:   Physical Exam BP 118/78 mmHg  Pulse 74  Temp(Src) 98.1 F (36.7 C) (Oral)  Ht 5\' 7"  (1.702 m)  Wt 188 lb 4 oz (85.39 kg)  BMI 29.48 kg/m2  SpO2 94% VS noted,  Constitutional: Pt appears in no apparent distress HENT: Head: NCAT.  Right Ear: External ear normal.  Left Ear: External ear normal.  Eyes: . Pupils are equal, round, and reactive to light. Conjunctivae and EOM are normal Neck: Normal range of motion. Neck supple.  Cardiovascular: Normal rate and regular rhythm.   Pulmonary/Chest: Effort normal and breath sounds without rales or wheezing.  Abd:  Soft, NT, ND, + BS Neurological: Pt is alert. motor grossly intact, has unsteady gait Skin: Skin is warm. No rash, no LE edema Psychiatric: Pt behavior is normal. No agitation.     Assessment & Plan:

## 2016-04-21 ENCOUNTER — Other Ambulatory Visit (INDEPENDENT_AMBULATORY_CARE_PROVIDER_SITE_OTHER): Payer: Medicare Other

## 2016-04-21 DIAGNOSIS — L821 Other seborrheic keratosis: Secondary | ICD-10-CM | POA: Diagnosis not present

## 2016-04-21 DIAGNOSIS — L570 Actinic keratosis: Secondary | ICD-10-CM | POA: Diagnosis not present

## 2016-04-21 DIAGNOSIS — Z85828 Personal history of other malignant neoplasm of skin: Secondary | ICD-10-CM | POA: Diagnosis not present

## 2016-04-21 DIAGNOSIS — L814 Other melanin hyperpigmentation: Secondary | ICD-10-CM | POA: Diagnosis not present

## 2016-04-21 DIAGNOSIS — R739 Hyperglycemia, unspecified: Secondary | ICD-10-CM

## 2016-04-21 DIAGNOSIS — E785 Hyperlipidemia, unspecified: Secondary | ICD-10-CM

## 2016-04-21 DIAGNOSIS — D1800 Hemangioma unspecified site: Secondary | ICD-10-CM | POA: Diagnosis not present

## 2016-04-21 DIAGNOSIS — D225 Melanocytic nevi of trunk: Secondary | ICD-10-CM | POA: Diagnosis not present

## 2016-04-21 DIAGNOSIS — L820 Inflamed seborrheic keratosis: Secondary | ICD-10-CM | POA: Diagnosis not present

## 2016-04-21 DIAGNOSIS — L720 Epidermal cyst: Secondary | ICD-10-CM | POA: Diagnosis not present

## 2016-04-21 LAB — LIPID PANEL
Cholesterol: 126 mg/dL (ref 0–200)
HDL: 33.5 mg/dL — AB (ref >39.00)
LDL Cholesterol: 65 mg/dL (ref 0–99)
NONHDL: 92.93
Total CHOL/HDL Ratio: 4
Triglycerides: 141 mg/dL (ref 0.0–149.0)
VLDL: 28.2 mg/dL (ref 0.0–40.0)

## 2016-04-21 LAB — HEPATIC FUNCTION PANEL
ALBUMIN: 4.3 g/dL (ref 3.5–5.2)
ALT: 21 U/L (ref 0–53)
AST: 26 U/L (ref 0–37)
Alkaline Phosphatase: 34 U/L — ABNORMAL LOW (ref 39–117)
Bilirubin, Direct: 0.1 mg/dL (ref 0.0–0.3)
TOTAL PROTEIN: 7.3 g/dL (ref 6.0–8.3)
Total Bilirubin: 0.9 mg/dL (ref 0.2–1.2)

## 2016-04-21 LAB — CBC WITH DIFFERENTIAL/PLATELET
BASOS PCT: 0.3 % (ref 0.0–3.0)
Basophils Absolute: 0 10*3/uL (ref 0.0–0.1)
EOS PCT: 3.5 % (ref 0.0–5.0)
Eosinophils Absolute: 0.2 10*3/uL (ref 0.0–0.7)
HEMATOCRIT: 46.2 % (ref 39.0–52.0)
Hemoglobin: 16 g/dL (ref 13.0–17.0)
LYMPHS PCT: 37.5 % (ref 12.0–46.0)
Lymphs Abs: 2.6 10*3/uL (ref 0.7–4.0)
MCHC: 34.7 g/dL (ref 30.0–36.0)
MCV: 90.1 fl (ref 78.0–100.0)
MONOS PCT: 10.7 % (ref 3.0–12.0)
Monocytes Absolute: 0.7 10*3/uL (ref 0.1–1.0)
NEUTROS ABS: 3.3 10*3/uL (ref 1.4–7.7)
Neutrophils Relative %: 48 % (ref 43.0–77.0)
PLATELETS: 242 10*3/uL (ref 150.0–400.0)
RBC: 5.13 Mil/uL (ref 4.22–5.81)
RDW: 13.5 % (ref 11.5–15.5)
WBC: 6.9 10*3/uL (ref 4.0–10.5)

## 2016-04-21 LAB — URINALYSIS, ROUTINE W REFLEX MICROSCOPIC
BILIRUBIN URINE: NEGATIVE
Hgb urine dipstick: NEGATIVE
KETONES UR: NEGATIVE
Leukocytes, UA: NEGATIVE
Nitrite: NEGATIVE
PH: 6 (ref 5.0–8.0)
RBC / HPF: NONE SEEN (ref 0–?)
SPECIFIC GRAVITY, URINE: 1.025 (ref 1.000–1.030)
TOTAL PROTEIN, URINE-UPE24: NEGATIVE
UROBILINOGEN UA: 0.2 (ref 0.0–1.0)
Urine Glucose: NEGATIVE

## 2016-04-21 LAB — BASIC METABOLIC PANEL
BUN: 18 mg/dL (ref 6–23)
CHLORIDE: 105 meq/L (ref 96–112)
CO2: 26 meq/L (ref 19–32)
Calcium: 9.7 mg/dL (ref 8.4–10.5)
Creatinine, Ser: 1.19 mg/dL (ref 0.40–1.50)
GFR: 62.3 mL/min (ref >60.00)
Glucose, Bld: 116 mg/dL — ABNORMAL HIGH (ref 70–99)
POTASSIUM: 4.2 meq/L (ref 3.5–5.1)
SODIUM: 141 meq/L (ref 135–145)

## 2016-04-21 LAB — TSH: TSH: 1.81 u[IU]/mL (ref 0.35–4.50)

## 2016-04-21 LAB — HEMOGLOBIN A1C: HEMOGLOBIN A1C: 5.8 % (ref 4.6–6.5)

## 2016-04-25 NOTE — Assessment & Plan Note (Signed)
As above, cont pain control as well,  to f/u any worsening symptoms or concerns

## 2016-04-25 NOTE — Assessment & Plan Note (Signed)
stable overall by history and exam, recent data reviewed with pt, and pt to continue medical treatment as before,  to f/u any worsening symptoms or concerns Lab Results  Component Value Date   HGBA1C 5.8 04/21/2016   

## 2016-04-25 NOTE — Assessment & Plan Note (Signed)
stable overall by history and exam, recent data reviewed with pt, and pt to continue medical treatment as before,  to f/u any worsening symptoms or concerns Lab Results  Component Value Date   LDLCALC 65 04/21/2016

## 2016-04-25 NOTE — Assessment & Plan Note (Signed)
With gait dysfxn, for rollater, and handicapped parking permit,  to f/u any worsening symptoms or concerns

## 2016-04-25 NOTE — Assessment & Plan Note (Signed)
stable overall by history and exam, recent data reviewed with pt, and pt to continue medical treatment as before,  to f/u any worsening symptoms or concerns BP Readings from Last 3 Encounters:  04/20/16 118/78  11/17/15 118/80  03/20/15 118/78

## 2016-05-18 ENCOUNTER — Other Ambulatory Visit: Payer: Self-pay | Admitting: Internal Medicine

## 2016-06-14 ENCOUNTER — Telehealth: Payer: Self-pay | Admitting: Cardiovascular Disease

## 2016-06-14 NOTE — Telephone Encounter (Signed)
New message    Pt wife is calling for rn to see what dates can be accommodated for pt

## 2016-06-14 NOTE — Telephone Encounter (Signed)
Spoke with pt's wife. Pt will be in [facility] 7/6-7/11 and would like to schedule follow up appt.  Wife reports pt is not having any issues.  I told pt's wife that Dr. Angelena Form is not in the office those days.  I offered appt with PA or NP but they would like to wait until they are back in [place] to see Dr. Angelena Form.  Appt made for August 31,2017 at 9:15.

## 2016-08-05 ENCOUNTER — Encounter: Payer: Self-pay | Admitting: Cardiovascular Disease

## 2016-08-19 ENCOUNTER — Ambulatory Visit (INDEPENDENT_AMBULATORY_CARE_PROVIDER_SITE_OTHER): Payer: Medicare Other | Admitting: Cardiovascular Disease

## 2016-08-19 ENCOUNTER — Encounter: Payer: Self-pay | Admitting: Cardiovascular Disease

## 2016-08-19 VITALS — BP 108/70 | HR 86 | Ht 67.0 in | Wt 187.2 lb

## 2016-08-19 DIAGNOSIS — I493 Ventricular premature depolarization: Secondary | ICD-10-CM

## 2016-08-19 DIAGNOSIS — I25810 Atherosclerosis of coronary artery bypass graft(s) without angina pectoris: Secondary | ICD-10-CM

## 2016-08-19 DIAGNOSIS — E785 Hyperlipidemia, unspecified: Secondary | ICD-10-CM

## 2016-08-19 NOTE — Patient Instructions (Signed)

## 2016-08-19 NOTE — Progress Notes (Signed)
Chief Complaint  Patient presents with  . Coronary Artery Disease     History of Present Illness: 82 yo WM with history of CAD s/p CABG, HLD here for cardiac follow up. He has been followed in the past by Dr. Olevia Perches. He had 3V CABG in 1999 at [facility] in [place]. In 2008 he had a catheterization and was found to have a patent LIMA to LAD and a patent vein graft to the right coronary and no significant obstruction in the circumflex artery. He has been doing well since that time. Exercise stress test April 2015 with no ischemia. He has had PVCs noted on cardiac monitor and was started on Cardizem but he did not tolerate the Cardizem due to dizziness so it was stopped.   He is here today for follow up. He denies chest pain, SOB.  He is very active. He has been walking daily.   Primary Care Physician: Cathlean Cower, MD   Past Medical History:  Diagnosis Date  . Allergic rhinitis, cause unspecified 03/12/2013  . Allergy   . CAD (coronary artery disease)    S/P CABG in 1999   . Cataract   . Decreased exercise tolerance    Exertional fatigue  . Degenerative arthritis of hip   . Diverticulosis of colon   . ED (erectile dysfunction)   . Erectile dysfunction 06/03/2014  . History of colonic polyps   . History of kidney stones   . History of MI (myocardial infarction)   . Hx of adenomatous colonic polyps 2000  . Hyperlipidemia    Low HDL  . Lumbar disc disease    Hx of with recent back pain and buttock pain  . Myocardial infarction ([place]) 1999  . Right knee meniscal tear   . S/P removal of thyroid nodule   . Spinal stenosis of lumbar region 03/15/2014    Past Surgical History:  Procedure Laterality Date  . CARDIAC CATHETERIZATION    . CATARACT EXTRACTION    . COLONOSCOPY    . CORONARY ARTERY BYPASS GRAFT  1999  . LUMBAR DISC SURGERY    . Nodules on Thyroid    . POLYPECTOMY    . PTCA    . SHOULDER ARTHROSCOPY WITH ROTATOR CUFF REPAIR AND SUBACROMIAL DECOMPRESSION Right  01/30/2015   Procedure: RIGHT ARTHROSCOPY SHOULDER SUBACROMIAL DECOMPRESSION DISTAL CLAVICAL RESECTION RETATOR CUFF REPAIR;  Surgeon: Justice Britain, MD;  Location: [facility];  Service: Orthopedics;  Laterality: Right;  . TONSILLECTOMY    . VASECTOMY      Current Outpatient Prescriptions  Medication Sig Dispense Refill  . acetaminophen (TYLENOL) 650 MG CR tablet Take 650 mg by mouth every 8 (eight) hours as needed for pain.     Marland Kitchen aspirin 81 MG EC tablet Take 81 mg by mouth at bedtime.     . calcium-vitamin D (OSCAL WITH D) 500-200 MG-UNIT per tablet Take 1 tablet by mouth 2 (two) times daily.     . cetirizine (ZYRTEC) 10 MG tablet Take 10 mg by mouth daily.    . clopidogrel (PLAVIX) 75 MG tablet Take 75 mg by mouth daily.    . fish oil-omega-3 fatty acids 1000 MG capsule Take 1 g by mouth 2 (two) times daily.     . folic acid (FOLVITE) A999333 MCG tablet Take 400 mcg by mouth daily.      . Multiple Vitamin (MULTIVITAMIN WITH MINERALS) TABS tablet Take 1 tablet by mouth daily. Centrum Silver    . rosuvastatin (CRESTOR)  5 MG tablet 5 MG  EVERY OTHER DAY  ALT  WITH  10 MG 45 tablet 3  . sildenafil (VIAGRA) 100 MG tablet Take 100 mg by mouth daily as needed for erectile dysfunction.    . tamsulosin (FLOMAX) 0.4 MG CAPS capsule Take 1 capsule (0.4 mg total) by mouth daily. 90 capsule 3   No current facility-administered medications for this visit.     Allergies  Allergen Reactions  . Beta Adrenergic Blockers Other (See Comments)    Dropped blood pressure too low  . Simvastatin Other (See Comments)    myalgia    Social History   Social History  . Marital status: Married    Spouse name: N/A  . Number of children: 2  . Years of education: N/A   Occupational History  . 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 Smoking status: Never Smoker  . Smokeless tobacco: Never Used  . Alcohol use 0.0 oz/week     Comment: rare  . Drug use: No  . Sexual activity: Not on  file   Other Topics Concern  . Not on file   Social History Narrative   Moved from [facility]   Married      [employer] on file    Family History  Problem Relation Age of Onset  . Prostate cancer Other   . Prostate cancer Other   . Heart disease Mother   . Heart disease Brother   . Colon cancer Neg Hx   . Rectal cancer Neg Hx   . Stomach cancer Neg Hx     Review of Systems:  As stated in the HPI and otherwise negative.   BP 108/70   Pulse 86   Ht 5\' 7"  (1.702 m)   Wt 187 lb 3.2 oz (84.9 kg)   BMI 29.32 kg/m   Physical Examination: General: Well developed, well nourished, NAD  HEENT: OP clear, mucus membranes moist  SKIN: warm, dry. No rashes. Neuro: No focal deficits  Musculoskeletal: Muscle strength 5/5 all ext  Psychiatric: Mood and affect normal  Neck: No JVD, no carotid bruits, no thyromegaly, no lymphadenopathy.  Lungs:Clear bilaterally, no wheezes, rhonci, crackles Cardiovascular: Regular rate and rhythm. No murmurs, gallops or rubs. Abdomen:Soft. Bowel sounds present. Non-tender.  Extremities: No lower extremity edema. Pulses are 2 + in the bilateral DP/PT.  Echo 10/13: Left ventricle: The cavity size was mildly dilated. Wall thickness was increased in a pattern of mild LVH. Systolic function was normal. The estimated ejection fraction was in the range of 50% to 55%. Wall motion was normal; there were no regional wall motion abnormalities. - Aortic valve: Trivial regurgitation. - Mitral valve: Mild regurgitation. - Left atrium: The atrium was mildly dilated. - Atrial septum: No defect or patent foramen ovale was Identified.  Exercise stress myoview 04/09/14: Stress Procedure: The patient exercised on the treadmill utilizing the [name] Protocol for 6:00 minutes. The patient stopped due to fatigue and denied any chest pain. Technetium 34m Sestamibi was injected at peak exercise and myocardial perfusion imaging was performed after a brief  delay. Stress ECG: No significant change from baseline ECG, very frequent PVCs  QPS Raw Data Images: Normal; no motion artifact; normal heart/lung ratio. Stress Images: A large size severe severity perfusion defect in the entire inferior, basal and mid inferolateral and basal inferior walls.  Rest Images: A large size severe severity perfusion defect in the entire inferior, basal and mid inferolateral and basal  inferior walls.  Subtraction (SDS): No ischemia.  Transient Ischemic Dilatation (Normal <1.22): 0.97 Lung/Heart Ratio (Normal <0.45): 0.43  Quantitative Gated Spect Images QGS EDV: n/a QGS ESV: n/a  Impression Exercise Capacity: Lexiscan with no exercise. BP Response: Normal blood pressure response. Clinical Symptoms: No symptoms. ECG Impression: No significant ST segment change suggestive of ischemia.very frequent PVCs.  Comparison with Prior Nuclear Study: No change when compared to study from 07/07/11.  Overall Impression: Intermediate risk stress nuclear study with a large scar in the RCA territory, no ischemia. .  LV Ejection Fraction: Study not gated. LV Wall Motion: Study not gated  EKG:  EKG is ordered today. The ekg ordered today demonstrates NSR, rate 86 bpm. PVC.   Recent Labs: 04/21/2016: ALT 21; BUN 18; Creatinine, Ser 1.19; Hemoglobin 16.0; Platelets 242.0; Potassium 4.2; Sodium 141; TSH 1.81   Lipid Panel    Component Value Date/Time   CHOL 126 04/21/2016 0910   CHOL 118 04/03/2014 0843   TRIG 141.0 04/21/2016 0910   TRIG 162 (H) 04/03/2014 0843   HDL 33.50 (L) 04/21/2016 0910   HDL 36 (L) 04/03/2014 0843   CHOLHDL 4 04/21/2016 0910   VLDL 28.2 04/21/2016 0910   LDLCALC 65 04/21/2016 0910   LDLCALC 50 04/03/2014 0843   LDLDIRECT 91.5 02/13/2008 1400     Wt Readings from Last 3 Encounters:  08/19/16 187 lb 3.2 oz (84.9 kg)  04/20/16 188 lb 4 oz (85.4 kg)  11/17/15 185 lb 6.4 oz (84.1 kg)     Other studies Reviewed: Additional  studies/ records that were reviewed today include: . Review of the above records demonstrates:    Assessment and Plan:   1. CORONARY ARTERY DISEASE: He has had no recent chest pain. Continue ASA, Plavix, statin.    2. PVCs: Asymptomatic. He did not tolerate Cardizem.   3. HLD: Last LDL well controlled. Continue statin.   Current medicines are reviewed at length with the patient today.  The patient does not have concerns regarding medicines.  The following changes have been made:  no change  Labs/ tests ordered today include:   Orders Placed This Encounter  Procedures  . EKG 12-Lead    Disposition:   FU with me in 6 months  Signed, Lauree Chandler, MD 08/19/2016 10:29 AM    [facility] [place], [place], [place]  [address] Phone: [phone]; Fax: [phone]

## 2016-08-28 DIAGNOSIS — Z23 Encounter for immunization: Secondary | ICD-10-CM | POA: Diagnosis not present

## 2016-09-10 ENCOUNTER — Other Ambulatory Visit: Payer: Self-pay | Admitting: Internal Medicine

## 2016-09-10 ENCOUNTER — Other Ambulatory Visit: Payer: Self-pay | Admitting: Cardiovascular Disease

## 2016-10-25 DIAGNOSIS — M545 Low back pain: Secondary | ICD-10-CM | POA: Diagnosis not present

## 2016-10-27 DIAGNOSIS — G8929 Other chronic pain: Secondary | ICD-10-CM | POA: Diagnosis not present

## 2016-10-27 DIAGNOSIS — M5441 Lumbago with sciatica, right side: Secondary | ICD-10-CM | POA: Diagnosis not present

## 2016-10-27 DIAGNOSIS — M549 Dorsalgia, unspecified: Secondary | ICD-10-CM | POA: Diagnosis not present

## 2016-10-27 DIAGNOSIS — R29898 Other symptoms and signs involving the musculoskeletal system: Secondary | ICD-10-CM | POA: Diagnosis not present

## 2016-10-27 DIAGNOSIS — R200 Anesthesia of skin: Secondary | ICD-10-CM | POA: Diagnosis not present

## 2016-10-27 DIAGNOSIS — M5442 Lumbago with sciatica, left side: Secondary | ICD-10-CM | POA: Diagnosis not present

## 2016-11-02 DIAGNOSIS — M5441 Lumbago with sciatica, right side: Secondary | ICD-10-CM | POA: Diagnosis not present

## 2016-11-02 DIAGNOSIS — R29898 Other symptoms and signs involving the musculoskeletal system: Secondary | ICD-10-CM | POA: Diagnosis not present

## 2016-11-02 DIAGNOSIS — M5442 Lumbago with sciatica, left side: Secondary | ICD-10-CM | POA: Diagnosis not present

## 2016-11-02 DIAGNOSIS — M549 Dorsalgia, unspecified: Secondary | ICD-10-CM | POA: Diagnosis not present

## 2016-11-02 DIAGNOSIS — R200 Anesthesia of skin: Secondary | ICD-10-CM | POA: Diagnosis not present

## 2016-11-02 DIAGNOSIS — G8929 Other chronic pain: Secondary | ICD-10-CM | POA: Diagnosis not present

## 2016-11-05 DIAGNOSIS — M5442 Lumbago with sciatica, left side: Secondary | ICD-10-CM | POA: Diagnosis not present

## 2016-11-05 DIAGNOSIS — R29898 Other symptoms and signs involving the musculoskeletal system: Secondary | ICD-10-CM | POA: Diagnosis not present

## 2016-11-05 DIAGNOSIS — R200 Anesthesia of skin: Secondary | ICD-10-CM | POA: Diagnosis not present

## 2016-11-05 DIAGNOSIS — G8929 Other chronic pain: Secondary | ICD-10-CM | POA: Diagnosis not present

## 2016-11-05 DIAGNOSIS — M549 Dorsalgia, unspecified: Secondary | ICD-10-CM | POA: Diagnosis not present

## 2016-11-05 DIAGNOSIS — M5441 Lumbago with sciatica, right side: Secondary | ICD-10-CM | POA: Diagnosis not present

## 2016-11-16 DIAGNOSIS — M549 Dorsalgia, unspecified: Secondary | ICD-10-CM | POA: Diagnosis not present

## 2016-11-16 DIAGNOSIS — M5442 Lumbago with sciatica, left side: Secondary | ICD-10-CM | POA: Diagnosis not present

## 2016-11-16 DIAGNOSIS — G8929 Other chronic pain: Secondary | ICD-10-CM | POA: Diagnosis not present

## 2016-11-16 DIAGNOSIS — R200 Anesthesia of skin: Secondary | ICD-10-CM | POA: Diagnosis not present

## 2016-11-16 DIAGNOSIS — M5441 Lumbago with sciatica, right side: Secondary | ICD-10-CM | POA: Diagnosis not present

## 2016-11-16 DIAGNOSIS — R29898 Other symptoms and signs involving the musculoskeletal system: Secondary | ICD-10-CM | POA: Diagnosis not present

## 2016-11-18 ENCOUNTER — Encounter: Payer: Self-pay | Admitting: Cardiovascular Disease

## 2016-11-18 DIAGNOSIS — R29898 Other symptoms and signs involving the musculoskeletal system: Secondary | ICD-10-CM | POA: Diagnosis not present

## 2016-11-18 DIAGNOSIS — R200 Anesthesia of skin: Secondary | ICD-10-CM | POA: Diagnosis not present

## 2016-11-18 DIAGNOSIS — M5441 Lumbago with sciatica, right side: Secondary | ICD-10-CM | POA: Diagnosis not present

## 2016-11-18 DIAGNOSIS — G8929 Other chronic pain: Secondary | ICD-10-CM | POA: Diagnosis not present

## 2016-11-18 DIAGNOSIS — M5442 Lumbago with sciatica, left side: Secondary | ICD-10-CM | POA: Diagnosis not present

## 2016-11-18 DIAGNOSIS — M549 Dorsalgia, unspecified: Secondary | ICD-10-CM | POA: Diagnosis not present

## 2016-11-23 DIAGNOSIS — R200 Anesthesia of skin: Secondary | ICD-10-CM | POA: Diagnosis not present

## 2016-11-23 DIAGNOSIS — R29898 Other symptoms and signs involving the musculoskeletal system: Secondary | ICD-10-CM | POA: Diagnosis not present

## 2016-11-23 DIAGNOSIS — M549 Dorsalgia, unspecified: Secondary | ICD-10-CM | POA: Diagnosis not present

## 2016-11-23 DIAGNOSIS — G8929 Other chronic pain: Secondary | ICD-10-CM | POA: Diagnosis not present

## 2016-11-23 DIAGNOSIS — M5442 Lumbago with sciatica, left side: Secondary | ICD-10-CM | POA: Diagnosis not present

## 2016-11-23 DIAGNOSIS — M5441 Lumbago with sciatica, right side: Secondary | ICD-10-CM | POA: Diagnosis not present

## 2016-11-25 DIAGNOSIS — R29898 Other symptoms and signs involving the musculoskeletal system: Secondary | ICD-10-CM | POA: Diagnosis not present

## 2016-11-25 DIAGNOSIS — R200 Anesthesia of skin: Secondary | ICD-10-CM | POA: Diagnosis not present

## 2016-11-25 DIAGNOSIS — G8929 Other chronic pain: Secondary | ICD-10-CM | POA: Diagnosis not present

## 2016-11-25 DIAGNOSIS — M549 Dorsalgia, unspecified: Secondary | ICD-10-CM | POA: Diagnosis not present

## 2016-11-25 DIAGNOSIS — M5442 Lumbago with sciatica, left side: Secondary | ICD-10-CM | POA: Diagnosis not present

## 2016-11-25 DIAGNOSIS — M5441 Lumbago with sciatica, right side: Secondary | ICD-10-CM | POA: Diagnosis not present

## 2016-11-30 DIAGNOSIS — G8929 Other chronic pain: Secondary | ICD-10-CM | POA: Diagnosis not present

## 2016-11-30 DIAGNOSIS — R29898 Other symptoms and signs involving the musculoskeletal system: Secondary | ICD-10-CM | POA: Diagnosis not present

## 2016-11-30 DIAGNOSIS — M5441 Lumbago with sciatica, right side: Secondary | ICD-10-CM | POA: Diagnosis not present

## 2016-11-30 DIAGNOSIS — R200 Anesthesia of skin: Secondary | ICD-10-CM | POA: Diagnosis not present

## 2016-11-30 DIAGNOSIS — M549 Dorsalgia, unspecified: Secondary | ICD-10-CM | POA: Diagnosis not present

## 2016-11-30 DIAGNOSIS — M5442 Lumbago with sciatica, left side: Secondary | ICD-10-CM | POA: Diagnosis not present

## 2016-12-02 DIAGNOSIS — R29898 Other symptoms and signs involving the musculoskeletal system: Secondary | ICD-10-CM | POA: Diagnosis not present

## 2016-12-02 DIAGNOSIS — M5442 Lumbago with sciatica, left side: Secondary | ICD-10-CM | POA: Diagnosis not present

## 2016-12-02 DIAGNOSIS — M549 Dorsalgia, unspecified: Secondary | ICD-10-CM | POA: Diagnosis not present

## 2016-12-02 DIAGNOSIS — R200 Anesthesia of skin: Secondary | ICD-10-CM | POA: Diagnosis not present

## 2016-12-02 DIAGNOSIS — M5441 Lumbago with sciatica, right side: Secondary | ICD-10-CM | POA: Diagnosis not present

## 2016-12-02 DIAGNOSIS — G8929 Other chronic pain: Secondary | ICD-10-CM | POA: Diagnosis not present

## 2016-12-07 DIAGNOSIS — R200 Anesthesia of skin: Secondary | ICD-10-CM | POA: Diagnosis not present

## 2016-12-07 DIAGNOSIS — M549 Dorsalgia, unspecified: Secondary | ICD-10-CM | POA: Diagnosis not present

## 2016-12-07 DIAGNOSIS — M5442 Lumbago with sciatica, left side: Secondary | ICD-10-CM | POA: Diagnosis not present

## 2016-12-07 DIAGNOSIS — R29898 Other symptoms and signs involving the musculoskeletal system: Secondary | ICD-10-CM | POA: Diagnosis not present

## 2016-12-07 DIAGNOSIS — G8929 Other chronic pain: Secondary | ICD-10-CM | POA: Diagnosis not present

## 2016-12-07 DIAGNOSIS — M5441 Lumbago with sciatica, right side: Secondary | ICD-10-CM | POA: Diagnosis not present

## 2016-12-17 DIAGNOSIS — H43813 Vitreous degeneration, bilateral: Secondary | ICD-10-CM | POA: Diagnosis not present

## 2016-12-17 DIAGNOSIS — H26492 Other secondary cataract, left eye: Secondary | ICD-10-CM | POA: Diagnosis not present

## 2016-12-17 DIAGNOSIS — H02831 Dermatochalasis of right upper eyelid: Secondary | ICD-10-CM | POA: Diagnosis not present

## 2016-12-17 DIAGNOSIS — H02834 Dermatochalasis of left upper eyelid: Secondary | ICD-10-CM | POA: Diagnosis not present

## 2016-12-17 DIAGNOSIS — Z961 Presence of intraocular lens: Secondary | ICD-10-CM | POA: Diagnosis not present

## 2016-12-21 DIAGNOSIS — M5442 Lumbago with sciatica, left side: Secondary | ICD-10-CM | POA: Diagnosis not present

## 2016-12-21 DIAGNOSIS — M549 Dorsalgia, unspecified: Secondary | ICD-10-CM | POA: Diagnosis not present

## 2016-12-21 DIAGNOSIS — G8929 Other chronic pain: Secondary | ICD-10-CM | POA: Diagnosis not present

## 2016-12-21 DIAGNOSIS — M5441 Lumbago with sciatica, right side: Secondary | ICD-10-CM | POA: Diagnosis not present

## 2016-12-21 DIAGNOSIS — R200 Anesthesia of skin: Secondary | ICD-10-CM | POA: Diagnosis not present

## 2016-12-21 DIAGNOSIS — R29898 Other symptoms and signs involving the musculoskeletal system: Secondary | ICD-10-CM | POA: Diagnosis not present

## 2016-12-21 DIAGNOSIS — M5417 Radiculopathy, lumbosacral region: Secondary | ICD-10-CM | POA: Diagnosis not present

## 2016-12-21 DIAGNOSIS — M5440 Lumbago with sciatica, unspecified side: Secondary | ICD-10-CM | POA: Diagnosis not present

## 2016-12-28 DIAGNOSIS — M48061 Spinal stenosis, lumbar region without neurogenic claudication: Secondary | ICD-10-CM | POA: Diagnosis not present

## 2016-12-28 DIAGNOSIS — M5416 Radiculopathy, lumbar region: Secondary | ICD-10-CM | POA: Diagnosis not present

## 2016-12-28 DIAGNOSIS — M5410 Radiculopathy, site unspecified: Secondary | ICD-10-CM | POA: Diagnosis not present

## 2017-01-05 DIAGNOSIS — R200 Anesthesia of skin: Secondary | ICD-10-CM | POA: Diagnosis not present

## 2017-01-05 DIAGNOSIS — G8929 Other chronic pain: Secondary | ICD-10-CM | POA: Diagnosis not present

## 2017-01-05 DIAGNOSIS — M5441 Lumbago with sciatica, right side: Secondary | ICD-10-CM | POA: Diagnosis not present

## 2017-01-05 DIAGNOSIS — M549 Dorsalgia, unspecified: Secondary | ICD-10-CM | POA: Diagnosis not present

## 2017-01-05 DIAGNOSIS — M5442 Lumbago with sciatica, left side: Secondary | ICD-10-CM | POA: Diagnosis not present

## 2017-01-05 DIAGNOSIS — R29898 Other symptoms and signs involving the musculoskeletal system: Secondary | ICD-10-CM | POA: Diagnosis not present

## 2017-01-12 DIAGNOSIS — R200 Anesthesia of skin: Secondary | ICD-10-CM | POA: Diagnosis not present

## 2017-01-12 DIAGNOSIS — M5441 Lumbago with sciatica, right side: Secondary | ICD-10-CM | POA: Diagnosis not present

## 2017-01-12 DIAGNOSIS — M5442 Lumbago with sciatica, left side: Secondary | ICD-10-CM | POA: Diagnosis not present

## 2017-01-12 DIAGNOSIS — M549 Dorsalgia, unspecified: Secondary | ICD-10-CM | POA: Diagnosis not present

## 2017-01-12 DIAGNOSIS — R29898 Other symptoms and signs involving the musculoskeletal system: Secondary | ICD-10-CM | POA: Diagnosis not present

## 2017-01-12 DIAGNOSIS — G8929 Other chronic pain: Secondary | ICD-10-CM | POA: Diagnosis not present

## 2017-01-17 DIAGNOSIS — M5442 Lumbago with sciatica, left side: Secondary | ICD-10-CM | POA: Diagnosis not present

## 2017-01-17 DIAGNOSIS — R29898 Other symptoms and signs involving the musculoskeletal system: Secondary | ICD-10-CM | POA: Diagnosis not present

## 2017-01-17 DIAGNOSIS — M549 Dorsalgia, unspecified: Secondary | ICD-10-CM | POA: Diagnosis not present

## 2017-01-17 DIAGNOSIS — R200 Anesthesia of skin: Secondary | ICD-10-CM | POA: Diagnosis not present

## 2017-01-17 DIAGNOSIS — G8929 Other chronic pain: Secondary | ICD-10-CM | POA: Diagnosis not present

## 2017-01-17 DIAGNOSIS — M5441 Lumbago with sciatica, right side: Secondary | ICD-10-CM | POA: Diagnosis not present

## 2017-01-19 DIAGNOSIS — M549 Dorsalgia, unspecified: Secondary | ICD-10-CM | POA: Diagnosis not present

## 2017-01-19 DIAGNOSIS — M5442 Lumbago with sciatica, left side: Secondary | ICD-10-CM | POA: Diagnosis not present

## 2017-01-19 DIAGNOSIS — R29898 Other symptoms and signs involving the musculoskeletal system: Secondary | ICD-10-CM | POA: Diagnosis not present

## 2017-01-19 DIAGNOSIS — R200 Anesthesia of skin: Secondary | ICD-10-CM | POA: Diagnosis not present

## 2017-01-19 DIAGNOSIS — G8929 Other chronic pain: Secondary | ICD-10-CM | POA: Diagnosis not present

## 2017-01-19 DIAGNOSIS — M5441 Lumbago with sciatica, right side: Secondary | ICD-10-CM | POA: Diagnosis not present

## 2017-01-20 ENCOUNTER — Encounter: Payer: Self-pay | Admitting: Cardiovascular Disease

## 2017-02-01 DIAGNOSIS — M5441 Lumbago with sciatica, right side: Secondary | ICD-10-CM | POA: Diagnosis not present

## 2017-02-01 DIAGNOSIS — R29898 Other symptoms and signs involving the musculoskeletal system: Secondary | ICD-10-CM | POA: Diagnosis not present

## 2017-02-01 DIAGNOSIS — M549 Dorsalgia, unspecified: Secondary | ICD-10-CM | POA: Diagnosis not present

## 2017-02-01 DIAGNOSIS — M5442 Lumbago with sciatica, left side: Secondary | ICD-10-CM | POA: Diagnosis not present

## 2017-02-01 DIAGNOSIS — R200 Anesthesia of skin: Secondary | ICD-10-CM | POA: Diagnosis not present

## 2017-02-01 DIAGNOSIS — G8929 Other chronic pain: Secondary | ICD-10-CM | POA: Diagnosis not present

## 2017-02-15 ENCOUNTER — Ambulatory Visit (INDEPENDENT_AMBULATORY_CARE_PROVIDER_SITE_OTHER): Payer: Medicare Other | Admitting: Internal Medicine

## 2017-02-15 ENCOUNTER — Encounter: Payer: Self-pay | Admitting: Internal Medicine

## 2017-02-15 VITALS — BP 108/78 | HR 88 | Temp 97.6°F | Ht 67.0 in | Wt 189.0 lb

## 2017-02-15 DIAGNOSIS — J309 Allergic rhinitis, unspecified: Secondary | ICD-10-CM | POA: Diagnosis not present

## 2017-02-15 DIAGNOSIS — R05 Cough: Secondary | ICD-10-CM

## 2017-02-15 DIAGNOSIS — I10 Essential (primary) hypertension: Secondary | ICD-10-CM

## 2017-02-15 DIAGNOSIS — R739 Hyperglycemia, unspecified: Secondary | ICD-10-CM | POA: Diagnosis not present

## 2017-02-15 DIAGNOSIS — R059 Cough, unspecified: Secondary | ICD-10-CM

## 2017-02-15 MED ORDER — TRIAMCINOLONE ACETONIDE 55 MCG/ACT NA AERO: 2.0000 | INHALATION_SPRAY | Freq: Every day | NASAL | 12 refills | Status: DC

## 2017-02-15 MED ORDER — PANTOPRAZOLE SODIUM 40 MG PO TBEC: 40.0000 mg | DELAYED_RELEASE_TABLET | Freq: Every day | ORAL | 3 refills | Status: DC

## 2017-02-15 MED ORDER — METHYLPREDNISOLONE ACETATE 80 MG/ML IJ SUSP
80.0000 mg | Freq: Once | INTRAMUSCULAR | Status: AC
  Administered 2017-02-15: 80 mg via INTRAMUSCULAR

## 2017-02-15 NOTE — Progress Notes (Signed)
Subjective:    Patient ID: Larry [name], male    DOB: June 27, 1932, 83 y.o.   MRN: [id number]  HPI  Here with persistent cough x 3 yrs, intermittent productive with clearish/yellow sputum, assoc with Does have several wks itch and sneezing, without fever, pain, ST or other worsening pain, swelling or wheezing.  No c/o possible reflux.  Pt denies chest pain, orthopnea, PND, increased LE swelling, palpitations, dizziness or syncope.  Last cxr June 2015.  Wife just about had it with the incessant cough.  Seems uncontrolled with zyrtec though some improved.  Has not tried otc nasal steroid.  Mucinex in the last wk has not helped as well.  Pt denies polydipsia, polyuria, Past Medical History:  Diagnosis Date  . Allergic rhinitis, cause unspecified 03/12/2013  . Allergy   . CAD (coronary artery disease)    S/P CABG in 1999   . Cataract   . Decreased exercise tolerance    Exertional fatigue  . Degenerative arthritis of hip   . Diverticulosis of colon   . ED (erectile dysfunction)   . Erectile dysfunction 06/03/2014  . History of colonic polyps   . History of kidney stones   . History of MI (myocardial infarction)   . Hx of adenomatous colonic polyps 2000  . Hyperlipidemia    Low HDL  . Lumbar disc disease    Hx of with recent back pain and buttock pain  . Myocardial infarction 1999  . Right knee meniscal tear   . S/P removal of thyroid nodule   . Spinal stenosis of lumbar region 03/15/2014   Past Surgical History:  Procedure Laterality Date  . CARDIAC CATHETERIZATION    . CATARACT EXTRACTION    . COLONOSCOPY    . CORONARY ARTERY BYPASS GRAFT  1999  . LUMBAR DISC SURGERY    . Nodules on Thyroid    . POLYPECTOMY    . PTCA    . SHOULDER ARTHROSCOPY WITH ROTATOR CUFF REPAIR AND SUBACROMIAL DECOMPRESSION Right 01/30/2015   Procedure: RIGHT ARTHROSCOPY SHOULDER SUBACROMIAL DECOMPRESSION DISTAL CLAVICAL RESECTION RETATOR CUFF REPAIR;  Surgeon: Justice Britain, MD;  Location: [facility];  Service:  Orthopedics;  Laterality: Right;  . TONSILLECTOMY    . VASECTOMY      reports that he has never smoked. He has never used smokeless tobacco. He reports that he drinks alcohol. He reports that he does not use drugs. family history includes Heart disease in his brother and mother; Prostate cancer in his other and other. Allergies  Allergen Reactions  . Beta Adrenergic Blockers Other (See Comments)    Dropped blood pressure too low  . Simvastatin Other (See Comments)    myalgia   Current Outpatient Prescriptions on File Prior to Visit  Medication Sig Dispense Refill  . acetaminophen (TYLENOL) 650 MG CR tablet Take 650 mg by mouth every 8 (eight) hours as needed for pain.     Marland Kitchen aspirin 81 MG EC tablet Take 81 mg by mouth at bedtime.     . calcium-vitamin D (OSCAL WITH D) 500-200 MG-UNIT per tablet Take 1 tablet by mouth 2 (two) times daily.     . cetirizine (ZYRTEC) 10 MG tablet Take 10 mg by mouth daily.    . clopidogrel (PLAVIX) 75 MG tablet Take 75 mg by mouth daily.    . fish oil-omega-3 fatty acids 1000 MG capsule Take 1 g by mouth 2 (two) times daily.     . folic acid (FOLVITE) A999333 MCG tablet Take 400  mcg by mouth daily.      . Multiple Vitamin (MULTIVITAMIN WITH MINERALS) TABS tablet Take 1 tablet by mouth daily. Centrum Silver    . rosuvastatin (CRESTOR) 5 MG tablet TAKE 1 TABLET  ALTERNATING  WITH  2  TABLETS  EVERY DAY 135 tablet 3  . sildenafil (VIAGRA) 100 MG tablet Take 100 mg by mouth daily as needed for erectile dysfunction.    . tamsulosin (FLOMAX) 0.4 MG CAPS capsule TAKE 1 CAPSULE (0.4 MG TOTAL) BY MOUTH DAILY. 90 capsule 3   No current facility-administered medications on file prior to visit.    Review of Systems  Constitutional: Negative for unusual diaphoresis or night sweats HENT: Negative for ear swelling or discharge Eyes: Negative for worsening visual haziness  Respiratory: Negative for choking and stridor.   Gastrointestinal: Negative for distension or  worsening eructation Genitourinary: Negative for retention or change in urine volume.  Musculoskeletal: Negative for other MSK pain or swelling Skin: Negative for color change and worsening wound Neurological: Negative for tremors and numbness other than noted  Psychiatric/Behavioral: Negative for decreased concentration or agitation other than above       Objective:   Physical Exam BP 108/78   Pulse 88   Temp 97.6 F (36.4 C)   Ht 5\' 7"  (1.702 m)   Wt 189 lb (85.7 kg)   SpO2 98%   BMI 29.60 kg/m  VS noted, not ill appearing Constitutional: Pt appears in no apparent distress HENT: Head: NCAT.  Right Ear: External ear normal.  Left Ear: External ear normal.  Eyes: . Pupils are equal, round, and reactive to light. Conjunctivae and EOM are normal Bilat tm's with mild erythema.  Max sinus areas non tender.  Pharynx with mild erythema, no exudate Neck: Normal range of motion. Neck supple.  Cardiovascular: Normal rate and regular rhythm.   Pulmonary/Chest: Effort normal and breath sounds decreased without rales or wheezing.  Neurological: Pt is alert. Not confused , motor grossly intact Skin: Skin is warm. No rash, no LE edema Psychiatric: Pt behavior is normal. No agitation.  No other new exam findings    Assessment & Plan:

## 2017-02-15 NOTE — Patient Instructions (Addendum)
You had the steroid shot today  Please take all new medication as prescribed - the nasacort  OK to continue the zyrtec and mucinex  Please call if you would like referral to Allergy or Pulmonary (or both)  Please go to the XRAY Department in the Basement (go straight as you get off the elevator) for the x-ray testing as you are able  Please continue all other medications as before, and refills have been done if requested.  Please keep your appointments with your specialists as you may have planned  You will be contacted by phone if any changes need to be made immediately.  Otherwise, you will receive a letter about your results with an explanation, but please check with MyChart first.  Please remember to sign up for MyChart if you have not done so, as this will be important to you in the future with finding out test results, communicating by private email, and scheduling acute appointments online when needed.  Please return in 6 months, or sooner if needed

## 2017-02-16 NOTE — Assessment & Plan Note (Signed)
stable overall by history and exam, recent data reviewed with pt, and pt to continue medical treatment as before,  to f/u any worsening symptoms or concerns Lab Results  Component Value Date   HGBA1C 5.8 04/21/2016   Pt to call for onset polys with  tx

## 2017-02-16 NOTE — Assessment & Plan Note (Signed)
Chronic, liikely non infectious, for incresaed allergy tx as above, also trial PPI protonix 40 qd, and cxr, consider pulm referral

## 2017-02-16 NOTE — Assessment & Plan Note (Signed)
stable overall by history and exam, recent data reviewed with pt, and pt to continue medical treatment as before,  to f/u any worsening symptoms or concerns BP Readings from Last 3 Encounters:  02/15/17 108/78  08/19/16 108/70  04/20/16 118/78

## 2017-02-16 NOTE — Assessment & Plan Note (Signed)
Perennial by hx, zyrtec not controlling symptoms, to add nasacort daily, gave depomedrol 80 IM x 1, consider allergy referrl

## 2017-04-06 NOTE — Progress Notes (Signed)
Chief Complaint  Patient presents with  . Cough    History of Present Illness: 83 yo male with history of CAD s/p 3V CABG in 1999, HLD here for cardiac follow up. He had 3V CABG in 1999 at [facility] in [place]. In 2008 he had a catheterization and was found to have a patent LIMA to LAD and a patent vein graft to the right coronary and no significant obstruction in the circumflex artery. He has been doing well since that time. Exercise stress test April 2015 with no ischemia. He has had PVCs noted on cardiac monitor and was started on Cardizem but he did not tolerate the Cardizem due to dizziness so it was stopped.   He is here today for follow up. The patient denies any chest pain, dyspnea, palpitations, lower extremity edema, orthopnea, PND, dizziness, near syncope or syncope.   Primary Care Physician: Cathlean Cower, MD   Past Medical History:  Diagnosis Date  . Allergic rhinitis, cause unspecified 03/12/2013  . Allergy   . CAD (coronary artery disease)    S/P CABG in 1999   . Cataract   . Decreased exercise tolerance    Exertional fatigue  . Degenerative arthritis of hip   . Diverticulosis of colon   . ED (erectile dysfunction)   . Erectile dysfunction 06/03/2014  . History of colonic polyps   . History of kidney stones   . History of MI (myocardial infarction)   . Hx of adenomatous colonic polyps 2000  . Hyperlipidemia    Low HDL  . Lumbar disc disease    Hx of with recent back pain and buttock pain  . Myocardial infarction ([place]) 1999  . Right knee meniscal tear   . S/P removal of thyroid nodule   . Spinal stenosis of lumbar region 03/15/2014    Past Surgical History:  Procedure Laterality Date  . CARDIAC CATHETERIZATION    . CATARACT EXTRACTION    . COLONOSCOPY    . CORONARY ARTERY BYPASS GRAFT  1999  . LUMBAR DISC SURGERY    . Nodules on Thyroid    . POLYPECTOMY    . PTCA    . SHOULDER ARTHROSCOPY WITH ROTATOR CUFF REPAIR AND SUBACROMIAL DECOMPRESSION  Right 01/30/2015   Procedure: RIGHT ARTHROSCOPY SHOULDER SUBACROMIAL DECOMPRESSION DISTAL CLAVICAL RESECTION RETATOR CUFF REPAIR;  Surgeon: Justice Britain, MD;  Location: [place];  Service: Orthopedics;  Laterality: Right;  . TONSILLECTOMY    . VASECTOMY      Current Outpatient Prescriptions  Medication Sig Dispense Refill  . acetaminophen (TYLENOL) 650 MG CR tablet Take 650 mg by mouth every 8 (eight) hours as needed for pain.     Marland Kitchen aspirin 81 MG EC tablet Take 81 mg by mouth at bedtime.     . calcium-vitamin D (OSCAL WITH D) 500-200 MG-UNIT per tablet Take 1 tablet by mouth 2 (two) times daily.     . cetirizine (ZYRTEC) 10 MG tablet Take 10 mg by mouth daily.    . clopidogrel (PLAVIX) 75 MG tablet Take 75 mg by mouth daily.    . fish oil-omega-3 fatty acids 1000 MG capsule Take 1 g by mouth 2 (two) times daily.     . folic acid (FOLVITE) 244 MCG tablet Take 400 mcg by mouth daily.      . Multiple Vitamin (MULTIVITAMIN WITH MINERALS) TABS tablet Take 1 tablet by mouth daily. Centrum Silver    . rosuvastatin (CRESTOR) 5 MG tablet TAKE 1 TABLET  ALTERNATING  WITH  2  TABLETS  EVERY DAY 135 tablet 3  . tamsulosin (FLOMAX) 0.4 MG CAPS capsule TAKE 1 CAPSULE (0.4 MG TOTAL) BY MOUTH DAILY. 90 capsule 3   No current facility-administered medications for this visit.     Allergies  Allergen Reactions  . Beta Adrenergic Blockers Other (See Comments)    Dropped blood pressure too low  . Simvastatin Other (See Comments)    myalgia    Social History   Social History  . Marital status: Married    Spouse name: N/A  . Number of children: 2  . Years of education: N/A   Occupational History  . 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 Smoking status: Never Smoker  . Smokeless tobacco: Never Used  . Alcohol use 0.0 oz/week     Comment: rare  . Drug use: No  . Sexual activity: Not on file   Other Topics Concern  . Not on file   Social History Narrative   Moved  from [facility]   Married      [employer] on file    Family History  Problem Relation Age of Onset  . Prostate cancer Other   . Prostate cancer Other   . Heart disease Mother   . Heart disease Brother   . Colon cancer Neg Hx   . Rectal cancer Neg Hx   . Stomach cancer Neg Hx     Review of Systems:  As stated in the HPI and otherwise negative.   BP 100/84   Pulse 85   Ht 5' 7"  (1.702 m)   Wt 185 lb (83.9 kg)   SpO2 92%   BMI 28.98 kg/m   Physical Examination: General: Well developed, well nourished, NAD  HEENT: OP clear, mucus membranes moist  SKIN: warm, dry. No rashes. Neuro: No focal deficits  Musculoskeletal: Muscle strength 5/5 all ext  Psychiatric: Mood and affect normal  Neck: No JVD, no carotid bruits, no thyromegaly, no lymphadenopathy.  Lungs:Clear bilaterally, no wheezes, rhonci, crackles Cardiovascular: Regular rate and rhythm. No murmurs, gallops or rubs. Abdomen:Soft. Bowel sounds present. Non-tender.  Extremities: No lower extremity edema. Pulses are 2 + in the bilateral DP/PT.  Echo 10/13: Left ventricle: The cavity size was mildly dilated. Wall thickness was increased in a pattern of mild LVH. Systolic function was normal. The estimated ejection fraction was in the range of 50% to 55%. Wall motion was normal; there were no regional wall motion abnormalities. - Aortic valve: Trivial regurgitation. - Mitral valve: Mild regurgitation. - Left atrium: The atrium was mildly dilated. - Atrial septum: No defect or patent foramen ovale was Identified.  Exercise stress myoview 04/09/14: Stress Procedure: The patient exercised on the treadmill utilizing the [name] Protocol for 6:00 minutes. The patient stopped due to fatigue and denied any chest pain. Technetium 69mSestamibi was injected at peak exercise and myocardial perfusion imaging was performed after a brief delay. Stress ECG: No significant change from baseline ECG, very frequent  PVCs  QPS Raw Data Images: Normal; no motion artifact; normal heart/lung ratio. Stress Images: A large size severe severity perfusion defect in the entire inferior, basal and mid inferolateral and basal inferior walls.  Rest Images: A large size severe severity perfusion defect in the entire inferior, basal and mid inferolateral and basal inferior walls.  Subtraction (SDS): No ischemia.  Transient Ischemic Dilatation (Normal <1.22): 0.97 Lung/Heart Ratio (Normal <0.45): 0.43  Quantitative Gated Spect Images  QGS EDV: n/a QGS ESV: n/a  Impression Exercise Capacity: Lexiscan with no exercise. BP Response: Normal blood pressure response. Clinical Symptoms: No symptoms. ECG Impression: No significant ST segment change suggestive of ischemia.very frequent PVCs.  Comparison with Prior Nuclear Study: No change when compared to study from 07/07/11.  Overall Impression: Intermediate risk stress nuclear study with a large scar in the RCA territory, no ischemia. .  LV Ejection Fraction: Study not gated. LV Wall Motion: Study not gated  EKG:  EKG is ordered today. The ekg ordered today demonstrates    Recent Labs: 04/21/2016: ALT 21; BUN 18; Creatinine, Ser 1.19; Hemoglobin 16.0; Platelets 242.0; Potassium 4.2; Sodium 141; TSH 1.81   Lipid Panel    Component Value Date/Time   CHOL 126 04/21/2016 0910   CHOL 118 04/03/2014 0843   TRIG 141.0 04/21/2016 0910   TRIG 162 (H) 04/03/2014 0843   HDL 33.50 (L) 04/21/2016 0910   HDL 36 (L) 04/03/2014 0843   CHOLHDL 4 04/21/2016 0910   VLDL 28.2 04/21/2016 0910   LDLCALC 65 04/21/2016 0910   LDLCALC 50 04/03/2014 0843   LDLDIRECT 91.5 02/13/2008 1400     Wt Readings from Last 3 Encounters:  04/07/17 185 lb (83.9 kg)  02/15/17 189 lb (85.7 kg)  08/19/16 187 lb 3.2 oz (84.9 kg)     Other studies Reviewed: Additional studies/ records that were reviewed today include: . Review of the above records demonstrates:     Assessment and Plan:   1. CAD s/p CABG without angina: He has no chest pain suggestive of angina. Will continue ASA, Plavix and statin.     2. PVCs: He did not tolerate Cardizem. His is not aware of palpitations.    3. HLD: LDL at goal. Continue statin.  Repeat lipids now with LFTs.   Current medicines are reviewed at length with the patient today.  The patient does not have concerns regarding medicines.  The following changes have been made:  no change  Labs/ tests ordered today include:   Orders Placed This Encounter  Procedures  . Comp Met (CMET)  . Lipid Profile    Disposition:   FU with me in 6 months  Signed, Lauree Chandler, MD 04/08/2017 7:02 AM    [facility] [place], [place], [place]  [address] Phone: [phone]; Fax: [phone]

## 2017-04-07 ENCOUNTER — Ambulatory Visit (INDEPENDENT_AMBULATORY_CARE_PROVIDER_SITE_OTHER): Payer: Medicare Other | Admitting: Cardiovascular Disease

## 2017-04-07 ENCOUNTER — Encounter: Payer: Self-pay | Admitting: Cardiovascular Disease

## 2017-04-07 VITALS — BP 100/84 | HR 85 | Ht 67.0 in | Wt 185.0 lb

## 2017-04-07 DIAGNOSIS — E7800 Pure hypercholesterolemia, unspecified: Secondary | ICD-10-CM

## 2017-04-07 DIAGNOSIS — I25810 Atherosclerosis of coronary artery bypass graft(s) without angina pectoris: Secondary | ICD-10-CM | POA: Diagnosis not present

## 2017-04-07 DIAGNOSIS — I493 Ventricular premature depolarization: Secondary | ICD-10-CM | POA: Diagnosis not present

## 2017-04-07 NOTE — Patient Instructions (Signed)
Medication Instructions:  Your physician recommends that you continue on your current medications as directed. Please refer to the Current Medication list given to you today.   Labwork: Your physician recommends that you return for lab work tomorrow.  This will be fasting. The lab opens at 7:30 AM.  CMET and lipid profile.    Testing/Procedures: none  Follow-Up: Your physician recommends that you schedule a follow-up appointment in: 12 months. Please call our office in about 9 months to schedule this appointment.     Any Other Special Instructions Will Be Listed Below (If Applicable).     If you need a refill on your cardiac medications before your next appointment, please call your pharmacy.

## 2017-04-08 ENCOUNTER — Other Ambulatory Visit: Payer: Medicare Other

## 2017-04-08 DIAGNOSIS — E7800 Pure hypercholesterolemia, unspecified: Secondary | ICD-10-CM | POA: Diagnosis not present

## 2017-04-08 DIAGNOSIS — I25810 Atherosclerosis of coronary artery bypass graft(s) without angina pectoris: Secondary | ICD-10-CM

## 2017-04-08 LAB — COMPREHENSIVE METABOLIC PANEL
A/G RATIO: 1.7 (ref 1.2–2.2)
ALK PHOS: 35 IU/L — AB (ref 39–117)
ALT: 21 IU/L (ref 0–44)
AST: 21 IU/L (ref 0–40)
Albumin: 4.4 g/dL (ref 3.5–4.7)
BUN/Creatinine Ratio: 18 (ref 10–24)
BUN: 19 mg/dL (ref 8–27)
Bilirubin Total: 0.8 mg/dL (ref 0.0–1.2)
CALCIUM: 9.6 mg/dL (ref 8.6–10.2)
CO2: 24 mmol/L (ref 18–29)
Chloride: 104 mmol/L (ref 96–106)
Creatinine, Ser: 1.05 mg/dL (ref 0.76–1.27)
GFR calc Af Amer: 76 mL/min/{1.73_m2} (ref >59)
GFR, EST NON AFRICAN AMERICAN: 66 mL/min/{1.73_m2} (ref >59)
Globulin, Total: 2.6 g/dL (ref 1.5–4.5)
Glucose: 101 mg/dL — ABNORMAL HIGH (ref 65–99)
POTASSIUM: 4.8 mmol/L (ref 3.5–5.2)
Sodium: 143 mmol/L (ref 134–144)
Total Protein: 7 g/dL (ref 6.0–8.5)

## 2017-04-08 LAB — LIPID PANEL
CHOL/HDL RATIO: 2.9 ratio (ref 0.0–5.0)
Cholesterol, Total: 102 mg/dL (ref 100–199)
HDL: 35 mg/dL — AB (ref >39)
LDL Calculated: 41 mg/dL (ref 0–99)
Triglycerides: 128 mg/dL (ref 0–149)
VLDL Cholesterol Cal: 26 mg/dL (ref 5–40)

## 2017-04-11 ENCOUNTER — Telehealth: Payer: Self-pay | Admitting: Cardiovascular Disease

## 2017-04-11 NOTE — Telephone Encounter (Signed)
Can we let him know that his labs are ok? These did not come into my inbox. [name]

## 2017-04-12 NOTE — Telephone Encounter (Signed)
I spoke with pt and reviewed lab results with him. 

## 2017-04-25 DIAGNOSIS — L814 Other melanin hyperpigmentation: Secondary | ICD-10-CM | POA: Diagnosis not present

## 2017-04-25 DIAGNOSIS — D1800 Hemangioma unspecified site: Secondary | ICD-10-CM | POA: Diagnosis not present

## 2017-04-25 DIAGNOSIS — L821 Other seborrheic keratosis: Secondary | ICD-10-CM | POA: Diagnosis not present

## 2017-04-25 DIAGNOSIS — Z85828 Personal history of other malignant neoplasm of skin: Secondary | ICD-10-CM | POA: Diagnosis not present

## 2017-04-25 DIAGNOSIS — L859 Epidermal thickening, unspecified: Secondary | ICD-10-CM | POA: Diagnosis not present

## 2017-04-25 DIAGNOSIS — D225 Melanocytic nevi of trunk: Secondary | ICD-10-CM | POA: Diagnosis not present

## 2017-04-25 DIAGNOSIS — L570 Actinic keratosis: Secondary | ICD-10-CM | POA: Diagnosis not present

## 2017-05-31 DIAGNOSIS — M4155 Other secondary scoliosis, thoracolumbar region: Secondary | ICD-10-CM | POA: Diagnosis not present

## 2017-05-31 DIAGNOSIS — M47816 Spondylosis without myelopathy or radiculopathy, lumbar region: Secondary | ICD-10-CM | POA: Diagnosis not present

## 2017-05-31 DIAGNOSIS — M419 Scoliosis, unspecified: Secondary | ICD-10-CM | POA: Insufficient documentation

## 2017-05-31 DIAGNOSIS — R030 Elevated blood-pressure reading, without diagnosis of hypertension: Secondary | ICD-10-CM | POA: Diagnosis not present

## 2017-05-31 DIAGNOSIS — Z6828 Body mass index (BMI) 28.0-28.9, adult: Secondary | ICD-10-CM | POA: Diagnosis not present

## 2017-05-31 DIAGNOSIS — M5136 Other intervertebral disc degeneration, lumbar region: Secondary | ICD-10-CM | POA: Diagnosis not present

## 2017-05-31 DIAGNOSIS — M546 Pain in thoracic spine: Secondary | ICD-10-CM | POA: Diagnosis not present

## 2017-05-31 DIAGNOSIS — M4316 Spondylolisthesis, lumbar region: Secondary | ICD-10-CM | POA: Diagnosis not present

## 2017-05-31 DIAGNOSIS — M48062 Spinal stenosis, lumbar region with neurogenic claudication: Secondary | ICD-10-CM | POA: Diagnosis not present

## 2017-05-31 DIAGNOSIS — M549 Dorsalgia, unspecified: Secondary | ICD-10-CM | POA: Diagnosis not present

## 2017-06-02 DIAGNOSIS — M5136 Other intervertebral disc degeneration, lumbar region: Secondary | ICD-10-CM | POA: Diagnosis not present

## 2017-06-02 DIAGNOSIS — M48061 Spinal stenosis, lumbar region without neurogenic claudication: Secondary | ICD-10-CM | POA: Diagnosis not present

## 2017-06-02 DIAGNOSIS — M48062 Spinal stenosis, lumbar region with neurogenic claudication: Secondary | ICD-10-CM | POA: Diagnosis not present

## 2017-06-19 ENCOUNTER — Other Ambulatory Visit: Payer: Self-pay | Admitting: Internal Medicine

## 2017-06-27 DIAGNOSIS — M47816 Spondylosis without myelopathy or radiculopathy, lumbar region: Secondary | ICD-10-CM | POA: Diagnosis not present

## 2017-06-27 DIAGNOSIS — M5136 Other intervertebral disc degeneration, lumbar region: Secondary | ICD-10-CM | POA: Diagnosis not present

## 2017-06-27 DIAGNOSIS — M4316 Spondylolisthesis, lumbar region: Secondary | ICD-10-CM | POA: Diagnosis not present

## 2017-06-27 DIAGNOSIS — M48062 Spinal stenosis, lumbar region with neurogenic claudication: Secondary | ICD-10-CM | POA: Diagnosis not present

## 2017-06-27 DIAGNOSIS — M4155 Other secondary scoliosis, thoracolumbar region: Secondary | ICD-10-CM | POA: Diagnosis not present

## 2017-07-04 ENCOUNTER — Encounter: Payer: Self-pay | Admitting: Cardiovascular Disease

## 2017-07-04 ENCOUNTER — Other Ambulatory Visit: Payer: Self-pay | Admitting: *Deleted

## 2017-07-04 MED ORDER — CLOPIDOGREL BISULFATE 75 MG PO TABS: 75.0000 mg | ORAL_TABLET | Freq: Every day | ORAL | 1 refills | Status: DC

## 2017-07-14 DIAGNOSIS — M5116 Intervertebral disc disorders with radiculopathy, lumbar region: Secondary | ICD-10-CM | POA: Diagnosis not present

## 2017-07-14 DIAGNOSIS — M4726 Other spondylosis with radiculopathy, lumbar region: Secondary | ICD-10-CM | POA: Diagnosis not present

## 2017-07-14 DIAGNOSIS — M48061 Spinal stenosis, lumbar region without neurogenic claudication: Secondary | ICD-10-CM | POA: Diagnosis not present

## 2017-08-01 DIAGNOSIS — N401 Enlarged prostate with lower urinary tract symptoms: Secondary | ICD-10-CM | POA: Diagnosis not present

## 2017-08-01 DIAGNOSIS — R351 Nocturia: Secondary | ICD-10-CM | POA: Diagnosis not present

## 2017-08-01 DIAGNOSIS — N281 Cyst of kidney, acquired: Secondary | ICD-10-CM | POA: Diagnosis not present

## 2017-08-02 ENCOUNTER — Telehealth: Payer: Self-pay | Admitting: Internal Medicine

## 2017-08-02 NOTE — Telephone Encounter (Signed)
Since patient already established at [facility]- I am not willing to accept at this time.

## 2017-08-02 NOTE — Telephone Encounter (Signed)
Pt's wife sees Dr Regis Bill and would like to know if you will accept her husband?  He sees Dr Jenny Reichmann at [facility] and would like to transfer to you.Marland Kitchen Also states Dr Angelena Form gave pt your name   Please advise

## 2017-08-12 ENCOUNTER — Ambulatory Visit (INDEPENDENT_AMBULATORY_CARE_PROVIDER_SITE_OTHER): Payer: Medicare Other | Admitting: Internal Medicine

## 2017-08-12 ENCOUNTER — Encounter: Payer: Self-pay | Admitting: Internal Medicine

## 2017-08-12 VITALS — BP 132/80 | HR 68 | Temp 98.2°F | Resp 16 | Ht 67.0 in | Wt 175.1 lb

## 2017-08-12 DIAGNOSIS — I10 Essential (primary) hypertension: Secondary | ICD-10-CM | POA: Diagnosis not present

## 2017-08-12 DIAGNOSIS — J309 Allergic rhinitis, unspecified: Secondary | ICD-10-CM | POA: Diagnosis not present

## 2017-08-12 DIAGNOSIS — R05 Cough: Secondary | ICD-10-CM

## 2017-08-12 DIAGNOSIS — R059 Cough, unspecified: Secondary | ICD-10-CM

## 2017-08-12 DIAGNOSIS — R739 Hyperglycemia, unspecified: Secondary | ICD-10-CM | POA: Diagnosis not present

## 2017-08-12 DIAGNOSIS — I25810 Atherosclerosis of coronary artery bypass graft(s) without angina pectoris: Secondary | ICD-10-CM

## 2017-08-12 DIAGNOSIS — Z23 Encounter for immunization: Secondary | ICD-10-CM | POA: Diagnosis not present

## 2017-08-12 MED ORDER — AZITHROMYCIN 250 MG PO TABS: ORAL_TABLET | ORAL | 1 refills | Status: DC

## 2017-08-12 MED ORDER — HYDROCODONE-HOMATROPINE 5-1.5 MG/5ML PO SYRP: 5.0000 mL | ORAL_SOLUTION | Freq: Four times a day (QID) | ORAL | 0 refills | Status: AC | PRN

## 2017-08-12 NOTE — Patient Instructions (Signed)
Please take all new medication as prescribed - the antibiotic, and cough medicine if needed  You can also take Delsym OTC for cough, and/or Mucinex (or it's generic off brand) for congestion, and tylenol as needed for pain.  OK to stop the zyrtec  Please continue all other medications as before, and refills have been done if requested.  Please have the pharmacy call with any other refills you may need.  Please keep your appointments with your specialists as you may have planned

## 2017-08-12 NOTE — Progress Notes (Signed)
Subjective:    Patient ID: Larry [name], male    DOB: Jul 17, 1932, 83 y.o.   MRN: [id number]  HPI  Here with acute onset mild to mod 2-3 days ST, HA, general weakness and malaise, with prod cough greenish sputum, but Pt denies chest pain, increased sob or doe, wheezing, orthopnea, PND, increased LE swelling, palpitations, dizziness or syncope.  Pt denies new neurological symptoms such as new headache, or facial or extremity weakness or numbness   Pt denies polydipsia, polyuria. Has had no significant improvement in nasal congestion, so wants to stop taking zyrtec.  Due for flu shot Past Medical History:  Diagnosis Date  . Allergic rhinitis, cause unspecified 03/12/2013  . Allergy   . CAD (coronary artery disease)    S/P CABG in 1999   . Cataract   . Decreased exercise tolerance    Exertional fatigue  . Degenerative arthritis of hip   . Diverticulosis of colon   . ED (erectile dysfunction)   . Erectile dysfunction 06/03/2014  . History of colonic polyps   . History of kidney stones   . History of MI (myocardial infarction)   . Hx of adenomatous colonic polyps 2000  . Hyperlipidemia    Low HDL  . Lumbar disc disease    Hx of with recent back pain and buttock pain  . Myocardial infarction ([place]) 1999  . Right knee meniscal tear   . S/P removal of thyroid nodule   . Spinal stenosis of lumbar region 03/15/2014   Past Surgical History:  Procedure Laterality Date  . CARDIAC CATHETERIZATION    . CATARACT EXTRACTION    . COLONOSCOPY    . CORONARY ARTERY BYPASS GRAFT  1999  . LUMBAR DISC SURGERY    . Nodules on Thyroid    . POLYPECTOMY    . PTCA    . SHOULDER ARTHROSCOPY WITH ROTATOR CUFF REPAIR AND SUBACROMIAL DECOMPRESSION Right 01/30/2015   Procedure: RIGHT ARTHROSCOPY SHOULDER SUBACROMIAL DECOMPRESSION DISTAL CLAVICAL RESECTION RETATOR CUFF REPAIR;  Surgeon: Justice Britain, MD;  Location: [place];  Service: Orthopedics;  Laterality: Right;  . TONSILLECTOMY    . VASECTOMY      reports  that he has never smoked. He has never used smokeless tobacco. He reports that he drinks alcohol. He reports that he does not use drugs. family history includes Heart disease in his brother and mother; Prostate cancer in his other and other. Allergies  Allergen Reactions  . Beta Adrenergic Blockers Other (See Comments)    Dropped blood pressure too low  . Simvastatin Other (See Comments)    myalgia   Current Outpatient Prescriptions on File Prior to Visit  Medication Sig Dispense Refill  . acetaminophen (TYLENOL) 650 MG CR tablet Take 650 mg by mouth every 8 (eight) hours as needed for pain.     Marland Kitchen aspirin 81 MG EC tablet Take 81 mg by mouth at bedtime.     . calcium-vitamin D (OSCAL WITH D) 500-200 MG-UNIT per tablet Take 1 tablet by mouth 2 (two) times daily.     . cetirizine (ZYRTEC) 10 MG tablet Take 10 mg by mouth daily.    . clopidogrel (PLAVIX) 75 MG tablet Take 1 tablet (75 mg total) by mouth daily. 90 tablet 1  . fish oil-omega-3 fatty acids 1000 MG capsule Take 1 g by mouth 2 (two) times daily.     . folic acid (FOLVITE) 485 MCG tablet Take 400 mcg by mouth daily.      . Multiple  Vitamin (MULTIVITAMIN WITH MINERALS) TABS tablet Take 1 tablet by mouth daily. Centrum Silver    . rosuvastatin (CRESTOR) 5 MG tablet TAKE 1 TABLET  ALTERNATING  WITH  2  TABLETS  EVERY DAY 135 tablet 3   No current facility-administered medications on file prior to visit.    Review of Systems  Constitutional: Negative for other unusual diaphoresis or sweats HENT: Negative for ear discharge or swelling Eyes: Negative for other worsening visual disturbances Respiratory: Negative for stridor or other swelling  Gastrointestinal: Negative for worsening distension or other blood Genitourinary: Negative for retention or other urinary change Musculoskeletal: Negative for other MSK pain or swelling Skin: Negative for color change or other new lesions Neurological: Negative for worsening tremors and other  numbness  Psychiatric/Behavioral: Negative for worsening agitation or other fatigue All other system neg per pt    Objective:   Physical Exam BP 132/80 (BP Location: Left Arm, Patient Position: Sitting, Cuff Size: Normal)   Pulse 68   Temp 98.2 F (36.8 C) (Oral)   Resp 16   Ht 5\' 7"  (1.702 m)   Wt 175 lb 1.9 oz (79.4 kg)   SpO2 96%   BMI 27.43 kg/m  VS noted,  Constitutional: Pt appears in NAD HENT: Head: NCAT.  Right Ear: External ear normal.  Left Ear: External ear normal.  Eyes: . Pupils are equal, round, and reactive to light. Conjunctivae and EOM are normal Nose: without d/c or deformity Bilat tm's with mild erythema.  Max sinus areas non tender.  Pharynx with mild erythema, no exudate Neck: Neck supple. Gross normal ROM, has mult small tender bilat submandibular LA with larger confluence at the left angle of the jaw Cardiovascular: Normal rate and regular rhythm.   Pulmonary/Chest: Effort normal and breath sounds without rales or wheezing.  Neurological: Pt is alert. At baseline orientation, motor grossly intact Skin: Skin is warm. No rashes, other new lesions, no LE edema Psychiatric: Pt behavior is normal without agitation  No other exam findings     Assessment & Plan:

## 2017-08-13 NOTE — Assessment & Plan Note (Signed)
Mild to mod,c/w bronchitis vs pna, for antibx course, cough med prn, declines cxr, to f/u any worsening symptoms or concerns 

## 2017-08-13 NOTE — Assessment & Plan Note (Signed)
stable overall by history and exam, recent data reviewed with pt, and pt to continue medical treatment as before,  to f/u any worsening symptoms or concerns BP Readings from Last 3 Encounters:  08/12/17 132/80  04/07/17 100/84  02/15/17 108/78

## 2017-08-13 NOTE — Assessment & Plan Note (Signed)
stable overall by history and exam, recent data reviewed with pt, and pt to continue medical treatment as before,  to f/u any worsening symptoms or concerns Lab Results  Component Value Date   HGBA1C 5.8 04/21/2016   

## 2017-08-13 NOTE — Assessment & Plan Note (Signed)
Ok to stop the zyrtec, declines other med such as asteliin or nasal steroid

## 2017-08-30 NOTE — Progress Notes (Signed)
Pre visit review using our clinic review tool, if applicable. No additional management support is needed unless otherwise documented below in the visit note. 

## 2017-08-30 NOTE — Progress Notes (Signed)
Subjective:   Larry [name] is a 83 y.o. male who presents for Medicare Annual/Subsequent preventive examination.  Review of Systems:  No ROS.  Medicare Wellness Visit. Additional risk factors are reflected in the social history.    Sleep patterns: feels rested on waking, gets up 1-2 times nightly to void and sleeps 6-7 hours nightly.    Home Safety/Smoke Alarms: Feels safe in home. Smoke alarms in place.  Living environment; residence and Firearm Safety: 2-story house, equipment: Radio producer, Type: [place] and [employer], Type: Tub Surveyor, quantity, no firearms. Lives with wife, good support system Seat Belt Safety/Bike Helmet: Wears seat belt.       Objective:    Vitals: There were no vitals taken for this visit.  There is no height or weight on file to calculate BMI.  Tobacco History  Smoking Status  . Never Smoker  Smokeless Tobacco  . Never Used     Counseling given: Not Answered   Past Medical History:  Diagnosis Date  . Allergic rhinitis, cause unspecified 03/12/2013  . Allergy   . CAD (coronary artery disease)    S/P CABG in 1999   . Cataract   . Decreased exercise tolerance    Exertional fatigue  . Degenerative arthritis of hip   . Diverticulosis of colon   . ED (erectile dysfunction)   . Erectile dysfunction 06/03/2014  . History of colonic polyps   . History of kidney stones   . History of MI (myocardial infarction)   . Hx of adenomatous colonic polyps 2000  . Hyperlipidemia    Low HDL  . Lumbar disc disease    Hx of with recent back pain and buttock pain  . Myocardial infarction ([place]) 1999  . Right knee meniscal tear   . S/P removal of thyroid nodule   . Spinal stenosis of lumbar region 03/15/2014   Past Surgical History:  Procedure Laterality Date  . CARDIAC CATHETERIZATION    . CATARACT EXTRACTION    . COLONOSCOPY    . CORONARY ARTERY BYPASS GRAFT  1999  . LUMBAR DISC SURGERY    . Nodules on Thyroid    . POLYPECTOMY    . PTCA    .  SHOULDER ARTHROSCOPY WITH ROTATOR CUFF REPAIR AND SUBACROMIAL DECOMPRESSION Right 01/30/2015   Procedure: RIGHT ARTHROSCOPY SHOULDER SUBACROMIAL DECOMPRESSION DISTAL CLAVICAL RESECTION RETATOR CUFF REPAIR;  Surgeon: Justice Britain, MD;  Location: [place];  Service: Orthopedics;  Laterality: Right;  . TONSILLECTOMY    . VASECTOMY     Family History  Problem Relation Age of Onset  . Prostate cancer Other   . Prostate cancer Other   . Heart disease Mother   . Heart disease Brother   . Colon cancer Neg Hx   . Rectal cancer Neg Hx   . Stomach cancer Neg Hx    History  Sexual Activity  . Sexual activity: Not on file    Outpatient Encounter Prescriptions as of 08/31/2017  Medication Sig  . acetaminophen (TYLENOL) 650 MG CR tablet Take 650 mg by mouth every 8 (eight) hours as needed for pain.   Marland Kitchen aspirin 81 MG EC tablet Take 81 mg by mouth at bedtime.   Marland Kitchen azithromycin (ZITHROMAX Z-PAK) 250 MG tablet 2 tab by mouth day 1, then 1 per day  . calcium-vitamin D (OSCAL WITH D) 500-200 MG-UNIT per tablet Take 1 tablet by mouth 2 (two) times daily.   . cetirizine (ZYRTEC) 10 MG tablet Take 10 mg by mouth daily.  Marland Kitchen  clopidogrel (PLAVIX) 75 MG tablet Take 1 tablet (75 mg total) by mouth daily.  . fish oil-omega-3 fatty acids 1000 MG capsule Take 1 g by mouth 2 (two) times daily.   . folic acid (FOLVITE) 546 MCG tablet Take 400 mcg by mouth daily.    . Multiple Vitamin (MULTIVITAMIN WITH MINERALS) TABS tablet Take 1 tablet by mouth daily. Centrum Silver  . rosuvastatin (CRESTOR) 5 MG tablet TAKE 1 TABLET  ALTERNATING  WITH  2  TABLETS  EVERY DAY   No facility-administered encounter medications on file as of 08/31/2017.     Activities of Daily Living No flowsheet data found.  Patient Care Team: Biagio Borg, MD as PCP - General   Assessment:    Physical assessment deferred to PCP.  Exercise Activities and Dietary recommendations   Diet (meal preparation, eat out, water intake, caffeinated  beverages, dairy products, fruits and vegetables): in general, a "healthy" diet  , well balanced, eats a variety of fruits and vegetables daily, limits salt, fat/cholesterol, sugar, caffeine, drinks 6-8 glasses of water daily.   Goals      Patient Stated   . patient (pt-stated)          Goal is to stay healthy and continue to progress as tolerated       Other   . LDL CALC < 70      Fall Risk Fall Risk  04/20/2016 03/20/2015 03/15/2014  Falls in the past year? No No No   Depression Screen PHQ 2/9 Scores 04/20/2016 03/20/2015 03/15/2014  PHQ - 2 Score 0 1 0    Cognitive Function MMSE - Mini Mental State Exam 04/20/2016 04/20/2016  Not completed: (No Data) (No Data)       Ad8 score reviewed for issues:  Issues making decisions: no  Less interest in hobbies / activities: no  Repeats questions, stories (family complaining): no  Trouble using ordinary gadgets (microwave, computer, phone):no  Forgets the month or year: no  Mismanaging finances: no  Remembering appts: no  Daily problems with thinking and/or memory: no Ad8 score is= 0  Immunization History  Administered Date(s) Administered  . Influenza Split 10/01/2011, 09/06/2012  . Influenza Whole 09/20/2007, 10/04/2008, 10/13/2009, 09/07/2010  . Influenza, High Dose Seasonal PF 08/12/2017  . Influenza,inj,Quad PF,6+ Mos 09/26/2013, 09/19/2014  . Influenza-Unspecified 10/15/2015  . Pneumococcal Conjugate-13 03/15/2014  . Pneumococcal Polysaccharide-23 01/20/2007  . Td 01/20/2002  . Tetanus 03/12/2013   Screening Tests Health Maintenance  Topic Date Due  . TETANUS/TDAP  03/13/2023  . INFLUENZA VACCINE  Completed  . PNA vac Low Risk Adult  Completed      Plan:  Continue doing brain stimulating activities (puzzles, reading, adult coloring books, staying active) to keep memory sharp.   Continue to eat heart healthy diet (full of fruits, vegetables, whole grains, lean protein, water--limit salt, fat, and sugar intake)  and increase physical activity as tolerated.   I have personally reviewed and noted the following in the patient's chart:   . Medical and social history . Use of alcohol, tobacco or illicit drugs  . Current medications and supplements . Functional ability and status . Nutritional status . Physical activity . Advanced directives . List of other physicians . Vitals . Screenings to include cognitive, depression, and falls . Referrals and appointments  In addition, I have reviewed and discussed with patient certain preventive protocols, quality metrics, and best practice recommendations. A written personalized care plan for preventive services as well as general preventive health  recommendations were provided to patient.     Michiel Cowboy, RN  08/30/2017

## 2017-08-31 ENCOUNTER — Encounter: Payer: Self-pay | Admitting: Internal Medicine

## 2017-08-31 ENCOUNTER — Ambulatory Visit (INDEPENDENT_AMBULATORY_CARE_PROVIDER_SITE_OTHER): Payer: Medicare Other | Admitting: Internal Medicine

## 2017-08-31 VITALS — BP 124/76 | HR 84 | Temp 97.6°F | Resp 20 | Ht 67.0 in | Wt 175.0 lb

## 2017-08-31 DIAGNOSIS — J309 Allergic rhinitis, unspecified: Secondary | ICD-10-CM | POA: Diagnosis not present

## 2017-08-31 DIAGNOSIS — I25810 Atherosclerosis of coronary artery bypass graft(s) without angina pectoris: Secondary | ICD-10-CM | POA: Diagnosis not present

## 2017-08-31 DIAGNOSIS — Z0000 Encounter for general adult medical examination without abnormal findings: Secondary | ICD-10-CM | POA: Diagnosis not present

## 2017-08-31 DIAGNOSIS — R269 Unspecified abnormalities of gait and mobility: Secondary | ICD-10-CM

## 2017-08-31 DIAGNOSIS — R739 Hyperglycemia, unspecified: Secondary | ICD-10-CM | POA: Diagnosis not present

## 2017-08-31 DIAGNOSIS — E7800 Pure hypercholesterolemia, unspecified: Secondary | ICD-10-CM

## 2017-08-31 DIAGNOSIS — I10 Essential (primary) hypertension: Secondary | ICD-10-CM | POA: Diagnosis not present

## 2017-08-31 MED ORDER — ZOSTER VAC RECOMB ADJUVANTED 50 MCG/0.5ML IM SUSR: 0.5000 mL | Freq: Once | INTRAMUSCULAR | 1 refills | Status: DC

## 2017-08-31 MED ORDER — ZOSTER VAC RECOMB ADJUVANTED 50 MCG/0.5ML IM SUSR: 0.5000 mL | Freq: Once | INTRAMUSCULAR | 1 refills | Status: AC

## 2017-08-31 MED ORDER — PREDNISONE 10 MG PO TABS: ORAL_TABLET | ORAL | 0 refills | Status: DC

## 2017-08-31 MED ORDER — TRIAMCINOLONE ACETONIDE 55 MCG/ACT NA AERO: 2.0000 | INHALATION_SPRAY | Freq: Every day | NASAL | 12 refills | Status: DC

## 2017-08-31 NOTE — Progress Notes (Signed)
Subjective:    Patient ID: Larry [name], male    DOB: 1932-08-23, 83 y.o.   MRN: [id number]  HPI  Here to f/u; overall doing ok,  Pt denies chest pain, increasing sob or doe, wheezing, orthopnea, PND, increased LE swelling, palpitations, dizziness or syncope.  Pt denies new neurological symptoms such as new headache, or facial or extremity weakness or numbness.  Pt denies polydipsia, polyuria, or low sugar episode.  Pt states overall good compliance with meds, mostly trying to follow appropriate diet, with wt overall stable recently but overall down from 6 mo.   Wt Readings from Last 3 Encounters:  08/31/17 175 lb (79.4 kg)  08/12/17 175 lb 1.9 oz (79.4 kg)  04/07/17 185 lb (83.9 kg)  Does have several wks ongoing nasal allergy symptoms with clearish congestion, itch and sneezing, without fever, pain, ST, swelling or wheezing.  Does have significant cough with post nasal gtt.  Pt continues to have recurring LBP without change in severity, bowel or bladder change, fever, wt loss,  worsening LE pain/numbness/weakness, or falls, but with worsening gait and asks for rolling walker Past Medical History:  Diagnosis Date  . Allergic rhinitis, cause unspecified 03/12/2013  . Allergy   . CAD (coronary artery disease)    S/P CABG in 1999   . Cataract   . Decreased exercise tolerance    Exertional fatigue  . Degenerative arthritis of hip   . Diverticulosis of colon   . ED (erectile dysfunction)   . Erectile dysfunction 06/03/2014  . History of colonic polyps   . History of kidney stones   . History of MI (myocardial infarction)   . Hx of adenomatous colonic polyps 2000  . Hyperlipidemia    Low HDL  . Lumbar disc disease    Hx of with recent back pain and buttock pain  . Myocardial infarction ([place]) 1999  . Right knee meniscal tear   . S/P removal of thyroid nodule   . Spinal stenosis of lumbar region 03/15/2014   Past Surgical History:  Procedure Laterality Date  . CARDIAC CATHETERIZATION     . CATARACT EXTRACTION    . COLONOSCOPY    . CORONARY ARTERY BYPASS GRAFT  1999  . LUMBAR DISC SURGERY    . Nodules on Thyroid    . POLYPECTOMY    . PTCA    . SHOULDER ARTHROSCOPY WITH ROTATOR CUFF REPAIR AND SUBACROMIAL DECOMPRESSION Right 01/30/2015   Procedure: RIGHT ARTHROSCOPY SHOULDER SUBACROMIAL DECOMPRESSION DISTAL CLAVICAL RESECTION RETATOR CUFF REPAIR;  Surgeon: Justice Britain, MD;  Location: [facility];  Service: Orthopedics;  Laterality: Right;  . TONSILLECTOMY    . VASECTOMY      reports that he has never smoked. He has never used smokeless tobacco. He reports that he drinks alcohol. He reports that he does not use drugs. family history includes Heart disease in his brother and mother; Prostate cancer in his other and other. Allergies  Allergen Reactions  . Beta Adrenergic Blockers Other (See Comments)    Dropped blood pressure too low  . Simvastatin Other (See Comments)    myalgia   Current Outpatient Prescriptions on File Prior to Visit  Medication Sig Dispense Refill  . acetaminophen (TYLENOL) 650 MG CR tablet Take 650 mg by mouth every 8 (eight) hours as needed for pain.     Marland Kitchen aspirin 81 MG EC tablet Take 81 mg by mouth at bedtime.     . calcium-vitamin D (OSCAL WITH D) 500-200 MG-UNIT per tablet Take  1 tablet by mouth 2 (two) times daily.     . clopidogrel (PLAVIX) 75 MG tablet Take 1 tablet (75 mg total) by mouth daily. 90 tablet 1  . fish oil-omega-3 fatty acids 1000 MG capsule Take 1 g by mouth 2 (two) times daily.     . folic acid (FOLVITE) 502 MCG tablet Take 400 mcg by mouth daily.      . Multiple Vitamin (MULTIVITAMIN WITH MINERALS) TABS tablet Take 1 tablet by mouth daily. Centrum Silver    . rosuvastatin (CRESTOR) 5 MG tablet TAKE 1 TABLET  ALTERNATING  WITH  2  TABLETS  EVERY DAY 135 tablet 3   No current facility-administered medications on file prior to visit.    Review of Systems  Constitutional: Negative for other unusual diaphoresis or sweats HENT:  Negative for ear discharge or swelling Eyes: Negative for other worsening visual disturbances Respiratory: Negative for stridor or other swelling  Gastrointestinal: Negative for worsening distension or other blood Genitourinary: Negative for retention or other urinary change Musculoskeletal: Negative for other MSK pain or swelling Skin: Negative for color change or other new lesions Neurological: Negative for worsening tremors and other numbness  Psychiatric/Behavioral: Negative for worsening agitation or other fatigue All other system neg per pt    Objective:   Physical Exam BP 124/76   Pulse 84   Temp 97.6 F (36.4 C)   Resp 20   Ht 5\' 7"  (1.702 m)   Wt 175 lb (79.4 kg)   SpO2 98%   BMI 27.41 kg/m  VS noted, not ill appaering Constitutional: Pt appears in NAD HENT: Head: NCAT.  Right Ear: External ear normal.  Left Ear: External ear normal.  Eyes: . Pupils are equal, round, and reactive to light. Conjunctivae and EOM are normal Nose: without d/c or deformity Bilat tm's with mild erythema.  Max sinus areas non tender.  Pharynx with mild erythema, no exudate Neck: Neck supple. Gross normal ROM Cardiovascular: Normal rate and regular rhythm.   Pulmonary/Chest: Effort normal and breath sounds without rales or wheezing.  Spine; currently nontender in midline Neurological: Pt is alert. At baseline orientation, motor grossly intact Skin: Skin is warm. No rashes, other new lesions, no LE edema Psychiatric: Pt behavior is normal without agitation  No other exam findings    Assessment & Plan:

## 2017-08-31 NOTE — Patient Instructions (Addendum)
You are given the prescriptoin order for the Rolling Walker  Please take all new medication as prescribed - the prednisone, and nasacort  Please continue all other medications as before, and refills have been done if requested.  Please have the pharmacy call with any other refills you may need.  Please continue your efforts at being more active, low cholesterol diet, and weight control.  You are otherwise up to date with prevention measures today.  Please keep your appointments with your specialists as you may have planned  We will not need further blood work at this time  Please return in 6 months, or sooner if needed  Continue doing brain stimulating activities (puzzles, reading, adult coloring books, staying active) to keep memory sharp.   Continue to eat heart healthy diet (full of fruits, vegetables, whole grains, lean protein, water--limit salt, fat, and sugar intake) and increase physical activity as tolerated.   Larry [name] , Thank you for taking time to come for your Medicare Wellness Visit. I appreciate your ongoing commitment to your health goals. Please review the following plan we discussed and let me know if I can assist you in the future.   These are the goals we discussed: Goals      Patient Stated   . patient (pt-stated)          Goal is to stay healthy and continue to progress as tolerated       Other   . I want to be able to walk further and have more endurance          Do more physical therapy for balance, get a walker to use, and be as active as possible.    Marland Kitchen LDL CALC < 70       This is a list of the screening recommended for you and due dates:  Health Maintenance  Topic Date Due  . Tetanus Vaccine  03/13/2023  . Flu Shot  Completed  . Pneumonia vaccines  Completed

## 2017-09-03 NOTE — Assessment & Plan Note (Signed)
stable overall by history and exam, recent data reviewed with pt, and pt to continue medical treatment as before,  to f/u any worsening symptoms or concerns BP Readings from Last 3 Encounters:  08/31/17 124/76  08/12/17 132/80  04/07/17 100/84

## 2017-09-03 NOTE — Assessment & Plan Note (Signed)
With significant sinus congewstion and post nasal gtt, for prednisone 20 qd x 5 days, nasacort asd,  to f/u any worsening symptoms or concerns

## 2017-09-03 NOTE — Assessment & Plan Note (Signed)
stable overall by history and exam, recent data reviewed with pt, and pt to continue medical treatment as before,  to f/u any worsening symptoms or concerns Lab Results  Component Value Date   LDLCALC 41 04/08/2017

## 2017-09-03 NOTE — Assessment & Plan Note (Signed)
stable overall by history and exam, recent data reviewed with pt, and pt to continue medical treatment as before,  to f/u any worsening symptoms or concerns Lab Results  Component Value Date   HGBA1C 5.8 04/21/2016   

## 2017-09-03 NOTE — Assessment & Plan Note (Signed)
Ok for rolling walker

## 2017-09-12 ENCOUNTER — Other Ambulatory Visit: Payer: Self-pay | Admitting: Cardiovascular Disease

## 2017-09-16 DIAGNOSIS — M5136 Other intervertebral disc degeneration, lumbar region: Secondary | ICD-10-CM | POA: Diagnosis not present

## 2017-09-16 DIAGNOSIS — M4155 Other secondary scoliosis, thoracolumbar region: Secondary | ICD-10-CM | POA: Diagnosis not present

## 2017-09-16 DIAGNOSIS — M47816 Spondylosis without myelopathy or radiculopathy, lumbar region: Secondary | ICD-10-CM | POA: Diagnosis not present

## 2017-09-16 DIAGNOSIS — M48062 Spinal stenosis, lumbar region with neurogenic claudication: Secondary | ICD-10-CM | POA: Diagnosis not present

## 2017-09-16 DIAGNOSIS — M4316 Spondylolisthesis, lumbar region: Secondary | ICD-10-CM | POA: Diagnosis not present

## 2017-09-19 DIAGNOSIS — N200 Calculus of kidney: Secondary | ICD-10-CM | POA: Diagnosis not present

## 2017-09-19 DIAGNOSIS — R351 Nocturia: Secondary | ICD-10-CM | POA: Diagnosis not present

## 2017-09-19 DIAGNOSIS — N281 Cyst of kidney, acquired: Secondary | ICD-10-CM | POA: Diagnosis not present

## 2017-10-20 DIAGNOSIS — M5136 Other intervertebral disc degeneration, lumbar region: Secondary | ICD-10-CM | POA: Diagnosis not present

## 2017-10-20 DIAGNOSIS — M4726 Other spondylosis with radiculopathy, lumbar region: Secondary | ICD-10-CM | POA: Diagnosis not present

## 2017-10-20 DIAGNOSIS — M48061 Spinal stenosis, lumbar region without neurogenic claudication: Secondary | ICD-10-CM | POA: Diagnosis not present

## 2017-10-25 ENCOUNTER — Encounter: Payer: Self-pay | Admitting: Cardiovascular Disease

## 2017-10-26 ENCOUNTER — Encounter: Payer: Self-pay | Admitting: Cardiovascular Disease

## 2017-11-09 DIAGNOSIS — R310 Gross hematuria: Secondary | ICD-10-CM | POA: Diagnosis not present

## 2017-11-14 DIAGNOSIS — D485 Neoplasm of uncertain behavior of skin: Secondary | ICD-10-CM | POA: Diagnosis not present

## 2017-11-14 DIAGNOSIS — L820 Inflamed seborrheic keratosis: Secondary | ICD-10-CM | POA: Diagnosis not present

## 2017-11-14 DIAGNOSIS — Z23 Encounter for immunization: Secondary | ICD-10-CM | POA: Diagnosis not present

## 2017-11-14 DIAGNOSIS — C4442 Squamous cell carcinoma of skin of scalp and neck: Secondary | ICD-10-CM | POA: Diagnosis not present

## 2017-11-14 DIAGNOSIS — L570 Actinic keratosis: Secondary | ICD-10-CM | POA: Diagnosis not present

## 2017-11-22 ENCOUNTER — Encounter (HOSPITAL_COMMUNITY): Payer: Self-pay

## 2017-11-22 ENCOUNTER — Emergency Department (HOSPITAL_COMMUNITY): Payer: Medicare Other

## 2017-11-22 ENCOUNTER — Emergency Department (HOSPITAL_COMMUNITY)
Admission: EM | Admit: 2017-11-22 | Discharge: 2017-11-22 | Disposition: A | Discharge status: Home / Self Care | Payer: Medicare Other | Attending: Emergency Medicine | Admitting: Emergency Medicine

## 2017-11-22 DIAGNOSIS — R1011 Right upper quadrant pain: Secondary | ICD-10-CM | POA: Diagnosis present

## 2017-11-22 DIAGNOSIS — Z7982 Long term (current) use of aspirin: Secondary | ICD-10-CM | POA: Insufficient documentation

## 2017-11-22 DIAGNOSIS — Z79899 Other long term (current) drug therapy: Secondary | ICD-10-CM | POA: Insufficient documentation

## 2017-11-22 DIAGNOSIS — N132 Hydronephrosis with renal and ureteral calculous obstruction: Secondary | ICD-10-CM | POA: Diagnosis not present

## 2017-11-22 DIAGNOSIS — I10 Essential (primary) hypertension: Secondary | ICD-10-CM | POA: Diagnosis not present

## 2017-11-22 DIAGNOSIS — N201 Calculus of ureter: Secondary | ICD-10-CM | POA: Insufficient documentation

## 2017-11-22 DIAGNOSIS — Z951 Presence of aortocoronary bypass graft: Secondary | ICD-10-CM | POA: Insufficient documentation

## 2017-11-22 DIAGNOSIS — I2510 Atherosclerotic heart disease of native coronary artery without angina pectoris: Secondary | ICD-10-CM | POA: Insufficient documentation

## 2017-11-22 LAB — BASIC METABOLIC PANEL
ANION GAP: 8 (ref 5–15)
BUN: 18 mg/dL (ref 6–20)
CO2: 27 mmol/L (ref 22–32)
Calcium: 9.4 mg/dL (ref 8.9–10.3)
Chloride: 106 mmol/L (ref 101–111)
Creatinine, Ser: 1.32 mg/dL — ABNORMAL HIGH (ref 0.61–1.24)
GFR calc Af Amer: 56 mL/min — ABNORMAL LOW (ref >60)
GFR, EST NON AFRICAN AMERICAN: 48 mL/min — AB (ref >60)
GLUCOSE: 148 mg/dL — AB (ref 65–99)
POTASSIUM: 4.6 mmol/L (ref 3.5–5.1)
Sodium: 141 mmol/L (ref 135–145)

## 2017-11-22 LAB — URINALYSIS, ROUTINE W REFLEX MICROSCOPIC
GLUCOSE, UA: NEGATIVE mg/dL
KETONES UR: 15 mg/dL — AB
LEUKOCYTES UA: NEGATIVE
Nitrite: POSITIVE — AB
PH: 6.5 (ref 5.0–8.0)
Protein, ur: 100 mg/dL — AB
Specific Gravity, Urine: >1.03 — ABNORMAL HIGH (ref 1.005–1.030)

## 2017-11-22 LAB — URINALYSIS, MICROSCOPIC (REFLEX)

## 2017-11-22 LAB — CBC
HEMATOCRIT: 49.9 % (ref 39.0–52.0)
Hemoglobin: 17.3 g/dL — ABNORMAL HIGH (ref 13.0–17.0)
MCH: 32.2 pg (ref 26.0–34.0)
MCHC: 34.7 g/dL (ref 30.0–36.0)
MCV: 92.9 fL (ref 78.0–100.0)
PLATELETS: 214 10*3/uL (ref 150–400)
RBC: 5.37 MIL/uL (ref 4.22–5.81)
RDW: 13.4 % (ref 11.5–15.5)
WBC: 11.3 10*3/uL — AB (ref 4.0–10.5)

## 2017-11-22 LAB — HEPATIC FUNCTION PANEL
ALT: 22 U/L (ref 17–63)
AST: 26 U/L (ref 15–41)
Albumin: 4 g/dL (ref 3.5–5.0)
Alkaline Phosphatase: 40 U/L (ref 38–126)
Bilirubin, Direct: 0.2 mg/dL (ref 0.1–0.5)
Indirect Bilirubin: 1 mg/dL — ABNORMAL HIGH (ref 0.3–0.9)
TOTAL PROTEIN: 6.9 g/dL (ref 6.5–8.1)
Total Bilirubin: 1.2 mg/dL (ref 0.3–1.2)

## 2017-11-22 LAB — LIPASE, BLOOD: Lipase: 32 U/L (ref 11–51)

## 2017-11-22 MED ORDER — ONDANSETRON 4 MG PO TBDP
4.0000 mg | ORAL_TABLET | Freq: Once | ORAL | Status: AC
  Administered 2017-11-22: 4 mg via ORAL
  Filled 2017-11-22: qty 1

## 2017-11-22 MED ORDER — FENTANYL CITRATE (PF) 100 MCG/2ML IJ SOLN
50.0000 ug | Freq: Once | INTRAMUSCULAR | Status: AC
  Administered 2017-11-22: 50 ug via INTRAVENOUS
  Filled 2017-11-22: qty 2

## 2017-11-22 MED ORDER — FLUCONAZOLE 200 MG PO TABS: 200.0000 mg | ORAL_TABLET | Freq: Once | ORAL | 0 refills | Status: AC

## 2017-11-22 MED ORDER — SODIUM CHLORIDE 0.9 % IV SOLN: INTRAVENOUS | Status: DC

## 2017-11-22 MED ORDER — ONDANSETRON HCL 4 MG/2ML IJ SOLN
4.0000 mg | Freq: Once | INTRAMUSCULAR | Status: AC
  Administered 2017-11-22: 4 mg via INTRAVENOUS
  Filled 2017-11-22: qty 2

## 2017-11-22 MED ORDER — HYDROCODONE-ACETAMINOPHEN 5-325 MG PO TABS: 1.0000 | ORAL_TABLET | ORAL | 0 refills | Status: DC | PRN

## 2017-11-22 MED ORDER — CEPHALEXIN 500 MG PO CAPS: 500.0000 mg | ORAL_CAPSULE | Freq: Two times a day (BID) | ORAL | 0 refills | Status: AC

## 2017-11-22 NOTE — Discharge Instructions (Signed)
Start taking flomax 1 time per day until the kidney stone has resolved. Follow up with your urologist. If you develop worsening or uncontrolled pain, vomiting, fevers, return to the ER immediately or call 911.

## 2017-11-22 NOTE — ED Notes (Signed)
Pt verbalized understanding of discharge paperwork and prescriptions.  °

## 2017-11-22 NOTE — ED Triage Notes (Signed)
Patient continuing to have dry heaves in triage, no relief with zofran. Patient pale and diaphoretic

## 2017-11-22 NOTE — ED Triage Notes (Signed)
Patient complains of hematuria x 2 weeks. Seen by urology and has CT scheduled for Friday to r/o kidney stone. This am developed nausea and bilateral flank pain.

## 2017-11-22 NOTE — ED Provider Notes (Signed)
[facility] EMERGENCY DEPARTMENT Provider Note   CSN: [id number] Arrival date & time: 11/22/17  1319     History   Chief Complaint Chief Complaint  Patient presents with  . flank pain/kidney stone?    HPI Larry [name] is a 83 y.o. male.  He presents for evaluation of bilateral flank pain which started today, associated with hematuria present for 2 weeks.  Previously the hematuria was painless.  He has a remote history of passing a kidney stone.  He saw urology about 1 week ago and was scheduled for a CT scan of the kidneys, to be done in 4 days.  Today he has been nauseated and vomited several times.  He denies fever, chills, weakness or dizziness.  There are no other known modifying factors.  HPI  Past Medical History:  Diagnosis Date  . Allergic rhinitis, cause unspecified 03/12/2013  . Allergy   . CAD (coronary artery disease)    S/P CABG in 1999   . Cataract   . Decreased exercise tolerance    Exertional fatigue  . Degenerative arthritis of hip   . Diverticulosis of colon   . ED (erectile dysfunction)   . Erectile dysfunction 06/03/2014  . History of colonic polyps   . History of kidney stones   . History of MI (myocardial infarction)   . Hx of adenomatous colonic polyps 2000  . Hyperlipidemia    Low HDL  . Lumbar disc disease    Hx of with recent back pain and buttock pain  . Myocardial infarction ([place]) 1999  . Right knee meniscal tear   . S/P removal of thyroid nodule   . Spinal stenosis of lumbar region 03/15/2014    Patient Active Problem List   Diagnosis Date Noted  . Gait disorder 04/20/2016  . Hyperglycemia 04/20/2016  . Abnormal CT scan, chest 06/03/2014  . Erectile dysfunction 06/03/2014  . Spinal stenosis of lumbar region 03/15/2014  . Cough 03/12/2013  . Allergic rhinitis 03/12/2013  . Leg pain, bilateral 12/30/2011  . Bladder neck obstruction 12/30/2011  . Preventative health care 12/25/2011  . Lumbar disc disease  12/25/2011  . S/P removal of thyroid nodule 12/25/2011  . History of MI (myocardial infarction) 12/25/2011  . Right knee meniscal tear 12/25/2011  . Degenerative arthritis of hip 12/25/2011  . HIP PAIN, RIGHT 05/14/2010  . FATIGUE 10/13/2009  . HYPERTENSION, BENIGN 02/25/2009  . CAD, AUTOLOGOUS BYPASS GRAFT 02/25/2009  . CORONARY ARTERY ANEURYSM 02/25/2009  . BACK PAIN 04/15/2008  . Hyperlipidemia 02/13/2008  . CORONARY ARTERY DISEASE 02/13/2008  . DIVERTICULOSIS, COLON 02/13/2008  . CARDIAC DISEASE, HX OF 02/13/2008  . COLONIC POLYPS, HX OF 02/13/2008    Past Surgical History:  Procedure Laterality Date  . CARDIAC CATHETERIZATION    . CATARACT EXTRACTION    . COLONOSCOPY    . CORONARY ARTERY BYPASS GRAFT  1999  . LUMBAR DISC SURGERY    . Nodules on Thyroid    . POLYPECTOMY    . PTCA    . SHOULDER ARTHROSCOPY WITH ROTATOR CUFF REPAIR AND SUBACROMIAL DECOMPRESSION Right 01/30/2015   Procedure: RIGHT ARTHROSCOPY SHOULDER SUBACROMIAL DECOMPRESSION DISTAL CLAVICAL RESECTION RETATOR CUFF REPAIR;  Surgeon: Justice Britain, MD;  Location: [facility];  Service: Orthopedics;  Laterality: Right;  . TONSILLECTOMY    . VASECTOMY         Home Medications    Prior to Admission medications   Medication Sig Start Date End Date Taking? Authorizing Provider  acetaminophen (TYLENOL) 650  MG CR tablet Take 650 mg by mouth every 8 (eight) hours as needed for pain.     [provider]  aspirin 81 MG EC tablet Take 81 mg by mouth at bedtime.     [provider]  calcium-vitamin D (OSCAL WITH D) 500-200 MG-UNIT per tablet Take 1 tablet by mouth 2 (two) times daily.     [provider]  clopidogrel (PLAVIX) 75 MG tablet Take 1 tablet (75 mg total) by mouth daily. 07/04/17   Burnell Blanks, MD  fish oil-omega-3 fatty acids 1000 MG capsule Take 1 g by mouth 2 (two) times daily.     [provider]  folic acid (FOLVITE) 329 MCG tablet Take 400 mcg by mouth daily.       [provider]  Multiple Vitamin (MULTIVITAMIN WITH MINERALS) TABS tablet Take 1 tablet by mouth daily. Centrum Silver    [provider]  predniSONE (DELTASONE) 10 MG tablet 2 tabs by mouth per day for 5 days 08/31/17   Biagio Borg, MD  rosuvastatin (CRESTOR) 5 MG tablet TAKE 1 TABLET  ALTERNATING  WITH  2  TABLETS  EVERY DAY 09/12/17   Burnell Blanks, MD  triamcinolone (NASACORT) 55 MCG/ACT AERO nasal inhaler Place 2 sprays into the nose daily. 08/31/17   Biagio Borg, MD    Family History Family History  Problem Relation Age of Onset  . Prostate cancer Other   . Prostate cancer Other   . Heart disease Mother   . Heart disease Brother   . Colon cancer Neg Hx   . Rectal cancer Neg Hx   . Stomach cancer Neg Hx     Social History Social History   Tobacco Use  . Smoking status: Never Smoker  . Smokeless tobacco: Never Used  Substance Use Topics  . Alcohol use: Yes    Alcohol/week: 0.0 oz    Comment: rare  . Drug use: No     Allergies   Beta adrenergic blockers and Simvastatin   Review of Systems Review of Systems  All other systems reviewed and are negative.    Physical Exam Updated Vital Signs BP (!) 155/102   Pulse 84   Temp 98 F (36.7 C) (Oral)   Resp 18   SpO2 98%   Physical Exam  Constitutional: He is oriented to person, place, and time. He appears well-developed and well-nourished. He appears distressed (Uncomfortable).  HENT:  Head: Normocephalic and atraumatic.  Right Ear: External ear normal.  Left Ear: External ear normal.  Eyes: Conjunctivae and EOM are normal. Pupils are equal, round, and reactive to light.  Neck: Normal range of motion and phonation normal. Neck supple.  Cardiovascular: Normal rate, regular rhythm and normal heart sounds.  Pulmonary/Chest: Effort normal and breath sounds normal. He exhibits no bony tenderness.  Abdominal: Soft. There is tenderness (Right upper quadrant and midepigastrium, mild).    Genitourinary:  Genitourinary Comments: Mild left costovertebral angle tenderness, with percussion  Musculoskeletal: Normal range of motion.  Neurological: He is alert and oriented to person, place, and time. No cranial nerve deficit or sensory deficit. He exhibits normal muscle tone. Coordination normal.  Skin: Skin is warm, dry and intact.  Psychiatric: He has a normal mood and affect. His behavior is normal. Judgment and thought content normal.  Nursing note and vitals reviewed.    ED Treatments / Results  Labs (all labs ordered are listed, but only abnormal results are displayed) Labs Reviewed  CBC -  Abnormal; Notable for the following components:      Result Value   WBC 11.3 (*)    Hemoglobin 17.3 (*)    All other components within normal limits  URINALYSIS, ROUTINE W REFLEX MICROSCOPIC  BASIC METABOLIC PANEL  HEPATIC FUNCTION PANEL  LIPASE, BLOOD    EKG  EKG Interpretation None       Radiology No results found.  Procedures Procedures (including critical care time)  Medications Ordered in ED Medications  ondansetron (ZOFRAN-ODT) disintegrating tablet 4 mg (4 mg Oral Given 11/22/17 1352)     Initial Impression / Assessment and Plan / ED Course  I have reviewed the triage vital signs and the nursing notes.  Pertinent labs & imaging results that were available during my care of the patient were reviewed by me and considered in my medical decision making (see chart for details).      Patient Vitals for the past 24 hrs:  BP Temp Temp src Pulse Resp SpO2  11/22/17 1431 (!) 151/87 - - 74 18 96 %  11/22/17 1343 (!) 155/102 98 F (36.7 C) Oral 84 18 98 %    Care to Dr. Regenia Skeeter    Final Clinical Impressions(s) / ED Diagnoses   Final diagnoses:  Left ureteral stone   Flank pain with hematuria. Evluation for obstructing stone undertaken  Nursing Notes Reviewed/ Care Coordinated Applicable Imaging Reviewed Interpretation of Laboratory Data  incorporated into ED treatment   Disposition per Dr. Regenia Skeeter after return of testing  ED Discharge Orders    None       Daleen Bo, MD 11/24/17 [phone]

## 2017-11-22 NOTE — ED Provider Notes (Signed)
Patient CT scan shows left ureteral stone with some obstruction.  This is likely the cause of his pain.  His pain is better controlled at this time.  Urine shows no leukocytes but does show nitrites.  He has not been on Pyridium recently.  There is also some yeast in his urine.  Cover with Diflucan.  I do not think his presentation represents an infected ureteral stone but will send urine for culture and add on Keflex for now.  Follow-up with his urologist, Dr. Junious Silk.  Strict return precautions.  He does not feel like he needs narcotic pain meds at home but will give him a short course of Norco for possible breakthrough pain.  Results for orders placed or performed during the hospital encounter of 11/22/17  Urinalysis, Routine w reflex microscopic- may I&O cath if menses  Result Value Ref Range   Color, Urine BROWN (A) YELLOW   APPearance TURBID (A) CLEAR   Specific Gravity, Urine >1.030 (H) 1.005 - 1.030   pH 6.5 5.0 - 8.0   Glucose, UA NEGATIVE NEGATIVE mg/dL   Hgb urine dipstick LARGE (A) NEGATIVE   Bilirubin Urine MODERATE (A) NEGATIVE   Ketones, ur 15 (A) NEGATIVE mg/dL   Protein, ur 100 (A) NEGATIVE mg/dL   Nitrite POSITIVE (A) NEGATIVE   Leukocytes, UA NEGATIVE NEGATIVE  CBC  Result Value Ref Range   WBC 11.3 (H) 4.0 - 10.5 K/uL   RBC 5.37 4.22 - 5.81 MIL/uL   Hemoglobin 17.3 (H) 13.0 - 17.0 g/dL   HCT 49.9 39.0 - 52.0 %   MCV 92.9 78.0 - 100.0 fL   MCH 32.2 26.0 - 34.0 pg   MCHC 34.7 30.0 - 36.0 g/dL   RDW 13.4 11.5 - 15.5 %   Platelets 214 150 - 400 K/uL  Basic metabolic panel  Result Value Ref Range   Sodium 141 135 - 145 mmol/L   Potassium 4.6 3.5 - 5.1 mmol/L   Chloride 106 101 - 111 mmol/L   CO2 27 22 - 32 mmol/L   Glucose, Bld 148 (H) 65 - 99 mg/dL   BUN 18 6 - 20 mg/dL   Creatinine, Ser 1.32 (H) 0.61 - 1.24 mg/dL   Calcium 9.4 8.9 - 10.3 mg/dL   GFR calc non Af Amer 48 (L) >60 mL/min   GFR calc Af Amer 56 (L) >60 mL/min   Anion gap 8 5 - 15  Hepatic function  panel  Result Value Ref Range   Total Protein 6.9 6.5 - 8.1 g/dL   Albumin 4.0 3.5 - 5.0 g/dL   AST 26 15 - 41 U/L   ALT 22 17 - 63 U/L   Alkaline Phosphatase 40 38 - 126 U/L   Total Bilirubin 1.2 0.3 - 1.2 mg/dL   Bilirubin, Direct 0.2 0.1 - 0.5 mg/dL   Indirect Bilirubin 1.0 (H) 0.3 - 0.9 mg/dL  Lipase, blood  Result Value Ref Range   Lipase 32 11 - 51 U/L  Urinalysis, Microscopic (reflex)  Result Value Ref Range   RBC / HPF TOO NUMEROUS TO COUNT 0 - 5 RBC/hpf   WBC, UA 6-30 0 - 5 WBC/hpf   Bacteria, UA FEW (A) NONE SEEN   Squamous Epithelial / LPF 0-5 (A) NONE SEEN   Budding Yeast PRESENT    Ca Oxalate Crys, UA PRESENT    Ct Renal Stone Study  Result Date: 11/22/2017 CLINICAL DATA:  Acute bilateral flank pain, gross hematuria. EXAM: CT ABDOMEN AND PELVIS WITHOUT CONTRAST  TECHNIQUE: Multidetector CT imaging of the abdomen and pelvis was performed following the standard protocol without IV contrast. COMPARISON:  CT scan of May 06, 2014. FINDINGS: Lower chest: No acute abnormality. Hepatobiliary: No focal liver abnormality is seen. No gallstones, gallbladder wall thickening, or biliary dilatation. Pancreas: Unremarkable. No pancreatic ductal dilatation or surrounding inflammatory changes. Spleen: Normal in size without focal abnormality. Adrenals/Urinary Tract: Adrenal glands appear normal. Right kidney and ureter are unremarkable per stable partially calcified large cyst is seen arising from lower pole of left kidney. Mild left hydroureteronephrosis is noted secondary to 6 mm calculus in midportion of right ureter. Urinary bladder is unremarkable. Stomach/Bowel: Stomach is within normal limits. Appendix appears normal. No evidence of bowel wall thickening, distention, or inflammatory changes. Sigmoid diverticulosis is noted without inflammation. Vascular/Lymphatic: Aortic atherosclerosis. No enlarged abdominal or pelvic lymph nodes. Reproductive: Mild prostatic enlargement is noted. Other:  Small fat containing left inguinal hernia is noted. No abnormal fluid collection is noted. Musculoskeletal: No acute or significant osseous findings. IMPRESSION: Mild left hydroureteronephrosis is noted secondary to 6 mm right ureteral calculus. Sigmoid diverticulosis is noted without inflammation. Aortic atherosclerosis. Mild prostatic enlargement. Electronically Signed   By: Marijo Conception, M.D.   On: 11/22/2017 16:37      Sherwood Gambler, MD 11/22/17 [phone]

## 2017-11-23 LAB — URINE CULTURE: CULTURE: NO GROWTH

## 2017-11-30 DIAGNOSIS — R310 Gross hematuria: Secondary | ICD-10-CM | POA: Diagnosis not present

## 2017-11-30 DIAGNOSIS — N201 Calculus of ureter: Secondary | ICD-10-CM | POA: Diagnosis not present

## 2017-12-01 DIAGNOSIS — D044 Carcinoma in situ of skin of scalp and neck: Secondary | ICD-10-CM | POA: Diagnosis not present

## 2017-12-01 DIAGNOSIS — C4442 Squamous cell carcinoma of skin of scalp and neck: Secondary | ICD-10-CM | POA: Diagnosis not present

## 2017-12-08 ENCOUNTER — Other Ambulatory Visit: Payer: Self-pay | Admitting: Urology

## 2017-12-08 ENCOUNTER — Telehealth: Payer: Self-pay | Admitting: Cardiovascular Disease

## 2017-12-08 NOTE — Telephone Encounter (Signed)
° °  [facility] Pre-operative Risk Assessment    Request for surgical clearance:  1. What type of surgery is being performed? Cystoscopy Ureteroscopy Laser Lithotripsy  2. When is this surgery scheduled? 12-16-17   Are there any medications that need to be held prior to surgery and how long?Can pt hold Plavix 5 days prior to surgery -need this back asap 3. Practice name and name of physician performing surgery?Dr Dell Ponto   4. What is your office phone and fax number? [phone] and fax is [phone]   5. Anesthesia type (None, local, MAC, general) ? General   Larry [name] 12/08/2017, 10:05 AM  _________________________________________________________________   (provider comments below)

## 2017-12-09 NOTE — Telephone Encounter (Signed)
Pt is 2 on risk scale.  He is able to be active to 4 mets but he does walk with a cane . No chest pain and no SOB.  Will send to Dr. Angelena Form to see ok off plavix and if he agrees with clearance.  Surgery is 12/23/16.

## 2017-12-14 NOTE — Telephone Encounter (Signed)
It appears Mickel Baas has reviewed the case and forwarded to Dr. Angelena Form to decide, Dr. Angelena Form is currently on vacation, will be in the hospital on 12/16/2017. The preop APP this Friday will need to followup on this. Will forward to [employer] PA-C preop APP for this Friday to followup on this.

## 2017-12-15 NOTE — Telephone Encounter (Signed)
Patient made aware to hold plavix 5 days prior to his procedure. He has verbalized his understanding.

## 2017-12-15 NOTE — Telephone Encounter (Signed)
   Primary Cardiologist: Dr. Angelena Form  Chart reviewed as part of pre-operative protocol coverage. Given past medical history and time since last visit, based on ACC/AHA guidelines, Anes Rigel would be at acceptable risk for the planned procedure without further cardiovascular testing.   Addressed holding Plavix with Dr. Angelena Form and this can be held 5 days prior to his procedure.   I will route this recommendation to the requesting party via Epic fax function and remove from pre-op pool.  Please call with questions.  Erma Heritage, PA-C 12/15/2017, 7:50 AM

## 2017-12-15 NOTE — Telephone Encounter (Signed)
I agree with clearance for the procedure. He can hold his Plavix.   Lauree Chandler

## 2017-12-16 NOTE — Progress Notes (Signed)
Cardiac clearance Dr Lauree Chandler 12-08-17 telephone note epic/ on chart   LOV cardiology Dr Lauree Chandler 04-07-17 epic

## 2017-12-16 NOTE — Patient Instructions (Addendum)
Larry [name]  12/16/2017   Your procedure is scheduled on: 12-23-17  Report to [facility] Main  Entrance   Follow signs to Short Stay on first floor at 5:30AM    Call this number if you have problems the morning of surgery [phone]    Remember: ONLY 1 PERSON MAY GO WITH YOU TO SHORT STAY TO GET  READY MORNING OF [place].  Do not eat food or drink liquids :After Midnight.     Take these medicines the morning of surgery with A SIP OF WATER: NONE                                 You may not have any metal on your body including hair pins and              piercings  Do not wear jewelry, make-up, lotions, powders or perfumes, deodorant                          Men may shave face and neck.   Do not bring valuables to the hospital. [place].  Contacts, dentures or bridgework may not be worn into surgery.      Patients discharged the day of surgery will not be allowed to drive home.  Name and phone number of your driver:  Special Instructions: N/A              Please read over the following fact sheets you were given: _____________________________________________________________________             [facility] - Preparing for Surgery Before surgery, you can play an important role.  Because skin is not sterile, your skin needs to be as free of germs as possible.  You can reduce the number of germs on your skin by washing with CHG (chlorahexidine gluconate) soap before surgery.  CHG is an antiseptic cleaner which kills germs and bonds with the skin to continue killing germs even after washing. Please DO NOT use if you have an allergy to CHG or antibacterial soaps.  If your skin becomes reddened/irritated stop using the CHG and inform your nurse when you arrive at Short Stay. Do not shave (including legs and underarms) for at least 48 hours prior to the first CHG shower.  You may shave your  face/neck. Please follow these instructions carefully:  1.  Shower with CHG Soap the night before surgery and the  morning of Surgery.  2.  If you choose to wash your hair, wash your hair first as usual with your  normal  shampoo.  3.  After you shampoo, rinse your hair and body thoroughly to remove the  shampoo.                           4.  Use CHG as you would any other liquid soap.  You can apply chg directly  to the skin and wash                       Gently with a scrungie or clean washcloth.  5.  Apply the CHG Soap to your body ONLY  FROM THE NECK DOWN.   Do not use on face/ open                           Wound or open sores. Avoid contact with eyes, ears mouth and genitals (private parts).                       Wash face,  Genitals (private parts) with your normal soap.             6.  Wash thoroughly, paying special attention to the area where your surgery  will be performed.  7.  Thoroughly rinse your body with warm water from the neck down.  8.  DO NOT shower/wash with your normal soap after using and rinsing off  the CHG Soap.                9.  Pat yourself dry with a clean towel.            10.  Wear clean pajamas.            11.  Place clean sheets on your bed the night of your first shower and do not  sleep with pets. Day of Surgery : Do not apply any lotions/deodorants the morning of surgery.  Please wear clean clothes to the hospital/surgery center.  FAILURE TO FOLLOW THESE INSTRUCTIONS MAY RESULT IN THE CANCELLATION OF YOUR SURGERY PATIENT SIGNATURE_________________________________  NURSE SIGNATURE__________________________________  ________________________________________________________________________

## 2017-12-19 ENCOUNTER — Other Ambulatory Visit: Payer: Self-pay

## 2017-12-19 ENCOUNTER — Encounter (HOSPITAL_COMMUNITY)
Admission: RE | Admit: 2017-12-19 | Discharge: 2017-12-19 | Disposition: A | Discharge status: Home / Self Care | Payer: Medicare Other | Source: Ambulatory Visit | Attending: Urology | Admitting: Urology

## 2017-12-19 ENCOUNTER — Encounter (HOSPITAL_COMMUNITY): Payer: Self-pay

## 2017-12-19 DIAGNOSIS — Z01812 Encounter for preprocedural laboratory examination: Secondary | ICD-10-CM | POA: Diagnosis not present

## 2017-12-19 DIAGNOSIS — Z01810 Encounter for preprocedural cardiovascular examination: Secondary | ICD-10-CM | POA: Diagnosis not present

## 2017-12-19 DIAGNOSIS — I519 Heart disease, unspecified: Secondary | ICD-10-CM | POA: Insufficient documentation

## 2017-12-19 LAB — CBC
HCT: 46.7 % (ref 39.0–52.0)
HEMOGLOBIN: 16 g/dL (ref 13.0–17.0)
MCH: 31.6 pg (ref 26.0–34.0)
MCHC: 34.3 g/dL (ref 30.0–36.0)
MCV: 92.3 fL (ref 78.0–100.0)
PLATELETS: 222 10*3/uL (ref 150–400)
RBC: 5.06 MIL/uL (ref 4.22–5.81)
RDW: 13.2 % (ref 11.5–15.5)
WBC: 8.4 10*3/uL (ref 4.0–10.5)

## 2017-12-19 LAB — BASIC METABOLIC PANEL
ANION GAP: 7 (ref 5–15)
BUN: 18 mg/dL (ref 6–20)
CALCIUM: 9.1 mg/dL (ref 8.9–10.3)
CO2: 26 mmol/L (ref 22–32)
CREATININE: 1 mg/dL (ref 0.61–1.24)
Chloride: 107 mmol/L (ref 101–111)
Glucose, Bld: 120 mg/dL — ABNORMAL HIGH (ref 65–99)
Potassium: 3.7 mmol/L (ref 3.5–5.1)
SODIUM: 140 mmol/L (ref 135–145)

## 2017-12-19 LAB — PROTIME-INR
INR: 0.99
Prothrombin Time: 13 seconds (ref 11.4–15.2)

## 2017-12-22 NOTE — H&P (Signed)
Office Visit Report     11/30/2017   --------------------------------------------------------------------------------   Larry [name]  MRN: [id number]  PRIMARY CARE:  Larry Cower, MD  DOB: 1932-03-20, 84 year old Male  REFERRING:  Larry Dalton, NP  SSN: [id number]  PROVIDER:  Festus Aloe, M.D.    TREATING:  Larry [name]    LOCATION:  [facility] [phone]   --------------------------------------------------------------------------------   CC: I have blood in my urine.  HPI: Larry [name] is a 84 year-old male established patient who is here for blood in the urine.  09/19/17: GU HX:   1-microscopic hematuria-hx April 2015, workup negative.   2-nocturia- ongoing nocturia, not bothersome. On flomax holiday. PVR 13.4cc. DRE ~50g prostate, wnl   3-renal cyst- large left renal cyst seen on May 2015, Bosniak 2  - RUS 2018 to evaluate size change in last 3 years. Grossly unchanged in size/complexity from imaging studies 3 years ago. Stable on recent non-contrast imaging (December 2018).   4-Left ureteral stone-May 2015-possible 24mm distal left ureteral stone on non contrast CT, passed without recurrent stone episode.   November 09, 2017: Patient has discontinued tamsulosin with no notable changes in voiding characteristics or increased frequency/urgency. He's had painless hematuria for the past 3 days. No associated worsening or new lower urinary tract symptoms. Denies any flank or back pain. Afebrile. No recent infection treatment.   He first noticed the symptoms approximately 11/06/2017. He did see the blood in his urine. He has not seen blood clots.   He does not have a burning sensation when he urinates. He is not currently having trouble urinating.   He has had kidney stones. He is not having pain. He has not recently had unwanted weight loss.   His last U/S or CT Scan was 09/19/2017.     CC: I have a history of kidney stones.  HPI: 11/30/17: When I  saw him last I had ordered a hematuria evaluation as patient was without symptoms other than gross hematuria. Not sure why this was never scheduled the patient subsequently developed pain with ongoing hematuria and was evaluated in the emergency department where CT imaging was performed noting a left mid-ureteral calculi measuring between 5 & 6 mm respectively. SSD around 18 cm. I could see the calculus overlying the left upper sacrum using localizer mode. There was some mild obstructive sign on the corresponding side as well.    Today he notes not an overwhelming amount of flank or back pain. He denies any abdominal pain or discomfort. Denies any interval stone material passed. He has had intermittent hematuria but no other changes in voiding symptoms including increased urgency or changes in urine stream. Denies dysuria. He is afebrile but states feeling malaise and nauseous. He was prescribed a 5 day course of Keflex and placed back on tamsulosin.   The patient was last seen 11/09/17. The patient's stone was on his left side. He did not pass a stone since the last office visit. The patient has been taking Tamsulosin for their calculus disease.   The patient has had flank pain since they were last seen. The patient denies any progressive voiding symptoms. He has yes seen blood in his urine since the last visit. He is currently having nausea. He denies having flank pain, back pain, groin pain, vomiting, fever, and chills. He has never had surgical treatment for calculi in the past.     ALLERGIES: No Allergies    MEDICATIONS: Crestor 5 mg  tablet  Zyrtec 10 mg tablet  Aspirin Ec 81 mg tablet, delayed release Oral  Calcium + D  Fish Oil CAPS Oral  Folic Acid 0.8 mg tablet Oral  Multivitamin  Plavix 75 mg tablet Oral  Tamsulosin Hcl 0.4 mg capsule, ext release 24 hr Oral  Tylenol     GU PSH: Repair Sperm Duct - 2015    NON-GU PSH: Back Surgery (Unspecified) Cataract surgery Coronary Artery  Bypass Grafting Rotator cuff surgery Thyroid Surgery    GU PMH: Gross hematuria - 11/09/2017 Renal calculus, Left, Stable appearing mid-lower pole calculi but there are also some vascular vs calcinosis structures noted in the same area on u/s. No hydro or proximal ureteral dilation. 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, Renal neoplasm - 2015 Nocturia, Nocturia - 2015    NON-GU PMH: Encounter for general adult medical examination without abnormal findings, Encounter for preventive health examination - 2015 Myocardial Infarction, History of acute myocardial infarction - 2015 Coronary Artery Disease Hypercholesterolemia    FAMILY HISTORY: Heart Disease - Mother, Brother malignant neoplasm of brain - Father malignant neoplasm of prostate - Grandfather, Uncle renal failure - Brother   SOCIAL HISTORY: Marital Status: Married Preferred Language: English; Ethnicity: Not Hispanic Or Latino; Race: White Current Smoking Status: Patient has never smoked.   Tobacco Use Assessment Completed: Used Tobacco in last 30 days? Drinks 2 drinks per month.  Drinks 3 caffeinated drinks per day. Patient's occupation is/was Retired.    REVIEW OF SYSTEMS:    GU Review Male:   Patient denies frequent urination, hard to postpone urination, burning/ pain with urination, get up at night to urinate, leakage of urine, stream starts and stops, trouble starting your stream, have to strain to urinate , erection problems, and penile pain.  Gastrointestinal (Upper):   Patient reports nausea. Patient denies indigestion/ heartburn and vomiting.  Gastrointestinal (Lower):   Patient denies diarrhea and constipation.  Constitutional:   Patient reports fatigue. Patient denies fever, night sweats, and weight loss.  Skin:   Patient denies skin rash/ lesion and  itching.  Eyes:   Patient denies blurred vision and double vision.  Ears/ Nose/ Throat:   Patient denies sore throat and sinus problems.  Hematologic/Lymphatic:   Patient denies swollen glands and easy bruising.  Cardiovascular:   Patient denies leg swelling and chest pains.  Respiratory:   Patient denies cough and shortness of breath.  Endocrine:   Patient denies excessive thirst.  Musculoskeletal:   Patient reports back pain. Patient denies joint pain.  Neurological:   Patient denies headaches and dizziness.  Psychologic:   Patient denies depression and anxiety.   VITAL SIGNS:      11/30/2017 10:03 AM  BP 132/70 mmHg  Pulse 107 /min  Temperature 98.1 F / 36.7 C   GU PHYSICAL EXAMINATION:    Seminal Vesicles: Nonpalpable.   MULTI-SYSTEM PHYSICAL EXAMINATION:    Constitutional: Well-nourished. No physical deformities. Normally developed. Good grooming.  Neck: Neck symmetrical, not swollen. Normal tracheal position.  Respiratory: Normal breath sounds. No labored breathing, no use of accessory muscles.   Cardiovascular: Regular rate and rhythm. No murmur, no gallop. Normal temperature, normal extremity pulses, no swelling, no varicosities.   Skin: No paleness, no jaundice, no cyanosis. No lesion, no ulcer, no rash.  Neurologic / Psychiatric: Oriented to time, oriented to place, oriented to person. No depression, no anxiety,  no agitation.  Gastrointestinal: No mass, no tenderness, no rigidity, non obese abdomen. No CVAT, no flank or suprapubic tenderness     PAST DATA REVIEWED:  Source Of History:  Patient, Family/Caregiver  Lab Test Review:   BMP  Records Review:   Previous Patient Records  Urine Test Review:   Urinalysis, Urine Culture  X-Ray Review: C.T. Stone Protocol: Reviewed Films. Discussed With Patient.     08/01/17  PSA  Total PSA 0.72 ng/mL   Notes:                     Creatinine on 11/22/17 was 1.3.   PROCEDURES:         KUB - [id number]  A single view of the abdomen  is obtained. The bilateral renal shadows are poorly visualized. No obvious renal calculi identified. The anatomical expected tract of the right ureter appears clear. The anatomical expected tract of the left proximal ureter appears clear. Overlying the left sacrum and into the left hemipelvis there are several opacities that can grossly be seen on CT scout imaging and correlated today that represent vascular and benign-appearing bony calcifications. I can't clearly identify the stones seen on CT imaging last week. I suspect it remains overlying one of the bony structures in the mid ureter. There are several lower pelvic phlebolith identified as well. Bladder appears free of obstruction. Bowel gas pattern within normal limits.               Urinalysis w/Scope Dipstick Dipstick Cont'd Micro  Color: Yellow Bilirubin: Neg WBC/hpf: 0 - 5/hpf  Appearance: Cloudy Ketones: Neg RBC/hpf: 20 - 40/hpf  Specific Gravity: 1.020 Blood: 3+ Bacteria: NS (Not Seen)  pH: 6.0 Protein: Neg Cystals: NS (Not Seen)  Glucose: Neg Urobilinogen: 0.2 Casts: Hyaline    Nitrites: Neg Trichomonas: Not Present    Leukocyte Esterase: Neg Mucous: Present      Epithelial Cells: 0 - 5/hpf      Yeast: NS (Not Seen)      Sperm: Not Present    ASSESSMENT:      ICD-10 Details  1 GU:   Ureteral calculus - N20.1 Left  2   Gross hematuria - R31.0    PLAN:            Medications New Meds: Ondansetron Hcl 4 mg tablet 1 tablet PO Q 6 H PRN   #30  1 Refill(s)            Orders Labs Urine Culture  X-Rays: KUB  X-Ray Notes: ...          Schedule Return Visit/Planned Activity: Other See Visit Notes - Follow up MD, Schedule Surgery          Document Letter(s):  Created for Patient: Clinical Summary         Notes:   KUB inconclusive, there a few areas in question overlying the left upper hemipelvis and left upper portion of the sacrum that could represent the calculus in question but based on the number of opacities in  that area, I'm hesitant to positively identify one of them as the true ureteral calculus. I don't think he would be a good candidate for shockwave lithotripsy at this time based on these findings. I discussed risks, benefits, complications of ureteroscopy versus continued observation. I'll share findings today with his urologist in follow-up with the patient regarding plan of care. He was provided a prescription today for Zofran for nausea. Follow-up instructions were new worsening  lower urinary tract symptoms as well as pain/discomfort given and discussed in detail with understanding expressed by patient and his wife.    * Signed by Larry [name] on 11/30/17 at 11:08 AM (EST)*     The information contained in this medical record document is considered private and confidential patient information. This information can only be used for the medical diagnosis and/or medical services that are being provided by the patient's selected caregivers. This information can only be distributed outside of the patient's care if the patient agrees and signs waivers of authorization for this information to be sent to an outside source or route.  I reviewed the patient's chart, images, notes and labs.  I agree with NP Gibson's assessment and plan.

## 2017-12-23 ENCOUNTER — Ambulatory Visit (HOSPITAL_COMMUNITY): Payer: Medicare Other | Admitting: Anesthesiology

## 2017-12-23 ENCOUNTER — Other Ambulatory Visit: Payer: Self-pay

## 2017-12-23 ENCOUNTER — Ambulatory Visit (HOSPITAL_COMMUNITY)
Admission: RE | Admit: 2017-12-23 | Discharge: 2017-12-23 | Disposition: A | Discharge status: Home / Self Care | Payer: Medicare Other | Source: Ambulatory Visit | Attending: Urology | Admitting: Urology

## 2017-12-23 ENCOUNTER — Encounter (HOSPITAL_COMMUNITY)
Admission: RE | Disposition: A | Discharge status: Home / Self Care | Payer: Self-pay | Source: Ambulatory Visit | Attending: Urology

## 2017-12-23 ENCOUNTER — Encounter (HOSPITAL_COMMUNITY): Payer: Self-pay | Admitting: *Deleted

## 2017-12-23 ENCOUNTER — Ambulatory Visit (HOSPITAL_COMMUNITY): Payer: Medicare Other

## 2017-12-23 DIAGNOSIS — Z87442 Personal history of urinary calculi: Secondary | ICD-10-CM | POA: Diagnosis not present

## 2017-12-23 DIAGNOSIS — I25810 Atherosclerosis of coronary artery bypass graft(s) without angina pectoris: Secondary | ICD-10-CM | POA: Diagnosis not present

## 2017-12-23 DIAGNOSIS — Z7982 Long term (current) use of aspirin: Secondary | ICD-10-CM | POA: Diagnosis not present

## 2017-12-23 DIAGNOSIS — Z951 Presence of aortocoronary bypass graft: Secondary | ICD-10-CM | POA: Insufficient documentation

## 2017-12-23 DIAGNOSIS — M1990 Unspecified osteoarthritis, unspecified site: Secondary | ICD-10-CM | POA: Diagnosis not present

## 2017-12-23 DIAGNOSIS — I2510 Atherosclerotic heart disease of native coronary artery without angina pectoris: Secondary | ICD-10-CM | POA: Diagnosis not present

## 2017-12-23 DIAGNOSIS — N281 Cyst of kidney, acquired: Secondary | ICD-10-CM | POA: Insufficient documentation

## 2017-12-23 DIAGNOSIS — I252 Old myocardial infarction: Secondary | ICD-10-CM | POA: Insufficient documentation

## 2017-12-23 DIAGNOSIS — N132 Hydronephrosis with renal and ureteral calculous obstruction: Secondary | ICD-10-CM | POA: Insufficient documentation

## 2017-12-23 DIAGNOSIS — Z79899 Other long term (current) drug therapy: Secondary | ICD-10-CM | POA: Diagnosis not present

## 2017-12-23 DIAGNOSIS — I10 Essential (primary) hypertension: Secondary | ICD-10-CM | POA: Insufficient documentation

## 2017-12-23 DIAGNOSIS — N201 Calculus of ureter: Secondary | ICD-10-CM | POA: Diagnosis not present

## 2017-12-23 DIAGNOSIS — E785 Hyperlipidemia, unspecified: Secondary | ICD-10-CM | POA: Diagnosis not present

## 2017-12-23 HISTORY — PX: CYSTOSCOPY/URETEROSCOPY/HOLMIUM LASER/STENT PLACEMENT: SHX6546

## 2017-12-23 SURGERY — CYSTOSCOPY/URETEROSCOPY/HOLMIUM LASER/STENT PLACEMENT
Anesthesia: General | Laterality: Left

## 2017-12-23 MED ORDER — FENTANYL CITRATE (PF) 100 MCG/2ML IJ SOLN
25.0000 ug | INTRAMUSCULAR | Status: DC | PRN
  Administered 2017-12-23 (×2): 50 ug via INTRAVENOUS

## 2017-12-23 MED ORDER — FENTANYL CITRATE (PF) 100 MCG/2ML IJ SOLN
INTRAMUSCULAR | Status: DC | PRN
  Administered 2017-12-23 (×2): 25 ug via INTRAVENOUS

## 2017-12-23 MED ORDER — OXYCODONE HCL 5 MG/5ML PO SOLN: 5.0000 mg | Freq: Once | ORAL | Status: DC | PRN

## 2017-12-23 MED ORDER — DEXAMETHASONE SODIUM PHOSPHATE 10 MG/ML IJ SOLN
INTRAMUSCULAR | Status: DC | PRN
  Administered 2017-12-23: 10 mg via INTRAVENOUS

## 2017-12-23 MED ORDER — ARTIFICIAL TEARS OP OINT
TOPICAL_OINTMENT | OPHTHALMIC | Status: AC
  Filled 2017-12-23: qty 3.5

## 2017-12-23 MED ORDER — ONDANSETRON HCL 4 MG/2ML IJ SOLN: 4.0000 mg | Freq: Four times a day (QID) | INTRAMUSCULAR | Status: DC | PRN

## 2017-12-23 MED ORDER — SODIUM CHLORIDE 0.9 % IR SOLN
Status: DC | PRN
  Administered 2017-12-23: 3000 mL via INTRAVESICAL

## 2017-12-23 MED ORDER — PHENYLEPHRINE HCL 10 MG/ML IJ SOLN
INTRAMUSCULAR | Status: DC | PRN
  Administered 2017-12-23 (×4): 40 ug via INTRAVENOUS

## 2017-12-23 MED ORDER — LIDOCAINE 2% (20 MG/ML) 5 ML SYRINGE
INTRAMUSCULAR | Status: DC | PRN
  Administered 2017-12-23: 100 mg via INTRAVENOUS

## 2017-12-23 MED ORDER — PROPOFOL 10 MG/ML IV BOLUS
INTRAVENOUS | Status: DC | PRN
  Administered 2017-12-23: 150 mg via INTRAVENOUS

## 2017-12-23 MED ORDER — PHENYLEPHRINE 40 MCG/ML (10ML) SYRINGE FOR IV PUSH (FOR BLOOD PRESSURE SUPPORT)
PREFILLED_SYRINGE | INTRAVENOUS | Status: AC
  Filled 2017-12-23: qty 10

## 2017-12-23 MED ORDER — CEFAZOLIN SODIUM-DEXTROSE 2-4 GM/100ML-% IV SOLN
2.0000 g | Freq: Once | INTRAVENOUS | Status: AC
  Administered 2017-12-23: 2 g via INTRAVENOUS

## 2017-12-23 MED ORDER — FENTANYL CITRATE (PF) 100 MCG/2ML IJ SOLN
INTRAMUSCULAR | Status: AC
  Filled 2017-12-23: qty 2

## 2017-12-23 MED ORDER — IOHEXOL 300 MG/ML  SOLN
INTRAMUSCULAR | Status: DC | PRN
  Administered 2017-12-23: 4 mL via INTRAVENOUS

## 2017-12-23 MED ORDER — CEFAZOLIN SODIUM-DEXTROSE 2-4 GM/100ML-% IV SOLN
INTRAVENOUS | Status: AC
  Filled 2017-12-23: qty 100

## 2017-12-23 MED ORDER — OXYCODONE HCL 5 MG PO TABS: 5.0000 mg | ORAL_TABLET | Freq: Once | ORAL | Status: DC | PRN

## 2017-12-23 MED ORDER — SODIUM CHLORIDE 0.9 % IR SOLN
Status: DC | PRN
  Administered 2017-12-23: 1000 mL via INTRAVESICAL

## 2017-12-23 MED ORDER — LIDOCAINE 2% (20 MG/ML) 5 ML SYRINGE
INTRAMUSCULAR | Status: AC
  Filled 2017-12-23: qty 5

## 2017-12-23 MED ORDER — PROPOFOL 10 MG/ML IV BOLUS
INTRAVENOUS | Status: AC
  Filled 2017-12-23: qty 20

## 2017-12-23 MED ORDER — ONDANSETRON HCL 4 MG/2ML IJ SOLN
INTRAMUSCULAR | Status: DC | PRN
  Administered 2017-12-23: 4 mg via INTRAVENOUS

## 2017-12-23 MED ORDER — LACTATED RINGERS IV SOLN
INTRAVENOUS | Status: DC | PRN
  Administered 2017-12-23: 07:00:00 via INTRAVENOUS
  Administered 2017-12-23: 1000 mL

## 2017-12-23 MED ORDER — CEPHALEXIN 500 MG PO CAPS: 500.0000 mg | ORAL_CAPSULE | Freq: Every day | ORAL | 0 refills | Status: AC

## 2017-12-23 MED ORDER — ONDANSETRON HCL 4 MG/2ML IJ SOLN
INTRAMUSCULAR | Status: AC
  Filled 2017-12-23: qty 2

## 2017-12-23 MED ORDER — DEXAMETHASONE SODIUM PHOSPHATE 10 MG/ML IJ SOLN
INTRAMUSCULAR | Status: AC
  Filled 2017-12-23: qty 1

## 2017-12-23 SURGICAL SUPPLY — 21 items
BAG URO CATCHER STRL LF (MISCELLANEOUS) ×2 IMPLANT
BASKET ZERO TIP NITINOL 2.4FR (BASKET) ×2 IMPLANT
BSKT STON RTRVL ZERO TP 2.4FR (BASKET) ×1
CATH INTERMIT  6FR 70CM (CATHETERS) ×2 IMPLANT
CATH URET 5FR 28IN CONE TIP (BALLOONS) ×1
CATH URET 5FR 70CM CONE TIP (BALLOONS) ×1 IMPLANT
CATH URET WHISTLE 6FR (CATHETERS) ×1 IMPLANT
CLOTH BEACON ORANGE TIMEOUT ST (SAFETY) ×2 IMPLANT
COVER FOOTSWITCH UNIV (MISCELLANEOUS) IMPLANT
COVER SURGICAL LIGHT HANDLE (MISCELLANEOUS) ×1 IMPLANT
FIBER LASER TRAC TIP (UROLOGICAL SUPPLIES) ×1 IMPLANT
GLOVE BIO SURGEON STRL SZ7.5 (GLOVE) ×3 IMPLANT
GOWN STRL REUS W/TWL XL LVL3 (GOWN DISPOSABLE) ×3 IMPLANT
GUIDEWIRE STR DUAL SENSOR (WIRE) ×2 IMPLANT
MANIFOLD NEPTUNE II (INSTRUMENTS) ×2 IMPLANT
PACK CYSTO (CUSTOM PROCEDURE TRAY) ×2 IMPLANT
SHEATH ACCESS URETERAL 24CM (SHEATH) ×1 IMPLANT
SHEATH URETERAL 12FRX35CM (MISCELLANEOUS) ×2 IMPLANT
STENT CONTOUR 6FRX26X.038 (STENTS) ×1 IMPLANT
TUBING CONNECTING 10 (TUBING) ×2 IMPLANT
WIRE COONS/BENSON .038X145CM (WIRE) ×1 IMPLANT

## 2017-12-23 NOTE — Interval H&P Note (Signed)
History and Physical Interval Note:  12/23/2017 7:22 AM  Larry [name]  has presented today for surgery, with the diagnosis of LEFT URETERAL STONE  The various methods of treatment have been discussed with the patient and family. After consideration of risks, benefits and other options for treatment, the patient has consented to  Procedure(s) with comments: CYSTOSCOPY/RETROGRADE/URETEROSCOPY/HOLMIUM LASER/STENT PLACEMENT (Left) - ONLY NEEDS 60 MIN as a surgical intervention . He continues to have left flank and now SP/groin pain. Discussed he may have passed the stone last night (more pain). No fever or dysuria. Discussed he may need a staged procedure.  The patient's history has been reviewed, patient examined, no change in status, stable for surgery.  I have reviewed the patient's chart and labs.  Questions were answered to the patient's satisfaction.     Festus Aloe

## 2017-12-23 NOTE — Op Note (Signed)
Preoperative diagnosis: Left ureteral stone, left hydronephrosis Postoperative diagnosis: Same  Procedure: Cystoscopy, left retrograde pyelogram, left ureteroscopy with holmium laser lithotripsy and stent  Surgeon: Junious Silk  Anesthesia: General  Indication for procedure: 85 year old with left mid ureteral stone and persistent pain.  Findings: Cystoscopy was unremarkable apart from bloody reflux from the left ureteral orifice.  No stone or foreign body in the bladder.  Prostate was slightly enlarged with high medium bar, small median lobe.  Left retrograde pyelogram-this outlined a single ureter single system unit with a filling defect in the distal ureter consistent with the stone and moderate dilation proximally.  Stone was noted on left ureteroscopy in the distal ureter.  Ureter was quite tight with inflammatory changes and whitish tissue along the distal ureter.  Ureter was slightly J hook.  Description of procedure: After consent was obtained patient brought to the operating room.  After adequate anesthesia he was placed in lithotomy position and prepped and draped in the usual sterile fashion.  A timeout was performed to confirm the patient and procedure.  The cystoscope was passed per urethra and the bladder inspected.  A cone-tip catheter was used to cannulate the left ureteral orifice and retrograde injection of contrast was performed.  A sensor wire was advanced and coiled in the collecting system.  A 4 5 needlepoint semirigid ureteroscope was passed but could not negotiate the intramural ureter due to some of the J hook.  I gently dilated it with the inner portion of an access sheath and this created enough room to visualize the scope through into the distal ureter.  The stone was located and it was broken up at 0.8 and 8 and all the fragments sequentially dropped into the bladder with a 0 tip basket.  Was able to visualize with the scope up to the iliacs and just beyond no other stone  fragments were noted.  The wire was noted to be in the lumen.  The scope was backed out and the ureter noted to be normal apart from some significant inflammation along the stone impaction in the distal ureter.  The wire was backloaded on the cystoscope and a 6 x 26 cm stent was advanced.  Wire was removed with a good coil noted in the collecting system on fluoroscopy and a good coil in the bladder.  The bladder was drained and the scope removed.  He was awakened taken to the recovery room in stable condition.  Complications: None  Blood loss: Minimal  Specimens: Stone fragments office lab  Drains: 6 x 26 cm left ureteral stent with string

## 2017-12-23 NOTE — Anesthesia Postprocedure Evaluation (Signed)
Anesthesia Post Note  Patient: [name]  Procedure(s) Performed: CYSTOSCOPY/RETROGRADE/URETEROSCOPY/HOLMIUM LASER/STENT PLACEMENT (Left )     Patient location during evaluation: PACU Anesthesia Type: General Level of consciousness: awake and alert Pain management: pain level controlled Vital Signs Assessment: post-procedure vital signs reviewed and stable Respiratory status: spontaneous breathing, nonlabored ventilation, respiratory function stable and patient connected to nasal cannula oxygen Cardiovascular status: blood pressure returned to baseline and stable Postop Assessment: no apparent nausea or vomiting Anesthetic complications: no    Last Vitals:  Vitals:   12/23/17 0942 12/23/17 1025  BP: (!) 152/84 (!) 156/87  Pulse: 60 71  Resp: 15 16  Temp: 36.8 C 36.5 C  SpO2: 95% 98%    Last Pain:  Vitals:   12/23/17 1025  TempSrc: Oral  PainSc:                  [place] S

## 2017-12-23 NOTE — Transfer of Care (Signed)
Immediate Anesthesia Transfer of Care Note  Patient: [name]  Procedure(s) Performed: CYSTOSCOPY/RETROGRADE/URETEROSCOPY/HOLMIUM LASER/STENT PLACEMENT (Left )  Patient Location: PACU  Anesthesia Type:General  Level of Consciousness: awake, drowsy and patient cooperative  Airway & Oxygen Therapy: Patient Spontanous Breathing and Patient connected to face mask oxygen  Post-op Assessment: Report given to RN and Post -op Vital signs reviewed and stable  Post vital signs: Reviewed and stable  Last Vitals:  Vitals:   12/23/17 0546  BP: (!) 157/74  Pulse: 96  Resp: 18  Temp: 37 C  SpO2: 98%    Last Pain:  Vitals:   12/23/17 0546  TempSrc: Oral  PainSc: 3          Complications: No apparent anesthesia complications

## 2017-12-23 NOTE — Discharge Instructions (Signed)
General Anesthesia, Adult, Care After These instructions provide you with information about caring for yourself after your procedure. Your health care provider may also give you more specific instructions. Your treatment has been planned according to current medical practices, but problems sometimes occur. Call your health care provider if you have any problems or questions after your procedure. What can I expect after the procedure? After the procedure, it is common to have:  Vomiting.  A sore throat.  Mental slowness.  It is common to feel:  Nauseous.  Cold or shivery.  Sleepy.  Tired.  Sore or achy, even in parts of your body where you did not have surgery.  Follow these instructions at home: For at least 24 hours after the procedure:  Do not: ? Participate in activities where you could fall or become injured. ? Drive. ? Use heavy machinery. ? Drink alcohol. ? Take sleeping pills or medicines that cause drowsiness. ? Make important decisions or sign legal documents. ? Take care of children on your own.  Rest. Eating and drinking  If you vomit, drink water, juice, or soup when you can drink without vomiting.  Drink enough fluid to keep your urine clear or pale yellow.  Make sure you have little or no nausea before eating solid foods.  Follow the diet recommended by your health care provider. General instructions  Have a responsible adult stay with you until you are awake and alert.  Return to your normal activities as told by your health care provider. Ask your health care provider what activities are safe for you.  Take over-the-counter and prescription medicines only as told by your health care provider.  If you smoke, do not smoke without supervision.  Keep all follow-up visits as told by your health care provider. This is important. Contact a health care provider if:  You continue to have nausea or vomiting at home, and medicines are not helpful.  You  cannot drink fluids or start eating again.  You cannot urinate after 8-12 hours.  You develop a skin rash.  You have fever.  You have increasing redness at the site of your procedure. Get help right away if:  You have difficulty breathing.  You have chest pain.  You have unexpected bleeding.  You feel that you are having a life-threatening or urgent problem. This information is not intended to replace advice given to you by your health care provider. Make sure you discuss any questions you have with your health care provider. Document Released: 03/14/2001 Document Revised: 05/10/2016 Document Reviewed: 11/20/2015 Elsevier Interactive Patient Education  2018 [employer]. Ureteral Stent Implantation, Care After Refer to this sheet in the next few weeks. These instructions provide you with information about caring for yourself after your procedure. Your health care provider may also give you more specific instructions. Your treatment has been planned according to current medical practices, but problems sometimes occur. Call your health care provider if you have any problems or questions after your procedure.  Removal of the stent: Remove the stent by pulling the string Tuesday morning, December 27, 2017.  What can I expect after the procedure? After the procedure, it is common to have:  Nausea.  Mild pain when you urinate. You may feel this pain in your lower back or lower abdomen. Pain should stop within a few minutes after you urinate. This may last for up to 1 week.  A small amount of blood in your urine for several days.  Follow these instructions  at home:  Medicines  Take over-the-counter and prescription medicines only as told by your health care provider.  If you were prescribed an antibiotic medicine, take it as told by your health care provider. Do not stop taking the antibiotic even if you start to feel better.  Do not drive for 24 hours if you received a  sedative.  Do not drive or operate heavy machinery while taking prescription pain medicines. Activity  Return to your normal activities as told by your health care provider. Ask your health care provider what activities are safe for you.  Do not lift anything that is heavier than 10 lb (4.5 kg). Follow this limit for 1 week after your procedure, or for as long as told by your health care provider. General instructions  Watch for any blood in your urine. Call your health care provider if the amount of blood in your urine increases.  If you have a catheter: ? Follow instructions from your health care provider about taking care of your catheter and collection bag. ? Do not take baths, swim, or use a hot tub until your health care provider approves.  Drink enough fluid to keep your urine clear or pale yellow.  Keep all follow-up visits as told by your health care provider. This is important. Contact a health care provider if:  You have pain that gets worse or does not get better with medicine, especially pain when you urinate.  You have difficulty urinating.  You feel nauseous or you vomit repeatedly during a period of more than 2 days after the procedure. Get help right away if:  Your urine is dark red or has blood clots in it.  You are leaking urine (have incontinence).  The end of the stent comes out of your urethra.  You cannot urinate.  You have sudden, sharp, or severe pain in your abdomen or lower back.  You have a fever. This information is not intended to replace advice given to you by your health care provider. Make sure you discuss any questions you have with your health care provider. Document Released: 08/08/2013 Document Revised: 05/13/2016 Document Reviewed: 06/20/2015 Elsevier Interactive Patient Education  [employer].

## 2017-12-23 NOTE — Anesthesia Procedure Notes (Signed)
Procedure Name: LMA Insertion Date/Time: 12/23/2017 7:43 AM Performed by: Dione Booze, CRNA Pre-anesthesia Checklist: Patient identified, Emergency Drugs available, Suction available and Patient being monitored Patient Re-evaluated:Patient Re-evaluated prior to induction Oxygen Delivery Method: Circle system utilized Preoxygenation: Pre-oxygenation with 100% oxygen Induction Type: IV induction LMA: LMA with gastric port inserted LMA Size: 4.0 Number of attempts: 1 Placement Confirmation: positive ETCO2 and breath sounds checked- equal and bilateral Tube secured with: Tape Dental Injury: Teeth and Oropharynx as per pre-operative assessment

## 2017-12-23 NOTE — Anesthesia Preprocedure Evaluation (Signed)
Anesthesia Evaluation  Patient identified by MRN, date of birth, ID band Patient awake    Reviewed: Allergy & Precautions, H&P , NPO status , Patient's Chart, lab work & pertinent test results  Airway Mallampati: II   Neck ROM: full    Dental   Pulmonary neg pulmonary ROS,    breath sounds clear to auscultation       Cardiovascular hypertension, + CAD, + Past MI and + CABG   Rhythm:regular Rate:Normal     Neuro/Psych    GI/Hepatic   Endo/Other    Renal/GU      Musculoskeletal  (+) Arthritis ,   Abdominal   Peds  Hematology   Anesthesia Other Findings   Reproductive/Obstetrics                             Anesthesia Physical Anesthesia Plan  ASA: III  Anesthesia Plan: General   Post-op Pain Management:    Induction: Intravenous  PONV Risk Score and Plan: 2 and Ondansetron and Treatment may vary due to age or medical condition  Airway Management Planned: LMA  Additional Equipment:   Intra-op Plan:   Post-operative Plan:   Informed Consent: I have reviewed the patients History and Physical, chart, labs and discussed the procedure including the risks, benefits and alternatives for the proposed anesthesia with the patient or authorized representative who has indicated his/her understanding and acceptance.     Plan Discussed with: CRNA, Anesthesiologist and Surgeon  Anesthesia Plan Comments:         Anesthesia Quick Evaluation

## 2017-12-27 DIAGNOSIS — R310 Gross hematuria: Secondary | ICD-10-CM | POA: Diagnosis not present

## 2017-12-27 DIAGNOSIS — N201 Calculus of ureter: Secondary | ICD-10-CM | POA: Diagnosis not present

## 2018-01-18 DIAGNOSIS — Z961 Presence of intraocular lens: Secondary | ICD-10-CM | POA: Diagnosis not present

## 2018-01-18 DIAGNOSIS — H02831 Dermatochalasis of right upper eyelid: Secondary | ICD-10-CM | POA: Diagnosis not present

## 2018-01-18 DIAGNOSIS — H26492 Other secondary cataract, left eye: Secondary | ICD-10-CM | POA: Diagnosis not present

## 2018-01-18 DIAGNOSIS — H02834 Dermatochalasis of left upper eyelid: Secondary | ICD-10-CM | POA: Diagnosis not present

## 2018-01-23 DIAGNOSIS — M4316 Spondylolisthesis, lumbar region: Secondary | ICD-10-CM | POA: Diagnosis not present

## 2018-01-23 DIAGNOSIS — M47816 Spondylosis without myelopathy or radiculopathy, lumbar region: Secondary | ICD-10-CM | POA: Diagnosis not present

## 2018-01-23 DIAGNOSIS — M48062 Spinal stenosis, lumbar region with neurogenic claudication: Secondary | ICD-10-CM | POA: Diagnosis not present

## 2018-01-23 DIAGNOSIS — M4155 Other secondary scoliosis, thoracolumbar region: Secondary | ICD-10-CM | POA: Diagnosis not present

## 2018-01-23 DIAGNOSIS — M5136 Other intervertebral disc degeneration, lumbar region: Secondary | ICD-10-CM | POA: Diagnosis not present

## 2018-01-25 ENCOUNTER — Other Ambulatory Visit: Payer: Self-pay

## 2018-01-25 ENCOUNTER — Ambulatory Visit: Payer: Medicare Other | Attending: Neurosurgery

## 2018-01-25 DIAGNOSIS — M6281 Muscle weakness (generalized): Secondary | ICD-10-CM | POA: Diagnosis not present

## 2018-01-25 DIAGNOSIS — R2689 Other abnormalities of gait and mobility: Secondary | ICD-10-CM

## 2018-01-25 NOTE — Patient Instructions (Addendum)
KNEE: Extension, Long Arc Quad (Weight)  Place weight around leg. Raise leg until knee is straight. Hold _5__ seconds. Use ___ lb weight. _10__ reps per set (each leg), 4-5__ sets per day, __7_ days per week    Knee Raise   Lift knee and then lower it. Repeat with other knee. Repeat _10__ times each leg. Do _4-5___ sessions per day.  http://gt2.exer.us/445   Copyright  VHI. All rights reserved.  Toe Up   Gently rise up on toes and back on heels. Repeat _20___ times. Do 4-5____ sessions per day.  [facility] [address], [address] [place], [place] [address] Phone # [phone] Fax [phone]  

## 2018-01-25 NOTE — Therapy (Signed)
[facility] [address], [place], [place], [address] Phone: [phone]   Fax:  [phone]  Physical Therapy Evaluation  Patient Details  Name: Larry [name] MRN: [id number] Date of Birth: 08-16-1932 Referring Provider: Jovita Gamma, MD   Encounter Date: 01/25/2018  PT End of Session - 01/25/18 1615    Visit Number  1    Date for PT Re-Evaluation  03/22/18    Authorization Type  Medicare    PT Start Time  5462    PT Stop Time  1616    PT Time Calculation (min)  45 min    Activity Tolerance  Patient tolerated treatment well    Behavior During Therapy  [facility] for tasks assessed/performed       Past Medical History:  Diagnosis Date  . Allergic rhinitis, cause unspecified 03/12/2013  . Allergy   . CAD (coronary artery disease)    S/P CABG in 1999   . Cataract   . Decreased exercise tolerance    Exertional fatigue  . Degenerative arthritis of hip   . Diverticulosis of colon   . ED (erectile dysfunction)   . Erectile dysfunction 06/03/2014  . History of colonic polyps   . History of kidney stones   . History of MI (myocardial infarction)   . Hx of adenomatous colonic polyps 2000  . Hyperlipidemia    Low HDL  . Lumbar disc disease    Hx of with recent back pain and buttock pain  . Myocardial infarction ([place]) 1999  . Right knee meniscal tear   . S/P removal of thyroid nodule   . Spinal stenosis of lumbar region 03/15/2014    Past Surgical History:  Procedure Laterality Date  . CARDIAC CATHETERIZATION    . CATARACT EXTRACTION    . COLONOSCOPY    . CORONARY ARTERY BYPASS GRAFT  1999  . CYSTOSCOPY/URETEROSCOPY/HOLMIUM LASER/STENT PLACEMENT Left 12/23/2017   Procedure: CYSTOSCOPY/RETROGRADE/URETEROSCOPY/HOLMIUM LASER/STENT PLACEMENT;  Surgeon: Festus Aloe, MD;  Location: [facility];  Service: Urology;  Laterality: Left;  ONLY NEEDS 60 MIN  . LUMBAR DISC SURGERY    . Nodules on Thyroid    . POLYPECTOMY     . PTCA    . SHOULDER ARTHROSCOPY WITH ROTATOR CUFF REPAIR AND SUBACROMIAL DECOMPRESSION Right 01/30/2015   Procedure: RIGHT ARTHROSCOPY SHOULDER SUBACROMIAL DECOMPRESSION DISTAL CLAVICAL RESECTION RETATOR CUFF REPAIR;  Surgeon: Justice Britain, MD;  Location: [facility];  Service: Orthopedics;  Laterality: Right;  . TONSILLECTOMY    . VASECTOMY      There were no vitals filed for this visit.   Subjective Assessment - 01/25/18 1531    Subjective  Pt presents to PT with complaints of limited endurance and increased fatigue with standing and walking.  Pt denies that he doesn't have significant pain but is most bothered by the limited ability to stand and walk.  Recent imaging showed L3-4 and L4-5 and spondylolisthesis at L3-4 and L4-5.  Pt has had extensive PT in the past to address strength, endurance and balance.      Patient is accompained by:  Family member    Limitations  Standing;Walking    How long can you stand comfortably?  10 minutes    How long can you walk comfortably?  5 minutes with cane    Diagnostic tests  Lumbar stenosis at L3-4 and L4-5, degenerative spondylolisthesis L3-4 and L4-5    Patient Stated Goals  improve endurance for standing and walking    Currently in Pain?  No/denies         [facility] PT Assessment - 01/25/18 0001      Assessment   Medical Diagnosis  spinal stenosis, lumbar region with neurogenic claudication    Referring Provider  Jovita Gamma, MD    Onset Date/Surgical Date  07/26/15    Next MD Visit  2 months    Prior Therapy  PT in [facility]/2017      Precautions   Precautions  Fall      Restrictions   Weight Bearing Restrictions  No      Balance Screen   Has the patient fallen in the past 6 months  No    Has the patient had a decrease in activity level because of a fear of falling?   No    Is the patient reluctant to leave their home because of a fear of falling?   No      Home Environment   Living Environment  Private residence    Living  Arrangements  Spouse/significant other    Type of [place] to enter    Entrance Stairs-Number of Steps  [place] to live on main level with bedroom/bathroom;Two level    [place] - 4 wheels;Cane - single point;Shower seat      Prior Function   Level of Independence  Independent    Vocation  Retired    Leisure  walking       Cognition   Overall Cognitive Status  Within Functional Limits for tasks assessed      Posture/Postural Control   Posture/Postural Control  Postural limitations    Postural Limitations  Forward head;Rounded Shoulders;Flexed trunk;Decreased lumbar lordosis      ROM / Strength   AROM / PROM / Strength  AROM;PROM;Strength      AROM   Overall AROM   Within functional limits for tasks performed      PROM   Overall PROM   Within functional limits for tasks performed      Strength   Overall Strength  Deficits    Strength Assessment Site  Ankle;Knee;Hip    Right/Left Hip  Right;Left    Right Hip Flexion  4/5    Right Hip ABduction  4/5    Left Hip Flexion  4+/5    Left Hip ABduction  4/5    Right/Left Knee  Right;Left    Right Knee Flexion  4/5    Right Knee Extension  4/5    Left Knee Flexion  4+/5    Left Knee Extension  4+/5    Right/Left Ankle  Right;Left    Right Ankle Dorsiflexion  4-/5    Left Ankle Dorsiflexion  4/5      Transfers   Transfers  Sit to Stand;Stand to Sit    Sit to Stand  7: Independent    Five time sit to stand comments   10.6 seconds    Stand to Sit  7: Independent      Ambulation/Gait   Ambulation/Gait  Yes    Ambulation/Gait Assistance  6: Modified independent (Device/Increase time)    Assistive device  Straight cane    Gait Pattern  Step-through pattern;Decreased step length - right;Decreased step length - left;Decreased stride length;Antalgic;Poor foot clearance - right    Ambulation Surface  Level    Gait Comments  Pt with poor foot clearance Rt>Lt especially with  fatigue  6 Minute Walk- Baseline   6 Minute Walk- Baseline  yes    Perceived Rate of Exertion (Borg)  13- Somewhat hard      6 Minute walk- Post Test   6 Minute Walk Post Test  no      6 minute walk test results    Aerobic Endurance Distance Walked  840    Endurance additional comments  antalgia and fatigue             Objective measurements completed on examination: See above findings.              PT Education - 01/25/18 1614    Education provided  Yes    Education Details  seated LE strength    Person(s) Educated  Patient    Methods  Explanation;Handout;Demonstration    Comprehension  Verbalized understanding;Returned demonstration       PT Short Term Goals - 01/25/18 1545      PT SHORT TERM GOAL #1   Title  be independent in initial HEP    Time  4    Period  Weeks    Status  New    Target Date  02/22/18      PT SHORT TERM GOAL #2   Title  improve LE strength and endurance to stand for 15-20 minutes without rest    Time  4    Period  Weeks    Status  New    Target Date  02/22/18      PT SHORT TERM GOAL #3   Title  improve 6 minute walk test to ambulate 750 feet     Time  4    Period  Weeks    Status  New    Target Date  02/22/18      PT SHORT TERM GOAL #4   Title  demonstrate bilateral heel strike and normalized step length on level surface to improve safety    Time  4    Period  Weeks    Status  New    Target Date  02/22/18        PT Long Term Goals - 01/25/18 1546      PT LONG TERM GOAL #1   Title  be independent in advanced HEP    Time  8    Period  Weeks    Status  New    Target Date  03/22/18      PT LONG TERM GOAL #2   Title  reduce FOTO to < or = to 41% limitation    Time  8    Period  Weeks    Status  New    Target Date  03/22/18      PT LONG TERM GOAL #3   Title  improve LE strength and endurance to walk for 15 minutes in the community without need to rest    Time  8    Period  Weeks    Status  New     Target Date  03/22/18      PT LONG TERM GOAL #4   Title  improve 6 minute walk test to ambulate 800 feet     Time  8    Period  Weeks    Status  New    Target Date  03/22/18      PT LONG TERM GOAL #5   Title  demonstrate 4+/5 bil LE strength to improve safety and endurance on unlevel surfaces    Time  8    Period  Weeks    Status  New    Target Date  03/22/18             Plan - 01/25/18 1624    Clinical Impression Statement  Pt presents to PT with diagnosis of spinal stenosis with neurogenic claudication with main compliant of limited endurance and mobility.  Pt had lumbar surgery in 1964 and reports that he herniated discs at multiple lumbar levels ~3 years ago.  Pt has had a steady decline in strength, endurance for standing and balance since this time.  Pt does not want surgery due to age and other comorbidities.  Pt has had success in PT with improving endurance and strength.  Pt had recent decline in endurance due to kidney stone surgery in January 2019.  Pt demonstrates shortened step length bilaterally, reduced heet strike Rt>Lt, and reduced time spent in stance on Rt.  Pt ambulated 840 feet in 6 minutes with a 13 on Borg perceived rate of exertion.  Pt with Rt>Lt hip, knee and ankle weakness.  FOTO score is 50% limitation.  Pt will benefit from skilled PT to improve strength, endurance and gait to improve safety and endurance at home and in the community.    History and Personal Factors relevant to plan of care:  spinal stenosis, recent kidney stone surgery, Rt LE weakness    Clinical Presentation  Evolving    Clinical Presentation due to:  steady decline over 3 years, recent kidney stone with 1 month of immobility prior to surgery    Clinical Decision Making  Moderate    Rehab Potential  Good    PT Frequency  2x / week    PT Duration  8 weeks    PT Treatment/Interventions  ADLs/Self Care Home Management;Cryotherapy;Moist Heat;Balance training;Therapeutic exercise;Functional  mobility training;Therapeutic activities;Gait training;Stair training;Neuromuscular re-education;Patient/family education;Passive range of motion;Manual techniques    PT Next Visit Plan  endurance, LE strength, balance, gait    Consulted and Agree with Plan of Care  Patient       Patient will benefit from skilled therapeutic intervention in order to improve the following deficits and impairments:  Abnormal gait, Difficulty walking, Decreased balance, Decreased activity tolerance, Decreased mobility, Decreased coordination, Postural dysfunction  Visit Diagnosis: Muscle weakness (generalized) - Plan: PT plan of care cert/re-cert  Other abnormalities of gait and mobility - Plan: PT plan of care cert/re-cert     Problem List Patient Active Problem List   Diagnosis Date Noted  . Gait disorder 04/20/2016  . Hyperglycemia 04/20/2016  . Abnormal CT scan, chest 06/03/2014  . Erectile dysfunction 06/03/2014  . Spinal stenosis of lumbar region 03/15/2014  . Cough 03/12/2013  . Allergic rhinitis 03/12/2013  . Leg pain, bilateral 12/30/2011  . Bladder neck obstruction 12/30/2011  . Preventative health care 12/25/2011  . Lumbar disc disease 12/25/2011  . S/P removal of thyroid nodule 12/25/2011  . History of MI (myocardial infarction) 12/25/2011  . Right knee meniscal tear 12/25/2011  . Degenerative arthritis of hip 12/25/2011  . HIP PAIN, RIGHT 05/14/2010  . FATIGUE 10/13/2009  . HYPERTENSION, BENIGN 02/25/2009  . CAD, AUTOLOGOUS BYPASS GRAFT 02/25/2009  . CORONARY ARTERY ANEURYSM 02/25/2009  . BACK PAIN 04/15/2008  . Hyperlipidemia 02/13/2008  . CORONARY ARTERY DISEASE 02/13/2008  . DIVERTICULOSIS, COLON 02/13/2008  . CARDIAC DISEASE, HX OF 02/13/2008  . COLONIC POLYPS, HX OF 02/13/2008     Sigurd Sos, PT 01/25/18 4:41 PM  [facility] [address], [place], [place], [address] Phone: [phone]   Fax:   [phone]  Name: Larry [name] MRN: [id number] Date of Birth: May 06, 1932

## 2018-01-26 DIAGNOSIS — Z961 Presence of intraocular lens: Secondary | ICD-10-CM | POA: Diagnosis not present

## 2018-01-26 DIAGNOSIS — H26492 Other secondary cataract, left eye: Secondary | ICD-10-CM | POA: Diagnosis not present

## 2018-01-30 ENCOUNTER — Encounter: Payer: Self-pay | Admitting: Physical Therapy

## 2018-01-30 ENCOUNTER — Ambulatory Visit: Payer: Medicare Other | Admitting: Physical Therapy

## 2018-01-30 DIAGNOSIS — M6281 Muscle weakness (generalized): Secondary | ICD-10-CM | POA: Diagnosis not present

## 2018-01-30 DIAGNOSIS — R2689 Other abnormalities of gait and mobility: Secondary | ICD-10-CM

## 2018-01-30 NOTE — Patient Instructions (Signed)
   STANDING HEEL RAISES  While standing, raise up on your toes as you lift your heels off the ground. x10 reps, complete 2 sets      TOES RAISES - DORSIFLEXION STANDING  In a standing position with your feet on the ground, raise up your forefoot and toes as you bend at your ankle.  x10 reps, complete 2 sets       SIT TO STAND - NO SUPPORT  Start by scooting close to the front of the chair.  Next, lean forward at your trunk and reach forward with your arms and rise to standing without using your hands to push off from the chair or other object.   Use your arms as a counter-balance by reaching forward when in sitting and lower them as you approach standing. Every time you go to stand, complete 5 reps. Shift the left leg forward to make sure more weight is on the right leg.    [facility] [address] [place], [place] [address] Phone # [phone] Fax [phone]

## 2018-01-30 NOTE — Therapy (Signed)
[facility] [address] [place], [place], [address] Phone: [phone]   Fax:  [phone]  Physical Therapy Treatment  Patient Details  Name: Larry [name] MRN: [id number] Date of Birth: 26-Jun-1932 Referring Provider: Jovita Gamma, MD   Encounter Date: 01/30/2018  PT End of Session - 01/30/18 0939    Visit Number  2    Date for PT Re-Evaluation  03/22/18    Authorization Type  Medicare    PT Start Time  0933    PT Stop Time  5188    PT Time Calculation (min)  41 min    Activity Tolerance  Patient tolerated treatment well;No increased pain    Behavior During Therapy  WFL for tasks assessed/performed       Past Medical History:  Diagnosis Date  . Allergic rhinitis, cause unspecified 03/12/2013  . Allergy   . CAD (coronary artery disease)    S/P CABG in 1999   . Cataract   . Decreased exercise tolerance    Exertional fatigue  . Degenerative arthritis of hip   . Diverticulosis of colon   . ED (erectile dysfunction)   . Erectile dysfunction 06/03/2014  . History of colonic polyps   . History of kidney stones   . History of MI (myocardial infarction)   . Hx of adenomatous colonic polyps 2000  . Hyperlipidemia    Low HDL  . Lumbar disc disease    Hx of with recent back pain and buttock pain  . Myocardial infarction ([place]) 1999  . Right knee meniscal tear   . S/P removal of thyroid nodule   . Spinal stenosis of lumbar region 03/15/2014    Past Surgical History:  Procedure Laterality Date  . CARDIAC CATHETERIZATION    . CATARACT EXTRACTION    . COLONOSCOPY    . CORONARY ARTERY BYPASS GRAFT  1999  . CYSTOSCOPY/URETEROSCOPY/HOLMIUM LASER/STENT PLACEMENT Left 12/23/2017   Procedure: CYSTOSCOPY/RETROGRADE/URETEROSCOPY/HOLMIUM LASER/STENT PLACEMENT;  Surgeon: Festus Aloe, MD;  Location: [facility];  Service: Urology;  Laterality: Left;  ONLY NEEDS 60 MIN  . LUMBAR DISC SURGERY    . Nodules on Thyroid     . POLYPECTOMY    . PTCA    . SHOULDER ARTHROSCOPY WITH ROTATOR CUFF REPAIR AND SUBACROMIAL DECOMPRESSION Right 01/30/2015   Procedure: RIGHT ARTHROSCOPY SHOULDER SUBACROMIAL DECOMPRESSION DISTAL CLAVICAL RESECTION RETATOR CUFF REPAIR;  Surgeon: Justice Britain, MD;  Location: [facility];  Service: Orthopedics;  Laterality: Right;  . TONSILLECTOMY    . VASECTOMY      There were no vitals filed for this visit.  Subjective Assessment - 01/30/18 0938    Subjective  Pt reports things are going well. He has been completing his HEP at home without any issues. No pain currently.     Patient is accompained by:  Family member    Limitations  Standing;Walking    How long can you stand comfortably?  10 minutes    How long can you walk comfortably?  5 minutes with cane    Diagnostic tests  Lumbar stenosis at L3-4 and L4-5, degenerative spondylolisthesis L3-4 and L4-5    Patient Stated Goals  improve endurance for standing and walking    Currently in Pain?  No/denies                      [facility] Adult PT Treatment/Exercise - 01/30/18 0001      Exercises   Exercises  Lumbar  Lumbar Exercises: Standing   Heel Raises  15 reps    Heel Raises Limitations  toe raises x15 reps     Other Standing Lumbar Exercises  sidestepping along countertop x2 trips down/back with yellow TB  increased difficulty LLE    Other Standing Lumbar Exercises  hip extension with forearms resting on countertop x15 reps each;  alternating LE marching x10 reps with 1 UE support x10 reps, progressed to standing with UE/LE marching at wall x10 reps.       Lumbar Exercises: Seated   Sit to Stand  10 reps;Limitations    Sit to Stand Limitations  x2 sets mirror feedback and therapist instruction for weight shift       Lumbar Exercises: Supine   Isometric Hip Flexion  15 reps;2 seconds      Lumbar Exercises: Sidelying   Clam  Both;15 reps    Clam Limitations  green TB          Balance Exercises - 01/30/18 1015       Balance Exercises: Standing   Standing Eyes Opened  Narrow base of support (BOS);Foam/compliant surface;Other (comment) trunk rotation Lt/Rt x10 reps     Tandem Stance  Eyes open;2 reps;30 secs;Intermittent upper extremity support        PT Education - 01/30/18 1015    Education provided  Yes    Education Details  updates to HEP; technique with sit to stand     Person(s) Educated  Patient    Methods  Explanation;Handout    Comprehension  Verbalized understanding;Returned demonstration       PT Short Term Goals - 01/30/18 1020      PT SHORT TERM GOAL #1   Title  be independent in initial HEP    Time  4    Period  Weeks    Status  Achieved      PT SHORT TERM GOAL #2   Title  improve LE strength and endurance to stand for 15-20 minutes without rest    Time  4    Period  Weeks    Status  New      PT SHORT TERM GOAL #3   Title  improve 6 minute walk test to ambulate 750 feet     Time  4    Period  Weeks    Status  New      PT SHORT TERM GOAL #4   Title  demonstrate bilateral heel strike and normalized step length on level surface to improve safety    Time  4    Period  Weeks    Status  New        PT Long Term Goals - 01/25/18 1546      PT LONG TERM GOAL #1   Title  be independent in advanced HEP    Time  8    Period  Weeks    Status  New    Target Date  03/22/18      PT LONG TERM GOAL #2   Title  reduce FOTO to < or = to 41% limitation    Time  8    Period  Weeks    Status  New    Target Date  03/22/18      PT LONG TERM GOAL #3   Title  improve LE strength and endurance to walk for 15 minutes in the community without need to rest    Time  8    Period  Weeks  Status  New    Target Date  03/22/18      PT LONG TERM GOAL #4   Title  improve 6 minute walk test to ambulate 800 feet     Time  8    Period  Weeks    Status  New    Target Date  03/22/18      PT LONG TERM GOAL #5   Title  demonstrate 4+/5 bil LE strength to improve safety and  endurance on unlevel surfaces    Time  8    Period  Weeks    Status  New    Target Date  03/22/18            Plan - 01/30/18 1015    Clinical Impression Statement  Pt arrived today reporting consistent HEP adherence since his evaluation 5 days ago. Session focused on LE strengthening with addition of standing balance activity to improve pt's activity completion at home. Pt did require therapist cuing and assistance with several standing activities due to noted muscle shaking and fatigue in the LEs, particularly with marching activity. Also noted weight shift onto the Lt with sit to stand activity which required visual and verbal feedback to improve. Pt reported no increase in pain end of session, but did note LE fatigue. Will continue with current POC.     Rehab Potential  Good    PT Frequency  2x / week    PT Duration  8 weeks    PT Treatment/Interventions  ADLs/Self Care Home Management;Cryotherapy;Moist Heat;Balance training;Therapeutic exercise;Functional mobility training;Therapeutic activities;Gait training;Stair training;Neuromuscular re-education;Patient/family education;Passive range of motion;Manual techniques    PT Next Visit Plan  progress endurance, LE strength, balance, gait; f/u on sit to stand technique     PT Home Exercise Plan  added sit to stand, standing heel raises/toe raises    Consulted and Agree with Plan of Care  Patient       Patient will benefit from skilled therapeutic intervention in order to improve the following deficits and impairments:  Abnormal gait, Difficulty walking, Decreased balance, Decreased activity tolerance, Decreased mobility, Decreased coordination, Postural dysfunction  Visit Diagnosis: Muscle weakness (generalized)  Other abnormalities of gait and mobility     Problem List Patient Active Problem List   Diagnosis Date Noted  . Gait disorder 04/20/2016  . Hyperglycemia 04/20/2016  . Abnormal CT scan, chest 06/03/2014  . Erectile  dysfunction 06/03/2014  . Spinal stenosis of lumbar region 03/15/2014  . Cough 03/12/2013  . Allergic rhinitis 03/12/2013  . Leg pain, bilateral 12/30/2011  . Bladder neck obstruction 12/30/2011  . Preventative health care 12/25/2011  . Lumbar disc disease 12/25/2011  . S/P removal of thyroid nodule 12/25/2011  . History of MI (myocardial infarction) 12/25/2011  . Right knee meniscal tear 12/25/2011  . Degenerative arthritis of hip 12/25/2011  . HIP PAIN, RIGHT 05/14/2010  . FATIGUE 10/13/2009  . HYPERTENSION, BENIGN 02/25/2009  . CAD, AUTOLOGOUS BYPASS GRAFT 02/25/2009  . CORONARY ARTERY ANEURYSM 02/25/2009  . BACK PAIN 04/15/2008  . Hyperlipidemia 02/13/2008  . CORONARY ARTERY DISEASE 02/13/2008  . DIVERTICULOSIS, COLON 02/13/2008  . CARDIAC DISEASE, HX OF 02/13/2008  . COLONIC POLYPS, HX OF 02/13/2008   10:21 AM,01/30/18 Sherol Dade PT, DPT [place] [facility] [address], [place], [place], [address] Phone: [phone]   Fax:  [phone]  Name: Vinay Ertl MRN: [id number] Date of Birth: [birth year]

## 2018-02-01 ENCOUNTER — Ambulatory Visit: Payer: Medicare Other

## 2018-02-01 DIAGNOSIS — R2689 Other abnormalities of gait and mobility: Secondary | ICD-10-CM

## 2018-02-01 DIAGNOSIS — M6281 Muscle weakness (generalized): Secondary | ICD-10-CM | POA: Diagnosis not present

## 2018-02-01 NOTE — Therapy (Signed)
[facility] [address] [place], [place], [address] Phone: [phone]   Fax:  [phone]  Physical Therapy Treatment  Patient Details  Name: Larry [name] MRN: [id number] Date of Birth: June 19, 1932 Referring Provider: Jovita Gamma, MD   Encounter Date: 02/01/2018  PT End of Session - 02/01/18 1612    Visit Number  3    Date for PT Re-Evaluation  03/22/18    Authorization Type  Medicare    PT Start Time  4944    PT Stop Time  1612    PT Time Calculation (min)  41 min    Activity Tolerance  Patient tolerated treatment well    Behavior During Therapy  [facility] for tasks assessed/performed       Past Medical History:  Diagnosis Date  . Allergic rhinitis, cause unspecified 03/12/2013  . Allergy   . CAD (coronary artery disease)    S/P CABG in 1999   . Cataract   . Decreased exercise tolerance    Exertional fatigue  . Degenerative arthritis of hip   . Diverticulosis of colon   . ED (erectile dysfunction)   . Erectile dysfunction 06/03/2014  . History of colonic polyps   . History of kidney stones   . History of MI (myocardial infarction)   . Hx of adenomatous colonic polyps 2000  . Hyperlipidemia    Low HDL  . Lumbar disc disease    Hx of with recent back pain and buttock pain  . Myocardial infarction ([place]) 1999  . Right knee meniscal tear   . S/P removal of thyroid nodule   . Spinal stenosis of lumbar region 03/15/2014    Past Surgical History:  Procedure Laterality Date  . CARDIAC CATHETERIZATION    . CATARACT EXTRACTION    . COLONOSCOPY    . CORONARY ARTERY BYPASS GRAFT  1999  . CYSTOSCOPY/URETEROSCOPY/HOLMIUM LASER/STENT PLACEMENT Left 12/23/2017   Procedure: CYSTOSCOPY/RETROGRADE/URETEROSCOPY/HOLMIUM LASER/STENT PLACEMENT;  Surgeon: Festus Aloe, MD;  Location: [facility];  Service: Urology;  Laterality: Left;  ONLY NEEDS 60 MIN  . LUMBAR DISC SURGERY    . Nodules on Thyroid    . POLYPECTOMY     . PTCA    . SHOULDER ARTHROSCOPY WITH ROTATOR CUFF REPAIR AND SUBACROMIAL DECOMPRESSION Right 01/30/2015   Procedure: RIGHT ARTHROSCOPY SHOULDER SUBACROMIAL DECOMPRESSION DISTAL CLAVICAL RESECTION RETATOR CUFF REPAIR;  Surgeon: Justice Britain, MD;  Location: [facility];  Service: Orthopedics;  Laterality: Right;  . TONSILLECTOMY    . VASECTOMY      There were no vitals filed for this visit.  Subjective Assessment - 02/01/18 1535    Subjective  I am doing my HEP regularly.      Currently in Pain?  No/denies                      [facility] Adult PT Treatment/Exercise - 02/01/18 0001      Exercises   Exercises  Knee/Hip      Lumbar Exercises: Stretches   Active Hamstring Stretch  3 reps;20 seconds;Left;Right      Lumbar Exercises: Standing   Heel Raises  15 reps    Other Standing Lumbar Exercises  sidestepping along countertop x2 trips down/back  increased difficulty LLE      Lumbar Exercises: Seated   Sit to Stand  10 reps;Limitations    Sit to Stand Limitations  x2 sets seated on black pad mirror feedback and therapist instruction for weight shift  Lumbar Exercises: Sidelying   Clam  Both;15 reps    Clam Limitations  green TB      Knee/Hip Exercises: Aerobic   Nustep  Level 2x 8 minutes PT present to discuss progress       Knee/Hip Exercises: Standing   Rocker Board  3 minutes    Rebounder  --      Knee/Hip Exercises: Seated   Long Arc Quad  Strengthening;Both;2 sets;10 reps;Weights    Long Arc Quad Weight  2 lbs.    Marching  Strengthening;Both;2 sets;10 reps    Marching Limitations  1.5# added               PT Short Term Goals - 01/30/18 1020      PT SHORT TERM GOAL #1   Title  be independent in initial HEP    Time  4    Period  Weeks    Status  Achieved      PT SHORT TERM GOAL #2   Title  improve LE strength and endurance to stand for 15-20 minutes without rest    Time  4    Period  Weeks    Status  New      PT SHORT TERM GOAL #3    Title  improve 6 minute walk test to ambulate 750 feet     Time  4    Period  Weeks    Status  New      PT SHORT TERM GOAL #4   Title  demonstrate bilateral heel strike and normalized step length on level surface to improve safety    Time  4    Period  Weeks    Status  New        PT Long Term Goals - 01/25/18 1546      PT LONG TERM GOAL #1   Title  be independent in advanced HEP    Time  8    Period  Weeks    Status  New    Target Date  03/22/18      PT LONG TERM GOAL #2   Title  reduce FOTO to < or = to 41% limitation    Time  8    Period  Weeks    Status  New    Target Date  03/22/18      PT LONG TERM GOAL #3   Title  improve LE strength and endurance to walk for 15 minutes in the community without need to rest    Time  8    Period  Weeks    Status  New    Target Date  03/22/18      PT LONG TERM GOAL #4   Title  improve 6 minute walk test to ambulate 800 feet     Time  8    Period  Weeks    Status  New    Target Date  03/22/18      PT LONG TERM GOAL #5   Title  demonstrate 4+/5 bil LE strength to improve safety and endurance on unlevel surfaces    Time  8    Period  Weeks    Status  New    Target Date  03/22/18            Plan - 02/01/18 1547    Clinical Impression Statement  Pt is independent and compliant with HEP.  Pt continues to demonsrate gait instability, LE weakness and difficulty and increased instabiltiy  with transitions.  PT focused session on general endurance and standing balance activities.  Pt required cueing and supervision for standing balance exercises.  Pt will continue to benefit from skilled PT for LE strength, endurance and balance and incresaed safety at home and in the community.      Rehab Potential  Good    PT Frequency  2x / week    PT Duration  8 weeks    PT Treatment/Interventions  ADLs/Self Care Home Management;Cryotherapy;Moist Heat;Balance training;Therapeutic exercise;Functional mobility training;Therapeutic  activities;Gait training;Stair training;Neuromuscular re-education;Patient/family education;Passive range of motion;Manual techniques    PT Next Visit Plan  progress endurance, LE strength, balance, gait    Consulted and Agree with Plan of Care  Patient       Patient will benefit from skilled therapeutic intervention in order to improve the following deficits and impairments:  Abnormal gait, Difficulty walking, Decreased balance, Decreased activity tolerance, Decreased mobility, Decreased coordination, Postural dysfunction  Visit Diagnosis: Muscle weakness (generalized)  Other abnormalities of gait and mobility     Problem List Patient Active Problem List   Diagnosis Date Noted  . Gait disorder 04/20/2016  . Hyperglycemia 04/20/2016  . Abnormal CT scan, chest 06/03/2014  . Erectile dysfunction 06/03/2014  . Spinal stenosis of lumbar region 03/15/2014  . Cough 03/12/2013  . Allergic rhinitis 03/12/2013  . Leg pain, bilateral 12/30/2011  . Bladder neck obstruction 12/30/2011  . Preventative health care 12/25/2011  . Lumbar disc disease 12/25/2011  . S/P removal of thyroid nodule 12/25/2011  . History of MI (myocardial infarction) 12/25/2011  . Right knee meniscal tear 12/25/2011  . Degenerative arthritis of hip 12/25/2011  . HIP PAIN, RIGHT 05/14/2010  . FATIGUE 10/13/2009  . HYPERTENSION, BENIGN 02/25/2009  . CAD, AUTOLOGOUS BYPASS GRAFT 02/25/2009  . CORONARY ARTERY ANEURYSM 02/25/2009  . BACK PAIN 04/15/2008  . Hyperlipidemia 02/13/2008  . CORONARY ARTERY DISEASE 02/13/2008  . DIVERTICULOSIS, COLON 02/13/2008  . CARDIAC DISEASE, HX OF 02/13/2008  . COLONIC POLYPS, HX OF 02/13/2008     Sigurd Sos, PT 02/01/18 4:14 PM  [facility] [address] [place], [place], [address] Phone: [phone]   Fax:  [phone]  Name: Larry [name] MRN: [id number] Date of Birth: 1932-05-30

## 2018-02-06 ENCOUNTER — Ambulatory Visit: Payer: Medicare Other | Admitting: Physical Therapy

## 2018-02-06 ENCOUNTER — Encounter: Payer: Self-pay | Admitting: Internal Medicine

## 2018-02-06 DIAGNOSIS — M6281 Muscle weakness (generalized): Secondary | ICD-10-CM | POA: Diagnosis not present

## 2018-02-06 DIAGNOSIS — R2689 Other abnormalities of gait and mobility: Secondary | ICD-10-CM

## 2018-02-06 NOTE — Patient Instructions (Addendum)
   Tandem Stance  "In a corner, practice standing "heel to toe" with EYES OPEN.  (One foot in front of the other with the heel of one foot touching the toe of the other foot) The goal is to stand for the entire time without touching the wall. If this is too hard at first, try standing "almost heel to toe" (with feet touching at big toes to the inside of your ankle)."    Try to hold 30 sec, repeat 2-3x each leg forward.  Eyes open and eyes closed.    [facility] [address], [place], [place] [address] Phone # [phone] Fax [phone]

## 2018-02-06 NOTE — Therapy (Signed)
[facility] [address], [place], [place], [address] Phone: [phone]   Fax:  [phone]  Physical Therapy Treatment  Patient Details  Name: Larry [name] MRN: [id number] Date of Birth: Mar 26, 1932 Referring Provider: Jovita Gamma, MD   Encounter Date: 02/06/2018  PT End of Session - 02/06/18 1227    Visit Number  4    Date for PT Re-Evaluation  03/22/18    Authorization Type  Medicare    PT Start Time  3016    PT Stop Time  1226    PT Time Calculation (min)  41 min    Activity Tolerance  Patient tolerated treatment well;No increased pain    Behavior During Therapy  WFL for tasks assessed/performed       Past Medical History:  Diagnosis Date  . Allergic rhinitis, cause unspecified 03/12/2013  . Allergy   . CAD (coronary artery disease)    S/P CABG in 1999   . Cataract   . Decreased exercise tolerance    Exertional fatigue  . Degenerative arthritis of hip   . Diverticulosis of colon   . ED (erectile dysfunction)   . Erectile dysfunction 06/03/2014  . History of colonic polyps   . History of kidney stones   . History of MI (myocardial infarction)   . Hx of adenomatous colonic polyps 2000  . Hyperlipidemia    Low HDL  . Lumbar disc disease    Hx of with recent back pain and buttock pain  . Myocardial infarction ([place]) 1999  . Right knee meniscal tear   . S/P removal of thyroid nodule   . Spinal stenosis of lumbar region 03/15/2014    Past Surgical History:  Procedure Laterality Date  . CARDIAC CATHETERIZATION    . CATARACT EXTRACTION    . COLONOSCOPY    . CORONARY ARTERY BYPASS GRAFT  1999  . CYSTOSCOPY/URETEROSCOPY/HOLMIUM LASER/STENT PLACEMENT Left 12/23/2017   Procedure: CYSTOSCOPY/RETROGRADE/URETEROSCOPY/HOLMIUM LASER/STENT PLACEMENT;  Surgeon: Festus Aloe, MD;  Location: [facility];  Service: Urology;  Laterality: Left;  ONLY NEEDS 60 MIN  . LUMBAR DISC SURGERY    . Nodules on Thyroid     . POLYPECTOMY    . PTCA    . SHOULDER ARTHROSCOPY WITH ROTATOR CUFF REPAIR AND SUBACROMIAL DECOMPRESSION Right 01/30/2015   Procedure: RIGHT ARTHROSCOPY SHOULDER SUBACROMIAL DECOMPRESSION DISTAL CLAVICAL RESECTION RETATOR CUFF REPAIR;  Surgeon: Justice Britain, MD;  Location: [facility];  Service: Orthopedics;  Laterality: Right;  . TONSILLECTOMY    . VASECTOMY      There were no vitals filed for this visit.  Subjective Assessment - 02/06/18 1147    Subjective  Pt reports that things are going well. He forgot his cane this morning, so he is trying to take things a little easier to avoid falling.     Currently in Pain?  No/denies                      [facility] Adult PT Treatment/Exercise - 02/06/18 0001      Lumbar Exercises: Aerobic   Nustep  intervals from L1 to L4 every minute x6 min PT present to discuss session and adjust interval      Lumbar Exercises: Standing   Heel Raises  15 reps    Heel Raises Limitations  weight shift Rt       Lumbar Exercises: Seated   Sit to Stand  10 reps    Sit to Stand Limitations  standing on foam  pad, x1 set without UE support and LLE forward to encourage weight shift onto Rt     Other Seated Lumbar Exercises  circuit x4 rounds: alternating 5x sit to stand and standing LE taps without UE support (30 sec)            Balance Exercises - 02/06/18 1151      Balance Exercises: Standing   Standing Eyes Opened  Narrow base of support (BOS)    Tandem Stance  Eyes open;Eyes closed;2 reps;30 secs;Intermittent upper extremity support cues to breath    Tandem Gait  3 reps;Forward    Cone Rotation Limitations  seated cone rotation x10 reps each (0 UE support)  therapist cuing for proper muscle activation        PT Education - 02/06/18 1226    Education provided  Yes    Education Details  HEP updates    Person(s) Educated  Patient    Methods  Explanation;Handout    Comprehension  Verbalized understanding;Returned demonstration       PT  Short Term Goals - 01/30/18 1020      PT SHORT TERM GOAL #1   Title  be independent in initial HEP    Time  4    Period  Weeks    Status  Achieved      PT SHORT TERM GOAL #2   Title  improve LE strength and endurance to stand for 15-20 minutes without rest    Time  4    Period  Weeks    Status  New      PT SHORT TERM GOAL #3   Title  improve 6 minute walk test to ambulate 750 feet     Time  4    Period  Weeks    Status  New      PT SHORT TERM GOAL #4   Title  demonstrate bilateral heel strike and normalized step length on level surface to improve safety    Time  4    Period  Weeks    Status  New        PT Long Term Goals - 01/25/18 1546      PT LONG TERM GOAL #1   Title  be independent in advanced HEP    Time  8    Period  Weeks    Status  New    Target Date  03/22/18      PT LONG TERM GOAL #2   Title  reduce FOTO to < or = to 41% limitation    Time  8    Period  Weeks    Status  New    Target Date  03/22/18      PT LONG TERM GOAL #3   Title  improve LE strength and endurance to walk for 15 minutes in the community without need to rest    Time  8    Period  Weeks    Status  New    Target Date  03/22/18      PT LONG TERM GOAL #4   Title  improve 6 minute walk test to ambulate 800 feet     Time  8    Period  Weeks    Status  New    Target Date  03/22/18      PT LONG TERM GOAL #5   Title  demonstrate 4+/5 bil LE strength to improve safety and endurance on unlevel surfaces    Time  8  Period  Weeks    Status  New    Target Date  03/22/18            Plan - 02/06/18 1228    Clinical Impression Statement  Pt continues to do well in therapy. Began with intervals to encourage LE endurance and completed circuits of sit to stand, noting improved weight distribution overall. Pt did have increased difficulty when unstable surface was added to seated and standing activity, but overall no LOB was noted and tandem practice with his spouse was added to his  HEP. Will continue with current POC.     Rehab Potential  Good    PT Frequency  2x / week    PT Duration  8 weeks    PT Treatment/Interventions  ADLs/Self Care Home Management;Cryotherapy;Moist Heat;Balance training;Therapeutic exercise;Functional mobility training;Therapeutic activities;Gait training;Stair training;Neuromuscular re-education;Patient/family education;Passive range of motion;Manual techniques    PT Next Visit Plan  progress endurance, LE strength, balance, gait    PT Home Exercise Plan  added sit to stand, standing heel raises/toe raises, tandem (EO/EC)    Consulted and Agree with Plan of Care  Patient       Patient will benefit from skilled therapeutic intervention in order to improve the following deficits and impairments:  Abnormal gait, Difficulty walking, Decreased balance, Decreased activity tolerance, Decreased mobility, Decreased coordination, Postural dysfunction  Visit Diagnosis: Muscle weakness (generalized)  Other abnormalities of gait and mobility     Problem List Patient Active Problem List   Diagnosis Date Noted  . Gait disorder 04/20/2016  . Hyperglycemia 04/20/2016  . Abnormal CT scan, chest 06/03/2014  . Erectile dysfunction 06/03/2014  . Spinal stenosis of lumbar region 03/15/2014  . Cough 03/12/2013  . Allergic rhinitis 03/12/2013  . Leg pain, bilateral 12/30/2011  . Bladder neck obstruction 12/30/2011  . Preventative health care 12/25/2011  . Lumbar disc disease 12/25/2011  . S/P removal of thyroid nodule 12/25/2011  . History of MI (myocardial infarction) 12/25/2011  . Right knee meniscal tear 12/25/2011  . Degenerative arthritis of hip 12/25/2011  . HIP PAIN, RIGHT 05/14/2010  . FATIGUE 10/13/2009  . HYPERTENSION, BENIGN 02/25/2009  . CAD, AUTOLOGOUS BYPASS GRAFT 02/25/2009  . CORONARY ARTERY ANEURYSM 02/25/2009  . BACK PAIN 04/15/2008  . Hyperlipidemia 02/13/2008  . CORONARY ARTERY DISEASE 02/13/2008  . DIVERTICULOSIS, COLON  02/13/2008  . CARDIAC DISEASE, HX OF 02/13/2008  . COLONIC POLYPS, HX OF 02/13/2008   12:32 PM,02/06/18 Sherol Dade PT, DPT [place] [facility] [place]  [facility] [address] [place], [place], [address] Phone: [phone]   Fax:  [phone]  Name: Larry [name] MRN: [id number] Date of Birth: 1932/02/27

## 2018-02-07 DIAGNOSIS — N281 Cyst of kidney, acquired: Secondary | ICD-10-CM | POA: Diagnosis not present

## 2018-02-07 DIAGNOSIS — N201 Calculus of ureter: Secondary | ICD-10-CM | POA: Diagnosis not present

## 2018-02-09 ENCOUNTER — Ambulatory Visit: Payer: Medicare Other | Admitting: Physical Therapy

## 2018-02-09 ENCOUNTER — Encounter: Payer: Self-pay | Admitting: Physical Therapy

## 2018-02-09 DIAGNOSIS — M6281 Muscle weakness (generalized): Secondary | ICD-10-CM

## 2018-02-09 DIAGNOSIS — R2689 Other abnormalities of gait and mobility: Secondary | ICD-10-CM | POA: Diagnosis not present

## 2018-02-09 NOTE — Therapy (Signed)
[facility] [address], [place], [place], [address] Phone: [phone]   Fax:  [phone]  Physical Therapy Treatment  Patient Details  Name: Larry [name] MRN: [id number] Date of Birth: 07-30-1932 Referring Provider: Jovita Gamma, MD   Encounter Date: 02/09/2018  PT End of Session - 02/09/18 1239    Visit Number  5    Date for PT Re-Evaluation  03/22/18    Authorization Type  Medicare    PT Start Time  7106    PT Stop Time  1229    PT Time Calculation (min)  44 min    Activity Tolerance  Patient tolerated treatment well;No increased pain    Behavior During Therapy  WFL for tasks assessed/performed       Past Medical History:  Diagnosis Date  . Allergic rhinitis, cause unspecified 03/12/2013  . Allergy   . CAD (coronary artery disease)    S/P CABG in 1999   . Cataract   . Decreased exercise tolerance    Exertional fatigue  . Degenerative arthritis of hip   . Diverticulosis of colon   . ED (erectile dysfunction)   . Erectile dysfunction 06/03/2014  . History of colonic polyps   . History of kidney stones   . History of MI (myocardial infarction)   . Hx of adenomatous colonic polyps 2000  . Hyperlipidemia    Low HDL  . Lumbar disc disease    Hx of with recent back pain and buttock pain  . Myocardial infarction ([place]) 1999  . Right knee meniscal tear   . S/P removal of thyroid nodule   . Spinal stenosis of lumbar region 03/15/2014    Past Surgical History:  Procedure Laterality Date  . CARDIAC CATHETERIZATION    . CATARACT EXTRACTION    . COLONOSCOPY    . CORONARY ARTERY BYPASS GRAFT  1999  . CYSTOSCOPY/URETEROSCOPY/HOLMIUM LASER/STENT PLACEMENT Left 12/23/2017   Procedure: CYSTOSCOPY/RETROGRADE/URETEROSCOPY/HOLMIUM LASER/STENT PLACEMENT;  Surgeon: Festus Aloe, MD;  [facility];  Service: Urology;  Laterality: Left;  ONLY NEEDS 60 MIN  . LUMBAR DISC SURGERY    . Nodules on Thyroid     . POLYPECTOMY    . PTCA    . SHOULDER ARTHROSCOPY WITH ROTATOR CUFF REPAIR AND SUBACROMIAL DECOMPRESSION Right 01/30/2015   Procedure: RIGHT ARTHROSCOPY SHOULDER SUBACROMIAL DECOMPRESSION DISTAL CLAVICAL RESECTION RETATOR CUFF REPAIR;  Surgeon: Justice Britain, MD;  Location: [facility];  Service: Orthopedics;  Laterality: Right;  . TONSILLECTOMY    . VASECTOMY      There were no vitals filed for this visit.  Subjective Assessment - 02/09/18 1150    Subjective  Pt reports that he was a little tired after his last session, but he was not really sore. He has been working on his exercises at home.     Currently in Pain?  No/denies              [facility] Adult PT Treatment/Exercise - 02/09/18 0001      Lumbar Exercises: Seated   Other Seated Lumbar Exercises  seated hip flexion on dyna disc 2x10 reps each, cuing to encourage abdominal activation      Knee/Hip Exercises: Seated   Other Seated Knee/Hip Exercises  Rt/Lt ankle 4-way with green TB: x20 reps PF/DF; x15 reps inversion/eversion          Balance Exercises - 02/09/18 1206      Balance Exercises: Standing   Standing Eyes Closed  Narrow base of support (BOS);Foam/compliant  surface;2 reps;30 secs    Tandem Stance  Eyes open;2 reps;30 secs    Cone Rotation  Foam/compliant surface;Right turn;Left turn;Other (comment) NBOS on foam pad x10 reps     Cone Rotation Limitations  seated cone rotation across the body x10 reps each (0 UE support)     Other Standing Exercises  alt LE marches at 4" box 2x10 reps without UE support         PT Education - 02/09/18 1238    Education provided  Yes    Education Details  addition of ankle 4 way to HEP; discussed impact posture/core strength has on balance    Person(s) Educated  Patient    Methods  Explanation;Handout    Comprehension  Verbalized understanding;Returned demonstration       PT Short Term Goals - 02/09/18 1245      PT SHORT TERM GOAL #1   Title  be independent in initial HEP     Time  4    Period  Weeks    Status  Achieved      PT SHORT TERM GOAL #2   Title  improve LE strength and endurance to stand for 15-20 minutes without rest    Time  4    Period  Weeks    Status  On-going      PT SHORT TERM GOAL #3   Title  improve 6 minute walk test to ambulate 750 feet     Time  4    Period  Weeks    Status  On-going      PT SHORT TERM GOAL #4   Title  demonstrate bilateral heel strike and normalized step length on level surface to improve safety    Time  4    Period  Weeks    Status  On-going        PT Long Term Goals - 02/09/18 1245      PT LONG TERM GOAL #1   Title  be independent in advanced HEP    Time  8    Period  Weeks    Status  On-going      PT LONG TERM GOAL #2   Title  reduce FOTO to < or = to 41% limitation    Time  8    Period  Weeks    Status  On-going      PT LONG TERM GOAL #3   Title  improve LE strength and endurance to walk for 15 minutes in the community without need to rest    Time  8    Period  Weeks    Status  On-going      PT LONG TERM GOAL #4   Title  improve 6 minute walk test to ambulate 800 feet     Time  8    Period  Weeks    Status  On-going      PT LONG TERM GOAL #5   Title  demonstrate 4+/5 bil LE strength to improve safety and endurance on unlevel surfaces    Time  8    Period  Weeks    Status  On-going            Plan - 02/09/18 1240    Clinical Impression Statement  Today's session focused on therex to improve ankle strength and endurance as well as progression of seated and standing balance activity. Pt was able to maintain tandem hold with either LE forward for up to 30 sec  this session, with minimal postural sway or LOB. This was an improvement from previous sessions. Ended session with overall fatigue but no reports of back pain, and pt verbalized agreement with HEP additions made this visit. Pt would continue to benefit from skilled PT to improve his strength, endurance and proprioception  during activity.      Rehab Potential  Good    PT Frequency  2x / week    PT Duration  8 weeks    PT Treatment/Interventions  ADLs/Self Care Home Management;Cryotherapy;Moist Heat;Balance training;Therapeutic exercise;Functional mobility training;Therapeutic activities;Gait training;Stair training;Neuromuscular re-education;Patient/family education;Passive range of motion;Manual techniques    PT Next Visit Plan  progress endurance/activity circuits, LE strengthening (hip extensors/abductors), balance, gait    PT Home Exercise Plan  added sit to stand, standing heel raises/toe raises, tandem (EO/EC), ankle 4 way (green band)    Consulted and Agree with Plan of Care  Patient       Patient will benefit from skilled therapeutic intervention in order to improve the following deficits and impairments:  Abnormal gait, Difficulty walking, Decreased balance, Decreased activity tolerance, Decreased mobility, Decreased coordination, Postural dysfunction  Visit Diagnosis: Muscle weakness (generalized)  Other abnormalities of gait and mobility     Problem List Patient Active Problem List   Diagnosis Date Noted  . Gait disorder 04/20/2016  . Hyperglycemia 04/20/2016  . Abnormal CT scan, chest 06/03/2014  . Erectile dysfunction 06/03/2014  . Spinal stenosis of lumbar region 03/15/2014  . Cough 03/12/2013  . Allergic rhinitis 03/12/2013  . Leg pain, bilateral 12/30/2011  . Bladder neck obstruction 12/30/2011  . Preventative health care 12/25/2011  . Lumbar disc disease 12/25/2011  . S/P removal of thyroid nodule 12/25/2011  . History of MI (myocardial infarction) 12/25/2011  . Right knee meniscal tear 12/25/2011  . Degenerative arthritis of hip 12/25/2011  . HIP PAIN, RIGHT 05/14/2010  . FATIGUE 10/13/2009  . HYPERTENSION, BENIGN 02/25/2009  . CAD, AUTOLOGOUS BYPASS GRAFT 02/25/2009  . CORONARY ARTERY ANEURYSM 02/25/2009  . BACK PAIN 04/15/2008  . Hyperlipidemia 02/13/2008  . CORONARY  ARTERY DISEASE 02/13/2008  . DIVERTICULOSIS, COLON 02/13/2008  . CARDIAC DISEASE, HX OF 02/13/2008  . COLONIC POLYPS, HX OF 02/13/2008    12:49 PM,02/09/18 Sherol Dade PT, DPT [place] [address] [place], [place], [address] Phone: [phone]   Fax:  [phone]  Name: Arkin Imran MRN: [id number] Date of Birth: 1932/02/04

## 2018-02-09 NOTE — Patient Instructions (Signed)
   Ankle 4way with green band  All theraband exercise is slow and controlled. Do not let the band "bounce" back. A. Plantarflexion: "gas pedal." Keep knee straight.Band around "ball of foot" and press it away as far as possible and slowly return to neutral. Repeat. B. Dorsiflexion: start in neutral and pull theraband back toward you as far as possible. pause. return slowly. keep knee straight. C.Inversion: start neutral and bring band toward your midline without bending or twisting knee. D. Eversion: start neutral and press band out without bending or twisting knee.      [facility] [address] [place], [place] [address] Phone # [phone] Fax [phone]

## 2018-02-13 ENCOUNTER — Ambulatory Visit: Payer: Medicare Other | Admitting: Physical Therapy

## 2018-02-13 ENCOUNTER — Encounter: Payer: Self-pay | Admitting: Physical Therapy

## 2018-02-13 DIAGNOSIS — M6281 Muscle weakness (generalized): Secondary | ICD-10-CM | POA: Diagnosis not present

## 2018-02-13 DIAGNOSIS — R2689 Other abnormalities of gait and mobility: Secondary | ICD-10-CM

## 2018-02-13 NOTE — Therapy (Signed)
[facility] [address], [place], [place], [address] Phone: [phone]   Fax:  [phone]  Physical Therapy Treatment  Patient Details  Name: Larry [name] MRN: [id number] Date of Birth: 18-Jan-1932 Referring Provider: Jovita Gamma, MD   Encounter Date: 02/13/2018  PT End of Session - 02/13/18 1333    Visit Number  6    Date for PT Re-Evaluation  03/22/18    Authorization Type  Medicare    PT Start Time  6962    PT Stop Time  1227    PT Time Calculation (min)  39 min    Activity Tolerance  Patient tolerated treatment well;No increased pain    Behavior During Therapy  WFL for tasks assessed/performed       Past Medical History:  Diagnosis Date  . Allergic rhinitis, cause unspecified 03/12/2013  . Allergy   . CAD (coronary artery disease)    S/P CABG in 1999   . Cataract   . Decreased exercise tolerance    Exertional fatigue  . Degenerative arthritis of hip   . Diverticulosis of colon   . ED (erectile dysfunction)   . Erectile dysfunction 06/03/2014  . History of colonic polyps   . History of kidney stones   . History of MI (myocardial infarction)   . Hx of adenomatous colonic polyps 2000  . Hyperlipidemia    Low HDL  . Lumbar disc disease    Hx of with recent back pain and buttock pain  . Myocardial infarction ([place]) 1999  . Right knee meniscal tear   . S/P removal of thyroid nodule   . Spinal stenosis of lumbar region 03/15/2014    Past Surgical History:  Procedure Laterality Date  . CARDIAC CATHETERIZATION    . CATARACT EXTRACTION    . COLONOSCOPY    . CORONARY ARTERY BYPASS GRAFT  1999  . CYSTOSCOPY/URETEROSCOPY/HOLMIUM LASER/STENT PLACEMENT Left 12/23/2017   Procedure: CYSTOSCOPY/RETROGRADE/URETEROSCOPY/HOLMIUM LASER/STENT PLACEMENT;  Surgeon: Festus Aloe, MD;  Location: [facility];  Service: Urology;  Laterality: Left;  ONLY NEEDS 60 MIN  . LUMBAR DISC SURGERY    . Nodules on Thyroid     . POLYPECTOMY    . PTCA    . SHOULDER ARTHROSCOPY WITH ROTATOR CUFF REPAIR AND SUBACROMIAL DECOMPRESSION Right 01/30/2015   Procedure: RIGHT ARTHROSCOPY SHOULDER SUBACROMIAL DECOMPRESSION DISTAL CLAVICAL RESECTION RETATOR CUFF REPAIR;  Surgeon: Justice Britain, MD;  Location: [facility];  Service: Orthopedics;  Laterality: Right;  . TONSILLECTOMY    . VASECTOMY      There were no vitals filed for this visit.  Subjective Assessment - 02/13/18 1241    Subjective  Pt reports things continue to go well. He feels he is walking better and was able to walk at the park for about 30 minutes the other day which is much more than he has been able to do prior to this. He continues to complete his HEP without difficulty.     Currently in Pain?  No/denies                      [facility] Adult PT Treatment/Exercise - 02/13/18 0001      Lumbar Exercises: Seated   Other Seated Lumbar Exercises  Rt ankle PF/inversion/eversion with blue TB x5 reps each (HEP demo)      Lumbar Exercises: Supine   Pelvic Tilt  Limitations    Pelvic Tilt Limitations  heavy cuing from therapist to encourage proper abdominal activation; pt tendency  to utilize glutes and trunk extensors     Bent Knee Raise  10 reps;Limitations    Bent Knee Raise Limitations  therapist cuing for abdominal bracing, transition to knee extension and slow straight leg lower       Knee/Hip Exercises: Standing   SLS  2-4 sec each (+) trendelenburg Lt>Rt    Other Standing Knee Exercises  sidestepping along mat table (BUE support) with yellow TB around feet x2 trials Lt/Rt       Knee/Hip Exercises: Sidelying   Clams  x15 reps each with blue TB          Balance Exercises - 02/13/18 1339      Balance Exercises: Standing   Standing Eyes Closed  Narrow base of support (BOS);Foam/compliant surface;2 reps;30 secs    Tandem Stance  Eyes open;Eyes closed;3 reps;30 secs    SLS  Eyes open;1 rep;15 secs;Solid surface    Tandem Gait  3 reps;Forward         PT Education - 02/13/18 1333    Education provided  Yes    Education Details  reviewed technique and HEP instruction; addition to HEP; importance of proper abdominal activation and control    Person(s) Educated  Patient    Methods  Explanation;Handout;Verbal cues    Comprehension  Verbalized understanding;Returned demonstration       PT Short Term Goals - 02/09/18 1245      PT SHORT TERM GOAL #1   Title  be independent in initial HEP    Time  4    Period  Weeks    Status  Achieved      PT SHORT TERM GOAL #2   Title  improve LE strength and endurance to stand for 15-20 minutes without rest    Time  4    Period  Weeks    Status  On-going      PT SHORT TERM GOAL #3   Title  improve 6 minute walk test to ambulate 750 feet     Time  4    Period  Weeks    Status  On-going      PT SHORT TERM GOAL #4   Title  demonstrate bilateral heel strike and normalized step length on level surface to improve safety    Time  4    Period  Weeks    Status  On-going        PT Long Term Goals - 02/09/18 1245      PT LONG TERM GOAL #1   Title  be independent in advanced HEP    Time  8    Period  Weeks    Status  On-going      PT LONG TERM GOAL #2   Title  reduce FOTO to < or = to 41% limitation    Time  8    Period  Weeks    Status  On-going      PT LONG TERM GOAL #3   Title  improve LE strength and endurance to walk for 15 minutes in the community without need to rest    Time  8    Period  Weeks    Status  On-going      PT LONG TERM GOAL #4   Title  improve 6 minute walk test to ambulate 800 feet     Time  8    Period  Weeks    Status  On-going      PT LONG TERM GOAL #  5   Title  demonstrate 4+/5 bil LE strength to improve safety and endurance on unlevel surfaces    Time  8    Period  Weeks    Status  On-going            Plan - 02/13/18 1334    Clinical Impression Statement  Pt is making progress towards his goals, noting increase in stamina and  strength. He was recently able to complete 30 min of activity/walking outside which he notes is a great improvement. He continues to complete his HEP, although therapist had to address technique this session. Pt has significant difficulty activating his deep abdominals, using paraspinals and glutes despite maximum cuing from the therapist. He will continue to benefit from skilled PT to address strength, flexibility, proprioception and endurance limitations.     Rehab Potential  Good    PT Frequency  2x / week    PT Duration  8 weeks    PT Treatment/Interventions  ADLs/Self Care Home Management;Cryotherapy;Moist Heat;Balance training;Therapeutic exercise;Functional mobility training;Therapeutic activities;Gait training;Stair training;Neuromuscular re-education;Patient/family education;Passive range of motion;Manual techniques    PT Next Visit Plan  progress endurance/activity circuits; f/u on abdominal bracing; LE strengthening (hip extensors/abductors), balance, gait    PT Home Exercise Plan  added sit to stand, standing heel raises/toe raises, tandem (EO/EC), ankle 4 way (green band)    Consulted and Agree with Plan of Care  Patient       Patient will benefit from skilled therapeutic intervention in order to improve the following deficits and impairments:  Abnormal gait, Difficulty walking, Decreased balance, Decreased activity tolerance, Decreased mobility, Decreased coordination, Postural dysfunction  Visit Diagnosis: Muscle weakness (generalized)  Other abnormalities of gait and mobility     Problem List Patient Active Problem List   Diagnosis Date Noted  . Gait disorder 04/20/2016  . Hyperglycemia 04/20/2016  . Abnormal CT scan, chest 06/03/2014  . Erectile dysfunction 06/03/2014  . Spinal stenosis of lumbar region 03/15/2014  . Cough 03/12/2013  . Allergic rhinitis 03/12/2013  . Leg pain, bilateral 12/30/2011  . Bladder neck obstruction 12/30/2011  . Preventative health care  12/25/2011  . Lumbar disc disease 12/25/2011  . S/P removal of thyroid nodule 12/25/2011  . History of MI (myocardial infarction) 12/25/2011  . Right knee meniscal tear 12/25/2011  . Degenerative arthritis of hip 12/25/2011  . HIP PAIN, RIGHT 05/14/2010  . FATIGUE 10/13/2009  . HYPERTENSION, BENIGN 02/25/2009  . CAD, AUTOLOGOUS BYPASS GRAFT 02/25/2009  . CORONARY ARTERY ANEURYSM 02/25/2009  . BACK PAIN 04/15/2008  . Hyperlipidemia 02/13/2008  . CORONARY ARTERY DISEASE 02/13/2008  . DIVERTICULOSIS, COLON 02/13/2008  . CARDIAC DISEASE, HX OF 02/13/2008  . COLONIC POLYPS, HX OF 02/13/2008    1:40 PM,02/13/18 Sherol Dade PT, DPT [place] [facility] [address] [place], [place], [address] Phone: [phone]   Fax:  [phone]  Name: Larry [name] MRN: [id number] Date of Birth: [birth year]

## 2018-02-13 NOTE — Patient Instructions (Signed)
     Sidestepping   This exercise is designed to help strengthen the muscles that stabilize the hips and move the legs sideways. Help the child stand against a straight, open wall with both hands placed on it for support. Instruct the child to step sideways and use the wall for support if needed. Monitor the child to make sure they do not lean sideways while stepping. If your child requires extra assistance, practice weight shifting in the opposite the direction to free the other leg to sidestepping. Provide support at the hips if needed. Repeat activity sidestepping in both directions.    [place], 1-2 rounds down and back. Yellow band around toes.    Larry [name] [address] [place], [name] [address] Phone # [phone] Fax [phone]

## 2018-02-16 ENCOUNTER — Ambulatory Visit: Payer: Medicare Other | Admitting: Physical Therapy

## 2018-02-16 ENCOUNTER — Encounter: Payer: Self-pay | Admitting: Physical Therapy

## 2018-02-16 DIAGNOSIS — M6281 Muscle weakness (generalized): Secondary | ICD-10-CM | POA: Diagnosis not present

## 2018-02-16 DIAGNOSIS — R2689 Other abnormalities of gait and mobility: Secondary | ICD-10-CM

## 2018-02-16 NOTE — Therapy (Signed)
[facility] [address], [place], [place], [address] Phone: [phone]   Fax:  [phone]  Physical Therapy Treatment  Patient Details  Name: Larry [name] MRN: [id number] Date of Birth: 01-09-1932 Referring Provider: Jovita Gamma, MD   Encounter Date: 02/16/2018  PT End of Session - 02/16/18 1441    Visit Number  7    Date for PT Re-Evaluation  03/22/18    Authorization Type  Medicare    PT Start Time  1400    PT Stop Time  1440    PT Time Calculation (min)  40 min    Activity Tolerance  Patient tolerated treatment well;No increased pain    Behavior During Therapy  WFL for tasks assessed/performed       Past Medical History:  Diagnosis Date  . Allergic rhinitis, cause unspecified 03/12/2013  . Allergy   . CAD (coronary artery disease)    S/P CABG in 1999   . Cataract   . Decreased exercise tolerance    Exertional fatigue  . Degenerative arthritis of hip   . Diverticulosis of colon   . ED (erectile dysfunction)   . Erectile dysfunction 06/03/2014  . History of colonic polyps   . History of kidney stones   . History of MI (myocardial infarction)   . Hx of adenomatous colonic polyps 2000  . Hyperlipidemia    Low HDL  . Lumbar disc disease    Hx of with recent back pain and buttock pain  . Myocardial infarction ([place]) 1999  . Right knee meniscal tear   . S/P removal of thyroid nodule   . Spinal stenosis of lumbar region 03/15/2014    Past Surgical History:  Procedure Laterality Date  . CARDIAC CATHETERIZATION    . CATARACT EXTRACTION    . COLONOSCOPY    . CORONARY ARTERY BYPASS GRAFT  1999  . CYSTOSCOPY/URETEROSCOPY/HOLMIUM LASER/STENT PLACEMENT Left 12/23/2017   Procedure: CYSTOSCOPY/RETROGRADE/URETEROSCOPY/HOLMIUM LASER/STENT PLACEMENT;  Surgeon: Festus Aloe, MD;  Location: [facility];  Service: Urology;  Laterality: Left;  ONLY NEEDS 60 MIN  . LUMBAR DISC SURGERY    . Nodules on Thyroid     . POLYPECTOMY    . PTCA    . SHOULDER ARTHROSCOPY WITH ROTATOR CUFF REPAIR AND SUBACROMIAL DECOMPRESSION Right 01/30/2015   Procedure: RIGHT ARTHROSCOPY SHOULDER SUBACROMIAL DECOMPRESSION DISTAL CLAVICAL RESECTION RETATOR CUFF REPAIR;  Surgeon: Justice Britain, MD;  Location: [facility];  Service: Orthopedics;  Laterality: Right;  . TONSILLECTOMY    . VASECTOMY      There were no vitals filed for this visit.  Subjective Assessment - 02/16/18 1403    Subjective  Pt reports things are going well today. No complaints at this time.    Currently in Pain?  No/denies                      [facility] Adult PT Treatment/Exercise - 02/16/18 0001      Lumbar Exercises: Standing   Other Standing Lumbar Exercises  alt LE tap on 6" box 3x15 reps each, CGA      Lumbar Exercises: Supine   Isometric Hip Flexion  15 reps;3 seconds;Limitations    Isometric Hip Flexion Limitations  oblique isometric, cuing to encourage proper breathing      Knee/Hip Exercises: Supine   Straight Leg Raises  Both;2 sets;10 reps;Strengthening    Other Supine Knee/Hip Exercises  hip abduction slide 2x10 reps each with cuing to maintain abdominal bracing  Knee/Hip Exercises: Sidelying   Hip ABduction  Limitations    Hip ABduction Limitations  attempted sidelying hip abduction but pt unable with proper technique          Balance Exercises - 02/16/18 1441      Balance Exercises: Standing   Standing Eyes Opened  Narrow base of support (BOS) head turns: Lt/Rt and up/down    Standing Eyes Closed  Narrow base of support (BOS);2 reps;Foam/compliant surface;30 secs    Tandem Stance  Foam/compliant surface;Eyes open;2 reps;30 secs    Standing, One Foot on a Step  6 inch;Eyes open LE lift off 10x2 sec, intermittent UE support    Tandem Gait  Forward;2 reps x67ft one direction    Retro Gait  2 reps x12 ft each    Sidestepping  2 reps each direction x12 ft         PT Education - 02/16/18 1441    Education  provided  Yes    Education Details  technique with therex    Methods  Verbal cues;Tactile cues;Explanation    Comprehension  Verbalized understanding;Need further instruction       PT Short Term Goals - 02/09/18 1245      PT SHORT TERM GOAL #1   Title  be independent in initial HEP    Time  4    Period  Weeks    Status  Achieved      PT SHORT TERM GOAL #2   Title  improve LE strength and endurance to stand for 15-20 minutes without rest    Time  4    Period  Weeks    Status  On-going      PT SHORT TERM GOAL #3   Title  improve 6 minute walk test to ambulate 750 feet     Time  4    Period  Weeks    Status  On-going      PT SHORT TERM GOAL #4   Title  demonstrate bilateral heel strike and normalized step length on level surface to improve safety    Time  4    Period  Weeks    Status  On-going        PT Long Term Goals - 02/09/18 1245      PT LONG TERM GOAL #1   Title  be independent in advanced HEP    Time  8    Period  Weeks    Status  On-going      PT LONG TERM GOAL #2   Title  reduce FOTO to < or = to 41% limitation    Time  8    Period  Weeks    Status  On-going      PT LONG TERM GOAL #3   Title  improve LE strength and endurance to walk for 15 minutes in the community without need to rest    Time  8    Period  Weeks    Status  On-going      PT LONG TERM GOAL #4   Title  improve 6 minute walk test to ambulate 800 feet     Time  8    Period  Weeks    Status  On-going      PT LONG TERM GOAL #5   Title  demonstrate 4+/5 bil LE strength to improve safety and endurance on unlevel surfaces    Time  8    Period  Weeks    Status  On-going            Plan - 02/16/18 1442    Clinical Impression Statement  Continued this session with therex to promote trunk and hip strength. Pt still has difficulty with proper activation of deep abdominals and requires cuing to decrease compensations noted with exercise. Also completed balance activity with more  unstable surfaces. Pt became fatigued near the end of his session, evident by flexed posturing and LE muscle shaking. No reports of pain following today's exercises.    Rehab Potential  Good    PT Frequency  2x / week    PT Duration  8 weeks    PT Treatment/Interventions  ADLs/Self Care Home Management;Cryotherapy;Moist Heat;Balance training;Therapeutic exercise;Functional mobility training;Therapeutic activities;Gait training;Stair training;Neuromuscular re-education;Patient/family education;Passive range of motion;Manual techniques    PT Next Visit Plan  progress endurance/activity; f/u on abdominal bracing; LE strengthening (hip extensors/abductors), balance, gait    PT Home Exercise Plan  added sit to stand, standing heel raises/toe raises, tandem (EO/EC), ankle 4 way (green band)    Consulted and Agree with Plan of Care  Patient       Patient will benefit from skilled therapeutic intervention in order to improve the following deficits and impairments:  Abnormal gait, Difficulty walking, Decreased balance, Decreased activity tolerance, Decreased mobility, Decreased coordination, Postural dysfunction  Visit Diagnosis: Muscle weakness (generalized)  Other abnormalities of gait and mobility     Problem List Patient Active Problem List   Diagnosis Date Noted  . Gait disorder 04/20/2016  . Hyperglycemia 04/20/2016  . Abnormal CT scan, chest 06/03/2014  . Erectile dysfunction 06/03/2014  . Spinal stenosis of lumbar region 03/15/2014  . Cough 03/12/2013  . Allergic rhinitis 03/12/2013  . Leg pain, bilateral 12/30/2011  . Bladder neck obstruction 12/30/2011  . Preventative health care 12/25/2011  . Lumbar disc disease 12/25/2011  . S/P removal of thyroid nodule 12/25/2011  . History of MI (myocardial infarction) 12/25/2011  . Right knee meniscal tear 12/25/2011  . Degenerative arthritis of hip 12/25/2011  . HIP PAIN, RIGHT 05/14/2010  . FATIGUE 10/13/2009  . HYPERTENSION, BENIGN  02/25/2009  . CAD, AUTOLOGOUS BYPASS GRAFT 02/25/2009  . CORONARY ARTERY ANEURYSM 02/25/2009  . BACK PAIN 04/15/2008  . Hyperlipidemia 02/13/2008  . CORONARY ARTERY DISEASE 02/13/2008  . DIVERTICULOSIS, COLON 02/13/2008  . CARDIAC DISEASE, HX OF 02/13/2008  . COLONIC POLYPS, HX OF 02/13/2008    2:59 PM,02/16/18 Sherol Dade PT, DPT [place] [facility] [address] [place], [place], [address] Phone: [phone]   Fax:  [phone]  Name: Larry [name] MRN: [id number] Date of Birth: 27-Dec-1931

## 2018-02-21 ENCOUNTER — Ambulatory Visit: Payer: Medicare Other | Attending: Neurosurgery

## 2018-02-21 DIAGNOSIS — M6281 Muscle weakness (generalized): Secondary | ICD-10-CM | POA: Insufficient documentation

## 2018-02-21 DIAGNOSIS — R2689 Other abnormalities of gait and mobility: Secondary | ICD-10-CM | POA: Insufficient documentation

## 2018-02-21 NOTE — Therapy (Signed)
[facility] [address], [place], [place], [address] Phone: [phone]   Fax:  [phone]  Physical Therapy Treatment  Patient Details  Name: Larry [name] MRN: [id number] Date of Birth: [birth year] Referring Provider: Jovita Gamma, MD   Encounter Date: 02/21/2018  PT End of Session - 02/21/18 1236    Visit Number  8    Date for PT Re-Evaluation  03/22/18    Authorization Type  Medicare    PT Start Time  1147    PT Stop Time  1231    PT Time Calculation (min)  44 min    Activity Tolerance  Patient tolerated treatment well    Behavior During Therapy  [facility] for tasks assessed/performed       Past Medical History:  Diagnosis Date  . Allergic rhinitis, cause unspecified 03/12/2013  . Allergy   . CAD (coronary artery disease)    S/P CABG in 1999   . Cataract   . Decreased exercise tolerance    Exertional fatigue  . Degenerative arthritis of hip   . Diverticulosis of colon   . ED (erectile dysfunction)   . Erectile dysfunction 06/03/2014  . History of colonic polyps   . History of kidney stones   . History of MI (myocardial infarction)   . Hx of adenomatous colonic polyps 2000  . Hyperlipidemia    Low HDL  . Lumbar disc disease    Hx of with recent back pain and buttock pain  . Myocardial infarction ([place]) 1999  . Right knee meniscal tear   . S/P removal of thyroid nodule   . Spinal stenosis of lumbar region 03/15/2014    Past Surgical History:  Procedure Laterality Date  . CARDIAC CATHETERIZATION    . CATARACT EXTRACTION    . COLONOSCOPY    . CORONARY ARTERY BYPASS GRAFT  1999  . CYSTOSCOPY/URETEROSCOPY/HOLMIUM LASER/STENT PLACEMENT Left 12/23/2017   Procedure: CYSTOSCOPY/RETROGRADE/URETEROSCOPY/HOLMIUM LASER/STENT PLACEMENT;  Surgeon: Festus Aloe, MD;  Location: [facility];  Service: Urology;  Laterality: Left;  ONLY NEEDS 60 MIN  . LUMBAR DISC SURGERY    . Nodules on Thyroid    . POLYPECTOMY    .  PTCA    . SHOULDER ARTHROSCOPY WITH ROTATOR CUFF REPAIR AND SUBACROMIAL DECOMPRESSION Right 01/30/2015   Procedure: RIGHT ARTHROSCOPY SHOULDER SUBACROMIAL DECOMPRESSION DISTAL CLAVICAL RESECTION RETATOR CUFF REPAIR;  Surgeon: Justice Britain, MD;  Location: [place];  Service: Orthopedics;  Laterality: Right;  . TONSILLECTOMY    . VASECTOMY      There were no vitals filed for this visit.  Subjective Assessment - 02/21/18 1150    Subjective  I am more fatigued than usual.    Currently in Pain?  No/denies                      [facility] Adult PT Treatment/Exercise - 02/21/18 0001      Lumbar Exercises: Aerobic   Nustep  Level 1x 8 minutes  PT monitored       Lumbar Exercises: Standing   Other Standing Lumbar Exercises  alt LE tap on 6" box 3x15 reps each, CGA      Knee/Hip Exercises: Standing   Heel Raises  Both;2 sets;10 reps    Hip Abduction  Stengthening;Both;2 sets;10 reps    Abduction Limitations  verbal cues to avoid substitution    Hip Extension  Stengthening;Both;2 sets;10 reps    Forward Step Up  1 set;10 reps;Both    SLS  2-4 sec each (+) trendelenburg Lt>Rt    Other Standing Knee Exercises  sidestepping at counter top       Knee/Hip Exercises: Seated   Sit to Sand  20 reps      Knee/Hip Exercises: Supine   Straight Leg Raises  Both;2 sets;10 reps;Strengthening               PT Short Term Goals - 02/21/18 1158      PT SHORT TERM GOAL #2   Title  improve LE strength and endurance to stand for 15-20 minutes without rest    Baseline  15 minutes for shaving and showering    Status  Achieved      PT SHORT TERM GOAL #4   Title  demonstrate bilateral heel strike and normalized step length on level surface to improve safety    Time  4    Period  Weeks    Status  On-going        PT Long Term Goals - 02/09/18 1245      PT LONG TERM GOAL #1   Title  be independent in advanced HEP    Time  8    Period  Weeks    Status  On-going      PT LONG TERM  GOAL #2   Title  reduce FOTO to < or = to 41% limitation    Time  8    Period  Weeks    Status  On-going      PT LONG TERM GOAL #3   Title  improve LE strength and endurance to walk for 15 minutes in the community without need to rest    Time  8    Period  Weeks    Status  On-going      PT LONG TERM GOAL #4   Title  improve 6 minute walk test to ambulate 800 feet     Time  8    Period  Weeks    Status  On-going      PT LONG TERM GOAL #5   Title  demonstrate 4+/5 bil LE strength to improve safety and endurance on unlevel surfaces    Time  8    Period  Weeks    Status  On-going            Plan - 02/21/18 1159    Clinical Impression Statement  Pt reports reduced endurance over the past few days.  Pt required rest breaks and monitoring during activity.  Pt is able to perfom step taps x 15 without UE support today.  Pt requires cueing to reduce compensation due to weakness.  Pt with continued strength and balance deficits.  Pt will continue to benefit from skilled PT for LE strength, gait and endurance.      Rehab Potential  Good    PT Frequency  2x / week    PT Duration  8 weeks    PT Treatment/Interventions  ADLs/Self Care Home Management;Cryotherapy;Moist Heat;Balance training;Therapeutic exercise;Functional mobility training;Therapeutic activities;Gait training;Stair training;Neuromuscular re-education;Patient/family education;Passive range of motion;Manual techniques    PT Next Visit Plan  progress endurance/activity;  LE strengthening (hip extensors/abductors), balance, gait    Recommended Other Services  initial certification is signed    Consulted and Agree with Plan of Care  Patient       Patient will benefit from skilled therapeutic intervention in order to improve the following deficits and impairments:  Abnormal gait, Difficulty walking, Decreased balance, Decreased activity tolerance,  Decreased mobility, Decreased coordination, Postural dysfunction  Visit  Diagnosis: Muscle weakness (generalized)  Other abnormalities of gait and mobility     Problem List Patient Active Problem List   Diagnosis Date Noted  . Gait disorder 04/20/2016  . Hyperglycemia 04/20/2016  . Abnormal CT scan, chest 06/03/2014  . Erectile dysfunction 06/03/2014  . Spinal stenosis of lumbar region 03/15/2014  . Cough 03/12/2013  . Allergic rhinitis 03/12/2013  . Leg pain, bilateral 12/30/2011  . Bladder neck obstruction 12/30/2011  . Preventative health care 12/25/2011  . Lumbar disc disease 12/25/2011  . S/P removal of thyroid nodule 12/25/2011  . History of MI (myocardial infarction) 12/25/2011  . Right knee meniscal tear 12/25/2011  . Degenerative arthritis of hip 12/25/2011  . HIP PAIN, RIGHT 05/14/2010  . FATIGUE 10/13/2009  . HYPERTENSION, BENIGN 02/25/2009  . CAD, AUTOLOGOUS BYPASS GRAFT 02/25/2009  . CORONARY ARTERY ANEURYSM 02/25/2009  . BACK PAIN 04/15/2008  . Hyperlipidemia 02/13/2008  . CORONARY ARTERY DISEASE 02/13/2008  . DIVERTICULOSIS, COLON 02/13/2008  . CARDIAC DISEASE, HX OF 02/13/2008  . COLONIC POLYPS, HX OF 02/13/2008     Sigurd Sos, PT 02/21/18 12:42 PM  [facility] [address] [place], [place], [address] Phone: [phone]   Fax:  [phone]  Name: Larry [name] MRN: [id number] Date of Birth: 04/18/1932

## 2018-02-24 ENCOUNTER — Ambulatory Visit: Payer: Medicare Other | Admitting: Physical Therapy

## 2018-02-24 DIAGNOSIS — R2689 Other abnormalities of gait and mobility: Secondary | ICD-10-CM

## 2018-02-24 DIAGNOSIS — M6281 Muscle weakness (generalized): Secondary | ICD-10-CM | POA: Diagnosis not present

## 2018-02-26 ENCOUNTER — Encounter: Payer: Self-pay | Admitting: Cardiovascular Disease

## 2018-02-26 ENCOUNTER — Encounter: Payer: Self-pay | Admitting: Internal Medicine

## 2018-02-27 ENCOUNTER — Other Ambulatory Visit: Payer: Self-pay | Admitting: Cardiovascular Disease

## 2018-02-27 MED ORDER — TAMSULOSIN HCL 0.4 MG PO CAPS: 0.4000 mg | ORAL_CAPSULE | Freq: Every day | ORAL | 5 refills | Status: DC

## 2018-02-27 MED ORDER — CLOPIDOGREL BISULFATE 75 MG PO TABS: 75.0000 mg | ORAL_TABLET | Freq: Every day | ORAL | 0 refills | Status: DC

## 2018-02-27 MED ORDER — ROSUVASTATIN CALCIUM 5 MG PO TABS: ORAL_TABLET | ORAL | 0 refills | Status: DC

## 2018-02-27 NOTE — Therapy (Signed)
[facility] [address] [place], [place], [address] Phone: [phone]   Fax:  [phone]  Physical Therapy Treatment  Patient Details  Name: Larry [name] MRN: [id number] Date of Birth: 24-Jul-1932 Referring Provider: Jovita Gamma, MD   Encounter Date: 02/24/2018  PT End of Session - 02/27/18 0752    Visit Number  9    Date for PT Re-Evaluation  03/22/18    Authorization Type  Medicare    PT Start Time  1059    PT Stop Time  1140    PT Time Calculation (min)  41 min    Activity Tolerance  Patient tolerated treatment well    Behavior During Therapy  [facility] for tasks assessed/performed       Past Medical History:  Diagnosis Date  . Allergic rhinitis, cause unspecified 03/12/2013  . Allergy   . CAD (coronary artery disease)    S/P CABG in 1999   . Cataract   . Decreased exercise tolerance    Exertional fatigue  . Degenerative arthritis of hip   . Diverticulosis of colon   . ED (erectile dysfunction)   . Erectile dysfunction 06/03/2014  . History of colonic polyps   . History of kidney stones   . History of MI (myocardial infarction)   . Hx of adenomatous colonic polyps 2000  . Hyperlipidemia    Low HDL  . Lumbar disc disease    Hx of with recent back pain and buttock pain  . Myocardial infarction ([place]) 1999  . Right knee meniscal tear   . S/P removal of thyroid nodule   . Spinal stenosis of lumbar region 03/15/2014    Past Surgical History:  Procedure Laterality Date  . CARDIAC CATHETERIZATION    . CATARACT EXTRACTION    . COLONOSCOPY    . CORONARY ARTERY BYPASS GRAFT  1999  . CYSTOSCOPY/URETEROSCOPY/HOLMIUM LASER/STENT PLACEMENT Left 12/23/2017   Procedure: CYSTOSCOPY/RETROGRADE/URETEROSCOPY/HOLMIUM LASER/STENT PLACEMENT;  Surgeon: Festus Aloe, MD;  [facility];  Service: Urology;  Laterality: Left;  ONLY NEEDS 60 MIN  . LUMBAR DISC SURGERY    . Nodules on Thyroid    . POLYPECTOMY    .  PTCA    . SHOULDER ARTHROSCOPY WITH ROTATOR CUFF REPAIR AND SUBACROMIAL DECOMPRESSION Right 01/30/2015   Procedure: RIGHT ARTHROSCOPY SHOULDER SUBACROMIAL DECOMPRESSION DISTAL CLAVICAL RESECTION RETATOR CUFF REPAIR;  Surgeon: Justice Britain, MD;  Location: [place];  Service: Orthopedics;  Laterality: Right;  . TONSILLECTOMY    . VASECTOMY      There were no vitals filed for this visit.  Subjective Assessment - 02/27/18 0748    Subjective  Pt reports things are going well. He continues to work on his exercises at home.    Currently in Pain?  No/denies                      [facility] Adult PT Treatment/Exercise - 02/27/18 0001      Lumbar Exercises: Seated   Other Seated Lumbar Exercises  BUE pressdown on foam roll with verbal cues for upright posture, x15 reps.       Knee/Hip Exercises: Standing   Forward Step Up  2 sets;10 reps;Hand Hold: 1;Step Height: 4";Both;Limitations    Forward Step Up Limitations  contralateral hip flexion    Other Standing Knee Exercises  Sidestepping at countertop with BUE support and red TB around toes, x2 trials Lt/Rt      Knee/Hip Exercises: Seated   Long [employer]  2 sets;15 reps;Limitations    Long Arc Quad Weight  4 lbs.    Long [employer] Limitations  alternating with 2 sets of marching    Marching  Both;2 sets;15 reps    Marching Limitations  single leg at a time, 4# ankle weight        6 MWT 832ft, 1 rest break     PT Education - 02/27/18 0749    Education provided  Yes    Education Details  upcoming re-evaluation to determine need for more PT; encouraged pt to monitor/time his walks for better goal setting    Person(s) Educated  Patient;Spouse    Methods  Explanation    Comprehension  Verbalized understanding       PT Short Term Goals - 02/24/18 1109      PT SHORT TERM GOAL #1   Title  be independent in initial HEP    Time  4    Period  Weeks    Status  Achieved      PT SHORT TERM GOAL #2   Title  improve LE strength and  endurance to stand for 15-20 minutes without rest    Time  4    Period  Weeks    Status  Achieved      PT SHORT TERM GOAL #3   Title  improve 6 minute walk test to ambulate 750 feet     Baseline  850ft, 1 rest break     Time  4    Period  Weeks    Status  On-going      PT SHORT TERM GOAL #4   Title  demonstrate bilateral heel strike and normalized step length on level surface to improve safety    Time  4    Period  Weeks    Status  On-going        PT Long Term Goals - 02/09/18 1245      PT LONG TERM GOAL #1   Title  be independent in advanced HEP    Time  8    Period  Weeks    Status  On-going      PT LONG TERM GOAL #2   Title  reduce FOTO to < or = to 41% limitation    Time  8    Period  Weeks    Status  On-going      PT LONG TERM GOAL #3   Title  improve LE strength and endurance to walk for 15 minutes in the community without need to rest    Time  8    Period  Weeks    Status  On-going      PT LONG TERM GOAL #4   Title  improve 6 minute walk test to ambulate 800 feet     Time  8    Period  Weeks    Status  On-going      PT LONG TERM GOAL #5   Title  demonstrate 4+/5 bil LE strength to improve safety and endurance on unlevel surfaces    Time  8    Period  Weeks    Status  On-going            Plan - 02/27/18 0752    Clinical Impression Statement  Pt is making steady progress towards his goals. Completed 6 minute walk test, with requiring 1 rest break and demonstrating the ability to walk atleast 865ft with his SPC. Therapist discussed ways to progress pt's  walking at the park and encouraged him to use a timer for easier goal setting. He verbalized understanding of this. Continued with therex to promote LE strength and endurance, with intermittent cues to improve his posture awareness. Ended session without reports of pain.     Rehab Potential  Good    PT Frequency  2x / week    PT Duration  8 weeks    PT Treatment/Interventions  ADLs/Self Care Home  Management;Cryotherapy;Moist Heat;Balance training;Therapeutic exercise;Functional mobility training;Therapeutic activities;Gait training;Stair training;Neuromuscular re-education;Patient/family education;Passive range of motion;Manual techniques    PT Next Visit Plan  progress endurance/activity;  LE strengthening (hip extensors/abductors), balance, gait    Consulted and Agree with Plan of Care  Patient       Patient will benefit from skilled therapeutic intervention in order to improve the following deficits and impairments:  Abnormal gait, Difficulty walking, Decreased balance, Decreased activity tolerance, Decreased mobility, Decreased coordination, Postural dysfunction  Visit Diagnosis: Muscle weakness (generalized)  Other abnormalities of gait and mobility     Problem List Patient Active Problem List   Diagnosis Date Noted  . Gait disorder 04/20/2016  . Hyperglycemia 04/20/2016  . Abnormal CT scan, chest 06/03/2014  . Erectile dysfunction 06/03/2014  . Spinal stenosis of lumbar region 03/15/2014  . Cough 03/12/2013  . Allergic rhinitis 03/12/2013  . Leg pain, bilateral 12/30/2011  . Bladder neck obstruction 12/30/2011  . Preventative health care 12/25/2011  . Lumbar disc disease 12/25/2011  . S/P removal of thyroid nodule 12/25/2011  . History of MI (myocardial infarction) 12/25/2011  . Right knee meniscal tear 12/25/2011  . Degenerative arthritis of hip 12/25/2011  . HIP PAIN, RIGHT 05/14/2010  . FATIGUE 10/13/2009  . HYPERTENSION, BENIGN 02/25/2009  . CAD, AUTOLOGOUS BYPASS GRAFT 02/25/2009  . CORONARY ARTERY ANEURYSM 02/25/2009  . BACK PAIN 04/15/2008  . Hyperlipidemia 02/13/2008  . CORONARY ARTERY DISEASE 02/13/2008  . DIVERTICULOSIS, COLON 02/13/2008  . CARDIAC DISEASE, HX OF 02/13/2008  . COLONIC POLYPS, HX OF 02/13/2008   7:53 AM,02/27/18 Sherol Dade PT, DPT [facility] [address], [place], [place], [address] Phone: [phone]   Fax:  [phone]  Name: Izel Eisenhardt MRN: [id number] Date of Birth: Apr 24, 1932

## 2018-02-27 NOTE — Telephone Encounter (Signed)
Pt's medications were sent to pt's pharmacy as requested. Confirmation received.  

## 2018-02-28 ENCOUNTER — Ambulatory Visit: Payer: Medicare Other | Admitting: Physical Therapy

## 2018-02-28 ENCOUNTER — Encounter: Payer: Self-pay | Admitting: Physical Therapy

## 2018-02-28 DIAGNOSIS — R2689 Other abnormalities of gait and mobility: Secondary | ICD-10-CM

## 2018-02-28 DIAGNOSIS — M6281 Muscle weakness (generalized): Secondary | ICD-10-CM

## 2018-02-28 NOTE — Therapy (Signed)
[facility] [address], [place], [place], [address] Phone: [phone]   Fax:  [phone]  Physical Therapy Treatment  Patient Details  Name: Larry [name] MRN: [id number] Date of Birth: Jul 31, 1932 Referring Provider: Jovita Gamma, MD   Encounter Date: 02/28/2018  PT End of Session - 02/28/18 1108    Visit Number  10    Date for PT Re-Evaluation  03/22/18    Authorization Type  Medicare    PT Start Time  1102    PT Stop Time  1145    PT Time Calculation (min)  43 min    Activity Tolerance  Patient tolerated treatment well;No increased pain    Behavior During Therapy  [facility] for tasks assessed/performed       Past Medical History:  Diagnosis Date  . Allergic rhinitis, cause unspecified 03/12/2013  . Allergy   . CAD (coronary artery disease)    S/P CABG in 1999   . Cataract   . Decreased exercise tolerance    Exertional fatigue  . Degenerative arthritis of hip   . Diverticulosis of colon   . ED (erectile dysfunction)   . Erectile dysfunction 06/03/2014  . History of colonic polyps   . History of kidney stones   . History of MI (myocardial infarction)   . Hx of adenomatous colonic polyps 2000  . Hyperlipidemia    Low HDL  . Lumbar disc disease    Hx of with recent back pain and buttock pain  . Myocardial infarction ([place]) 1999  . Right knee meniscal tear   . S/P removal of thyroid nodule   . Spinal stenosis of lumbar region 03/15/2014    Past Surgical History:  Procedure Laterality Date  . CARDIAC CATHETERIZATION    . CATARACT EXTRACTION    . COLONOSCOPY    . CORONARY ARTERY BYPASS GRAFT  1999  . CYSTOSCOPY/URETEROSCOPY/HOLMIUM LASER/STENT PLACEMENT Left 12/23/2017   Procedure: CYSTOSCOPY/RETROGRADE/URETEROSCOPY/HOLMIUM LASER/STENT PLACEMENT;  Surgeon: Festus Aloe, MD;  [facility];  Service: Urology;  Laterality: Left;  ONLY NEEDS 60 MIN  . LUMBAR DISC SURGERY    . Nodules on Thyroid     . POLYPECTOMY    . PTCA    . SHOULDER ARTHROSCOPY WITH ROTATOR CUFF REPAIR AND SUBACROMIAL DECOMPRESSION Right 01/30/2015   Procedure: RIGHT ARTHROSCOPY SHOULDER SUBACROMIAL DECOMPRESSION DISTAL CLAVICAL RESECTION RETATOR CUFF REPAIR;  Surgeon: Justice Britain, MD;  Location: [place];  Service: Orthopedics;  Laterality: Right;  . TONSILLECTOMY    . VASECTOMY      There were no vitals filed for this visit.  Subjective Assessment - 02/28/18 1102    Subjective  Pt reports that he is extra clumsy today. He continues to do his exercises dilligently. He was going to go walking yesterday, but it started raining.     Currently in Pain?  No/denies                      [facility] Adult PT Treatment/Exercise - 02/28/18 0001      Lumbar Exercises: Aerobic   Nustep  intervals xx10 min, L2 to L5 every 1 minute.       Lumbar Exercises: Standing   Other Standing Lumbar Exercises  BUE support with alternating marches 2x10 reps, 2nd set without UE support      Lumbar Exercises: Supine   Ab Set  15 reps;2 seconds;Limitations    AB Set Limitations  blood pressure cuff feedback increasing 10 mmHg  Bent Knee Raise Limitations  deep abdominal activation with cuff feedback x10 reps on Lt, x10 reps on Rt noting increased fatigue      Knee/Hip Exercises: Standing   Forward Step Up  2 sets;Both;Hand Hold: 1;Step Height: 6";10 reps 2# ankle weights     Forward Step Up Limitations  contralateral hip flexion, CGA          Balance Exercises - 02/28/18 1138      Balance Exercises: Standing   Tandem Gait  Forward;2 reps CGA to MinA        PT Education - 02/28/18 1150    Education provided  Yes    Education Details  activation of deep abdominals     Person(s) Educated  Patient    Methods  Explanation;Verbal cues;Tactile cues    Comprehension  Verbalized understanding;Returned demonstration;Need further instruction       PT Short Term Goals - 02/28/18 1111      PT SHORT TERM GOAL #1    Title  be independent in initial HEP    Time  4    Period  Weeks    Status  Achieved      PT SHORT TERM GOAL #2   Title  improve LE strength and endurance to stand for 15-20 minutes without rest    Time  4    Period  Weeks    Status  Achieved      PT SHORT TERM GOAL #3   Title  improve 6 minute walk test to ambulate 750 feet     Baseline  864ft, 1 rest break     Time  4    Period  Weeks    Status  Achieved      PT SHORT TERM GOAL #4   Title  demonstrate bilateral heel strike and normalized step length on level surface to improve safety    Time  4    Period  Weeks    Status  On-going        PT Long Term Goals - 02/09/18 1245      PT LONG TERM GOAL #1   Title  be independent in advanced HEP    Time  8    Period  Weeks    Status  On-going      PT LONG TERM GOAL #2   Title  reduce FOTO to < or = to 41% limitation    Time  8    Period  Weeks    Status  On-going      PT LONG TERM GOAL #3   Title  improve LE strength and endurance to walk for 15 minutes in the community without need to rest    Time  8    Period  Weeks    Status  On-going      PT LONG TERM GOAL #4   Title  improve 6 minute walk test to ambulate 800 feet     Time  8    Period  Weeks    Status  On-going      PT LONG TERM GOAL #5   Title  demonstrate 4+/5 bil LE strength to improve safety and endurance on unlevel surfaces    Time  8    Period  Weeks    Status  On-going            Plan - 02/28/18 1151    Clinical Impression Statement  Today's session continued with focus on improving trunk strength and single  leg stability. Pt demonstrated improved understanding of proper abdominal activation with the addition of blood pressure cuff feedback. He continues to require rest breaks due to LE fatigue, but was able to complete increased step height during step ups this session. He will continue to benefit from skilled PT to address strength, endurance and proprioception deficits.     Rehab Potential   Good    PT Frequency  2x / week    PT Duration  8 weeks    PT Treatment/Interventions  ADLs/Self Care Home Management;Cryotherapy;Moist Heat;Balance training;Therapeutic exercise;Functional mobility training;Therapeutic activities;Gait training;Stair training;Neuromuscular re-education;Patient/family education;Passive range of motion;Manual techniques    PT Next Visit Plan  set walking goals for the park; progress endurance/activity; LE strengthening (hip extensors/abductors), balance, gait    Consulted and Agree with Plan of Care  Patient       Patient will benefit from skilled therapeutic intervention in order to improve the following deficits and impairments:  Abnormal gait, Difficulty walking, Decreased balance, Decreased activity tolerance, Decreased mobility, Decreased coordination, Postural dysfunction  Visit Diagnosis: Muscle weakness (generalized)  Other abnormalities of gait and mobility     Problem List Patient Active Problem List   Diagnosis Date Noted  . Gait disorder 04/20/2016  . Hyperglycemia 04/20/2016  . Abnormal CT scan, chest 06/03/2014  . Erectile dysfunction 06/03/2014  . Spinal stenosis of lumbar region 03/15/2014  . Cough 03/12/2013  . Allergic rhinitis 03/12/2013  . Leg pain, bilateral 12/30/2011  . Bladder neck obstruction 12/30/2011  . Preventative health care 12/25/2011  . Lumbar disc disease 12/25/2011  . S/P removal of thyroid nodule 12/25/2011  . History of MI (myocardial infarction) 12/25/2011  . Right knee meniscal tear 12/25/2011  . Degenerative arthritis of hip 12/25/2011  . HIP PAIN, RIGHT 05/14/2010  . FATIGUE 10/13/2009  . HYPERTENSION, BENIGN 02/25/2009  . CAD, AUTOLOGOUS BYPASS GRAFT 02/25/2009  . CORONARY ARTERY ANEURYSM 02/25/2009  . BACK PAIN 04/15/2008  . Hyperlipidemia 02/13/2008  . CORONARY ARTERY DISEASE 02/13/2008  . DIVERTICULOSIS, COLON 02/13/2008  . CARDIAC DISEASE, HX OF 02/13/2008  . COLONIC POLYPS, HX OF 02/13/2008     11:56 AM,02/28/18 Sherol Dade PT, DPT [facility] [place]  [facility] [address] [place], [place], [address] Phone: [phone]   Fax:  [phone]  Name: Larry [name] MRN: [id number] Date of Birth: 1932-04-21

## 2018-03-02 ENCOUNTER — Ambulatory Visit: Payer: Medicare Other

## 2018-03-02 DIAGNOSIS — N200 Calculus of kidney: Secondary | ICD-10-CM | POA: Diagnosis not present

## 2018-03-02 DIAGNOSIS — M6281 Muscle weakness (generalized): Secondary | ICD-10-CM | POA: Diagnosis not present

## 2018-03-02 DIAGNOSIS — R2689 Other abnormalities of gait and mobility: Secondary | ICD-10-CM

## 2018-03-02 NOTE — Therapy (Signed)
[facility] [address] [place], [place], [address] Phone: [phone]   Fax:  [phone]  Physical Therapy Treatment  Patient Details  Name: Larry [name] MRN: [id number] Date of Birth: 07-Sep-1932 Referring Provider: Jovita Gamma, MD   Encounter Date: 03/02/2018  PT End of Session - 03/02/18 1241    Visit Number  11    Date for PT Re-Evaluation  03/22/18    Authorization Type  Medicare: KX at 89    PT Start Time  1146    PT Stop Time  1232    PT Time Calculation (min)  46 min    Activity Tolerance  Patient tolerated treatment well    Behavior During Therapy  [facility] for tasks assessed/performed       Past Medical History:  Diagnosis Date  . Allergic rhinitis, cause unspecified 03/12/2013  . Allergy   . CAD (coronary artery disease)    S/P CABG in 1999   . Cataract   . Decreased exercise tolerance    Exertional fatigue  . Degenerative arthritis of hip   . Diverticulosis of colon   . ED (erectile dysfunction)   . Erectile dysfunction 06/03/2014  . History of colonic polyps   . History of kidney stones   . History of MI (myocardial infarction)   . Hx of adenomatous colonic polyps 2000  . Hyperlipidemia    Low HDL  . Lumbar disc disease    Hx of with recent back pain and buttock pain  . Myocardial infarction ([place]) 1999  . Right knee meniscal tear   . S/P removal of thyroid nodule   . Spinal stenosis of lumbar region 03/15/2014    Past Surgical History:  Procedure Laterality Date  . CARDIAC CATHETERIZATION    . CATARACT EXTRACTION    . COLONOSCOPY    . CORONARY ARTERY BYPASS GRAFT  1999  . CYSTOSCOPY/URETEROSCOPY/HOLMIUM LASER/STENT PLACEMENT Left 12/23/2017   Procedure: CYSTOSCOPY/RETROGRADE/URETEROSCOPY/HOLMIUM LASER/STENT PLACEMENT;  Surgeon: Festus Aloe, MD;  Location: [facility];  Service: Urology;  Laterality: Left;  ONLY NEEDS 60 MIN  . LUMBAR DISC SURGERY    . Nodules on Thyroid    .  POLYPECTOMY    . PTCA    . SHOULDER ARTHROSCOPY WITH ROTATOR CUFF REPAIR AND SUBACROMIAL DECOMPRESSION Right 01/30/2015   Procedure: RIGHT ARTHROSCOPY SHOULDER SUBACROMIAL DECOMPRESSION DISTAL CLAVICAL RESECTION RETATOR CUFF REPAIR;  Surgeon: Justice Britain, MD;  Location: [facility];  Service: Orthopedics;  Laterality: Right;  . TONSILLECTOMY    . VASECTOMY      There were no vitals filed for this visit.  Subjective Assessment - 03/02/18 1156    Subjective  I feel like I am getting stronger.  I am going on vacation for 2 weeks.      Patient Stated Goals  improve endurance for standing and walking    Currently in Pain?  No/denies                      [facility] Adult PT Treatment/Exercise - 03/02/18 0001      Lumbar Exercises: Aerobic   Nustep  Level 2x 8 minutes      Knee/Hip Exercises: Standing   Heel Raises  Both;2 sets;10 reps    Hip Abduction  Stengthening;Both;2 sets;10 reps    Abduction Limitations  4# added    Hip Extension  Stengthening;Both;2 sets;10 reps    Extension Limitations  4# added    Forward Step Up  2 sets;Both;Hand Hold: 1;Step  Height: 6";10 reps 2# ankle weights     Forward Step Up Limitations  contralateral hip flexion, CGA    Other Standing Knee Exercises  weaving in and out of dark blocks on the floor, walking with head turns- PT with CGA on gait belt      Knee/Hip Exercises: Seated   Long Arc Quad  2 sets;15 reps;Limitations    Long Arc Quad Weight  4 lbs.               PT Short Term Goals - 02/28/18 1111      PT SHORT TERM GOAL #1   Title  be independent in initial HEP    Time  4    Period  Weeks    Status  Achieved      PT SHORT TERM GOAL #2   Title  improve LE strength and endurance to stand for 15-20 minutes without rest    Time  4    Period  Weeks    Status  Achieved      PT SHORT TERM GOAL #3   Title  improve 6 minute walk test to ambulate 750 feet     Baseline  850ft, 1 rest break     Time  4    Period  Weeks    Status   Achieved      PT SHORT TERM GOAL #4   Title  demonstrate bilateral heel strike and normalized step length on level surface to improve safety    Time  4    Period  Weeks    Status  On-going        PT Long Term Goals - 02/09/18 1245      PT LONG TERM GOAL #1   Title  be independent in advanced HEP    Time  8    Period  Weeks    Status  On-going      PT LONG TERM GOAL #2   Title  reduce FOTO to < or = to 41% limitation    Time  8    Period  Weeks    Status  On-going      PT LONG TERM GOAL #3   Title  improve LE strength and endurance to walk for 15 minutes in the community without need to rest    Time  8    Period  Weeks    Status  On-going      PT LONG TERM GOAL #4   Title  improve 6 minute walk test to ambulate 800 feet     Time  8    Period  Weeks    Status  On-going      PT LONG TERM GOAL #5   Title  demonstrate 4+/5 bil LE strength to improve safety and endurance on unlevel surfaces    Time  8    Period  Weeks    Status  On-going            Plan - 03/02/18 1157    Clinical Impression Statement  Pt continues to demonstrate endurance deficits and balance deficits.  Pt with improved 6 minute walk test to 850 feet last session showing improved endurance.  PT continues to focus on balance, LE strength and proper abdominal activation.  Pt will be on vacation x 2 weeks and will return for reassessment 03/21/18.  Pt will benefit from skilled PT for strength, gait and endurance.      Rehab Potential  Good  PT Frequency  2x / week    PT Duration  8 weeks    PT Treatment/Interventions  ADLs/Self Care Home Management;Cryotherapy;Moist Heat;Balance training;Therapeutic exercise;Functional mobility training;Therapeutic activities;Gait training;Stair training;Neuromuscular re-education;Patient/family education;Passive range of motion;Manual techniques    PT Next Visit Plan  reassessment after vacation, LE strength and endurance    Consulted and Agree with Plan of Care   Patient       Patient will benefit from skilled therapeutic intervention in order to improve the following deficits and impairments:  Abnormal gait, Difficulty walking, Decreased balance, Decreased activity tolerance, Decreased mobility, Decreased coordination, Postural dysfunction  Visit Diagnosis: Muscle weakness (generalized)  Other abnormalities of gait and mobility     Problem List Patient Active Problem List   Diagnosis Date Noted  . Gait disorder 04/20/2016  . Hyperglycemia 04/20/2016  . Abnormal CT scan, chest 06/03/2014  . Erectile dysfunction 06/03/2014  . Spinal stenosis of lumbar region 03/15/2014  . Cough 03/12/2013  . Allergic rhinitis 03/12/2013  . Leg pain, bilateral 12/30/2011  . Bladder neck obstruction 12/30/2011  . Preventative health care 12/25/2011  . Lumbar disc disease 12/25/2011  . S/P removal of thyroid nodule 12/25/2011  . History of MI (myocardial infarction) 12/25/2011  . Right knee meniscal tear 12/25/2011  . Degenerative arthritis of hip 12/25/2011  . HIP PAIN, RIGHT 05/14/2010  . FATIGUE 10/13/2009  . HYPERTENSION, BENIGN 02/25/2009  . CAD, AUTOLOGOUS BYPASS GRAFT 02/25/2009  . CORONARY ARTERY ANEURYSM 02/25/2009  . BACK PAIN 04/15/2008  . Hyperlipidemia 02/13/2008  . CORONARY ARTERY DISEASE 02/13/2008  . DIVERTICULOSIS, COLON 02/13/2008  . CARDIAC DISEASE, HX OF 02/13/2008  . COLONIC POLYPS, HX OF 02/13/2008    Larry [name], PT 03/02/18 12:44 PM  [facility] [address], [place], [place], [address] Phone: [phone]   Fax:  [phone]  Name: Larry [name] MRN: [id number] Date of Birth: [birth year]

## 2018-03-21 ENCOUNTER — Ambulatory Visit: Payer: Medicare Other | Attending: Neurosurgery

## 2018-03-21 DIAGNOSIS — R2689 Other abnormalities of gait and mobility: Secondary | ICD-10-CM

## 2018-03-21 DIAGNOSIS — M6281 Muscle weakness (generalized): Secondary | ICD-10-CM

## 2018-03-21 NOTE — Therapy (Signed)
[facility] [address] [place], [place], [address] Phone: [phone]   Fax:  [phone]  Physical Therapy Treatment  Patient Details  Name: Larry [name] MRN: [id number] Date of Birth: 03-10-1932 Referring Provider: Jovita Gamma, MD   Encounter Date: 03/21/2018  PT End of Session - 03/21/18 1313    Visit Number  12    Date for PT Re-Evaluation  05/16/18    Authorization Type  Medicare: KX at 1    PT Start Time  5686    PT Stop Time  1312    PT Time Calculation (min)  37 min    Activity Tolerance  Patient tolerated treatment well    Behavior During Therapy  [facility] for tasks assessed/performed       Past Medical History:  Diagnosis Date  . Allergic rhinitis, cause unspecified 03/12/2013  . Allergy   . CAD (coronary artery disease)    S/P CABG in 1999   . Cataract   . Decreased exercise tolerance    Exertional fatigue  . Degenerative arthritis of hip   . Diverticulosis of colon   . ED (erectile dysfunction)   . Erectile dysfunction 06/03/2014  . History of colonic polyps   . History of kidney stones   . History of MI (myocardial infarction)   . Hx of adenomatous colonic polyps 2000  . Hyperlipidemia    Low HDL  . Lumbar disc disease    Hx of with recent back pain and buttock pain  . Myocardial infarction ([place]) 1999  . Right knee meniscal tear   . S/P removal of thyroid nodule   . Spinal stenosis of lumbar region 03/15/2014    Past Surgical History:  Procedure Laterality Date  . CARDIAC CATHETERIZATION    . CATARACT EXTRACTION    . COLONOSCOPY    . CORONARY ARTERY BYPASS GRAFT  1999  . CYSTOSCOPY/URETEROSCOPY/HOLMIUM LASER/STENT PLACEMENT Left 12/23/2017   Procedure: CYSTOSCOPY/RETROGRADE/URETEROSCOPY/HOLMIUM LASER/STENT PLACEMENT;  Surgeon: Festus Aloe, MD;  Location: [facility];  Service: Urology;  Laterality: Left;  ONLY NEEDS 60 MIN  . LUMBAR DISC SURGERY    . Nodules on Thyroid    .  POLYPECTOMY    . PTCA    . SHOULDER ARTHROSCOPY WITH ROTATOR CUFF REPAIR AND SUBACROMIAL DECOMPRESSION Right 01/30/2015   Procedure: RIGHT ARTHROSCOPY SHOULDER SUBACROMIAL DECOMPRESSION DISTAL CLAVICAL RESECTION RETATOR CUFF REPAIR;  Surgeon: Justice Britain, MD;  Location: [facility];  Service: Orthopedics;  Laterality: Right;  . TONSILLECTOMY    . VASECTOMY      There were no vitals filed for this visit.  Subjective Assessment - 03/21/18 1316    Subjective  I am definitely getting stronger with PT.    Diagnostic tests  Lumbar stenosis at L3-4 and L4-5, degenerative spondylolisthesis L3-4 and L4-5    Patient Stated Goals  improve endurance for standing and walking    Currently in Pain?  No/denies         [facility] PT Assessment - 03/21/18 0001      Assessment   Medical Diagnosis  spinal stenosis, lumbar region with neurogenic claudication      Prior Function   Level of Independence  Independent      Cognition   Overall Cognitive Status  Within Functional Limits for tasks assessed      AROM   Overall AROM   Within functional limits for tasks performed      Strength   Overall Strength  Deficits    Right/Left Hip  Right;Left    Right Hip Flexion  4+/5    Left Hip Flexion  4+/5    Right Knee Flexion  4+/5    Right Knee Extension  4/5    Left Knee Flexion  4+/5    Left Knee Extension  5/5    Right Ankle Dorsiflexion  4/5    Left Ankle Dorsiflexion  4+/5      Transfers   Five time sit to stand comments   10.57      6 minute walk test results    Aerobic Endurance Distance Walked  850    Endurance additional comments  1 rest break at 3 min                   [facility] Adult PT Treatment/Exercise - 03/21/18 0001      Lumbar Exercises: Aerobic   Nustep  Level 2x 10 minutes PT present to discuss progress      Knee/Hip Exercises: Standing   Heel Raises  Both;2 sets;10 reps    Hip Abduction  Stengthening;Both;2 sets;10 reps    Abduction Limitations  4# added    Hip  Extension  Stengthening;Both;2 sets;10 reps    Extension Limitations  4# added    Forward Step Up  2 sets;Both;Hand Hold: 1;Step Height: 6";10 reps 2# ankle weights     Forward Step Up Limitations  contralateral hip flexion, CGA    Other Standing Knee Exercises  weaving in and out of dark blocks on the floor, tandem line walk- PT with CGA on gait belt               PT Short Term Goals - 03/21/18 1240      PT SHORT TERM GOAL #2   Title  improve LE strength and endurance to stand for 15-20 minutes without rest    Baseline  15 minutes for shaving and showering    Status  Achieved      PT SHORT TERM GOAL #3   Title  improve 6 minute walk test to ambulate 750 feet     Status  Achieved        PT Long Term Goals - 03/21/18 1241      PT LONG TERM GOAL #1   Title  be independent in advanced HEP    Time  8    Period  Weeks    Target Date  05/16/18      PT LONG TERM GOAL #2   Title  reduce FOTO to < or = to 41% limitation    Baseline  60% limitation    Status  Deferred not met.  Not having pain      PT LONG TERM GOAL #3   Title  improve LE strength and endurance to walk for 15 minutes in the community without need to rest    Baseline  10 minutes    Time  8    Period  Weeks    Status  On-going    Target Date  05/16/18      PT LONG TERM GOAL #4   Title  improve 6 minute walk test to ambulate > or = to 1000  feet     Status  Achieved      PT LONG TERM GOAL #5   Title  demonstrate 4+/5 bil LE strength to improve safety and endurance on unlevel surfaces    Time  8    Status  On-going    Target Date  05/16/18  Additional Long Term Goals   Additional Long Term Goals  Yes      PT LONG TERM GOAL #6   Title  improve LE strength to squat to a low cabinet with safety and demonstrate ability to rise to standing from this position    Time  8    Period  Weeks    Status  On-going    Target Date  05/16/18            Plan - 03/21/18 1250    Clinical Impression  Statement  Pt is making steady progress with endurance, strength, gait and safety.  Pt with slow progression due to chronic nature of condition.  Pt improved 6 minute walk test to < 800 feet and will work to increase this endurance for improved community safety.  Pt with reduced hip and knee strength and will conitnue to benefit from continued PT to improve strength to improve squatting, endurance and standing for safety and independence at home and in the community.      Rehab Potential  Good    PT Frequency  2x / week    PT Duration  8 weeks    PT Treatment/Interventions  ADLs/Self Care Home Management;Cryotherapy;Moist Heat;Balance training;Therapeutic exercise;Functional mobility training;Therapeutic activities;Gait training;Stair training;Neuromuscular re-education;Patient/family education;Passive range of motion;Manual techniques    PT Next Visit Plan  LE strength, sit to stand, balance, gait with heel to toe progression    Recommended Other Services  initial cert is signed, recert sent 0/9/98    Consulted and Agree with Plan of Care  Patient       Patient will benefit from skilled therapeutic intervention in order to improve the following deficits and impairments:  Abnormal gait, Difficulty walking, Decreased balance, Decreased activity tolerance, Decreased mobility, Decreased coordination, Postural dysfunction  Visit Diagnosis: Muscle weakness (generalized) - Plan: PT plan of care cert/re-cert  Other abnormalities of gait and mobility - Plan: PT plan of care cert/re-cert     Problem List Patient Active Problem List   Diagnosis Date Noted  . Gait disorder 04/20/2016  . Hyperglycemia 04/20/2016  . Abnormal CT scan, chest 06/03/2014  . Erectile dysfunction 06/03/2014  . Spinal stenosis of lumbar region 03/15/2014  . Cough 03/12/2013  . Allergic rhinitis 03/12/2013  . Leg pain, bilateral 12/30/2011  . Bladder neck obstruction 12/30/2011  . Preventative health care 12/25/2011  .  Lumbar disc disease 12/25/2011  . S/P removal of thyroid nodule 12/25/2011  . History of MI (myocardial infarction) 12/25/2011  . Right knee meniscal tear 12/25/2011  . Degenerative arthritis of hip 12/25/2011  . HIP PAIN, RIGHT 05/14/2010  . FATIGUE 10/13/2009  . HYPERTENSION, BENIGN 02/25/2009  . CAD, AUTOLOGOUS BYPASS GRAFT 02/25/2009  . CORONARY ARTERY ANEURYSM 02/25/2009  . BACK PAIN 04/15/2008  . Hyperlipidemia 02/13/2008  . CORONARY ARTERY DISEASE 02/13/2008  . DIVERTICULOSIS, COLON 02/13/2008  . CARDIAC DISEASE, HX OF 02/13/2008  . COLONIC POLYPS, HX OF 02/13/2008     Sigurd Sos, PT 03/21/18 1:17 PM  [facility] [address], [place], [place], [address] Phone: [phone]   Fax:  [phone]  Name: Larry [name] MRN: [id number] Date of Birth: 11-21-1932

## 2018-03-22 ENCOUNTER — Ambulatory Visit: Payer: Medicare Other

## 2018-03-23 ENCOUNTER — Ambulatory Visit: Payer: Medicare Other

## 2018-03-23 DIAGNOSIS — M6281 Muscle weakness (generalized): Secondary | ICD-10-CM | POA: Diagnosis not present

## 2018-03-23 DIAGNOSIS — R2689 Other abnormalities of gait and mobility: Secondary | ICD-10-CM | POA: Diagnosis not present

## 2018-03-23 NOTE — Therapy (Signed)
[facility] [address] [place], [place], [address] Phone: [phone]   Fax:  [phone]  Physical Therapy Treatment  Patient Details  Name: Larry [name] MRN: [id number] Date of Birth: [birth year] Referring Provider: Jovita Gamma, MD   Encounter Date: 03/23/2018  PT End of Session - 03/23/18 1015    Visit Number  13    Date for PT Re-Evaluation  05/16/18    Authorization Type  Medicare: KX at 85    PT Start Time  0931    PT Stop Time  1006    PT Time Calculation (min)  35 min    Activity Tolerance  Patient limited by fatigue    Behavior During Therapy  [facility] for tasks assessed/performed       Past Medical History:  Diagnosis Date  . Allergic rhinitis, cause unspecified 03/12/2013  . Allergy   . CAD (coronary artery disease)    S/P CABG in 1999   . Cataract   . Decreased exercise tolerance    Exertional fatigue  . Degenerative arthritis of hip   . Diverticulosis of colon   . ED (erectile dysfunction)   . Erectile dysfunction 06/03/2014  . History of colonic polyps   . History of kidney stones   . History of MI (myocardial infarction)   . Hx of adenomatous colonic polyps 2000  . Hyperlipidemia    Low HDL  . Lumbar disc disease    Hx of with recent back pain and buttock pain  . Myocardial infarction ([place]) 1999  . Right knee meniscal tear   . S/P removal of thyroid nodule   . Spinal stenosis of lumbar region 03/15/2014    Past Surgical History:  Procedure Laterality Date  . CARDIAC CATHETERIZATION    . CATARACT EXTRACTION    . COLONOSCOPY    . CORONARY ARTERY BYPASS GRAFT  1999  . CYSTOSCOPY/URETEROSCOPY/HOLMIUM LASER/STENT PLACEMENT Left 12/23/2017   Procedure: CYSTOSCOPY/RETROGRADE/URETEROSCOPY/HOLMIUM LASER/STENT PLACEMENT;  Surgeon: Festus Aloe, MD;  Location: [facility];  Service: Urology;  Laterality: Left;  ONLY NEEDS 60 MIN  . LUMBAR DISC SURGERY    . Nodules on Thyroid    . POLYPECTOMY     . PTCA    . SHOULDER ARTHROSCOPY WITH ROTATOR CUFF REPAIR AND SUBACROMIAL DECOMPRESSION Right 01/30/2015   Procedure: RIGHT ARTHROSCOPY SHOULDER SUBACROMIAL DECOMPRESSION DISTAL CLAVICAL RESECTION RETATOR CUFF REPAIR;  Surgeon: Justice Britain, MD;  Location: [facility];  Service: Orthopedics;  Laterality: Right;  . TONSILLECTOMY    . VASECTOMY      There were no vitals filed for this visit.  Subjective Assessment - 03/23/18 0940    Subjective  Doing well today.      Currently in Pain?  No/denies                       [facility] Adult PT Treatment/Exercise - 03/23/18 0001      Lumbar Exercises: Aerobic   Nustep  Level 2x 10 minutes PT present to discuss progress      Knee/Hip Exercises: Standing   Heel Raises  Both;2 sets;10 reps    Hip Abduction  Stengthening;Both;2 sets;10 reps    Abduction Limitations  4# added    Hip Extension  Stengthening;Both;2 sets;10 reps    Extension Limitations  4# added    Forward Step Up  --    Forward Step Up Limitations  --    Functional Squat  2 sets;10 reps mini squat with UE support  Other Standing Knee Exercises  weaving in and out of dark blocks on the floor, tandem line walk- PT with CGA on gait belt      Knee/Hip Exercises: Seated   Long Arc Quad  2 sets;15 reps;Limitations    Long Arc Quad Weight  4 lbs.               PT Short Term Goals - 03/21/18 1240      PT SHORT TERM GOAL #2   Title  improve LE strength and endurance to stand for 15-20 minutes without rest    Baseline  15 minutes for shaving and showering    Status  Achieved      PT SHORT TERM GOAL #3   Title  improve 6 minute walk test to ambulate 750 feet     Status  Achieved        PT Long Term Goals - 03/21/18 1241      PT LONG TERM GOAL #1   Title  be independent in advanced HEP    Time  8    Period  Weeks    Target Date  05/16/18      PT LONG TERM GOAL #2   Title  reduce FOTO to < or = to 41% limitation    Baseline  60% limitation    Status   Deferred not met.  Not having pain      PT LONG TERM GOAL #3   Title  improve LE strength and endurance to walk for 15 minutes in the community without need to rest    Baseline  10 minutes    Time  8    Period  Weeks    Status  On-going    Target Date  05/16/18      PT LONG TERM GOAL #4   Title  improve 6 minute walk test to ambulate > or = to 1000  feet     Status  Achieved      PT LONG TERM GOAL #5   Title  demonstrate 4+/5 bil LE strength to improve safety and endurance on unlevel surfaces    Time  8    Status  On-going    Target Date  05/16/18      Additional Long Term Goals   Additional Long Term Goals  Yes      PT LONG TERM GOAL #6   Title  improve LE strength to squat to a low cabinet with safety and demonstrate ability to rise to standing from this position    Time  8    Period  Weeks    Status  On-going    Target Date  05/16/18            Plan - 03/23/18 0945    Clinical Impression Statement  Pt is making steady progress with endurance, strength, gait and safety.  Pt with slow progression due to chronic nature of condition.  Pt with improved endurance for ambulation in the community.  Pt with increased fatigue today and required more frequent rest breaks.  Pt with reduced hip and knee strength and will continue to benefit from skilled PT to improve strength to improve squatting, endurance and standing for safety and independence at home and in the community.      Rehab Potential  Good    PT Frequency  2x / week    PT Duration  8 weeks    PT Treatment/Interventions  ADLs/Self Care Home Management;Cryotherapy;Moist Heat;Balance training;Therapeutic  exercise;Functional mobility training;Therapeutic activities;Gait training;Stair training;Neuromuscular re-education;Patient/family education;Passive range of motion;Manual techniques    PT Next Visit Plan  LE strength, sit to stand, balance, gait with heel to toe progression    Consulted and Agree with Plan of Care   Patient       Patient will benefit from skilled therapeutic intervention in order to improve the following deficits and impairments:  Abnormal gait, Difficulty walking, Decreased balance, Decreased activity tolerance, Decreased mobility, Decreased coordination, Postural dysfunction  Visit Diagnosis: Muscle weakness (generalized)  Other abnormalities of gait and mobility     Problem List Patient Active Problem List   Diagnosis Date Noted  . Gait disorder 04/20/2016  . Hyperglycemia 04/20/2016  . Abnormal CT scan, chest 06/03/2014  . Erectile dysfunction 06/03/2014  . Spinal stenosis of lumbar region 03/15/2014  . Cough 03/12/2013  . Allergic rhinitis 03/12/2013  . Leg pain, bilateral 12/30/2011  . Bladder neck obstruction 12/30/2011  . Preventative health care 12/25/2011  . Lumbar disc disease 12/25/2011  . S/P removal of thyroid nodule 12/25/2011  . History of MI (myocardial infarction) 12/25/2011  . Right knee meniscal tear 12/25/2011  . Degenerative arthritis of hip 12/25/2011  . HIP PAIN, RIGHT 05/14/2010  . FATIGUE 10/13/2009  . HYPERTENSION, BENIGN 02/25/2009  . CAD, AUTOLOGOUS BYPASS GRAFT 02/25/2009  . CORONARY ARTERY ANEURYSM 02/25/2009  . BACK PAIN 04/15/2008  . Hyperlipidemia 02/13/2008  . CORONARY ARTERY DISEASE 02/13/2008  . DIVERTICULOSIS, COLON 02/13/2008  . CARDIAC DISEASE, HX OF 02/13/2008  . COLONIC POLYPS, HX OF 02/13/2008     Sigurd Sos, PT 03/23/18 10:17 AM  [facility] [address] [place], [place], [address] Phone: [phone]   Fax:  [phone]  Name: Larry [name] MRN: [id number] Date of Birth: 11/13/1932

## 2018-03-27 ENCOUNTER — Ambulatory Visit: Payer: Medicare Other

## 2018-03-27 DIAGNOSIS — M6281 Muscle weakness (generalized): Secondary | ICD-10-CM

## 2018-03-27 DIAGNOSIS — R2689 Other abnormalities of gait and mobility: Secondary | ICD-10-CM

## 2018-03-27 NOTE — Therapy (Signed)
[facility] [address], [place], [place], [address] Phone: [phone]   Fax:  [phone]  Physical Therapy Treatment  Patient Details  Name: Larry [name] MRN: [id number] Date of Birth: 11-18-1932 Referring Provider: Jovita Gamma, MD   Encounter Date: 03/27/2018  PT End of Session - 03/27/18 1223    Visit Number  14    Date for PT Re-Evaluation  05/16/18    Authorization Type  Medicare: KX at 26    PT Start Time  1140    PT Stop Time  1224    PT Time Calculation (min)  44 min    Activity Tolerance  Patient tolerated treatment well    Behavior During Therapy  [facility] for tasks assessed/performed       Past Medical History:  Diagnosis Date  . Allergic rhinitis, cause unspecified 03/12/2013  . Allergy   . CAD (coronary artery disease)    S/P CABG in 1999   . Cataract   . Decreased exercise tolerance    Exertional fatigue  . Degenerative arthritis of hip   . Diverticulosis of colon   . ED (erectile dysfunction)   . Erectile dysfunction 06/03/2014  . History of colonic polyps   . History of kidney stones   . History of MI (myocardial infarction)   . Hx of adenomatous colonic polyps 2000  . Hyperlipidemia    Low HDL  . Lumbar disc disease    Hx of with recent back pain and buttock pain  . Myocardial infarction ([place]) 1999  . Right knee meniscal tear   . S/P removal of thyroid nodule   . Spinal stenosis of lumbar region 03/15/2014    Past Surgical History:  Procedure Laterality Date  . CARDIAC CATHETERIZATION    . CATARACT EXTRACTION    . COLONOSCOPY    . CORONARY ARTERY BYPASS GRAFT  1999  . CYSTOSCOPY/URETEROSCOPY/HOLMIUM LASER/STENT PLACEMENT Left 12/23/2017   Procedure: CYSTOSCOPY/RETROGRADE/URETEROSCOPY/HOLMIUM LASER/STENT PLACEMENT;  Surgeon: Festus Aloe, MD;  Location: [facility];  Service: Urology;  Laterality: Left;  ONLY NEEDS 60 MIN  . LUMBAR DISC SURGERY    . Nodules on Thyroid    .  POLYPECTOMY    . PTCA    . SHOULDER ARTHROSCOPY WITH ROTATOR CUFF REPAIR AND SUBACROMIAL DECOMPRESSION Right 01/30/2015   Procedure: RIGHT ARTHROSCOPY SHOULDER SUBACROMIAL DECOMPRESSION DISTAL CLAVICAL RESECTION RETATOR CUFF REPAIR;  Surgeon: Justice Britain, MD;  Location: [place];  Service: Orthopedics;  Laterality: Right;  . TONSILLECTOMY    . VASECTOMY      There were no vitals filed for this visit.  Subjective Assessment - 03/27/18 1150    Subjective  Doing well.  Still weak.    Diagnostic tests  Lumbar stenosis at L3-4 and L4-5, degenerative spondylolisthesis L3-4 and L4-5    Currently in Pain?  No/denies                       [facility] Adult PT Treatment/Exercise - 03/27/18 0001      Lumbar Exercises: Aerobic   Nustep  Level 2x 10 minutes PT present to discuss progress      Knee/Hip Exercises: Standing   Heel Raises  Both;2 sets;10 reps    Hip Abduction  Stengthening;Both;2 sets;10 reps    Abduction Limitations  4# added    Hip Extension  Stengthening;Both;2 sets;10 reps    Extension Limitations  4# added    Functional Squat  2 sets;10 reps mini squat with UE support  Other Standing Knee Exercises  weaving in and out of dark blocks on the floor, tandem line walk, high marching- PT with CGA on gait belt    Other Standing Knee Exercises  standing on black pad: shoulder flexion x 10 bil      Knee/Hip Exercises: Seated   Long Arc Quad  2 sets;15 reps;Limitations    Long Arc Quad Weight  4 lbs.               PT Short Term Goals - 03/21/18 1240      PT SHORT TERM GOAL #2   Title  improve LE strength and endurance to stand for 15-20 minutes without rest    Baseline  15 minutes for shaving and showering    Status  Achieved      PT SHORT TERM GOAL #3   Title  improve 6 minute walk test to ambulate 750 feet     Status  Achieved        PT Long Term Goals - 03/21/18 1241      PT LONG TERM GOAL #1   Title  be independent in advanced HEP    Time  8     Period  Weeks    Target Date  05/16/18      PT LONG TERM GOAL #2   Title  reduce FOTO to < or = to 41% limitation    Baseline  60% limitation    Status  Deferred not met.  Not having pain      PT LONG TERM GOAL #3   Title  improve LE strength and endurance to walk for 15 minutes in the community without need to rest    Baseline  10 minutes    Time  8    Period  Weeks    Status  On-going    Target Date  05/16/18      PT LONG TERM GOAL #4   Title  improve 6 minute walk test to ambulate > or = to 1000  feet     Status  Achieved      PT LONG TERM GOAL #5   Title  demonstrate 4+/5 bil LE strength to improve safety and endurance on unlevel surfaces    Time  8    Status  On-going    Target Date  05/16/18      Additional Long Term Goals   Additional Long Term Goals  Yes      PT LONG TERM GOAL #6   Title  improve LE strength to squat to a low cabinet with safety and demonstrate ability to rise to standing from this position    Time  8    Period  Weeks    Status  On-going    Target Date  05/16/18            Plan - 03/27/18 1153    Clinical Impression Statement  Pt is making good progress regarding strength, gait and safety.  Pt with slow progression due to chronic nature of condition.  Pt reports that his endurance has improved in the community with ambulation.  Pt requires supervision and intermittent verbal cues for static exercise and requires contact guard assitance for balance activities for safety.      Rehab Potential  Good    PT Frequency  2x / week    PT Duration  8 weeks    PT Treatment/Interventions  ADLs/Self Care Home Management;Cryotherapy;Moist Heat;Balance training;Therapeutic exercise;Functional mobility training;Therapeutic activities;Gait training;Stair  training;Neuromuscular re-education;Patient/family education;Passive range of motion;Manual techniques    PT Next Visit Plan  LE strength, sit to stand, balance, gait with heel to toe progression    Consulted  and Agree with Plan of Care  Patient       Patient will benefit from skilled therapeutic intervention in order to improve the following deficits and impairments:  Abnormal gait, Difficulty walking, Decreased balance, Decreased activity tolerance, Decreased mobility, Decreased coordination, Postural dysfunction  Visit Diagnosis: Muscle weakness (generalized)  Other abnormalities of gait and mobility     Problem List Patient Active Problem List   Diagnosis Date Noted  . Gait disorder 04/20/2016  . Hyperglycemia 04/20/2016  . Abnormal CT scan, chest 06/03/2014  . Erectile dysfunction 06/03/2014  . Spinal stenosis of lumbar region 03/15/2014  . Cough 03/12/2013  . Allergic rhinitis 03/12/2013  . Leg pain, bilateral 12/30/2011  . Bladder neck obstruction 12/30/2011  . Preventative health care 12/25/2011  . Lumbar disc disease 12/25/2011  . S/P removal of thyroid nodule 12/25/2011  . History of MI (myocardial infarction) 12/25/2011  . Right knee meniscal tear 12/25/2011  . Degenerative arthritis of hip 12/25/2011  . HIP PAIN, RIGHT 05/14/2010  . FATIGUE 10/13/2009  . HYPERTENSION, BENIGN 02/25/2009  . CAD, AUTOLOGOUS BYPASS GRAFT 02/25/2009  . CORONARY ARTERY ANEURYSM 02/25/2009  . BACK PAIN 04/15/2008  . Hyperlipidemia 02/13/2008  . CORONARY ARTERY DISEASE 02/13/2008  . DIVERTICULOSIS, COLON 02/13/2008  . CARDIAC DISEASE, HX OF 02/13/2008  . COLONIC POLYPS, HX OF 02/13/2008     Sigurd Sos, PT 03/27/18 12:26 PM  [facility] [address], [place], [place], [address] Phone: [phone]   Fax:  [phone]  Name: Larry [name] MRN: [id number] Date of Birth: 27-Apr-1932

## 2018-03-30 ENCOUNTER — Ambulatory Visit: Payer: Medicare Other

## 2018-03-30 DIAGNOSIS — R2689 Other abnormalities of gait and mobility: Secondary | ICD-10-CM | POA: Diagnosis not present

## 2018-03-30 DIAGNOSIS — M6281 Muscle weakness (generalized): Secondary | ICD-10-CM | POA: Diagnosis not present

## 2018-03-30 NOTE — Therapy (Signed)
[facility] [address], [place], [place], [address] Phone: [phone]   Fax:  [phone]  Physical Therapy Treatment  Patient Details  Name: Larry [name] MRN: [id number] Date of Birth: [birth year] Referring Provider: Jovita Gamma, MD   Encounter Date: 03/30/2018  PT End of Session - 03/30/18 1312    Visit Number  15    Date for PT Re-Evaluation  05/16/18    Authorization Type  Medicare: KX at 73    PT Start Time  1229    PT Stop Time  1312    PT Time Calculation (min)  43 min    Activity Tolerance  Patient tolerated treatment well    Behavior During Therapy  [facility] for tasks assessed/performed       Past Medical History:  Diagnosis Date  . Allergic rhinitis, cause unspecified 03/12/2013  . Allergy   . CAD (coronary artery disease)    S/P CABG in 1999   . Cataract   . Decreased exercise tolerance    Exertional fatigue  . Degenerative arthritis of hip   . Diverticulosis of colon   . ED (erectile dysfunction)   . Erectile dysfunction 06/03/2014  . History of colonic polyps   . History of kidney stones   . History of MI (myocardial infarction)   . Hx of adenomatous colonic polyps 2000  . Hyperlipidemia    Low HDL  . Lumbar disc disease    Hx of with recent back pain and buttock pain  . Myocardial infarction ([place]) 1999  . Right knee meniscal tear   . S/P removal of thyroid nodule   . Spinal stenosis of lumbar region 03/15/2014    Past Surgical History:  Procedure Laterality Date  . CARDIAC CATHETERIZATION    . CATARACT EXTRACTION    . COLONOSCOPY    . CORONARY ARTERY BYPASS GRAFT  1999  . CYSTOSCOPY/URETEROSCOPY/HOLMIUM LASER/STENT PLACEMENT Left 12/23/2017   Procedure: CYSTOSCOPY/RETROGRADE/URETEROSCOPY/HOLMIUM LASER/STENT PLACEMENT;  Surgeon: Festus Aloe, MD;  [facility];  Service: Urology;  Laterality: Left;  ONLY NEEDS 60 MIN  . LUMBAR DISC SURGERY    . Nodules on Thyroid    .  POLYPECTOMY    . PTCA    . SHOULDER ARTHROSCOPY WITH ROTATOR CUFF REPAIR AND SUBACROMIAL DECOMPRESSION Right 01/30/2015   Procedure: RIGHT ARTHROSCOPY SHOULDER SUBACROMIAL DECOMPRESSION DISTAL CLAVICAL RESECTION RETATOR CUFF REPAIR;  Surgeon: Justice Britain, MD;  Location: [facility];  Service: Orthopedics;  Laterality: Right;  . TONSILLECTOMY    . VASECTOMY      There were no vitals filed for this visit.                    [facility] Adult PT Treatment/Exercise - 03/30/18 0001      Lumbar Exercises: Aerobic   Nustep  Level 2x 10 minutes PT present to discuss progress      Knee/Hip Exercises: Standing   Heel Raises  Both;2 sets;10 reps    Hip Abduction  Stengthening;Both;2 sets;10 reps    Abduction Limitations  4# added    Hip Extension  Stengthening;Both;2 sets;10 reps    Extension Limitations  4# added    Functional Squat  2 sets;10 reps mini squat with UE support    Other Standing Knee Exercises  weaving in and out of dark blocks on the floor, tandem line walk, high marching- PT with CGA on gait belt    Other Standing Knee Exercises  standing on black pad: shoulder flexion x  10 bil      Knee/Hip Exercises: Seated   Long Arc Quad  2 sets;15 reps;Limitations    Long Arc Quad Weight  4 lbs.               PT Short Term Goals - 03/21/18 1240      PT SHORT TERM GOAL #2   Title  improve LE strength and endurance to stand for 15-20 minutes without rest    Baseline  15 minutes for shaving and showering    Status  Achieved      PT SHORT TERM GOAL #3   Title  improve 6 minute walk test to ambulate 750 feet     Status  Achieved        PT Long Term Goals - 03/21/18 1241      PT LONG TERM GOAL #1   Title  be independent in advanced HEP    Time  8    Period  Weeks    Target Date  05/16/18      PT LONG TERM GOAL #2   Title  reduce FOTO to < or = to 41% limitation    Baseline  60% limitation    Status  Deferred not met.  Not having pain      PT LONG TERM GOAL  #3   Title  improve LE strength and endurance to walk for 15 minutes in the community without need to rest    Baseline  10 minutes    Time  8    Period  Weeks    Status  On-going    Target Date  05/16/18      PT LONG TERM GOAL #4   Title  improve 6 minute walk test to ambulate > or = to 1000  feet     Status  Achieved      PT LONG TERM GOAL #5   Title  demonstrate 4+/5 bil LE strength to improve safety and endurance on unlevel surfaces    Time  8    Status  On-going    Target Date  05/16/18      Additional Long Term Goals   Additional Long Term Goals  Yes      PT LONG TERM GOAL #6   Title  improve LE strength to squat to a low cabinet with safety and demonstrate ability to rise to standing from this position    Time  8    Period  Weeks    Status  On-going    Target Date  05/16/18            Plan - 03/30/18 1235    Clinical Impression Statement  Pt is making steady progress.  Pt had increased fatigue today and demonstrated instability with standing balance exercises.  Pt reports that his endurance has improved in the community with ambulation.  Pt requires supervision and intermittent verbal cues for static exercise and requires contact guard assist for balance activities.      Rehab Potential  Good    PT Frequency  2x / week    PT Duration  8 weeks    PT Treatment/Interventions  ADLs/Self Care Home Management;Cryotherapy;Moist Heat;Balance training;Therapeutic exercise;Functional mobility training;Therapeutic activities;Gait training;Stair training;Neuromuscular re-education;Patient/family education;Passive range of motion;Manual techniques    PT Next Visit Plan  LE strength, sit to stand, balance, gait with heel to toe progression    Consulted and Agree with Plan of Care  Patient       Patient  will benefit from skilled therapeutic intervention in order to improve the following deficits and impairments:  Abnormal gait, Difficulty walking, Decreased balance, Decreased  activity tolerance, Decreased mobility, Decreased coordination, Postural dysfunction  Visit Diagnosis: Muscle weakness (generalized)  Other abnormalities of gait and mobility     Problem List Patient Active Problem List   Diagnosis Date Noted  . Gait disorder 04/20/2016  . Hyperglycemia 04/20/2016  . Abnormal CT scan, chest 06/03/2014  . Erectile dysfunction 06/03/2014  . Spinal stenosis of lumbar region 03/15/2014  . Cough 03/12/2013  . Allergic rhinitis 03/12/2013  . Leg pain, bilateral 12/30/2011  . Bladder neck obstruction 12/30/2011  . Preventative health care 12/25/2011  . Lumbar disc disease 12/25/2011  . S/P removal of thyroid nodule 12/25/2011  . History of MI (myocardial infarction) 12/25/2011  . Right knee meniscal tear 12/25/2011  . Degenerative arthritis of hip 12/25/2011  . HIP PAIN, RIGHT 05/14/2010  . FATIGUE 10/13/2009  . HYPERTENSION, BENIGN 02/25/2009  . CAD, AUTOLOGOUS BYPASS GRAFT 02/25/2009  . CORONARY ARTERY ANEURYSM 02/25/2009  . BACK PAIN 04/15/2008  . Hyperlipidemia 02/13/2008  . CORONARY ARTERY DISEASE 02/13/2008  . DIVERTICULOSIS, COLON 02/13/2008  . CARDIAC DISEASE, HX OF 02/13/2008  . COLONIC POLYPS, HX OF 02/13/2008    Keayra Graham 03/30/2018, 1:15 PM  [facility] [address] [place], [place], [address] Phone: [phone]   Fax:  [phone]  Name: Derrion Tritz MRN: [id number] Date of Birth: November 23, 1932

## 2018-04-04 ENCOUNTER — Ambulatory Visit: Payer: Medicare Other

## 2018-04-04 DIAGNOSIS — R2689 Other abnormalities of gait and mobility: Secondary | ICD-10-CM | POA: Diagnosis not present

## 2018-04-04 DIAGNOSIS — M6281 Muscle weakness (generalized): Secondary | ICD-10-CM | POA: Diagnosis not present

## 2018-04-04 NOTE — Therapy (Signed)
[facility] [address] [place], [place], [address] Phone: [phone]   Fax:  [phone]  Physical Therapy Treatment  Patient Details  Name: Larry [name] MRN: [id number] Date of Birth: 1932-05-04 Referring Provider: Jovita Gamma, MD   Encounter Date: 04/04/2018  PT End of Session - 04/04/18 1140    Visit Number  16    Date for PT Re-Evaluation  05/16/18    Authorization Type  Medicare: KX at 48    PT Start Time  1057    PT Stop Time  1139    PT Time Calculation (min)  42 min    Activity Tolerance  Patient tolerated treatment well    Behavior During Therapy  [facility] [place] [facility] for tasks assessed/performed       Past Medical History:  Diagnosis Date  . Allergic rhinitis, cause unspecified 03/12/2013  . Allergy   . CAD (coronary artery disease)    S/P CABG in 1999   . Cataract   . Decreased exercise tolerance    Exertional fatigue  . Degenerative arthritis of hip   . Diverticulosis of colon   . ED (erectile dysfunction)   . Erectile dysfunction 06/03/2014  . History of colonic polyps   . History of kidney stones   . History of MI (myocardial infarction)   . Hx of adenomatous colonic polyps 2000  . Hyperlipidemia    Low HDL  . Lumbar disc disease    Hx of with recent back pain and buttock pain  . Myocardial infarction ([place]) 1999  . Right knee meniscal tear   . S/P removal of thyroid nodule   . Spinal stenosis of lumbar region 03/15/2014    Past Surgical History:  Procedure Laterality Date  . CARDIAC CATHETERIZATION    . CATARACT EXTRACTION    . COLONOSCOPY    . CORONARY ARTERY BYPASS GRAFT  1999  . CYSTOSCOPY/URETEROSCOPY/HOLMIUM LASER/STENT PLACEMENT Left 12/23/2017   Procedure: CYSTOSCOPY/RETROGRADE/URETEROSCOPY/HOLMIUM LASER/STENT PLACEMENT;  Surgeon: Festus Aloe, MD;  [facility];  Service: Urology;  Laterality: Left;  ONLY NEEDS 60 MIN  . LUMBAR DISC SURGERY    . Nodules on Thyroid    .  POLYPECTOMY    . PTCA    . SHOULDER ARTHROSCOPY WITH ROTATOR CUFF REPAIR AND SUBACROMIAL DECOMPRESSION Right 01/30/2015   Procedure: RIGHT ARTHROSCOPY SHOULDER SUBACROMIAL DECOMPRESSION DISTAL CLAVICAL RESECTION RETATOR CUFF REPAIR;  Surgeon: Justice Britain, MD;  Location: [facility];  Service: Orthopedics;  Laterality: Right;  . TONSILLECTOMY    . VASECTOMY      There were no vitals filed for this visit.  Subjective Assessment - 04/04/18 1113    Subjective  I feel more steady when I walk from room to room.      Patient Stated Goals  improve endurance for standing and walking    Currently in Pain?  No/denies                       [facility] Adult PT Treatment/Exercise - 04/04/18 0001      Lumbar Exercises: Aerobic   Nustep  Level 2x 10 minutes PT present to discuss progress      Knee/Hip Exercises: Standing   Heel Raises  Both;2 sets;10 reps    Hip Abduction  Stengthening;Both;2 sets;10 reps    Abduction Limitations  4# added    Hip Extension  Stengthening;Both;2 sets;10 reps    Extension Limitations  4# added    Walking with Sports Cord  15# forward  x 10- close CGA for safety    Gait Training  tandem stance 4x20 seconds with min UE support    Other Standing Knee Exercises  weaving in and out of dark blocks on the floor, tandem line walk, high marching- PT with CGA on gait belt    Other Standing Knee Exercises  standing on black pad: shoulder flexion x 10 bil      Knee/Hip Exercises: Seated   Long Arc Quad  2 sets;15 reps;Limitations    Long Arc Quad Weight  4 lbs.               PT Short Term Goals - 03/21/18 1240      PT SHORT TERM GOAL #2   Title  improve LE strength and endurance to stand for 15-20 minutes without rest    Baseline  15 minutes for shaving and showering    Status  Achieved      PT SHORT TERM GOAL #3   Title  improve 6 minute walk test to ambulate 750 feet     Status  Achieved        PT Long Term Goals - 03/21/18 1241      PT LONG TERM  GOAL #1   Title  be independent in advanced HEP    Time  8    Period  Weeks    Target Date  05/16/18      PT LONG TERM GOAL #2   Title  reduce FOTO to < or = to 41% limitation    Baseline  60% limitation    Status  Deferred not met.  Not having pain      PT LONG TERM GOAL #3   Title  improve LE strength and endurance to walk for 15 minutes in the community without need to rest    Baseline  10 minutes    Time  8    Period  Weeks    Status  On-going    Target Date  05/16/18      PT LONG TERM GOAL #4   Title  improve 6 minute walk test to ambulate > or = to 1000  feet     Status  Achieved      PT LONG TERM GOAL #5   Title  demonstrate 4+/5 bil LE strength to improve safety and endurance on unlevel surfaces    Time  8    Status  On-going    Target Date  05/16/18      Additional Long Term Goals   Additional Long Term Goals  Yes      PT LONG TERM GOAL #6   Title  improve LE strength to squat to a low cabinet with safety and demonstrate ability to rise to standing from this position    Time  8    Period  Weeks    Status  On-going    Target Date  05/16/18            Plan - 04/04/18 1117    Clinical Impression Statement  Pt reports that he is more stable when he walks between rooms at home without cane.  Pt reports fewer days of feeling fatigued overall.  Pt was able to perform resisted walking with 15# with CGA for safety.  Pt requires supervision and intermittent verbal for static exercise and requires contact guard assistnace for balance exercises.      PT Frequency  2x / week    PT Duration  8  weeks    PT Treatment/Interventions  ADLs/Self Care Home Management;Cryotherapy;Moist Heat;Balance training;Therapeutic exercise;Functional mobility training;Therapeutic activities;Gait training;Stair training;Neuromuscular re-education;Patient/family education;Passive range of motion;Manual techniques    PT Next Visit Plan  LE strength, sit to stand, balance, gait with heel to toe  progression    Consulted and Agree with Plan of Care  Patient       Patient will benefit from skilled therapeutic intervention in order to improve the following deficits and impairments:  Abnormal gait, Difficulty walking, Decreased balance, Decreased activity tolerance, Decreased mobility, Decreased coordination, Postural dysfunction  Visit Diagnosis: Muscle weakness (generalized)  Other abnormalities of gait and mobility     Problem List Patient Active Problem List   Diagnosis Date Noted  . Gait disorder 04/20/2016  . Hyperglycemia 04/20/2016  . Abnormal CT scan, chest 06/03/2014  . Erectile dysfunction 06/03/2014  . Spinal stenosis of lumbar region 03/15/2014  . Cough 03/12/2013  . Allergic rhinitis 03/12/2013  . Leg pain, bilateral 12/30/2011  . Bladder neck obstruction 12/30/2011  . Preventative health care 12/25/2011  . Lumbar disc disease 12/25/2011  . S/P removal of thyroid nodule 12/25/2011  . History of MI (myocardial infarction) 12/25/2011  . Right knee meniscal tear 12/25/2011  . Degenerative arthritis of hip 12/25/2011  . HIP PAIN, RIGHT 05/14/2010  . FATIGUE 10/13/2009  . HYPERTENSION, BENIGN 02/25/2009  . CAD, AUTOLOGOUS BYPASS GRAFT 02/25/2009  . CORONARY ARTERY ANEURYSM 02/25/2009  . BACK PAIN 04/15/2008  . Hyperlipidemia 02/13/2008  . CORONARY ARTERY DISEASE 02/13/2008  . DIVERTICULOSIS, COLON 02/13/2008  . CARDIAC DISEASE, HX OF 02/13/2008  . COLONIC POLYPS, HX OF 02/13/2008    Sigurd Sos, PT 04/04/18 11:42 AM  [facility] [address], [place], [place], [address] Phone: [phone]   Fax:  [phone]  Name: Luisangel Wainright MRN: [id number] Date of Birth: 04/03/1932

## 2018-04-06 ENCOUNTER — Ambulatory Visit: Payer: Medicare Other

## 2018-04-06 DIAGNOSIS — R2689 Other abnormalities of gait and mobility: Secondary | ICD-10-CM

## 2018-04-06 DIAGNOSIS — M6281 Muscle weakness (generalized): Secondary | ICD-10-CM

## 2018-04-06 NOTE — Therapy (Signed)
[facility] [address] [place], [place], [address] Phone: [phone]   Fax:  [phone]  Physical Therapy Treatment  Patient Details  Name: Larry [name] MRN: [id number] Date of Birth: 1932-08-03 Referring Provider: Jovita Gamma, MD   Encounter Date: 04/06/2018  PT End of Session - 04/06/18 1305    Visit Number  17    Date for PT Re-Evaluation  05/16/18    Authorization Type  Medicare: KX at 43    PT Start Time  1225    PT Stop Time  1305    PT Time Calculation (min)  40 min    Activity Tolerance  Patient tolerated treatment well    Behavior During Therapy  [facility] for tasks assessed/performed       Past Medical History:  Diagnosis Date  . Allergic rhinitis, cause unspecified 03/12/2013  . Allergy   . CAD (coronary artery disease)    S/P CABG in 1999   . Cataract   . Decreased exercise tolerance    Exertional fatigue  . Degenerative arthritis of hip   . Diverticulosis of colon   . ED (erectile dysfunction)   . Erectile dysfunction 06/03/2014  . History of colonic polyps   . History of kidney stones   . History of MI (myocardial infarction)   . Hx of adenomatous colonic polyps 2000  . Hyperlipidemia    Low HDL  . Lumbar disc disease    Hx of with recent back pain and buttock pain  . Myocardial infarction ([place]) 1999  . Right knee meniscal tear   . S/P removal of thyroid nodule   . Spinal stenosis of lumbar region 03/15/2014    Past Surgical History:  Procedure Laterality Date  . CARDIAC CATHETERIZATION    . CATARACT EXTRACTION    . COLONOSCOPY    . CORONARY ARTERY BYPASS GRAFT  1999  . CYSTOSCOPY/URETEROSCOPY/HOLMIUM LASER/STENT PLACEMENT Left 12/23/2017   Procedure: CYSTOSCOPY/RETROGRADE/URETEROSCOPY/HOLMIUM LASER/STENT PLACEMENT;  Surgeon: Festus Aloe, MD;  Location: [facility];  Service: Urology;  Laterality: Left;  ONLY NEEDS 60 MIN  . LUMBAR DISC SURGERY    . Nodules on Thyroid    .  POLYPECTOMY    . PTCA    . SHOULDER ARTHROSCOPY WITH ROTATOR CUFF REPAIR AND SUBACROMIAL DECOMPRESSION Right 01/30/2015   Procedure: RIGHT ARTHROSCOPY SHOULDER SUBACROMIAL DECOMPRESSION DISTAL CLAVICAL RESECTION RETATOR CUFF REPAIR;  Surgeon: Justice Britain, MD;  Location: [place];  Service: Orthopedics;  Laterality: Right;  . TONSILLECTOMY    . VASECTOMY      There were no vitals filed for this visit.  Subjective Assessment - 04/06/18 1232    Subjective  Doing well.  I feel more steady overall.      Patient Stated Goals  improve endurance for standing and walking    Currently in Pain?  No/denies                       [facility] Adult PT Treatment/Exercise - 04/06/18 0001      Lumbar Exercises: Aerobic   Nustep  Level 2x 10 minutes PT present to discuss progress      Knee/Hip Exercises: Standing   Hip Abduction  Stengthening;Both;2 sets;10 reps    Abduction Limitations  4# added    Hip Extension  Stengthening;Both;2 sets;10 reps    Extension Limitations  4# added    Functional Squat  2 sets;10 reps mini squat with UE support    Gait Training  tandem stance  4x20 seconds with min UE support    Other Standing Knee Exercises  weaving in and out of dark blocks on the floor, tandem line walk, high marching- PT with CGA on gait belt    Other Standing Knee Exercises  standing on black pad: shoulder flexion x 10 bil      Knee/Hip Exercises: Seated   Long Arc Quad  2 sets;15 reps;Limitations    Long Arc Quad Weight  4 lbs.               PT Short Term Goals - 03/21/18 1240      PT SHORT TERM GOAL #2   Title  improve LE strength and endurance to stand for 15-20 minutes without rest    Baseline  15 minutes for shaving and showering    Status  Achieved      PT SHORT TERM GOAL #3   Title  improve 6 minute walk test to ambulate 750 feet     Status  Achieved        PT Long Term Goals - 03/21/18 1241      PT LONG TERM GOAL #1   Title  be independent in advanced HEP     Time  8    Period  Weeks    Target Date  05/16/18      PT LONG TERM GOAL #2   Title  reduce FOTO to < or = to 41% limitation    Baseline  60% limitation    Status  Deferred not met.  Not having pain      PT LONG TERM GOAL #3   Title  improve LE strength and endurance to walk for 15 minutes in the community without need to rest    Baseline  10 minutes    Time  8    Period  Weeks    Status  On-going    Target Date  05/16/18      PT LONG TERM GOAL #4   Title  improve 6 minute walk test to ambulate > or = to 1000  feet     Status  Achieved      PT LONG TERM GOAL #5   Title  demonstrate 4+/5 bil LE strength to improve safety and endurance on unlevel surfaces    Time  8    Status  On-going    Target Date  05/16/18      Additional Long Term Goals   Additional Long Term Goals  Yes      PT LONG TERM GOAL #6   Title  improve LE strength to squat to a low cabinet with safety and demonstrate ability to rise to standing from this position    Time  8    Period  Weeks    Status  On-going    Target Date  05/16/18            Plan - 04/06/18 1233    Clinical Impression Statement  Pt reports that he is more stable when he walks between rooms at home without a cane.  Pt feels less fatigue overall.  Pt with chronic fatigue and balance deficits and is making steady progress in appropriate time for his condition.  Pt requires supervision and CGA for safety with all activities in the clinic today.      Rehab Potential  Good    PT Frequency  2x / week    PT Duration  8 weeks    PT Treatment/Interventions  ADLs/Self Care Home Management;Cryotherapy;Moist Heat;Balance training;Therapeutic exercise;Functional mobility training;Therapeutic activities;Gait training;Stair training;Neuromuscular re-education;Patient/family education;Passive range of motion;Manual techniques    PT Home Exercise Plan  added sit to stand, standing heel raises/toe raises, tandem (EO/EC), ankle 4 way (green band)     Consulted and Agree with Plan of Care  Patient       Patient will benefit from skilled therapeutic intervention in order to improve the following deficits and impairments:  Abnormal gait, Difficulty walking, Decreased balance, Decreased activity tolerance, Decreased mobility, Decreased coordination, Postural dysfunction  Visit Diagnosis: Muscle weakness (generalized)  Other abnormalities of gait and mobility     Problem List Patient Active Problem List   Diagnosis Date Noted  . Gait disorder 04/20/2016  . Hyperglycemia 04/20/2016  . Abnormal CT scan, chest 06/03/2014  . Erectile dysfunction 06/03/2014  . Spinal stenosis of lumbar region 03/15/2014  . Cough 03/12/2013  . Allergic rhinitis 03/12/2013  . Leg pain, bilateral 12/30/2011  . Bladder neck obstruction 12/30/2011  . Preventative health care 12/25/2011  . Lumbar disc disease 12/25/2011  . S/P removal of thyroid nodule 12/25/2011  . History of MI (myocardial infarction) 12/25/2011  . Right knee meniscal tear 12/25/2011  . Degenerative arthritis of hip 12/25/2011  . HIP PAIN, RIGHT 05/14/2010  . FATIGUE 10/13/2009  . HYPERTENSION, BENIGN 02/25/2009  . CAD, AUTOLOGOUS BYPASS GRAFT 02/25/2009  . CORONARY ARTERY ANEURYSM 02/25/2009  . BACK PAIN 04/15/2008  . Hyperlipidemia 02/13/2008  . CORONARY ARTERY DISEASE 02/13/2008  . DIVERTICULOSIS, COLON 02/13/2008  . CARDIAC DISEASE, HX OF 02/13/2008  . COLONIC POLYPS, HX OF 02/13/2008     Sigurd Sos, PT 04/06/18 1:07 PM  [facility] [address] [place], [place], [address] Phone: [phone]   Fax:  [phone]  Name: Tyrease Vandeberg MRN: [id number] Date of Birth: May 12, 1932

## 2018-04-10 ENCOUNTER — Ambulatory Visit: Payer: Medicare Other

## 2018-04-10 DIAGNOSIS — R2689 Other abnormalities of gait and mobility: Secondary | ICD-10-CM

## 2018-04-10 DIAGNOSIS — M6281 Muscle weakness (generalized): Secondary | ICD-10-CM

## 2018-04-10 NOTE — Therapy (Signed)
[facility] [address], [place], [place], [address] Phone: [phone]   Fax:  [phone]  Physical Therapy Treatment  Patient Details  Name: Larry [name] MRN: [id number] Date of Birth: Sep 24, 1932 Referring Provider: Jovita Gamma, MD   Encounter Date: 04/10/2018  PT End of Session - 04/10/18 1310    Visit Number  18    Date for PT Re-Evaluation  05/16/18    Authorization Type  Medicare: KX at 52    PT Start Time  1228    PT Stop Time  1310    PT Time Calculation (min)  42 min    Activity Tolerance  Patient tolerated treatment well    Behavior During Therapy  [facility] for tasks assessed/performed       Past Medical History:  Diagnosis Date  . Allergic rhinitis, cause unspecified 03/12/2013  . Allergy   . CAD (coronary artery disease)    S/P CABG in 1999   . Cataract   . Decreased exercise tolerance    Exertional fatigue  . Degenerative arthritis of hip   . Diverticulosis of colon   . ED (erectile dysfunction)   . Erectile dysfunction 06/03/2014  . History of colonic polyps   . History of kidney stones   . History of MI (myocardial infarction)   . Hx of adenomatous colonic polyps 2000  . Hyperlipidemia    Low HDL  . Lumbar disc disease    Hx of with recent back pain and buttock pain  . Myocardial infarction ([place]) 1999  . Right knee meniscal tear   . S/P removal of thyroid nodule   . Spinal stenosis of lumbar region 03/15/2014    Past Surgical History:  Procedure Laterality Date  . CARDIAC CATHETERIZATION    . CATARACT EXTRACTION    . COLONOSCOPY    . CORONARY ARTERY BYPASS GRAFT  1999  . CYSTOSCOPY/URETEROSCOPY/HOLMIUM LASER/STENT PLACEMENT Left 12/23/2017   Procedure: CYSTOSCOPY/RETROGRADE/URETEROSCOPY/HOLMIUM LASER/STENT PLACEMENT;  Surgeon: Festus Aloe, MD;  [facility];  Service: Urology;  Laterality: Left;  ONLY NEEDS 60 MIN  . LUMBAR DISC SURGERY    . Nodules on Thyroid    .  POLYPECTOMY    . PTCA    . SHOULDER ARTHROSCOPY WITH ROTATOR CUFF REPAIR AND SUBACROMIAL DECOMPRESSION Right 01/30/2015   Procedure: RIGHT ARTHROSCOPY SHOULDER SUBACROMIAL DECOMPRESSION DISTAL CLAVICAL RESECTION RETATOR CUFF REPAIR;  Surgeon: Justice Britain, MD;  Location: [place];  Service: Orthopedics;  Laterality: Right;  . TONSILLECTOMY    . VASECTOMY      There were no vitals filed for this visit.  Subjective Assessment - 04/10/18 1238    Subjective  I had a good weekend.      Patient Stated Goals  improve endurance for standing and walking    Currently in Pain?  No/denies                       [facility] Adult PT Treatment/Exercise - 04/10/18 0001      Lumbar Exercises: Aerobic   Nustep  Level 2x 10 minutes PT present to discuss progress      Knee/Hip Exercises: Standing   Heel Raises  Both;2 sets;10 reps on mini tramp    Hip Abduction  Stengthening;Both;2 sets;10 reps    Abduction Limitations  4# added    Hip Extension  Stengthening;Both;2 sets;10 reps    Extension Limitations  4# added    Functional Squat  2 sets;10 reps mini squat with UE  support    Rebounder  weight shifting 3 ways x 1 minute each    Gait Training  tandem stance 4x20 seconds with min UE support    Other Standing Knee Exercises  weaving in and out of dark blocks on the floor, tandem line walk, high marching- PT with CGA on gait belt    Other Standing Knee Exercises  standing on black pad: shoulder flexion x 10 bil      Knee/Hip Exercises: Seated   Long Arc Quad  2 sets;15 reps;Limitations    Long Arc Quad Weight  4 lbs.               PT Short Term Goals - 03/21/18 1240      PT SHORT TERM GOAL #2   Title  improve LE strength and endurance to stand for 15-20 minutes without rest    Baseline  15 minutes for shaving and showering    Status  Achieved      PT SHORT TERM GOAL #3   Title  improve 6 minute walk test to ambulate 750 feet     Status  Achieved        PT Long Term Goals  - 03/21/18 1241      PT LONG TERM GOAL #1   Title  be independent in advanced HEP    Time  8    Period  Weeks    Target Date  05/16/18      PT LONG TERM GOAL #2   Title  reduce FOTO to < or = to 41% limitation    Baseline  60% limitation    Status  Deferred not met.  Not having pain      PT LONG TERM GOAL #3   Title  improve LE strength and endurance to walk for 15 minutes in the community without need to rest    Baseline  10 minutes    Time  8    Period  Weeks    Status  On-going    Target Date  05/16/18      PT LONG TERM GOAL #4   Title  improve 6 minute walk test to ambulate > or = to 1000  feet     Status  Achieved      PT LONG TERM GOAL #5   Title  demonstrate 4+/5 bil LE strength to improve safety and endurance on unlevel surfaces    Time  8    Status  On-going    Target Date  05/16/18      Additional Long Term Goals   Additional Long Term Goals  Yes      PT LONG TERM GOAL #6   Title  improve LE strength to squat to a low cabinet with safety and demonstrate ability to rise to standing from this position    Time  8    Period  Weeks    Status  On-going    Target Date  05/16/18            Plan - 04/10/18 1253    Clinical Impression Statement  Pt is more stable with short distance ambulation and no longer requires use of cane for stability.  Pt with improved stabilty with standing activities with fewer verbal cues and reduced guarding required by PT for safety.  PT requires close supervision for safety with all activity in the clinic.  Pt will conitnue to benefit from skilled PT for LE strength, endurance, gait and balance tasks.  Rehab Potential  Good    PT Frequency  2x / week    PT Duration  8 weeks    PT Treatment/Interventions  ADLs/Self Care Home Management;Cryotherapy;Moist Heat;Balance training;Therapeutic exercise;Functional mobility training;Therapeutic activities;Gait training;Stair training;Neuromuscular re-education;Patient/family  education;Passive range of motion;Manual techniques    PT Next Visit Plan  LE strength, sit to stand, balance, gait with heel to toe progression    Recommended Other Services  initial cert is signed, recert sent 05/21/25 and 2nd attemp 04/10/18    Consulted and Agree with Plan of Care  Patient       Patient will benefit from skilled therapeutic intervention in order to improve the following deficits and impairments:  Abnormal gait, Difficulty walking, Decreased balance, Decreased activity tolerance, Decreased mobility, Decreased coordination, Postural dysfunction  Visit Diagnosis: Muscle weakness (generalized)  Other abnormalities of gait and mobility     Problem List Patient Active Problem List   Diagnosis Date Noted  . Gait disorder 04/20/2016  . Hyperglycemia 04/20/2016  . Abnormal CT scan, chest 06/03/2014  . Erectile dysfunction 06/03/2014  . Spinal stenosis of lumbar region 03/15/2014  . Cough 03/12/2013  . Allergic rhinitis 03/12/2013  . Leg pain, bilateral 12/30/2011  . Bladder neck obstruction 12/30/2011  . Preventative health care 12/25/2011  . Lumbar disc disease 12/25/2011  . S/P removal of thyroid nodule 12/25/2011  . History of MI (myocardial infarction) 12/25/2011  . Right knee meniscal tear 12/25/2011  . Degenerative arthritis of hip 12/25/2011  . HIP PAIN, RIGHT 05/14/2010  . FATIGUE 10/13/2009  . HYPERTENSION, BENIGN 02/25/2009  . CAD, AUTOLOGOUS BYPASS GRAFT 02/25/2009  . CORONARY ARTERY ANEURYSM 02/25/2009  . BACK PAIN 04/15/2008  . Hyperlipidemia 02/13/2008  . CORONARY ARTERY DISEASE 02/13/2008  . DIVERTICULOSIS, COLON 02/13/2008  . CARDIAC DISEASE, HX OF 02/13/2008  . COLONIC POLYPS, HX OF 02/13/2008    Larry [name], PT 04/10/18 1:13 PM  [facility] [address] [place], [place], [address] Phone: [phone]   Fax:  [phone]  Name: Larry [name] MRN: [id number] Date of Birth:  10/27/1932

## 2018-04-13 ENCOUNTER — Ambulatory Visit: Payer: Medicare Other

## 2018-04-13 DIAGNOSIS — R2689 Other abnormalities of gait and mobility: Secondary | ICD-10-CM

## 2018-04-13 DIAGNOSIS — M6281 Muscle weakness (generalized): Secondary | ICD-10-CM

## 2018-04-13 NOTE — Therapy (Signed)
[facility] [address], [place], [place], [address] Phone: [phone]   Fax:  [phone]  Physical Therapy Treatment  Patient Details  Name: Larry [name] MRN: [id number] Date of Birth: [birth year] Referring Provider: Jovita Gamma, MD   Encounter Date: 04/13/2018  PT End of Session - 04/13/18 1140    Visit Number  19    Date for PT Re-Evaluation  05/16/18    Authorization Type  Medicare: KX at 1    PT Start Time  1101    PT Stop Time  1141    PT Time Calculation (min)  40 min    Activity Tolerance  Patient tolerated treatment well    Behavior During Therapy  [facility] for tasks assessed/performed       Past Medical History:  Diagnosis Date  . Allergic rhinitis, cause unspecified 03/12/2013  . Allergy   . CAD (coronary artery disease)    S/P CABG in 1999   . Cataract   . Decreased exercise tolerance    Exertional fatigue  . Degenerative arthritis of hip   . Diverticulosis of colon   . ED (erectile dysfunction)   . Erectile dysfunction 06/03/2014  . History of colonic polyps   . History of kidney stones   . History of MI (myocardial infarction)   . Hx of adenomatous colonic polyps 2000  . Hyperlipidemia    Low HDL  . Lumbar disc disease    Hx of with recent back pain and buttock pain  . Myocardial infarction ([place]) 1999  . Right knee meniscal tear   . S/P removal of thyroid nodule   . Spinal stenosis of lumbar region 03/15/2014    Past Surgical History:  Procedure Laterality Date  . CARDIAC CATHETERIZATION    . CATARACT EXTRACTION    . COLONOSCOPY    . CORONARY ARTERY BYPASS GRAFT  1999  . CYSTOSCOPY/URETEROSCOPY/HOLMIUM LASER/STENT PLACEMENT Left 12/23/2017   Procedure: CYSTOSCOPY/RETROGRADE/URETEROSCOPY/HOLMIUM LASER/STENT PLACEMENT;  Surgeon: Festus Aloe, MD;  [facility];  Service: Urology;  Laterality: Left;  ONLY NEEDS 60 MIN  . LUMBAR DISC SURGERY    . Nodules on Thyroid    .  POLYPECTOMY    . PTCA    . SHOULDER ARTHROSCOPY WITH ROTATOR CUFF REPAIR AND SUBACROMIAL DECOMPRESSION Right 01/30/2015   Procedure: RIGHT ARTHROSCOPY SHOULDER SUBACROMIAL DECOMPRESSION DISTAL CLAVICAL RESECTION RETATOR CUFF REPAIR;  Surgeon: Justice Britain, MD;  Location: [employer];  Service: Orthopedics;  Laterality: Right;  . TONSILLECTOMY    . VASECTOMY      There were no vitals filed for this visit.  Subjective Assessment - 04/13/18 1110    Subjective  I am feeling good today.      Currently in Pain?  No/denies                       [facility] Adult PT Treatment/Exercise - 04/13/18 0001      Lumbar Exercises: Aerobic   Nustep  Level 2x 10 minutes PT present to discuss progress      Knee/Hip Exercises: Standing   Heel Raises  Both;2 sets;10 reps on mini tramp    Hip Abduction  Stengthening;Both;2 sets;10 reps    Abduction Limitations  4# added    Hip Extension  Stengthening;Both;2 sets;10 reps    Extension Limitations  4# added    Functional Squat  2 sets;10 reps mini squat with UE support    Rebounder  weight shifting 3 ways x 1 minute  each    Gait Training  tandem stance 4x20 seconds with min UE support    Other Standing Knee Exercises  weaving in and out of dark blocks on the floor, tandem line walk, high marching, head turns- PT with CGA on gait belt    Other Standing Knee Exercises  standing on black pad: shoulder flexion x 10 bil      Knee/Hip Exercises: Seated   Long Arc Quad  2 sets;15 reps;Limitations    Long Arc Quad Weight  4 lbs.               PT Short Term Goals - 03/21/18 1240      PT SHORT TERM GOAL #2   Title  improve LE strength and endurance to stand for 15-20 minutes without rest    Baseline  15 minutes for shaving and showering    Status  Achieved      PT SHORT TERM GOAL #3   Title  improve 6 minute walk test to ambulate 750 feet     Status  Achieved        PT Long Term Goals - 03/21/18 1241      PT LONG TERM GOAL #1   Title   be independent in advanced HEP    Time  8    Period  Weeks    Target Date  05/16/18      PT LONG TERM GOAL #2   Title  reduce FOTO to < or = to 41% limitation    Baseline  60% limitation    Status  Deferred not met.  Not having pain      PT LONG TERM GOAL #3   Title  improve LE strength and endurance to walk for 15 minutes in the community without need to rest    Baseline  10 minutes    Time  8    Period  Weeks    Status  On-going    Target Date  05/16/18      PT LONG TERM GOAL #4   Title  improve 6 minute walk test to ambulate > or = to 1000  feet     Status  Achieved      PT LONG TERM GOAL #5   Title  demonstrate 4+/5 bil LE strength to improve safety and endurance on unlevel surfaces    Time  8    Status  On-going    Target Date  05/16/18      Additional Long Term Goals   Additional Long Term Goals  Yes      PT LONG TERM GOAL #6   Title  improve LE strength to squat to a low cabinet with safety and demonstrate ability to rise to standing from this position    Time  8    Period  Weeks    Status  On-going    Target Date  05/16/18            Plan - 04/13/18 1110    Clinical Impression Statement  Pt is able to walk household distances without use of cane and demonstrates stability for this distance.  Pt with improved stability with standing activities in the clinic with fewer verbal cues and reduced guarding required by PT for safety.  Pt  requires close supervision for all clinic exercise.  Pt will continue to benefit from skilled PT for LE strength, endurance, gait and balance tasks      Rehab Potential  Good  PT Frequency  2x / week    PT Duration  8 weeks    PT Treatment/Interventions  ADLs/Self Care Home Management;Cryotherapy;Moist Heat;Balance training;Therapeutic exercise;Functional mobility training;Therapeutic activities;Gait training;Stair training;Neuromuscular re-education;Patient/family education;Passive range of motion;Manual techniques    PT Next  Visit Plan  LE strength, sit to stand, balance, gait with heel to toe progression    Consulted and Agree with Plan of Care  Patient       Patient will benefit from skilled therapeutic intervention in order to improve the following deficits and impairments:  Abnormal gait, Difficulty walking, Decreased balance, Decreased activity tolerance, Decreased mobility, Decreased coordination, Postural dysfunction  Visit Diagnosis: Muscle weakness (generalized)  Other abnormalities of gait and mobility     Problem List Patient Active Problem List   Diagnosis Date Noted  . Gait disorder 04/20/2016  . Hyperglycemia 04/20/2016  . Abnormal CT scan, chest 06/03/2014  . Erectile dysfunction 06/03/2014  . Spinal stenosis of lumbar region 03/15/2014  . Cough 03/12/2013  . Allergic rhinitis 03/12/2013  . Leg pain, bilateral 12/30/2011  . Bladder neck obstruction 12/30/2011  . Preventative health care 12/25/2011  . Lumbar disc disease 12/25/2011  . S/P removal of thyroid nodule 12/25/2011  . History of MI (myocardial infarction) 12/25/2011  . Right knee meniscal tear 12/25/2011  . Degenerative arthritis of hip 12/25/2011  . HIP PAIN, RIGHT 05/14/2010  . FATIGUE 10/13/2009  . HYPERTENSION, BENIGN 02/25/2009  . CAD, AUTOLOGOUS BYPASS GRAFT 02/25/2009  . CORONARY ARTERY ANEURYSM 02/25/2009  . BACK PAIN 04/15/2008  . Hyperlipidemia 02/13/2008  . CORONARY ARTERY DISEASE 02/13/2008  . DIVERTICULOSIS, COLON 02/13/2008  . CARDIAC DISEASE, HX OF 02/13/2008  . COLONIC POLYPS, HX OF 02/13/2008     Sigurd Sos, PT 04/13/18 11:43 AM  [facility] [address] [place], [place], [address] Phone: [phone]   Fax:  [phone]  Name: Larry [name] MRN: [id number] Date of Birth: 1932/04/11

## 2018-04-17 ENCOUNTER — Ambulatory Visit: Payer: Medicare Other

## 2018-04-17 DIAGNOSIS — M48062 Spinal stenosis, lumbar region with neurogenic claudication: Secondary | ICD-10-CM | POA: Diagnosis not present

## 2018-04-17 DIAGNOSIS — M4316 Spondylolisthesis, lumbar region: Secondary | ICD-10-CM | POA: Diagnosis not present

## 2018-04-17 DIAGNOSIS — M5136 Other intervertebral disc degeneration, lumbar region: Secondary | ICD-10-CM | POA: Diagnosis not present

## 2018-04-17 DIAGNOSIS — R2689 Other abnormalities of gait and mobility: Secondary | ICD-10-CM | POA: Diagnosis not present

## 2018-04-17 DIAGNOSIS — Z6828 Body mass index (BMI) 28.0-28.9, adult: Secondary | ICD-10-CM | POA: Diagnosis not present

## 2018-04-17 DIAGNOSIS — M4155 Other secondary scoliosis, thoracolumbar region: Secondary | ICD-10-CM | POA: Diagnosis not present

## 2018-04-17 DIAGNOSIS — M6281 Muscle weakness (generalized): Secondary | ICD-10-CM | POA: Diagnosis not present

## 2018-04-17 DIAGNOSIS — M47816 Spondylosis without myelopathy or radiculopathy, lumbar region: Secondary | ICD-10-CM | POA: Diagnosis not present

## 2018-04-17 DIAGNOSIS — R030 Elevated blood-pressure reading, without diagnosis of hypertension: Secondary | ICD-10-CM | POA: Diagnosis not present

## 2018-04-17 NOTE — Therapy (Signed)
[facility] [address], [place], [place], [address] Phone: [phone]   Fax:  [phone]  Physical Therapy Treatment  Patient Details  Name: Larry [name] MRN: [id number] Date of Birth: December 17, 1932 Referring Provider: Jovita Gamma, MD   Encounter Date: 04/17/2018 Progress Note Reporting Period 03/02/18 to 04/17/18  See note below for Objective Data and Assessment of Progress/Goals.      PT End of Session - 04/17/18 1224    Visit Number  20    Date for PT Re-Evaluation  05/16/18    Authorization Type  Medicare: KX at 25    PT Start Time  1143    PT Stop Time  1226    PT Time Calculation (min)  43 min    Activity Tolerance  Patient tolerated treatment well    Behavior During Therapy  [facility] for tasks assessed/performed       Past Medical History:  Diagnosis Date  . Allergic rhinitis, cause unspecified 03/12/2013  . Allergy   . CAD (coronary artery disease)    S/P CABG in 1999   . Cataract   . Decreased exercise tolerance    Exertional fatigue  . Degenerative arthritis of hip   . Diverticulosis of colon   . ED (erectile dysfunction)   . Erectile dysfunction 06/03/2014  . History of colonic polyps   . History of kidney stones   . History of MI (myocardial infarction)   . Hx of adenomatous colonic polyps 2000  . Hyperlipidemia    Low HDL  . Lumbar disc disease    Hx of with recent back pain and buttock pain  . Myocardial infarction ([place]) 1999  . Right knee meniscal tear   . S/P removal of thyroid nodule   . Spinal stenosis of lumbar region 03/15/2014    Past Surgical History:  Procedure Laterality Date  . CARDIAC CATHETERIZATION    . CATARACT EXTRACTION    . COLONOSCOPY    . CORONARY ARTERY BYPASS GRAFT  1999  . CYSTOSCOPY/URETEROSCOPY/HOLMIUM LASER/STENT PLACEMENT Left 12/23/2017   Procedure: CYSTOSCOPY/RETROGRADE/URETEROSCOPY/HOLMIUM LASER/STENT PLACEMENT;  Surgeon: Festus Aloe, MD;   Location: [facility];  Service: Urology;  Laterality: Left;  ONLY NEEDS 60 MIN  . LUMBAR DISC SURGERY    . Nodules on Thyroid    . POLYPECTOMY    . PTCA    . SHOULDER ARTHROSCOPY WITH ROTATOR CUFF REPAIR AND SUBACROMIAL DECOMPRESSION Right 01/30/2015   Procedure: RIGHT ARTHROSCOPY SHOULDER SUBACROMIAL DECOMPRESSION DISTAL CLAVICAL RESECTION RETATOR CUFF REPAIR;  Surgeon: Justice Britain, MD;  Location: [place];  Service: Orthopedics;  Laterality: Right;  . TONSILLECTOMY    . VASECTOMY      There were no vitals filed for this visit.      [facility] PT Assessment - 04/17/18 0001      Assessment   Medical Diagnosis  spinal stenosis, lumbar region with neurogenic claudication      Cognition   Overall Cognitive Status  Within Functional Limits for tasks assessed      AROM   Overall AROM   Within functional limits for tasks performed      Strength   Overall Strength  Deficits    Right/Left Hip  Right;Left    Right Hip Flexion  4+/5    Left Hip Flexion  4+/5    Right Knee Flexion  4+/5    Right Knee Extension  4+/5    Left Knee Flexion  4+/5    Left Knee Extension  5/5  Right Ankle Dorsiflexion  4+/5    Left Ankle Dorsiflexion  4+/5      Flexibility   Soft Tissue Assessment /Muscle Length  -- not able to perform heel raise on Rt       6 minute walk test results    Aerobic Endurance Distance Walked  860    Endurance additional comments  no breaks taken                   [facility] Adult PT Treatment/Exercise - 04/17/18 0001      Lumbar Exercises: Aerobic   Nustep  Level 2x 10 minutes PT present to discuss progress      Knee/Hip Exercises: Standing   Heel Raises  Both;2 sets;10 reps on mini tramp    Hip Abduction  Stengthening;Both;2 sets;10 reps    Abduction Limitations  4# added    Hip Extension  Stengthening;Both;2 sets;10 reps    Extension Limitations  4# added    Functional Squat  2 sets;10 reps mini squat with UE support      Knee/Hip Exercises: Seated   Long Arc  Quad  2 sets;15 reps;Limitations    Long Arc Quad Weight  4 lbs.               PT Short Term Goals - 03/21/18 1240      PT SHORT TERM GOAL #2   Title  improve LE strength and endurance to stand for 15-20 minutes without rest    Baseline  15 minutes for shaving and showering    Status  Achieved      PT SHORT TERM GOAL #3   Title  improve 6 minute walk test to ambulate 750 feet     Status  Achieved        PT Long Term Goals - 04/17/18 1155      PT LONG TERM GOAL #1   Title  be independent in advanced HEP    Time  8    Period  Weeks    Status  On-going      PT LONG TERM GOAL #2   Title  reduce FOTO to < or = to 41% limitation    Status  Deferred      PT LONG TERM GOAL #3   Title  improve LE strength and endurance to walk for 15 minutes in the community without need to rest    Baseline  10 minutes    Time  8    Period  Weeks    Status  On-going      PT LONG TERM GOAL #4   Title  improve 6 minute walk test to ambulate > or = to 1000  feet     Baseline  860 feet- no rest breaks taken    Time  8    Period  Weeks    Status  On-going      PT LONG TERM GOAL #5   Title  demonstrate 4+/5 bil LE strength to improve safety and endurance on unlevel surfaces    Baseline  4+/5 bil LE strength in hips and knees.  DF 4+/5, PF on Rt 2/5, PF on Lt3+/5    Status  Achieved      PT LONG TERM GOAL #6   Title  improve LE strength to squat to a low cabinet with safety and demonstrate ability to rise to standing from this position    Baseline  Not yet able to do this  Time  8    Period  Weeks    Status  On-going            Plan - 04/17/18 1209    Clinical Impression Statement  Pt demonstrates improved Rt knee strength today.  Pt walked 860 feet in 6 minutes today without rest breaks taken.  Pt with improved stability with standing balance activities in the clinic with fewer verbal cues required and reduced guarding required by PT for safety.  Pt is able to ambulate home  distances without use of cane and reports improved stability.  Pt has made steady progress that is appropriate for chronic nature of continditon. Pt will continue to benefit from skilled PT for further strength and endurance advancement to improve independence and safety at home and in the community.      Rehab Potential  Good    PT Frequency  2x / week    PT Duration  8 weeks    PT Treatment/Interventions  ADLs/Self Care Home Management;Cryotherapy;Moist Heat;Balance training;Therapeutic exercise;Functional mobility training;Therapeutic activities;Gait training;Stair training;Neuromuscular re-education;Patient/family education;Passive range of motion;Manual techniques    PT Next Visit Plan  LE strength, sit to stand, balance, gait with heel to toe progression.  See what MD says    Consulted and Agree with Plan of Care  Patient       Patient will benefit from skilled therapeutic intervention in order to improve the following deficits and impairments:  Abnormal gait, Difficulty walking, Decreased balance, Decreased activity tolerance, Decreased mobility, Decreased coordination, Postural dysfunction  Visit Diagnosis: Muscle weakness (generalized)  Other abnormalities of gait and mobility     Problem List Patient Active Problem List   Diagnosis Date Noted  . Gait disorder 04/20/2016  . Hyperglycemia 04/20/2016  . Abnormal CT scan, chest 06/03/2014  . Erectile dysfunction 06/03/2014  . Spinal stenosis of lumbar region 03/15/2014  . Cough 03/12/2013  . Allergic rhinitis 03/12/2013  . Leg pain, bilateral 12/30/2011  . Bladder neck obstruction 12/30/2011  . Preventative health care 12/25/2011  . Lumbar disc disease 12/25/2011  . S/P removal of thyroid nodule 12/25/2011  . History of MI (myocardial infarction) 12/25/2011  . Right knee meniscal tear 12/25/2011  . Degenerative arthritis of hip 12/25/2011  . HIP PAIN, RIGHT 05/14/2010  . FATIGUE 10/13/2009  . HYPERTENSION, BENIGN  02/25/2009  . CAD, AUTOLOGOUS BYPASS GRAFT 02/25/2009  . CORONARY ARTERY ANEURYSM 02/25/2009  . BACK PAIN 04/15/2008  . Hyperlipidemia 02/13/2008  . CORONARY ARTERY DISEASE 02/13/2008  . DIVERTICULOSIS, COLON 02/13/2008  . CARDIAC DISEASE, HX OF 02/13/2008  . COLONIC POLYPS, HX OF 02/13/2008     Sigurd Sos, PT 04/17/18 12:27 PM  [facility] [address], [place], [place], [address] Phone: [phone]   Fax:  [phone]  Name: Larry [name] MRN: [id number] Date of Birth: 10/05/1932

## 2018-04-19 ENCOUNTER — Ambulatory Visit: Payer: Medicare Other | Attending: Neurosurgery

## 2018-04-19 DIAGNOSIS — M6281 Muscle weakness (generalized): Secondary | ICD-10-CM | POA: Diagnosis not present

## 2018-04-19 DIAGNOSIS — R2689 Other abnormalities of gait and mobility: Secondary | ICD-10-CM | POA: Insufficient documentation

## 2018-04-19 NOTE — Therapy (Signed)
[facility] [address] [place], [place], [address] Phone: [phone]   Fax:  [phone]  Physical Therapy Treatment  Patient Details  Name: Larry [name] MRN: [id number] Date of Birth: [birth year] Referring Provider: Jovita Gamma, MD   Encounter Date: 04/19/2018  PT End of Session - 04/19/18 1610    Visit Number  21    Date for PT Re-Evaluation  05/16/18    Authorization Type  Medicare: KX at 62    PT Start Time  3151    PT Stop Time  1611    PT Time Calculation (min)  40 min    Activity Tolerance  Patient tolerated treatment well    Behavior During Therapy  [facility] for tasks assessed/performed       Past Medical History:  Diagnosis Date  . Allergic rhinitis, cause unspecified 03/12/2013  . Allergy   . CAD (coronary artery disease)    S/P CABG in 1999   . Cataract   . Decreased exercise tolerance    Exertional fatigue  . Degenerative arthritis of hip   . Diverticulosis of colon   . ED (erectile dysfunction)   . Erectile dysfunction 06/03/2014  . History of colonic polyps   . History of kidney stones   . History of MI (myocardial infarction)   . Hx of adenomatous colonic polyps 2000  . Hyperlipidemia    Low HDL  . Lumbar disc disease    Hx of with recent back pain and buttock pain  . Myocardial infarction ([place]) 1999  . Right knee meniscal tear   . S/P removal of thyroid nodule   . Spinal stenosis of lumbar region 03/15/2014    Past Surgical History:  Procedure Laterality Date  . CARDIAC CATHETERIZATION    . CATARACT EXTRACTION    . COLONOSCOPY    . CORONARY ARTERY BYPASS GRAFT  1999  . CYSTOSCOPY/URETEROSCOPY/HOLMIUM LASER/STENT PLACEMENT Left 12/23/2017   Procedure: CYSTOSCOPY/RETROGRADE/URETEROSCOPY/HOLMIUM LASER/STENT PLACEMENT;  Surgeon: Festus Aloe, MD;  [facility];  Service: Urology;  Laterality: Left;  ONLY NEEDS 60 MIN  . LUMBAR DISC SURGERY    . Nodules on Thyroid    .  POLYPECTOMY    . PTCA    . SHOULDER ARTHROSCOPY WITH ROTATOR CUFF REPAIR AND SUBACROMIAL DECOMPRESSION Right 01/30/2015   Procedure: RIGHT ARTHROSCOPY SHOULDER SUBACROMIAL DECOMPRESSION DISTAL CLAVICAL RESECTION RETATOR CUFF REPAIR;  Surgeon: Justice Britain, MD;  Location: [place];  Service: Orthopedics;  Laterality: Right;  . TONSILLECTOMY    . VASECTOMY      There were no vitals filed for this visit.  Subjective Assessment - 04/19/18 1536    Subjective  I saw the MD.  I will see him again in 3 months.      Currently in Pain?  No/denies                       [facility] Adult PT Treatment/Exercise - 04/19/18 0001      Lumbar Exercises: Aerobic   Nustep  Level 2x 10 minutes PT present to discuss progress      Knee/Hip Exercises: Standing   Heel Raises  Both;2 sets;10 reps on mini tramp    Hip Abduction  Stengthening;Both;2 sets;10 reps    Abduction Limitations  4# added    Hip Extension  Stengthening;Both;2 sets;10 reps    Extension Limitations  4# added    Functional Squat  2 sets;10 reps mini squat with UE support    Rebounder  weight shifting 3 ways x 1 minute each    Gait Training  tandem stance 4x20 seconds with min UE support    Other Standing Knee Exercises  weaving in and out of dark blocks on the floor, tandem line walk, high marching, head turns- PT with CGA on gait belt    Other Standing Knee Exercises  standing on black pad: shoulder flexion x 10 bil      Knee/Hip Exercises: Seated   Long Arc Quad  2 sets;15 reps;Limitations    Long Arc Quad Weight  4 lbs.               PT Short Term Goals - 03/21/18 1240      PT SHORT TERM GOAL #2   Title  improve LE strength and endurance to stand for 15-20 minutes without rest    Baseline  15 minutes for shaving and showering    Status  Achieved      PT SHORT TERM GOAL #3   Title  improve 6 minute walk test to ambulate 750 feet     Status  Achieved        PT Long Term Goals - 04/17/18 1155      PT LONG  TERM GOAL #1   Title  be independent in advanced HEP    Time  8    Period  Weeks    Status  On-going      PT LONG TERM GOAL #2   Title  reduce FOTO to < or = to 41% limitation    Status  Deferred      PT LONG TERM GOAL #3   Title  improve LE strength and endurance to walk for 15 minutes in the community without need to rest    Baseline  10 minutes    Time  8    Period  Weeks    Status  On-going      PT LONG TERM GOAL #4   Title  improve 6 minute walk test to ambulate > or = to 1000  feet     Baseline  860 feet- no rest breaks taken    Time  8    Period  Weeks    Status  On-going      PT LONG TERM GOAL #5   Title  demonstrate 4+/5 bil LE strength to improve safety and endurance on unlevel surfaces    Baseline  4+/5 bil LE strength in hips and knees.  DF 4+/5, PF on Rt 2/5, PF on Lt3+/5    Status  Achieved      PT LONG TERM GOAL #6   Title  improve LE strength to squat to a low cabinet with safety and demonstrate ability to rise to standing from this position    Baseline  Not yet able to do this    Time  8    Period  Weeks    Status  On-going            Plan - 04/19/18 1539    Clinical Impression Statement  Pt demonstrates improved Rt knee strength today.  Pt with improved stability with standing balance activities in the clinic with fewer verbal cues and reduced guarding required by PT for safety.  Pt is able to ambulate home distances without sue of cane and reports improved stability at home.  Pt has made steady progress that is appropriate for chronic nature of condition.  Pt will continue to benefit from skilled PT  for further strength and endurance advancement to improve independence and safety at home and in the community.      Rehab Potential  Good    PT Frequency  2x / week    PT Duration  8 weeks    PT Treatment/Interventions  ADLs/Self Care Home Management;Cryotherapy;Moist Heat;Balance training;Therapeutic exercise;Functional mobility training;Therapeutic  activities;Gait training;Stair training;Neuromuscular re-education;Patient/family education;Passive range of motion;Manual techniques    PT Next Visit Plan  LE strength, sit to stand, balance, gait with heel to toe progression.     Consulted and Agree with Plan of Care  Patient       Patient will benefit from skilled therapeutic intervention in order to improve the following deficits and impairments:  Abnormal gait, Difficulty walking, Decreased balance, Decreased activity tolerance, Decreased mobility, Decreased coordination, Postural dysfunction  Visit Diagnosis: Muscle weakness (generalized)  Other abnormalities of gait and mobility     Problem List Patient Active Problem List   Diagnosis Date Noted  . Gait disorder 04/20/2016  . Hyperglycemia 04/20/2016  . Abnormal CT scan, chest 06/03/2014  . Erectile dysfunction 06/03/2014  . Spinal stenosis of lumbar region 03/15/2014  . Cough 03/12/2013  . Allergic rhinitis 03/12/2013  . Leg pain, bilateral 12/30/2011  . Bladder neck obstruction 12/30/2011  . Preventative health care 12/25/2011  . Lumbar disc disease 12/25/2011  . S/P removal of thyroid nodule 12/25/2011  . History of MI (myocardial infarction) 12/25/2011  . Right knee meniscal tear 12/25/2011  . Degenerative arthritis of hip 12/25/2011  . HIP PAIN, RIGHT 05/14/2010  . FATIGUE 10/13/2009  . HYPERTENSION, BENIGN 02/25/2009  . CAD, AUTOLOGOUS BYPASS GRAFT 02/25/2009  . CORONARY ARTERY ANEURYSM 02/25/2009  . BACK PAIN 04/15/2008  . Hyperlipidemia 02/13/2008  . CORONARY ARTERY DISEASE 02/13/2008  . DIVERTICULOSIS, COLON 02/13/2008  . CARDIAC DISEASE, HX OF 02/13/2008  . COLONIC POLYPS, HX OF 02/13/2008     Sigurd Sos, PT 04/19/18 4:12 PM  [facility] [address], [place], [place], [address] Phone: [phone]   Fax:  [phone]  Name: Aqib Lough MRN: [id number] Date of Birth:  [birth year]

## 2018-05-01 ENCOUNTER — Ambulatory Visit: Payer: Medicare Other

## 2018-05-01 DIAGNOSIS — R2689 Other abnormalities of gait and mobility: Secondary | ICD-10-CM | POA: Diagnosis not present

## 2018-05-01 DIAGNOSIS — M6281 Muscle weakness (generalized): Secondary | ICD-10-CM | POA: Diagnosis not present

## 2018-05-01 NOTE — Therapy (Signed)
[facility] [address] [place], [place], [address] Phone: [phone]   Fax:  [phone]  Physical Therapy Treatment  Patient Details  Name: Larry [name] MRN: [id number] Date of Birth: Aug 22, 1932 Referring Provider: Jovita Gamma, MD   Encounter Date: 05/01/2018  PT End of Session - 05/01/18 1444    Visit Number  22    Date for PT Re-Evaluation  05/16/18    Authorization Type  Medicare: KX at 33    PT Start Time  1400    PT Stop Time  1444    PT Time Calculation (min)  44 min    Activity Tolerance  Patient tolerated treatment well    Behavior During Therapy  [facility] for tasks assessed/performed       Past Medical History:  Diagnosis Date  . Allergic rhinitis, cause unspecified 03/12/2013  . Allergy   . CAD (coronary artery disease)    S/P CABG in 1999   . Cataract   . Decreased exercise tolerance    Exertional fatigue  . Degenerative arthritis of hip   . Diverticulosis of colon   . ED (erectile dysfunction)   . Erectile dysfunction 06/03/2014  . History of colonic polyps   . History of kidney stones   . History of MI (myocardial infarction)   . Hx of adenomatous colonic polyps 2000  . Hyperlipidemia    Low HDL  . Lumbar disc disease    Hx of with recent back pain and buttock pain  . Myocardial infarction ([place]) 1999  . Right knee meniscal tear   . S/P removal of thyroid nodule   . Spinal stenosis of lumbar region 03/15/2014    Past Surgical History:  Procedure Laterality Date  . CARDIAC CATHETERIZATION    . CATARACT EXTRACTION    . COLONOSCOPY    . CORONARY ARTERY BYPASS GRAFT  1999  . CYSTOSCOPY/URETEROSCOPY/HOLMIUM LASER/STENT PLACEMENT Left 12/23/2017   Procedure: CYSTOSCOPY/RETROGRADE/URETEROSCOPY/HOLMIUM LASER/STENT PLACEMENT;  Surgeon: Festus Aloe, MD;  [facility];  Service: Urology;  Laterality: Left;  ONLY NEEDS 60 MIN  . LUMBAR DISC SURGERY    . Nodules on Thyroid    .  POLYPECTOMY    . PTCA    . SHOULDER ARTHROSCOPY WITH ROTATOR CUFF REPAIR AND SUBACROMIAL DECOMPRESSION Right 01/30/2015   Procedure: RIGHT ARTHROSCOPY SHOULDER SUBACROMIAL DECOMPRESSION DISTAL CLAVICAL RESECTION RETATOR CUFF REPAIR;  Surgeon: Justice Britain, MD;  Location: [place];  Service: Orthopedics;  Laterality: Right;  . TONSILLECTOMY    . VASECTOMY      There were no vitals filed for this visit.  Subjective Assessment - 05/01/18 1407    Subjective  I had company last week so I wasn't able to be here.   I was able to do a little bit of exercise.  I havent declined..    Patient Stated Goals  improve endurance for standing and walking    Currently in Pain?  No/denies                       [facility] Adult PT Treatment/Exercise - 05/01/18 0001      Lumbar Exercises: Aerobic   Nustep  Level 2x 10 minutes PT present to discuss progress      Knee/Hip Exercises: Standing   Heel Raises  Both;2 sets;10 reps on mini tramp    Hip Abduction  Stengthening;Both;2 sets;10 reps    Abduction Limitations  4# added    Hip Extension  Stengthening;Both;2 sets;10 reps  Extension Limitations  4# added    Functional Squat  2 sets;10 reps mini squat with UE support    Rebounder  weight shifting 3 ways x 1 minute each    Gait Training  tandem stance 4x20 seconds with min UE support    Other Standing Knee Exercises  weaving in and out of dark blocks on the floor, tandem line walk, high marching, head turns- PT with CGA on gait belt    Other Standing Knee Exercises  standing on black pad: shoulder flexion x 10 bil      Knee/Hip Exercises: Seated   Long Arc Quad  2 sets;15 reps;Limitations    Long Arc Quad Weight  4 lbs.               PT Short Term Goals - 03/21/18 1240      PT SHORT TERM GOAL #2   Title  improve LE strength and endurance to stand for 15-20 minutes without rest    Baseline  15 minutes for shaving and showering    Status  Achieved      PT SHORT TERM GOAL #3    Title  improve 6 minute walk test to ambulate 750 feet     Status  Achieved        PT Long Term Goals - 04/17/18 1155      PT LONG TERM GOAL #1   Title  be independent in advanced HEP    Time  8    Period  Weeks    Status  On-going      PT LONG TERM GOAL #2   Title  reduce FOTO to < or = to 41% limitation    Status  Deferred      PT LONG TERM GOAL #3   Title  improve LE strength and endurance to walk for 15 minutes in the community without need to rest    Baseline  10 minutes    Time  8    Period  Weeks    Status  On-going      PT LONG TERM GOAL #4   Title  improve 6 minute walk test to ambulate > or = to 1000  feet     Baseline  860 feet- no rest breaks taken    Time  8    Period  Weeks    Status  On-going      PT LONG TERM GOAL #5   Title  demonstrate 4+/5 bil LE strength to improve safety and endurance on unlevel surfaces    Baseline  4+/5 bil LE strength in hips and knees.  DF 4+/5, PF on Rt 2/5, PF on Lt3+/5    Status  Achieved      PT LONG TERM GOAL #6   Title  improve LE strength to squat to a low cabinet with safety and demonstrate ability to rise to standing from this position    Baseline  Not yet able to do this    Time  8    Period  Weeks    Status  On-going            Plan - 05/01/18 1410    Clinical Impression Statement  Pt missed a week of PT due to having company.  Pt continues to make steady progress with balance and endurance. Pt requires fewer verbal cues for posture and technique with standing exercise in the clinic.  Pt demonstrates improved stability with standing balance activities.  Pt will  continue to benefit from skilled PT for futher strength and endurance advancmeent to improve independence and safety at home and community.      Rehab Potential  Good    PT Frequency  2x / week    PT Duration  8 weeks    PT Treatment/Interventions  ADLs/Self Care Home Management;Cryotherapy;Moist Heat;Balance training;Therapeutic exercise;Functional  mobility training;Therapeutic activities;Gait training;Stair training;Neuromuscular re-education;Patient/family education;Passive range of motion;Manual techniques    PT Next Visit Plan  LE strength, sit to stand, balance, gait with heel to toe progression.     Consulted and Agree with Plan of Care  Patient       Patient will benefit from skilled therapeutic intervention in order to improve the following deficits and impairments:  Abnormal gait, Difficulty walking, Decreased balance, Decreased activity tolerance, Decreased mobility, Decreased coordination, Postural dysfunction  Visit Diagnosis: Muscle weakness (generalized)  Other abnormalities of gait and mobility     Problem List Patient Active Problem List   Diagnosis Date Noted  . Gait disorder 04/20/2016  . Hyperglycemia 04/20/2016  . Abnormal CT scan, chest 06/03/2014  . Erectile dysfunction 06/03/2014  . Spinal stenosis of lumbar region 03/15/2014  . Cough 03/12/2013  . Allergic rhinitis 03/12/2013  . Leg pain, bilateral 12/30/2011  . Bladder neck obstruction 12/30/2011  . Preventative health care 12/25/2011  . Lumbar disc disease 12/25/2011  . S/P removal of thyroid nodule 12/25/2011  . History of MI (myocardial infarction) 12/25/2011  . Right knee meniscal tear 12/25/2011  . Degenerative arthritis of hip 12/25/2011  . HIP PAIN, RIGHT 05/14/2010  . FATIGUE 10/13/2009  . HYPERTENSION, BENIGN 02/25/2009  . CAD, AUTOLOGOUS BYPASS GRAFT 02/25/2009  . CORONARY ARTERY ANEURYSM 02/25/2009  . BACK PAIN 04/15/2008  . Hyperlipidemia 02/13/2008  . CORONARY ARTERY DISEASE 02/13/2008  . DIVERTICULOSIS, COLON 02/13/2008  . CARDIAC DISEASE, HX OF 02/13/2008  . COLONIC POLYPS, HX OF 02/13/2008     Sigurd Sos, PT 05/01/18 2:49 PM  [facility] [address], [place], [place], [address] Phone: [phone]   Fax:  [phone]  Name: Larry [name] MRN:  [id number] Date of Birth: [birth year]

## 2018-05-02 ENCOUNTER — Telehealth: Payer: Self-pay | Admitting: Cardiovascular Disease

## 2018-05-02 DIAGNOSIS — D1800 Hemangioma unspecified site: Secondary | ICD-10-CM | POA: Diagnosis not present

## 2018-05-02 DIAGNOSIS — E785 Hyperlipidemia, unspecified: Secondary | ICD-10-CM

## 2018-05-02 DIAGNOSIS — D225 Melanocytic nevi of trunk: Secondary | ICD-10-CM | POA: Diagnosis not present

## 2018-05-02 DIAGNOSIS — L570 Actinic keratosis: Secondary | ICD-10-CM | POA: Diagnosis not present

## 2018-05-02 DIAGNOSIS — L821 Other seborrheic keratosis: Secondary | ICD-10-CM | POA: Diagnosis not present

## 2018-05-02 DIAGNOSIS — L814 Other melanin hyperpigmentation: Secondary | ICD-10-CM | POA: Diagnosis not present

## 2018-05-02 DIAGNOSIS — L309 Dermatitis, unspecified: Secondary | ICD-10-CM | POA: Diagnosis not present

## 2018-05-02 DIAGNOSIS — Z85828 Personal history of other malignant neoplasm of skin: Secondary | ICD-10-CM | POA: Diagnosis not present

## 2018-05-02 DIAGNOSIS — L219 Seborrheic dermatitis, unspecified: Secondary | ICD-10-CM | POA: Diagnosis not present

## 2018-05-02 NOTE — Telephone Encounter (Signed)
New message  Pt wife verbalzied that she is calling for RN  To see if pt needs labs done before 05/29/2018 or same day

## 2018-05-02 NOTE — Telephone Encounter (Signed)
I spoke with pt's wife who reports pt has not had cholesterol labs checked anywhere else.  I scheduled pt for fasting lipid and liver profiles on 05/23/18

## 2018-05-02 NOTE — Progress Notes (Deleted)
No chief complaint on file.   History of Present Illness: 84 yo male with history of CAD s/p 3V CABG in 1999, HLD here for cardiac follow up. He had 3V CABG in 1999 at [facility] in [place]. In 2008 he had a catheterization and was found to have a patent LIMA to LAD and a patent vein graft to the right coronary and no significant obstruction in the circumflex artery. He has been doing well since that time. Exercise stress test April 2015 with no ischemia. He has had PVCs noted on cardiac monitor and was started on Cardizem but he did not tolerate the Cardizem due to dizziness so it was stopped.   *** is here today for follow up. The patient denies any chest pain, dyspnea, palpitations, lower extremity edema, orthopnea, PND, dizziness, near syncope or syncope.   Primary Care Physician: Biagio Borg, MD  Past Medical History:  Diagnosis Date  . Allergic rhinitis, cause unspecified 03/12/2013  . Allergy   . CAD (coronary artery disease)    S/P CABG in 1999   . Cataract   . Decreased exercise tolerance    Exertional fatigue  . Degenerative arthritis of hip   . Diverticulosis of colon   . ED (erectile dysfunction)   . Erectile dysfunction 06/03/2014  . History of colonic polyps   . History of kidney stones   . History of MI (myocardial infarction)   . Hx of adenomatous colonic polyps 2000  . Hyperlipidemia    Low HDL  . Lumbar disc disease    Hx of with recent back pain and buttock pain  . Myocardial infarction ([place]) 1999  . Right knee meniscal tear   . S/P removal of thyroid nodule   . Spinal stenosis of lumbar region 03/15/2014    Past Surgical History:  Procedure Laterality Date  . CARDIAC CATHETERIZATION    . CATARACT EXTRACTION    . COLONOSCOPY    . CORONARY ARTERY BYPASS GRAFT  1999  . CYSTOSCOPY/URETEROSCOPY/HOLMIUM LASER/STENT PLACEMENT Left 12/23/2017   Procedure: CYSTOSCOPY/RETROGRADE/URETEROSCOPY/HOLMIUM LASER/STENT PLACEMENT;  Surgeon: Festus Aloe,  MD;  Location: [facility];  Service: Urology;  Laterality: Left;  ONLY NEEDS 60 MIN  . LUMBAR DISC SURGERY    . Nodules on Thyroid    . POLYPECTOMY    . PTCA    . SHOULDER ARTHROSCOPY WITH ROTATOR CUFF REPAIR AND SUBACROMIAL DECOMPRESSION Right 01/30/2015   Procedure: RIGHT ARTHROSCOPY SHOULDER SUBACROMIAL DECOMPRESSION DISTAL CLAVICAL RESECTION RETATOR CUFF REPAIR;  Surgeon: Justice Britain, MD;  Location: [facility];  Service: Orthopedics;  Laterality: Right;  . TONSILLECTOMY    . VASECTOMY      Current Outpatient Medications  Medication Sig Dispense Refill  . acetaminophen (TYLENOL) 650 MG CR tablet Take 650 mg by mouth every 8 (eight) hours as needed for pain.     Marland Kitchen aspirin 81 MG EC tablet Take 81 mg by mouth at bedtime.     . calcium-vitamin D (OSCAL WITH D) 500-200 MG-UNIT per tablet Take 1 tablet by mouth 2 (two) times daily.     . cetirizine (ZYRTEC) 10 MG tablet Take 10 mg by mouth daily.    . clopidogrel (PLAVIX) 75 MG tablet Take 1 tablet (75 mg total) by mouth daily. Please keep upcoming appt for future refills. Thank you 90 tablet 0  . fish oil-omega-3 fatty acids 1000 MG capsule Take 1 g by mouth 2 (two) times daily.     . folic acid (FOLVITE) 694 MCG  tablet Take 400 mcg by mouth daily.      Marland Kitchen HYDROcodone-acetaminophen (NORCO) 5-325 MG tablet Take 1 tablet by mouth every 4 (four) hours as needed for severe pain. 10 tablet 0  . Multiple Vitamin (MULTIVITAMIN WITH MINERALS) TABS tablet Take 1 tablet by mouth daily. Centrum Silver    . predniSONE (DELTASONE) 10 MG tablet 2 tabs by mouth per day for 5 days 10 tablet 0  . rosuvastatin (CRESTOR) 5 MG tablet TAKE 1 TABLET BY MOUTH DAILY ALTERNATING  WITH  2  TABLETS  EVERY DAY. Please keep upcoming appt for future refills. Thank you 135 tablet 0  . tamsulosin (FLOMAX) 0.4 MG CAPS capsule Take 1 capsule (0.4 mg total) by mouth daily. 30 capsule 5  . triamcinolone (NASACORT) 55 MCG/ACT AERO nasal inhaler Place 2 sprays into the nose daily. 1 Inhaler  12   No current facility-administered medications for this visit.     Allergies  Allergen Reactions  . Beta Adrenergic Blockers Other (See Comments)    Dropped blood pressure too low  . Simvastatin Other (See Comments)    myalgia    Social History   Socioeconomic History  . Marital status: Married    Spouse name: Not on file  . Number of children: 2  . Years of education: Not on file  . Highest education level: Not on file  Occupational History  . 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 Financial resource strain: Not on file  . Food insecurity:    Worry: Not on file    Inability: Not on file  . Transportation needs:    Medical: Not on file    Non-medical: Not on file  Tobacco Use  . Smoking status: Never Smoker  . Smokeless tobacco: Never Used  Substance and Sexual Activity  . Alcohol use: Yes    Alcohol/week: 0.0 oz    Comment: rare  . Drug use: No  . Sexual activity: Not on file  Lifestyle  . Physical activity:    Days per week: Not on file    Minutes per session: Not on file  . Stress: Not on file  Relationships  . Social connections:    Talks on phone: Not on file    Gets together: Not on file    Attends religious service: Not on file    Active member of club or organization: Not on file    Attends meetings of clubs or organizations: Not on file    Relationship status: Not on file  . Intimate partner violence:    Fear of current or ex partner: Not on file    Emotionally abused: Not on file    Physically abused: Not on file    Forced sexual activity: Not on file  Other Topics Concern  . Not on file  Social History Narrative   Moved from [facility]   Married      [employer] on file    Family History  Problem Relation Age of Onset  . Prostate cancer Other   . Prostate cancer Other   . Heart disease Mother   . Heart disease Brother   . Colon cancer Neg Hx   . Rectal cancer Neg Hx   . Stomach  cancer Neg Hx     Review of Systems:  As stated in the HPI and otherwise negative.   There were no vitals taken for this visit.  Physical Examination:  General:  Well developed, well nourished, NAD  HEENT: OP clear, mucus membranes moist  SKIN: warm, dry. No rashes. Neuro: No focal deficits  Musculoskeletal: Muscle strength 5/5 all ext  Psychiatric: Mood and affect normal  Neck: No JVD, no carotid bruits, no thyromegaly, no lymphadenopathy.  Lungs:Clear bilaterally, no wheezes, rhonci, crackles Cardiovascular: Regular rate and rhythm. No murmurs, gallops or rubs. Abdomen:Soft. Bowel sounds present. Non-tender.  Extremities: No lower extremity edema. Pulses are 2 + in the bilateral DP/PT.  Echo 10/13: Left ventricle: The cavity size was mildly dilated. Wall thickness was increased in a pattern of mild LVH. Systolic function was normal. The estimated ejection fraction was in the range of 50% to 55%. Wall motion was normal; there were no regional wall motion abnormalities. - Aortic valve: Trivial regurgitation. - Mitral valve: Mild regurgitation. - Left atrium: The atrium was mildly dilated. - Atrial septum: No defect or patent foramen ovale was Identified.  EKG:  EKG is *** ordered today. The ekg ordered today demonstrates    Recent Labs: 11/22/2017: ALT 22 12/19/2017: BUN 18; Creatinine, Ser 1.00; Hemoglobin 16.0; Platelets 222; Potassium 3.7; Sodium 140   Lipid Panel    Component Value Date/Time   CHOL 102 04/08/2017 0919   CHOL 118 04/03/2014 0843   TRIG 128 04/08/2017 0919   TRIG 162 (H) 04/03/2014 0843   HDL 35 (L) 04/08/2017 0919   HDL 36 (L) 04/03/2014 0843   CHOLHDL 2.9 04/08/2017 0919   CHOLHDL 4 04/21/2016 0910   VLDL 28.2 04/21/2016 0910   LDLCALC 41 04/08/2017 0919   LDLCALC 50 04/03/2014 0843   LDLDIRECT 91.5 02/13/2008 1400     Wt Readings from Last 3 Encounters:  12/23/17 178 lb (80.7 kg)  12/19/17 178 lb (80.7 kg)  08/31/17 175 lb (79.4 kg)       Other studies Reviewed: Additional studies/ records that were reviewed today include: . Review of the above records demonstrates:    Assessment and Plan:   1. CAD s/p CABG without angina: No chest pain. Will continue ASA, Plavix and statin.      2. PVCs: He has no palpitations. He did not tolerate Cardizem in the past. NO changes today.     3. HLD: LDL at goal. I will continue his statin. Check lipids and LFTs today.   Current medicines are reviewed at length with the patient today.  The patient does not have concerns regarding medicines.  The following changes have been made:  no change  Labs/ tests ordered today include:   No orders of the defined types were placed in this encounter.   Disposition:   FU with me in *** 6 months  Signed, Lauree Chandler, MD 05/02/2018 10:46 AM    [facility] [place], [place], [place]  [address] Phone: [phone]; Fax: [phone]

## 2018-05-03 ENCOUNTER — Ambulatory Visit: Payer: Medicare Other

## 2018-05-03 ENCOUNTER — Ambulatory Visit: Payer: Medicare Other | Admitting: Cardiovascular Disease

## 2018-05-03 DIAGNOSIS — R2689 Other abnormalities of gait and mobility: Secondary | ICD-10-CM

## 2018-05-03 DIAGNOSIS — M6281 Muscle weakness (generalized): Secondary | ICD-10-CM

## 2018-05-03 NOTE — Therapy (Signed)
[facility] [address], [place], [place], [address] Phone: [phone]   Fax:  [phone]  Physical Therapy Treatment  Patient Details  Name: Larry [name] MRN: [id number] Date of Birth: [birth year] Referring Provider: Jovita Gamma, MD   Encounter Date: 05/03/2018  PT End of Session - 05/03/18 1426    Visit Number  23    Date for PT Re-Evaluation  05/16/18    Authorization Type  Medicare: KX     PT Start Time  3888    PT Stop Time  1433    PT Time Calculation (min)  34 min    Activity Tolerance  Patient limited by fatigue    Behavior During Therapy  [facility] for tasks assessed/performed       Past Medical History:  Diagnosis Date  . Allergic rhinitis, cause unspecified 03/12/2013  . Allergy   . CAD (coronary artery disease)    S/P CABG in 1999   . Cataract   . Decreased exercise tolerance    Exertional fatigue  . Degenerative arthritis of hip   . Diverticulosis of colon   . ED (erectile dysfunction)   . Erectile dysfunction 06/03/2014  . History of colonic polyps   . History of kidney stones   . History of MI (myocardial infarction)   . Hx of adenomatous colonic polyps 2000  . Hyperlipidemia    Low HDL  . Lumbar disc disease    Hx of with recent back pain and buttock pain  . Myocardial infarction ([place]) 1999  . Right knee meniscal tear   . S/P removal of thyroid nodule   . Spinal stenosis of lumbar region 03/15/2014    Past Surgical History:  Procedure Laterality Date  . CARDIAC CATHETERIZATION    . CATARACT EXTRACTION    . COLONOSCOPY    . CORONARY ARTERY BYPASS GRAFT  1999  . CYSTOSCOPY/URETEROSCOPY/HOLMIUM LASER/STENT PLACEMENT Left 12/23/2017   Procedure: CYSTOSCOPY/RETROGRADE/URETEROSCOPY/HOLMIUM LASER/STENT PLACEMENT;  Surgeon: Festus Aloe, MD;  [facility];  Service: Urology;  Laterality: Left;  ONLY NEEDS 60 MIN  . LUMBAR DISC SURGERY    . Nodules on Thyroid    . POLYPECTOMY     . PTCA    . SHOULDER ARTHROSCOPY WITH ROTATOR CUFF REPAIR AND SUBACROMIAL DECOMPRESSION Right 01/30/2015   Procedure: RIGHT ARTHROSCOPY SHOULDER SUBACROMIAL DECOMPRESSION DISTAL CLAVICAL RESECTION RETATOR CUFF REPAIR;  Surgeon: Justice Britain, MD;  Location: [place];  Service: Orthopedics;  Laterality: Right;  . TONSILLECTOMY    . VASECTOMY      There were no vitals filed for this visit.  Subjective Assessment - 05/03/18 1400    Subjective  I am doing well.      Patient Stated Goals  improve endurance for standing and walking    Currently in Pain?  No/denies                       [facility] Adult PT Treatment/Exercise - 05/03/18 0001      Lumbar Exercises: Aerobic   Nustep  Level 2x 10 minutes PT present to discuss progress      Knee/Hip Exercises: Standing   Heel Raises  Both;2 sets;10 reps on mini tramp    Hip Abduction  Stengthening;Both;2 sets;10 reps    Abduction Limitations  4# added    Hip Extension  Stengthening;Both;2 sets;10 reps    Extension Limitations  4# added    Functional Squat  --    Rebounder  --  Gait Training  tandem stance 4x20 seconds with min UE support    Other Standing Knee Exercises  weaving in and out of dark blocks on the floor, tandem line walk, high marching, head turns- PT with CGA on gait belt    Other Standing Knee Exercises  standing on black pad: shoulder flexion x 10 bil      Knee/Hip Exercises: Seated   Long Arc Quad  2 sets;15 reps;Limitations    Long Arc Quad Weight  4 lbs.               PT Short Term Goals - 03/21/18 1240      PT SHORT TERM GOAL #2   Title  improve LE strength and endurance to stand for 15-20 minutes without rest    Baseline  15 minutes for shaving and showering    Status  Achieved      PT SHORT TERM GOAL #3   Title  improve 6 minute walk test to ambulate 750 feet     Status  Achieved        PT Long Term Goals - 04/17/18 1155      PT LONG TERM GOAL #1   Title  be independent in advanced  HEP    Time  8    Period  Weeks    Status  On-going      PT LONG TERM GOAL #2   Title  reduce FOTO to < or = to 41% limitation    Status  Deferred      PT LONG TERM GOAL #3   Title  improve LE strength and endurance to walk for 15 minutes in the community without need to rest    Baseline  10 minutes    Time  8    Period  Weeks    Status  On-going      PT LONG TERM GOAL #4   Title  improve 6 minute walk test to ambulate > or = to 1000  feet     Baseline  860 feet- no rest breaks taken    Time  8    Period  Weeks    Status  On-going      PT LONG TERM GOAL #5   Title  demonstrate 4+/5 bil LE strength to improve safety and endurance on unlevel surfaces    Baseline  4+/5 bil LE strength in hips and knees.  DF 4+/5, PF on Rt 2/5, PF on Lt3+/5    Status  Achieved      PT LONG TERM GOAL #6   Title  improve LE strength to squat to a low cabinet with safety and demonstrate ability to rise to standing from this position    Baseline  Not yet able to do this    Time  8    Period  Weeks    Status  On-going            Plan - 05/03/18 1413    Clinical Impression Statement  Pt continues to make steady gains with balance and strength/endurance.  Pt with increased difficulty with tandem stance today and with dynamic balance activities and required increased guard today.  Pt reports improved stability at home with reduced use of cane.  Session ended early due to fatigue today.   Pt will continue to benefit from skilled PT for strength, endurance and balance exercises.      Rehab Potential  Good    PT Frequency  2x / week  PT Duration  8 weeks    PT Treatment/Interventions  ADLs/Self Care Home Management;Cryotherapy;Moist Heat;Balance training;Therapeutic exercise;Functional mobility training;Therapeutic activities;Gait training;Stair training;Neuromuscular re-education;Patient/family education;Passive range of motion;Manual techniques    PT Next Visit Plan  LE strength, sit to stand,  balance, gait with heel to toe progression.     Recommended Other Services  initial cert and recertification is signed.      Consulted and Agree with Plan of Care  Patient       Patient will benefit from skilled therapeutic intervention in order to improve the following deficits and impairments:  Abnormal gait, Difficulty walking, Decreased balance, Decreased activity tolerance, Decreased mobility, Decreased coordination, Postural dysfunction  Visit Diagnosis: Muscle weakness (generalized)  Other abnormalities of gait and mobility     Problem List Patient Active Problem List   Diagnosis Date Noted  . Gait disorder 04/20/2016  . Hyperglycemia 04/20/2016  . Abnormal CT scan, chest 06/03/2014  . Erectile dysfunction 06/03/2014  . Spinal stenosis of lumbar region 03/15/2014  . Cough 03/12/2013  . Allergic rhinitis 03/12/2013  . Leg pain, bilateral 12/30/2011  . Bladder neck obstruction 12/30/2011  . Preventative health care 12/25/2011  . Lumbar disc disease 12/25/2011  . S/P removal of thyroid nodule 12/25/2011  . History of MI (myocardial infarction) 12/25/2011  . Right knee meniscal tear 12/25/2011  . Degenerative arthritis of hip 12/25/2011  . HIP PAIN, RIGHT 05/14/2010  . FATIGUE 10/13/2009  . HYPERTENSION, BENIGN 02/25/2009  . CAD, AUTOLOGOUS BYPASS GRAFT 02/25/2009  . CORONARY ARTERY ANEURYSM 02/25/2009  . BACK PAIN 04/15/2008  . Hyperlipidemia 02/13/2008  . CORONARY ARTERY DISEASE 02/13/2008  . DIVERTICULOSIS, COLON 02/13/2008  . CARDIAC DISEASE, HX OF 02/13/2008  . COLONIC POLYPS, HX OF 02/13/2008     Sigurd Sos, PT 05/03/18 2:28 PM  [facility] [address] [place], [place], [address] Phone: [phone]   Fax:  [phone]  Name: Larry [name] MRN: [id number] Date of Birth: October 08, 1932

## 2018-05-08 ENCOUNTER — Ambulatory Visit: Payer: Medicare Other

## 2018-05-08 DIAGNOSIS — M6281 Muscle weakness (generalized): Secondary | ICD-10-CM | POA: Diagnosis not present

## 2018-05-08 DIAGNOSIS — R2689 Other abnormalities of gait and mobility: Secondary | ICD-10-CM | POA: Diagnosis not present

## 2018-05-08 NOTE — Therapy (Signed)
[facility] [address], [place], [place], [address] Phone: [phone]   Fax:  [phone]  Physical Therapy Evaluation  Patient Details  Name: Larry [name] MRN: [id number] Date of Birth: 12-03-1932 Referring Provider: Jovita Gamma, MD   Encounter Date: 05/08/2018  PT End of Session - 05/08/18 1234    Visit Number  24    Date for PT Re-Evaluation  05/16/18    Authorization Type  Medicare: KX     PT Start Time  2458    PT Stop Time  1229    PT Time Calculation (min)  41 min    Activity Tolerance  Patient tolerated treatment well    Behavior During Therapy  [facility] for tasks assessed/performed       Past Medical History:  Diagnosis Date  . Allergic rhinitis, cause unspecified 03/12/2013  . Allergy   . CAD (coronary artery disease)    S/P CABG in 1999   . Cataract   . Decreased exercise tolerance    Exertional fatigue  . Degenerative arthritis of hip   . Diverticulosis of colon   . ED (erectile dysfunction)   . Erectile dysfunction 06/03/2014  . History of colonic polyps   . History of kidney stones   . History of MI (myocardial infarction)   . Hx of adenomatous colonic polyps 2000  . Hyperlipidemia    Low HDL  . Lumbar disc disease    Hx of with recent back pain and buttock pain  . Myocardial infarction ([place]) 1999  . Right knee meniscal tear   . S/P removal of thyroid nodule   . Spinal stenosis of lumbar region 03/15/2014    Past Surgical History:  Procedure Laterality Date  . CARDIAC CATHETERIZATION    . CATARACT EXTRACTION    . COLONOSCOPY    . CORONARY ARTERY BYPASS GRAFT  1999  . CYSTOSCOPY/URETEROSCOPY/HOLMIUM LASER/STENT PLACEMENT Left 12/23/2017   Procedure: CYSTOSCOPY/RETROGRADE/URETEROSCOPY/HOLMIUM LASER/STENT PLACEMENT;  Surgeon: Festus Aloe, MD;  [facility];  Service: Urology;  Laterality: Left;  ONLY NEEDS 60 MIN  . LUMBAR DISC SURGERY    . Nodules on Thyroid    .  POLYPECTOMY    . PTCA    . SHOULDER ARTHROSCOPY WITH ROTATOR CUFF REPAIR AND SUBACROMIAL DECOMPRESSION Right 01/30/2015   Procedure: RIGHT ARTHROSCOPY SHOULDER SUBACROMIAL DECOMPRESSION DISTAL CLAVICAL RESECTION RETATOR CUFF REPAIR;  Surgeon: Justice Britain, MD;  Location: [facility];  Service: Orthopedics;  Laterality: Right;  . TONSILLECTOMY    . VASECTOMY      There were no vitals filed for this visit.   Subjective Assessment - 05/08/18 1152    Subjective  I worked on cleaning my patio yesterday and I was pretty tired.  I was still able to do my exercises.      Currently in Pain?  No/denies                    Objective measurements completed on examination: See above findings.      [place] [place] Adult PT Treatment/Exercise - 05/08/18 0001      Lumbar Exercises: Aerobic   Nustep  Level 2x 10 minutes PT present to discuss progress      Knee/Hip Exercises: Standing   Heel Raises  Both;2 sets;10 reps on mini tramp    Hip Abduction  Stengthening;Both;2 sets;10 reps    Abduction Limitations  4# added    Hip Extension  Stengthening;Both;2 sets;10 reps    Extension Limitations  4# added  Gait Training  tandem stance 4x20 seconds with min UE support    Other Standing Knee Exercises  weaving in and out of dark blocks on the floor, tandem line walk, high marching, head turns- PT with CGA on gait belt    Other Standing Knee Exercises  standing on black pad: shoulder flexion x 10 bil      Knee/Hip Exercises: Seated   Long Arc Quad  2 sets;15 reps;Limitations    Long Arc Quad Weight  4 lbs.               PT Short Term Goals - 03/21/18 1240      PT SHORT TERM GOAL #2   Title  improve LE strength and endurance to stand for 15-20 minutes without rest    Baseline  15 minutes for shaving and showering    Status  Achieved      PT SHORT TERM GOAL #3   Title  improve 6 minute walk test to ambulate 750 feet     Status  Achieved        PT Long Term Goals - 04/17/18 1155       PT LONG TERM GOAL #1   Title  be independent in advanced HEP    Time  8    Period  Weeks    Status  On-going      PT LONG TERM GOAL #2   Title  reduce FOTO to < or = to 41% limitation    Status  Deferred      PT LONG TERM GOAL #3   Title  improve LE strength and endurance to walk for 15 minutes in the community without need to rest    Baseline  10 minutes    Time  8    Period  Weeks    Status  On-going      PT LONG TERM GOAL #4   Title  improve 6 minute walk test to ambulate > or = to 1000  feet     Baseline  860 feet- no rest breaks taken    Time  8    Period  Weeks    Status  On-going      PT LONG TERM GOAL #5   Title  demonstrate 4+/5 bil LE strength to improve safety and endurance on unlevel surfaces    Baseline  4+/5 bil LE strength in hips and knees.  DF 4+/5, PF on Rt 2/5, PF on Lt3+/5    Status  Achieved      PT LONG TERM GOAL #6   Title  improve LE strength to squat to a low cabinet with safety and demonstrate ability to rise to standing from this position    Baseline  Not yet able to do this    Time  8    Period  Weeks    Status  On-going             Plan - 05/08/18 1158    Clinical Impression Statement  Pt is making steady gains with balance and strength/endurance.  Pt with fatigue today so balance exercises were challenging today.  Pt reports improved stability at home with reduced use of cane.  Pt will continue to benefit from skilled PT for strength, endurance and balance exercises.      Rehab Potential  Good    PT Frequency  2x / week    PT Duration  8 weeks    PT Treatment/Interventions  ADLs/Self Care Home  Management;Cryotherapy;Moist Heat;Balance training;Therapeutic exercise;Functional mobility training;Therapeutic activities;Gait training;Stair training;Neuromuscular re-education;Patient/family education;Passive range of motion;Manual techniques    PT Next Visit Plan  LE strength, sit to stand, balance, gait with heel to toe progression.      Consulted and Agree with Plan of Care  Patient       Patient will benefit from skilled therapeutic intervention in order to improve the following deficits and impairments:  Abnormal gait, Difficulty walking, Decreased balance, Decreased activity tolerance, Decreased mobility, Decreased coordination, Postural dysfunction  Visit Diagnosis: Muscle weakness (generalized)  Other abnormalities of gait and mobility     Problem List Patient Active Problem List   Diagnosis Date Noted  . Gait disorder 04/20/2016  . Hyperglycemia 04/20/2016  . Abnormal CT scan, chest 06/03/2014  . Erectile dysfunction 06/03/2014  . Spinal stenosis of lumbar region 03/15/2014  . Cough 03/12/2013  . Allergic rhinitis 03/12/2013  . Leg pain, bilateral 12/30/2011  . Bladder neck obstruction 12/30/2011  . Preventative health care 12/25/2011  . Lumbar disc disease 12/25/2011  . S/P removal of thyroid nodule 12/25/2011  . History of MI (myocardial infarction) 12/25/2011  . Right knee meniscal tear 12/25/2011  . Degenerative arthritis of hip 12/25/2011  . HIP PAIN, RIGHT 05/14/2010  . FATIGUE 10/13/2009  . HYPERTENSION, BENIGN 02/25/2009  . CAD, AUTOLOGOUS BYPASS GRAFT 02/25/2009  . CORONARY ARTERY ANEURYSM 02/25/2009  . BACK PAIN 04/15/2008  . Hyperlipidemia 02/13/2008  . CORONARY ARTERY DISEASE 02/13/2008  . DIVERTICULOSIS, COLON 02/13/2008  . CARDIAC DISEASE, HX OF 02/13/2008  . COLONIC POLYPS, HX OF 02/13/2008     Sigurd Sos, PT 05/08/18 1:31 PM  [facility] [address], [place], [place], [address] Phone: [phone]   Fax:  [phone]  Name: Larry [name] MRN: [id number] Date of Birth: Jul 30, 1932

## 2018-05-11 ENCOUNTER — Ambulatory Visit: Payer: Medicare Other

## 2018-05-11 ENCOUNTER — Other Ambulatory Visit: Payer: Self-pay | Admitting: Cardiovascular Disease

## 2018-05-11 DIAGNOSIS — M6281 Muscle weakness (generalized): Secondary | ICD-10-CM | POA: Diagnosis not present

## 2018-05-11 DIAGNOSIS — R2689 Other abnormalities of gait and mobility: Secondary | ICD-10-CM

## 2018-05-11 NOTE — Therapy (Signed)
[facility] [address], [place], [place], [address] Phone: [phone]   Fax:  [phone]  Physical Therapy Treatment  Patient Details  Name: Larry [name] MRN: [id number] Date of Birth: [birth year] Referring Provider: Jovita Gamma, MD   Encounter Date: 05/11/2018  PT End of Session - 05/11/18 1137    Visit Number  25    Date for PT Re-Evaluation  05/16/18    Authorization Type  Medicare: KX     PT Start Time  1100    PT Stop Time  1133    PT Time Calculation (min)  33 min    Activity Tolerance  Patient limited by fatigue    Behavior During Therapy  [facility] for tasks assessed/performed       Past Medical History:  Diagnosis Date  . Allergic rhinitis, cause unspecified 03/12/2013  . Allergy   . CAD (coronary artery disease)    S/P CABG in 1999   . Cataract   . Decreased exercise tolerance    Exertional fatigue  . Degenerative arthritis of hip   . Diverticulosis of colon   . ED (erectile dysfunction)   . Erectile dysfunction 06/03/2014  . History of colonic polyps   . History of kidney stones   . History of MI (myocardial infarction)   . Hx of adenomatous colonic polyps 2000  . Hyperlipidemia    Low HDL  . Lumbar disc disease    Hx of with recent back pain and buttock pain  . Myocardial infarction ([place]) 1999  . Right knee meniscal tear   . S/P removal of thyroid nodule   . Spinal stenosis of lumbar region 03/15/2014    Past Surgical History:  Procedure Laterality Date  . CARDIAC CATHETERIZATION    . CATARACT EXTRACTION    . COLONOSCOPY    . CORONARY ARTERY BYPASS GRAFT  1999  . CYSTOSCOPY/URETEROSCOPY/HOLMIUM LASER/STENT PLACEMENT Left 12/23/2017   Procedure: CYSTOSCOPY/RETROGRADE/URETEROSCOPY/HOLMIUM LASER/STENT PLACEMENT;  Surgeon: Festus Aloe, MD;  Location: [facility];  Service: Urology;  Laterality: Left;  ONLY NEEDS 60 MIN  . LUMBAR DISC SURGERY    . Nodules on Thyroid    . POLYPECTOMY     . PTCA    . SHOULDER ARTHROSCOPY WITH ROTATOR CUFF REPAIR AND SUBACROMIAL DECOMPRESSION Right 01/30/2015   Procedure: RIGHT ARTHROSCOPY SHOULDER SUBACROMIAL DECOMPRESSION DISTAL CLAVICAL RESECTION RETATOR CUFF REPAIR;  Surgeon: Justice Britain, MD;  Location: [place];  Service: Orthopedics;  Laterality: Right;  . TONSILLECTOMY    . VASECTOMY      There were no vitals filed for this visit.  Subjective Assessment - 05/11/18 1102    Subjective  I am tired today.  I slept in.      Currently in Pain?  No/denies                       [facility] Adult PT Treatment/Exercise - 05/11/18 0001      Lumbar Exercises: Aerobic   Nustep  Level 2x 10 minutes PT present to discuss progress      Knee/Hip Exercises: Standing   Heel Raises  Both;2 sets;10 reps on mini tramp    Hip Abduction  Stengthening;Both;2 sets;10 reps    Abduction Limitations  4# added    Hip Extension  Stengthening;Both;2 sets;10 reps    Extension Limitations  4# added    Gait Training  --    Other Standing Knee Exercises  tandem line walk, high marching- PT with CGA on  gait belt    Other Standing Knee Exercises  --      Knee/Hip Exercises: Seated   Long Arc Quad  2 sets;15 reps;Limitations    Long Arc Quad Weight  4 lbs.               PT Short Term Goals - 03/21/18 1240      PT SHORT TERM GOAL #2   Title  improve LE strength and endurance to stand for 15-20 minutes without rest    Baseline  15 minutes for shaving and showering    Status  Achieved      PT SHORT TERM GOAL #3   Title  improve 6 minute walk test to ambulate 750 feet     Status  Achieved        PT Long Term Goals - 04/17/18 1155      PT LONG TERM GOAL #1   Title  be independent in advanced HEP    Time  8    Period  Weeks    Status  On-going      PT LONG TERM GOAL #2   Title  reduce FOTO to < or = to 41% limitation    Status  Deferred      PT LONG TERM GOAL #3   Title  improve LE strength and endurance to walk for 15 minutes  in the community without need to rest    Baseline  10 minutes    Time  8    Period  Weeks    Status  On-going      PT LONG TERM GOAL #4   Title  improve 6 minute walk test to ambulate > or = to 1000  feet     Baseline  860 feet- no rest breaks taken    Time  8    Period  Weeks    Status  On-going      PT LONG TERM GOAL #5   Title  demonstrate 4+/5 bil LE strength to improve safety and endurance on unlevel surfaces    Baseline  4+/5 bil LE strength in hips and knees.  DF 4+/5, PF on Rt 2/5, PF on Lt3+/5    Status  Achieved      PT LONG TERM GOAL #6   Title  improve LE strength to squat to a low cabinet with safety and demonstrate ability to rise to standing from this position    Baseline  Not yet able to do this    Time  8    Period  Weeks    Status  On-going            Plan - 05/11/18 1106    Clinical Impression Statement  Pt is making steady gains with balance and strength/endurance.  Pt demonstrates improved stability with standing exercises for balance.  Pt with some fatigue today and this limited tolerance for exercise.  Pt will continue to benefit from skilled PT 1 more session to finalize HEP for strength and endurance gains.      Rehab Potential  Good    PT Frequency  2x / week    PT Duration  8 weeks    PT Treatment/Interventions  ADLs/Self Care Home Management;Cryotherapy;Moist Heat;Balance training;Therapeutic exercise;Functional mobility training;Therapeutic activities;Gait training;Stair training;Neuromuscular re-education;Patient/family education;Passive range of motion;Manual techniques    PT Next Visit Plan  LE strength, sit to stand, balance, gait with heel to toe progression.     Consulted and Agree with Plan of  Care  Patient       Patient will benefit from skilled therapeutic intervention in order to improve the following deficits and impairments:  Abnormal gait, Difficulty walking, Decreased balance, Decreased activity tolerance, Decreased mobility,  Decreased coordination, Postural dysfunction  Visit Diagnosis: Muscle weakness (generalized)  Other abnormalities of gait and mobility     Problem List Patient Active Problem List   Diagnosis Date Noted  . Gait disorder 04/20/2016  . Hyperglycemia 04/20/2016  . Abnormal CT scan, chest 06/03/2014  . Erectile dysfunction 06/03/2014  . Spinal stenosis of lumbar region 03/15/2014  . Cough 03/12/2013  . Allergic rhinitis 03/12/2013  . Leg pain, bilateral 12/30/2011  . Bladder neck obstruction 12/30/2011  . Preventative health care 12/25/2011  . Lumbar disc disease 12/25/2011  . S/P removal of thyroid nodule 12/25/2011  . History of MI (myocardial infarction) 12/25/2011  . Right knee meniscal tear 12/25/2011  . Degenerative arthritis of hip 12/25/2011  . HIP PAIN, RIGHT 05/14/2010  . FATIGUE 10/13/2009  . HYPERTENSION, BENIGN 02/25/2009  . CAD, AUTOLOGOUS BYPASS GRAFT 02/25/2009  . CORONARY ARTERY ANEURYSM 02/25/2009  . BACK PAIN 04/15/2008  . Hyperlipidemia 02/13/2008  . CORONARY ARTERY DISEASE 02/13/2008  . DIVERTICULOSIS, COLON 02/13/2008  . CARDIAC DISEASE, HX OF 02/13/2008  . COLONIC POLYPS, HX OF 02/13/2008    Sigurd Sos, PT 05/11/18 11:38 AM  [facility] [address] [place], [place], [address] Phone: [phone]   Fax:  [phone]  Name: Larry [name] MRN: [id number] Date of Birth: 08-10-1932

## 2018-05-16 ENCOUNTER — Ambulatory Visit: Payer: Medicare Other

## 2018-05-16 DIAGNOSIS — M6281 Muscle weakness (generalized): Secondary | ICD-10-CM

## 2018-05-16 DIAGNOSIS — R2689 Other abnormalities of gait and mobility: Secondary | ICD-10-CM

## 2018-05-16 NOTE — Therapy (Signed)
[facility] [address] [place], [place], [address] Phone: [phone]   Fax:  [phone]  Physical Therapy Treatment  Patient Details  Name: Larry [name] MRN: [id number] Date of Birth: 1932-03-07 Referring Provider: Jovita Gamma, MD   Encounter Date: 05/16/2018  PT End of Session - 05/16/18 1217    Visit Number  26    Date for PT Re-Evaluation  05/16/18    Authorization Type  Medicare: KX     PT Start Time  1150    PT Stop Time  1222    PT Time Calculation (min)  32 min    Activity Tolerance  Patient limited by fatigue    Behavior During Therapy  [facility] for tasks assessed/performed       Past Medical History:  Diagnosis Date  . Allergic rhinitis, cause unspecified 03/12/2013  . Allergy   . CAD (coronary artery disease)    S/P CABG in 1999   . Cataract   . Decreased exercise tolerance    Exertional fatigue  . Degenerative arthritis of hip   . Diverticulosis of colon   . ED (erectile dysfunction)   . Erectile dysfunction 06/03/2014  . History of colonic polyps   . History of kidney stones   . History of MI (myocardial infarction)   . Hx of adenomatous colonic polyps 2000  . Hyperlipidemia    Low HDL  . Lumbar disc disease    Hx of with recent back pain and buttock pain  . Myocardial infarction ([place]) 1999  . Right knee meniscal tear   . S/P removal of thyroid nodule   . Spinal stenosis of lumbar region 03/15/2014    Past Surgical History:  Procedure Laterality Date  . CARDIAC CATHETERIZATION    . CATARACT EXTRACTION    . COLONOSCOPY    . CORONARY ARTERY BYPASS GRAFT  1999  . CYSTOSCOPY/URETEROSCOPY/HOLMIUM LASER/STENT PLACEMENT Left 12/23/2017   Procedure: CYSTOSCOPY/RETROGRADE/URETEROSCOPY/HOLMIUM LASER/STENT PLACEMENT;  Surgeon: Festus Aloe, MD;  [facility];  Service: Urology;  Laterality: Left;  ONLY NEEDS 60 MIN  . LUMBAR DISC SURGERY    . Nodules on Thyroid    . POLYPECTOMY     . PTCA    . SHOULDER ARTHROSCOPY WITH ROTATOR CUFF REPAIR AND SUBACROMIAL DECOMPRESSION Right 01/30/2015   Procedure: RIGHT ARTHROSCOPY SHOULDER SUBACROMIAL DECOMPRESSION DISTAL CLAVICAL RESECTION RETATOR CUFF REPAIR;  Surgeon: Justice Britain, MD;  Location: [place];  Service: Orthopedics;  Laterality: Right;  . TONSILLECTOMY    . VASECTOMY      There were no vitals filed for this visit.  Subjective Assessment - 05/16/18 1150    Subjective  I am ready to D/C to HEP    Patient Stated Goals  improve endurance for standing and walking    Currently in Pain?  No/denies         [facility] PT Assessment - 05/16/18 0001      Assessment   Medical Diagnosis  spinal stenosis, lumbar region with neurogenic claudication      Cognition   Overall Cognitive Status  Within Functional Limits for tasks assessed      Strength   Right/Left Hip  Right;Left    Right Hip Flexion  4+/5    Left Hip Flexion  4+/5    Right Knee Flexion  4+/5    Right Knee Extension  4+/5    Left Knee Flexion  4+/5    Left Knee Extension  5/5    Right Ankle Dorsiflexion  4+/5    Left Ankle Dorsiflexion  4+/5      6 minute walk test results    Aerobic Endurance Distance Walked  760    Endurance additional comments  no breaks taken.  Fatigue and some instability in the last 2 laps                   [facility] Adult PT Treatment/Exercise - 05/16/18 0001      Lumbar Exercises: Aerobic   Nustep  Level 2x 10 minutes PT present to discuss progress      Knee/Hip Exercises: Standing   Heel Raises  Both;2 sets;10 reps on mini tramp    Hip Abduction  Stengthening;Both;2 sets;10 reps    Abduction Limitations  4# added    Hip Extension  Stengthening;Both;2 sets;10 reps    Extension Limitations  4# added    Gait Training  tandem stance 4x20 seconds with min UE support      Knee/Hip Exercises: Seated   Long Arc Quad  2 sets;15 reps;Limitations    Long Arc Quad Weight  4 lbs.               PT Short Term Goals -  03/21/18 1240      PT SHORT TERM GOAL #2   Title  improve LE strength and endurance to stand for 15-20 minutes without rest    Baseline  15 minutes for shaving and showering    Status  Achieved      PT SHORT TERM GOAL #3   Title  improve 6 minute walk test to ambulate 750 feet     Status  Achieved        PT Long Term Goals - 05/16/18 1150      PT LONG TERM GOAL #1   Title  be independent in advanced HEP    Status  Achieved      PT LONG TERM GOAL #2   Title  reduce FOTO to < or = to 41% limitation    Status  Deferred      PT LONG TERM GOAL #3   Title  improve LE strength and endurance to walk for 15 minutes in the community without need to rest    Baseline  15 minutes    Status  Achieved      PT LONG TERM GOAL #4   Title  improve 6 minute walk test to ambulate > or = to 1000  feet     Baseline  760 feet    Status  Partially Met      PT LONG TERM GOAL #5   Title  demonstrate 4+/5 bil LE strength to improve safety and endurance on unlevel surfaces    Status  Achieved      PT LONG TERM GOAL #6   Title  improve LE strength to squat to a low cabinet with safety and demonstrate ability to rise to standing from this position    Baseline  Not yet able to do this    Status  Not Met            Plan - 05/16/18 1205    Clinical Impression Statement  Pt will D/C to HEP today. 6 minute walk test was 760 feet with fatigue and instability at the end.  Pt has HEP in place for LE strength, endurance and balance to improve safety.  Pt will D/C to HEP today and follow-up with MD as needed.      PT  Next Visit Plan  D/C PT to HEP    Consulted and Agree with Plan of Care  Patient       Patient will benefit from skilled therapeutic intervention in order to improve the following deficits and impairments:     Visit Diagnosis: Muscle weakness (generalized)  Other abnormalities of gait and mobility     Problem List Patient Active Problem List   Diagnosis Date Noted  . Gait  disorder 04/20/2016  . Hyperglycemia 04/20/2016  . Abnormal CT scan, chest 06/03/2014  . Erectile dysfunction 06/03/2014  . Spinal stenosis of lumbar region 03/15/2014  . Cough 03/12/2013  . Allergic rhinitis 03/12/2013  . Leg pain, bilateral 12/30/2011  . Bladder neck obstruction 12/30/2011  . Preventative health care 12/25/2011  . Lumbar disc disease 12/25/2011  . S/P removal of thyroid nodule 12/25/2011  . History of MI (myocardial infarction) 12/25/2011  . Right knee meniscal tear 12/25/2011  . Degenerative arthritis of hip 12/25/2011  . HIP PAIN, RIGHT 05/14/2010  . FATIGUE 10/13/2009  . HYPERTENSION, BENIGN 02/25/2009  . CAD, AUTOLOGOUS BYPASS GRAFT 02/25/2009  . CORONARY ARTERY ANEURYSM 02/25/2009  . BACK PAIN 04/15/2008  . Hyperlipidemia 02/13/2008  . CORONARY ARTERY DISEASE 02/13/2008  . DIVERTICULOSIS, COLON 02/13/2008  . CARDIAC DISEASE, HX OF 02/13/2008  . COLONIC POLYPS, HX OF 02/13/2008   PHYSICAL THERAPY DISCHARGE SUMMARY  Visits from Start of Care: 26  Current functional level related to goals / functional outcomes: See above for current status.  Pt has HEP in place for continued expected gains.     Remaining deficits: Pt with chronic balance and endurance deficits.  Pt will continue with HEP for continued gains.     Education / Equipment: HEP Plan: Patient agrees to discharge.  Patient goals were partially met. Patient is being discharged due to being pleased with the current functional level.  ?????        Sigurd Sos, PT 05/16/18 12:25 PM  [facility] [address] [place], [place], [address] Phone: [phone]   Fax:  [phone]  Name: Larry [name] MRN: [id number] Date of Birth: [birth year]

## 2018-05-20 ENCOUNTER — Other Ambulatory Visit: Payer: Self-pay | Admitting: Cardiovascular Disease

## 2018-05-23 ENCOUNTER — Other Ambulatory Visit: Payer: Medicare Other | Admitting: *Deleted

## 2018-05-23 DIAGNOSIS — E785 Hyperlipidemia, unspecified: Secondary | ICD-10-CM | POA: Diagnosis not present

## 2018-05-23 LAB — LIPID PANEL
Chol/HDL Ratio: 3.6 ratio (ref 0.0–5.0)
Cholesterol, Total: 121 mg/dL (ref 100–199)
HDL: 34 mg/dL — ABNORMAL LOW (ref >39)
LDL Calculated: 61 mg/dL (ref 0–99)
Triglycerides: 131 mg/dL (ref 0–149)
VLDL CHOLESTEROL CAL: 26 mg/dL (ref 5–40)

## 2018-05-23 LAB — HEPATIC FUNCTION PANEL
ALK PHOS: 42 IU/L (ref 39–117)
ALT: 15 IU/L (ref 0–44)
AST: 17 IU/L (ref 0–40)
Albumin: 4.3 g/dL (ref 3.5–4.7)
Bilirubin Total: 0.8 mg/dL (ref 0.0–1.2)
Bilirubin, Direct: 0.19 mg/dL (ref 0.00–0.40)
TOTAL PROTEIN: 6.9 g/dL (ref 6.0–8.5)

## 2018-05-28 NOTE — Progress Notes (Signed)
Chief Complaint  Patient presents with  . Follow-up    CAD   History of Present Illness: 84yo male with history of CAD s/p 3V CABG in 1999, HLD and PVCs here for cardiac follow up. He had 3V CABG in 1999 at [facility] in [place]. In 2008 he had a catheterization and was found to have a patent LIMA to LAD and a patent vein graft to the right coronary and no significant obstruction in the circumflex artery. Exercise stress test April 2015 with no ischemia. He has had PVCs noted on cardiac monitor and was started on Cardizem but he did not tolerate the Cardizem due to dizziness so it was stopped.   He is here today for follow up. The patient denies any chest pain, dyspnea, palpitations, lower extremity edema, orthopnea, PND, dizziness, near syncope or syncope. He has chronic back pain and is considering a back surgery soon. He had kidney stones in December 2018.   Primary Care Physician: Biagio Borg, MD   Past Medical History:  Diagnosis Date  . Allergic rhinitis, cause unspecified 03/12/2013  . Allergy   . CAD (coronary artery disease)    S/P CABG in 1999   . Cataract   . Decreased exercise tolerance    Exertional fatigue  . Degenerative arthritis of hip   . Diverticulosis of colon   . ED (erectile dysfunction)   . Erectile dysfunction 06/03/2014  . History of colonic polyps   . History of kidney stones   . History of MI (myocardial infarction)   . Hx of adenomatous colonic polyps 2000  . Hyperlipidemia    Low HDL  . Lumbar disc disease    Hx of with recent back pain and buttock pain  . Myocardial infarction ([place]) 1999  . Right knee meniscal tear   . S/P removal of thyroid nodule   . Spinal stenosis of lumbar region 03/15/2014    Past Surgical History:  Procedure Laterality Date  . CARDIAC CATHETERIZATION    . CATARACT EXTRACTION    . COLONOSCOPY    . CORONARY ARTERY BYPASS GRAFT  1999  . CYSTOSCOPY/URETEROSCOPY/HOLMIUM LASER/STENT PLACEMENT Left 12/23/2017   Procedure: CYSTOSCOPY/RETROGRADE/URETEROSCOPY/HOLMIUM LASER/STENT PLACEMENT;  Surgeon: Festus Aloe, MD;  [facility];  Service: Urology;  Laterality: Left;  ONLY NEEDS 60 MIN  . LUMBAR DISC SURGERY    . Nodules on Thyroid    . POLYPECTOMY    . PTCA    . SHOULDER ARTHROSCOPY WITH ROTATOR CUFF REPAIR AND SUBACROMIAL DECOMPRESSION Right 01/30/2015   Procedure: RIGHT ARTHROSCOPY SHOULDER SUBACROMIAL DECOMPRESSION DISTAL CLAVICAL RESECTION RETATOR CUFF REPAIR;  Surgeon: Justice Britain, MD;  Location: [place];  Service: Orthopedics;  Laterality: Right;  . TONSILLECTOMY    . VASECTOMY      Current Outpatient Medications  Medication Sig Dispense Refill  . acetaminophen (TYLENOL) 650 MG CR tablet Take 650 mg by mouth every 8 (eight) hours as needed for pain.     Marland Kitchen aspirin 81 MG EC tablet Take 81 mg by mouth at bedtime.     . calcium-vitamin D (OSCAL WITH D) 500-200 MG-UNIT per tablet Take 1 tablet by mouth 2 (two) times daily.     . cetirizine (ZYRTEC) 10 MG tablet Take 10 mg by mouth daily.    . clopidogrel (PLAVIX) 75 MG tablet TAKE 1 TABLET (75 MG TOTAL) BY MOUTH DAILY. PLEASE KEEP UPCOMING APPT FOR FUTURE REFILLS. THANK YOU 90 tablet 0  . fish oil-omega-3 fatty acids 1000 MG  capsule Take 1 g by mouth 2 (two) times daily.     . folic acid (FOLVITE) 093 MCG tablet Take 400 mcg by mouth daily.      . Multiple Vitamin (MULTIVITAMIN WITH MINERALS) TABS tablet Take 1 tablet by mouth daily. Centrum Silver    . rosuvastatin (CRESTOR) 5 MG tablet TAKE 1 TABLET DAILY ALTERNATING WITH 2 TABLETS DAILY. PLEASE KEEP UPCOMING APPT FOR FUTURE REFILLS. 30 tablet 0  . tamsulosin (FLOMAX) 0.4 MG CAPS capsule Take 1 capsule (0.4 mg total) by mouth daily. 30 capsule 5   No current facility-administered medications for this visit.     Allergies  Allergen Reactions  . Beta Adrenergic Blockers Other (See Comments)    Dropped blood pressure too low  . Simvastatin Other (See Comments)    myalgia    Social  History   Socioeconomic History  . Marital status: Married    Spouse name: Not on file  . Number of children: 2  . Years of education: Not on file  . Highest education level: Not on file  Occupational History  . 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 Financial resource strain: Not on file  . Food insecurity:    Worry: Not on file    Inability: Not on file  . Transportation needs:    Medical: Not on file    Non-medical: Not on file  Tobacco Use  . Smoking status: Never Smoker  . Smokeless tobacco: Never Used  Substance and Sexual Activity  . Alcohol use: Yes    Alcohol/week: 0.0 oz    Comment: rare  . Drug use: No  . Sexual activity: Not on file  Lifestyle  . Physical activity:    Days per week: Not on file    Minutes per session: Not on file  . Stress: Not on file  Relationships  . Social connections:    Talks on phone: Not on file    Gets together: Not on file    Attends religious service: Not on file    Active member of club or organization: Not on file    Attends meetings of clubs or organizations: Not on file    Relationship status: Not on file  . Intimate partner violence:    Fear of current or ex partner: Not on file    Emotionally abused: Not on file    Physically abused: Not on file    Forced sexual activity: Not on file  Other Topics Concern  . Not on file  Social History Narrative   Moved from [facility]   Married      [employer] on file    Family History  Problem Relation Age of Onset  . Prostate cancer Other   . Prostate cancer Other   . Heart disease Mother   . Heart disease Brother   . Colon cancer Neg Hx   . Rectal cancer Neg Hx   . Stomach cancer Neg Hx     Review of Systems:  As stated in the HPI and otherwise negative.   BP 126/80   Pulse 93   Ht 5\' 7"  (1.702 m)   Wt 180 lb 3.2 oz (81.7 kg)   SpO2 93%   BMI 28.22 kg/m   Physical Examination:  General: Well developed,  well nourished, NAD  HEENT: OP clear, mucus membranes moist  SKIN: warm, dry. No rashes. Neuro: No focal deficits  Musculoskeletal: Muscle strength 5/5  all ext  Psychiatric: Mood and affect normal  Neck: No JVD, no carotid bruits, no thyromegaly, no lymphadenopathy.  Lungs:Clear bilaterally, no wheezes, rhonci, crackles Cardiovascular: Regular rate and rhythm. No murmurs, gallops or rubs. Abdomen:Soft. Bowel sounds present. Non-tender.  Extremities: No lower extremity edema. Pulses are 2 + in the bilateral DP/PT.  Echo October 2013: Left ventricle: The cavity size was mildly dilated. Wall thickness was increased in a pattern of mild LVH. Systolic function was normal. The estimated ejection fraction was in the range of 50% to 55%. Wall motion was normal; there were no regional wall motion abnormalities. - Aortic valve: Trivial regurgitation. - Mitral valve: Mild regurgitation. - Left atrium: The atrium was mildly dilated. - Atrial septum: No defect or patent foramen ovale was Identified.  EKG:  EKG is not ordered today. The ekg ordered today demonstrates    Recent Labs: 12/19/2017: BUN 18; Creatinine, Ser 1.00; Hemoglobin 16.0; Platelets 222; Potassium 3.7; Sodium 140 05/23/2018: ALT 15   Lipid Panel    Component Value Date/Time   CHOL 121 05/23/2018 0939   CHOL 118 04/03/2014 0843   TRIG 131 05/23/2018 0939   TRIG 162 (H) 04/03/2014 0843   HDL 34 (L) 05/23/2018 0939   HDL 36 (L) 04/03/2014 0843   CHOLHDL 3.6 05/23/2018 0939   CHOLHDL 4 04/21/2016 0910   VLDL 28.2 04/21/2016 0910   LDLCALC 61 05/23/2018 0939   LDLCALC 50 04/03/2014 0843   LDLDIRECT 91.5 02/13/2008 1400     Wt Readings from Last 3 Encounters:  05/29/18 180 lb 3.2 oz (81.7 kg)  12/23/17 178 lb (80.7 kg)  12/19/17 178 lb (80.7 kg)     Other studies Reviewed: Additional studies/ records that were reviewed today include: . Review of the above records demonstrates:    Assessment and Plan:   1. CAD  s/p CABG without angina: No chest pain. Will continue ASA, Plavix and statin.      2. PVCs: He has no palpitations. He tried Cardizem in the past but did not tolerate.   3. HLD: LDL is at goal in June 2019. Continue statin.   He does not wish to schedule a stress test at this time since he may not proceed with the back surgery. He will call with changes.   Current medicines are reviewed at length with the patient today.  The patient does not have concerns regarding medicines.  The following changes have been made:  no change  Labs/ tests ordered today include:   No orders of the defined types were placed in this encounter.   Disposition:   FU with me in 6 months  Signed, Lauree Chandler, MD 05/29/2018 11:15 AM    [facility] [place], [place],   [address] Phone: [phone]; Fax: [phone]

## 2018-05-29 ENCOUNTER — Encounter: Payer: Self-pay | Admitting: Cardiovascular Disease

## 2018-05-29 ENCOUNTER — Ambulatory Visit (INDEPENDENT_AMBULATORY_CARE_PROVIDER_SITE_OTHER): Payer: Medicare Other | Admitting: Cardiovascular Disease

## 2018-05-29 VITALS — BP 126/80 | HR 93 | Ht 67.0 in | Wt 180.2 lb

## 2018-05-29 DIAGNOSIS — E7800 Pure hypercholesterolemia, unspecified: Secondary | ICD-10-CM | POA: Diagnosis not present

## 2018-05-29 DIAGNOSIS — I25810 Atherosclerosis of coronary artery bypass graft(s) without angina pectoris: Secondary | ICD-10-CM

## 2018-05-29 DIAGNOSIS — I493 Ventricular premature depolarization: Secondary | ICD-10-CM

## 2018-05-29 NOTE — Patient Instructions (Signed)
Your physician recommends that you continue on your current medications as directed. Please refer to the Current Medication list given to you today. Your physician wants you to follow-up in: 6 months with Dr. McAlhany.  You will receive a reminder letter in the mail two months in advance. If you don't receive a letter, please call our office to schedule the follow-up appointment.  

## 2018-06-09 ENCOUNTER — Other Ambulatory Visit: Payer: Self-pay | Admitting: Cardiovascular Disease

## 2018-08-01 DIAGNOSIS — M48062 Spinal stenosis, lumbar region with neurogenic claudication: Secondary | ICD-10-CM | POA: Diagnosis not present

## 2018-08-01 DIAGNOSIS — R030 Elevated blood-pressure reading, without diagnosis of hypertension: Secondary | ICD-10-CM | POA: Diagnosis not present

## 2018-08-01 DIAGNOSIS — M4316 Spondylolisthesis, lumbar region: Secondary | ICD-10-CM | POA: Diagnosis not present

## 2018-08-01 DIAGNOSIS — M5136 Other intervertebral disc degeneration, lumbar region: Secondary | ICD-10-CM | POA: Diagnosis not present

## 2018-08-01 DIAGNOSIS — M47816 Spondylosis without myelopathy or radiculopathy, lumbar region: Secondary | ICD-10-CM | POA: Diagnosis not present

## 2018-08-01 DIAGNOSIS — M4155 Other secondary scoliosis, thoracolumbar region: Secondary | ICD-10-CM | POA: Diagnosis not present

## 2018-08-01 DIAGNOSIS — Z6828 Body mass index (BMI) 28.0-28.9, adult: Secondary | ICD-10-CM | POA: Diagnosis not present

## 2018-08-09 DIAGNOSIS — R351 Nocturia: Secondary | ICD-10-CM | POA: Diagnosis not present

## 2018-08-09 DIAGNOSIS — N401 Enlarged prostate with lower urinary tract symptoms: Secondary | ICD-10-CM | POA: Diagnosis not present

## 2018-08-09 DIAGNOSIS — N281 Cyst of kidney, acquired: Secondary | ICD-10-CM | POA: Diagnosis not present

## 2018-08-19 ENCOUNTER — Other Ambulatory Visit: Payer: Self-pay | Admitting: Cardiovascular Disease

## 2018-08-22 ENCOUNTER — Other Ambulatory Visit: Payer: Self-pay | Admitting: Internal Medicine

## 2018-09-01 ENCOUNTER — Encounter: Payer: Self-pay | Admitting: Internal Medicine

## 2018-09-01 ENCOUNTER — Ambulatory Visit (INDEPENDENT_AMBULATORY_CARE_PROVIDER_SITE_OTHER): Payer: Medicare Other | Admitting: Internal Medicine

## 2018-09-01 VITALS — BP 106/62 | HR 90 | Temp 97.8°F | Ht 67.0 in | Wt 181.0 lb

## 2018-09-01 DIAGNOSIS — Z23 Encounter for immunization: Secondary | ICD-10-CM | POA: Diagnosis not present

## 2018-09-01 DIAGNOSIS — R739 Hyperglycemia, unspecified: Secondary | ICD-10-CM | POA: Diagnosis not present

## 2018-09-01 DIAGNOSIS — E7800 Pure hypercholesterolemia, unspecified: Secondary | ICD-10-CM | POA: Diagnosis not present

## 2018-09-01 DIAGNOSIS — I25810 Atherosclerosis of coronary artery bypass graft(s) without angina pectoris: Secondary | ICD-10-CM

## 2018-09-01 DIAGNOSIS — I10 Essential (primary) hypertension: Secondary | ICD-10-CM

## 2018-09-01 NOTE — Patient Instructions (Addendum)
You had the flu shot today  Please continue all other medications as before, and refills have been done if requested.  Please have the pharmacy call with any other refills you may need.  Please continue your efforts at being more active, low cholesterol diet, and weight control.  You are otherwise up to date with prevention measures today.  Please keep your appointments with your specialists as you may have planned  Please go to the LAB in the Basement (turn left off the elevator) for the tests to be done at your convenience  You will be contacted by phone if any changes need to be made immediately.  Otherwise, you will receive a letter about your results with an explanation, but please check with MyChart first.  Please remember to sign up for MyChart if you have not done so, as this will be important to you in the future with finding out test results, communicating by private email, and scheduling acute appointments online when needed.  Please return in 1 year for your yearly visit, or sooner if needed

## 2018-09-01 NOTE — Progress Notes (Signed)
Subjective:    Patient ID: Larry [name], male    DOB: [birth year], 84 y.o.   MRN: [id number]  HPI  Here for yearly f/u with wife;  Overall doing ok;  Pt denies Chest pain, worsening SOB, DOE, wheezing, orthopnea, PND, worsening LE edema, palpitations, dizziness or syncope.  Pt denies neurological change such as new headache, facial or extremity weakness.  Pt denies polydipsia, polyuria, or low sugar symptoms. Pt states overall good compliance with treatment and medications, good tolerability, and has been trying to follow appropriate diet.  Pt denies worsening depressive symptoms, suicidal ideation or panic. No fever, night sweats, wt loss, loss of appetite, or other constitutional symptoms.  Pt states good ability with ADL's, has low fall risk, home safety reviewed and adequate, no other significant changes in hearing or vision, and only occasionally active with exercise. No new complaints Wt Readings from Last 3 Encounters:  09/01/18 181 lb (82.1 kg)  05/29/18 180 lb 3.2 oz (81.7 kg)  12/23/17 178 lb (80.7 kg)  Pt continues to have recurring LBP without change in severity, bowel or bladder change, fever, wt loss,  worsening LE pain/numbness/weakness, gait change or falls., iwht hx of lumbar DDD and herniation followed by Dr Sherwood Gambler;   Past Medical History:  Diagnosis Date  . Allergic rhinitis, cause unspecified 03/12/2013  . Allergy   . CAD (coronary artery disease)    S/P CABG in 1999   . Cataract   . Decreased exercise tolerance    Exertional fatigue  . Degenerative arthritis of hip   . Diverticulosis of colon   . ED (erectile dysfunction)   . Erectile dysfunction 06/03/2014  . History of colonic polyps   . History of kidney stones   . History of MI (myocardial infarction)   . Hx of adenomatous colonic polyps 2000  . Hyperlipidemia    Low HDL  . Lumbar disc disease    Hx of with recent back pain and buttock pain  . Myocardial infarction ([place]) 1999  . Right knee meniscal tear   .  S/P removal of thyroid nodule   . Spinal stenosis of lumbar region 03/15/2014   Past Surgical History:  Procedure Laterality Date  . CARDIAC CATHETERIZATION    . CATARACT EXTRACTION    . COLONOSCOPY    . CORONARY ARTERY BYPASS GRAFT  1999  . CYSTOSCOPY/URETEROSCOPY/HOLMIUM LASER/STENT PLACEMENT Left 12/23/2017   Procedure: CYSTOSCOPY/RETROGRADE/URETEROSCOPY/HOLMIUM LASER/STENT PLACEMENT;  Surgeon: Festus Aloe, MD;  Location: [facility];  Service: Urology;  Laterality: Left;  ONLY NEEDS 60 MIN  . LUMBAR DISC SURGERY    . Nodules on Thyroid    . POLYPECTOMY    . PTCA    . SHOULDER ARTHROSCOPY WITH ROTATOR CUFF REPAIR AND SUBACROMIAL DECOMPRESSION Right 01/30/2015   Procedure: RIGHT ARTHROSCOPY SHOULDER SUBACROMIAL DECOMPRESSION DISTAL CLAVICAL RESECTION RETATOR CUFF REPAIR;  Surgeon: Justice Britain, MD;  Location: [facility];  Service: Orthopedics;  Laterality: Right;  . TONSILLECTOMY    . VASECTOMY      reports that he has never smoked. He has never used smokeless tobacco. He reports that he drinks alcohol. He reports that he does not use drugs. family history includes Heart disease in his brother and mother; Prostate cancer in his other and other. Allergies  Allergen Reactions  . Beta Adrenergic Blockers Other (See Comments)    Dropped blood pressure too low  . Simvastatin Other (See Comments)    myalgia   Current Outpatient Medications on File Prior to Visit  Medication Sig  Dispense Refill  . acetaminophen (TYLENOL) 650 MG CR tablet Take 650 mg by mouth every 8 (eight) hours as needed for pain.     Marland Kitchen aspirin 81 MG EC tablet Take 81 mg by mouth at bedtime.     . calcium-vitamin D (OSCAL WITH D) 500-200 MG-UNIT per tablet Take 1 tablet by mouth 2 (two) times daily.     . cetirizine (ZYRTEC) 10 MG tablet Take 10 mg by mouth daily.    . clopidogrel (PLAVIX) 75 MG tablet Take 1 tablet (75 mg total) by mouth daily. 90 tablet 3  . fish oil-omega-3 fatty acids 1000 MG capsule Take 1 g by mouth 2  (two) times daily.     . folic acid (FOLVITE) 423 MCG tablet Take 400 mcg by mouth daily.      . Multiple Vitamin (MULTIVITAMIN WITH MINERALS) TABS tablet Take 1 tablet by mouth daily. Centrum Silver    . rosuvastatin (CRESTOR) 5 MG tablet TAKE 1 TABLET DAILY ALTERNATING WITH 2 TABLETS DAILY. 135 tablet 3  . tamsulosin (FLOMAX) 0.4 MG CAPS capsule TAKE 1 CAPSULE BY MOUTH EVERY DAY 90 capsule 0   No current facility-administered medications on file prior to visit.    Review of Systems Constitutional: Negative for other unusual diaphoresis, sweats, appetite or weight changes HENT: Negative for other worsening hearing loss, ear pain, facial swelling, mouth sores or neck stiffness.   Eyes: Negative for other worsening pain, redness or other visual disturbance.  Respiratory: Negative for other stridor or swelling Cardiovascular: Negative for other palpitations or other chest pain  Gastrointestinal: Negative for worsening diarrhea or loose stools, blood in stool, distention or other pain Genitourinary: Negative for hematuria, flank pain or other change in urine volume.  Musculoskeletal: Negative for myalgias or other joint swelling.  Skin: Negative for other color change, or other wound or worsening drainage.  Neurological: Negative for other syncope or numbness. Hematological: Negative for other adenopathy or swelling Psychiatric/Behavioral: Negative for hallucinations, other worsening agitation, SI, self-injury, or new decreased concentration All other system neg per pt    Objective:   Physical Exam BP 106/62   Pulse 90   Temp 97.8 F (36.6 C) (Oral)   Ht 5\' 7"  (1.702 m)   Wt 181 lb (82.1 kg)   SpO2 95%   BMI 28.35 kg/m  VS noted,  Constitutional: Pt appears in NAD HENT: Head: NCAT.  Right Ear: External ear normal.  Left Ear: External ear normal.  Eyes: . Pupils are equal, round, and reactive to light. Conjunctivae and EOM are normal Nose: without d/c or deformity Neck: Neck  supple. Gross normal ROM Cardiovascular: Normal rate and regular rhythm.   Pulmonary/Chest: Effort normal and breath sounds without rales or wheezing.  Abd:  Soft, NT, ND, + BS, no organomegaly Neurological: Pt is alert. At baseline orientation, motor grossly intact Skin: Skin is warm. No rashes, other new lesions, no LE edema Psychiatric: Pt behavior is normal without agitation  No other exam findings Lab Results  Component Value Date   WBC 8.4 12/19/2017   HGB 16.0 12/19/2017   HCT 46.7 12/19/2017   PLT 222 12/19/2017   GLUCOSE 120 (H) 12/19/2017   CHOL 121 05/23/2018   TRIG 131 05/23/2018   HDL 34 (L) 05/23/2018   LDLDIRECT 91.5 02/13/2008   LDLCALC 61 05/23/2018   ALT 15 05/23/2018   AST 17 05/23/2018   NA 140 12/19/2017   K 3.7 12/19/2017   CL 107 12/19/2017   CREATININE 1.00  12/19/2017   BUN 18 12/19/2017   CO2 26 12/19/2017   TSH 1.81 04/21/2016   PSA 1.40 03/20/2015   INR 0.99 12/19/2017   HGBA1C 5.8 04/21/2016       Assessment & Plan:

## 2018-09-02 NOTE — Assessment & Plan Note (Signed)
stable overall by history and exam, recent data reviewed with pt, and pt to continue medical treatment as before,  to f/u any worsening symptoms or concerns Lab Results  Component Value Date   LDLCALC 61 05/23/2018

## 2018-09-02 NOTE — Assessment & Plan Note (Signed)
stable overall by history and exam, recent data reviewed with pt, and pt to continue medical treatment as before,  to f/u any worsening symptoms or concerns  

## 2018-09-02 NOTE — Assessment & Plan Note (Signed)
stable overall by history and exam, recent data reviewed with pt, and pt to continue medical treatment as before,  to f/u any worsening symptoms or concerns Lab Results  Component Value Date   HGBA1C 5.8 04/21/2016

## 2018-09-04 ENCOUNTER — Other Ambulatory Visit (INDEPENDENT_AMBULATORY_CARE_PROVIDER_SITE_OTHER): Payer: Medicare Other

## 2018-09-04 DIAGNOSIS — E7800 Pure hypercholesterolemia, unspecified: Secondary | ICD-10-CM | POA: Diagnosis not present

## 2018-09-04 DIAGNOSIS — R739 Hyperglycemia, unspecified: Secondary | ICD-10-CM

## 2018-09-04 LAB — HEPATIC FUNCTION PANEL
ALT: 14 U/L (ref 0–53)
AST: 15 U/L (ref 0–37)
Albumin: 4.2 g/dL (ref 3.5–5.2)
Alkaline Phosphatase: 38 U/L — ABNORMAL LOW (ref 39–117)
BILIRUBIN TOTAL: 1 mg/dL (ref 0.2–1.2)
Bilirubin, Direct: 0.2 mg/dL (ref 0.0–0.3)
Total Protein: 7.3 g/dL (ref 6.0–8.3)

## 2018-09-04 LAB — BASIC METABOLIC PANEL
BUN: 23 mg/dL (ref 6–23)
CO2: 28 meq/L (ref 19–32)
Calcium: 9.3 mg/dL (ref 8.4–10.5)
Chloride: 105 mEq/L (ref 96–112)
Creatinine, Ser: 1.17 mg/dL (ref 0.40–1.50)
GFR: 63.16 mL/min (ref >60.00)
GLUCOSE: 103 mg/dL — AB (ref 70–99)
POTASSIUM: 4 meq/L (ref 3.5–5.1)
SODIUM: 140 meq/L (ref 135–145)

## 2018-09-04 LAB — URINALYSIS, ROUTINE W REFLEX MICROSCOPIC
Bilirubin Urine: NEGATIVE
HGB URINE DIPSTICK: NEGATIVE
Ketones, ur: NEGATIVE
Leukocytes, UA: NEGATIVE
Nitrite: NEGATIVE
PH: 6 (ref 5.0–8.0)
RBC / HPF: NONE SEEN (ref 0–?)
SPECIFIC GRAVITY, URINE: 1.02 (ref 1.000–1.030)
TOTAL PROTEIN, URINE-UPE24: NEGATIVE
Urine Glucose: NEGATIVE
Urobilinogen, UA: 0.2 (ref 0.0–1.0)
WBC, UA: NONE SEEN (ref 0–?)

## 2018-09-04 LAB — LIPID PANEL
CHOL/HDL RATIO: 3
CHOLESTEROL: 103 mg/dL (ref 0–200)
HDL: 30.2 mg/dL — AB (ref >39.00)
LDL CALC: 45 mg/dL (ref 0–99)
NonHDL: 72.77
TRIGLYCERIDES: 140 mg/dL (ref 0.0–149.0)
VLDL: 28 mg/dL (ref 0.0–40.0)

## 2018-09-04 LAB — CBC WITH DIFFERENTIAL/PLATELET
BASOS PCT: 0.8 % (ref 0.0–3.0)
Basophils Absolute: 0 10*3/uL (ref 0.0–0.1)
EOS PCT: 3.8 % (ref 0.0–5.0)
Eosinophils Absolute: 0.3 10*3/uL (ref 0.0–0.7)
HCT: 48.5 % (ref 39.0–52.0)
Hemoglobin: 16.8 g/dL (ref 13.0–17.0)
LYMPHS ABS: 2.2 10*3/uL (ref 0.7–4.0)
LYMPHS PCT: 33.6 % (ref 12.0–46.0)
MCHC: 34.6 g/dL (ref 30.0–36.0)
MCV: 89.1 fl (ref 78.0–100.0)
MONO ABS: 0.6 10*3/uL (ref 0.1–1.0)
MONOS PCT: 9.7 % (ref 3.0–12.0)
NEUTROS ABS: 3.5 10*3/uL (ref 1.4–7.7)
NEUTROS PCT: 52.1 % (ref 43.0–77.0)
Platelets: 232 10*3/uL (ref 150.0–400.0)
RBC: 5.44 Mil/uL (ref 4.22–5.81)
RDW: 13.9 % (ref 11.5–15.5)
WBC: 6.6 10*3/uL (ref 4.0–10.5)

## 2018-09-04 LAB — TSH: TSH: 1.92 u[IU]/mL (ref 0.35–4.50)

## 2018-09-04 LAB — HEMOGLOBIN A1C: Hgb A1c MFr Bld: 5.8 % (ref 4.6–6.5)

## 2018-09-04 NOTE — Progress Notes (Addendum)
Subjective:   Larry [name] is a 84 y.o. male who presents for Medicare Annual/Subsequent preventive examination.  Review of Systems:  No ROS.  Medicare Wellness Visit. Additional risk factors are reflected in the social history.  Cardiac Risk Factors include: advanced age (>27men, >14 women);dyslipidemia;hypertension;male gender Sleep patterns: feels rested on waking, gets up 1-2 times nightly to void and sleeps 7 hours nightly.    Home Safety/Smoke Alarms: Feels safe in home. Smoke alarms in place.  Living environment; residence and Firearm Safety: 2-story house, can live on one level, equipment: Cane, Type: Single [employer] and Walkers, Type: rollator, no firearms. Lives with wife, no needs for DME, good support system Seat Belt Safety/Bike Helmet: Wears seat belt.      Objective:    Vitals: BP 108/68   Pulse 98   Resp 17   Ht 5\' 7"  (1.702 m)   Wt 181 lb (82.1 kg)   SpO2 98%   BMI 28.35 kg/m   Body mass index is 28.35 kg/m.  Advanced Directives 09/05/2018 01/25/2018 12/19/2017 11/22/2017 08/31/2017 04/20/2016 04/20/2016  Does Patient Have a Medical Advance Directive? Yes - Yes No Yes Yes Yes  Type of Paramedic of [place];Living will - Living will - [place];Living will - -  Does patient want to make changes to medical advance directive? - - No - Patient declined - - - -  Copy of [place] in Chart? No - copy requested - - - No - copy requested - -  Would patient like information on creating a medical advance directive? - No - Patient declined - No - Patient declined - - -    Tobacco Social History   Tobacco Use  Smoking Status Never Smoker  Smokeless Tobacco Never Used     Counseling given: Not Answered  Past Medical History:  Diagnosis Date  . Allergic rhinitis, cause unspecified 03/12/2013  . Allergy   . CAD (coronary artery disease)    S/P CABG in 1999   . Cataract   . Decreased exercise tolerance    Exertional fatigue  . Degenerative arthritis of hip   . Diverticulosis of colon   . ED (erectile dysfunction)   . Erectile dysfunction 06/03/2014  . History of colonic polyps   . History of kidney stones   . History of MI (myocardial infarction)   . Hx of adenomatous colonic polyps 2000  . Hyperlipidemia    Low HDL  . Lumbar disc disease    Hx of with recent back pain and buttock pain  . Myocardial infarction ([place]) 1999  . Right knee meniscal tear   . S/P removal of thyroid nodule   . Spinal stenosis of lumbar region 03/15/2014   Past Surgical History:  Procedure Laterality Date  . CARDIAC CATHETERIZATION    . CATARACT EXTRACTION    . COLONOSCOPY    . CORONARY ARTERY BYPASS GRAFT  1999  . CYSTOSCOPY/URETEROSCOPY/HOLMIUM LASER/STENT PLACEMENT Left 12/23/2017   Procedure: CYSTOSCOPY/RETROGRADE/URETEROSCOPY/HOLMIUM LASER/STENT PLACEMENT;  Surgeon: Festus Aloe, MD;  Location: [facility];  Service: Urology;  Laterality: Left;  ONLY NEEDS 60 MIN  . LUMBAR DISC SURGERY    . Nodules on Thyroid    . POLYPECTOMY    . PTCA    . SHOULDER ARTHROSCOPY WITH ROTATOR CUFF REPAIR AND SUBACROMIAL DECOMPRESSION Right 01/30/2015   Procedure: RIGHT ARTHROSCOPY SHOULDER SUBACROMIAL DECOMPRESSION DISTAL CLAVICAL RESECTION RETATOR CUFF REPAIR;  Surgeon: Justice Britain, MD;  Location: [facility];  Service: Orthopedics;  Laterality: Right;  . TONSILLECTOMY    . VASECTOMY     Family History  Problem Relation Age of Onset  . Prostate cancer Other   . Prostate cancer Other   . Heart disease Mother   . Heart disease Brother   . Colon cancer Neg Hx   . Rectal cancer Neg Hx   . Stomach cancer Neg Hx    Social History   Socioeconomic History  . Marital status: Married    Spouse name: Not on file  . Number of children: 2  . Years of education: Not on file  . Highest education level: Not on file  Occupational History  . 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  Financial resource strain: Not hard at all  . Food insecurity:    Worry: Never true    Inability: Never true  . Transportation needs:    Medical: No    Non-medical: No  Tobacco Use  . Smoking status: Never Smoker  . Smokeless tobacco: Never Used  Substance and Sexual Activity  . Alcohol use: Yes    Alcohol/week: 0.0 standard drinks    Comment: rare  . Drug use: No  . Sexual activity: Not Currently  Lifestyle  . Physical activity:    Days per week: 5 days    Minutes per session: 50 min  . Stress: Not at all  Relationships  . Social connections:    Talks on phone: More than three times a week    Gets together: More than three times a week    Attends religious service: 1 to 4 times per year    Active member of club or organization: Yes    Attends meetings of clubs or organizations: More than 4 times per year    Relationship status: Married  Other Topics Concern  . Not on file  Social History Narrative   Moved from [facility]   Married      [employer] on file    Outpatient Encounter Medications as of 09/05/2018  Medication Sig  . acetaminophen (TYLENOL) 650 MG CR tablet Take 650 mg by mouth every 8 (eight) hours as needed for pain.   Marland Kitchen aspirin 81 MG EC tablet Take 81 mg by mouth at bedtime.   . calcium-vitamin D (OSCAL WITH D) 500-200 MG-UNIT per tablet Take 1 tablet by mouth 2 (two) times daily.   . cetirizine (ZYRTEC) 10 MG tablet Take 10 mg by mouth daily.  . clopidogrel (PLAVIX) 75 MG tablet Take 1 tablet (75 mg total) by mouth daily.  . fish oil-omega-3 fatty acids 1000 MG capsule Take 1 g by mouth 2 (two) times daily.   . folic acid (FOLVITE) 557 MCG tablet Take 400 mcg by mouth daily.    . Multiple Vitamin (MULTIVITAMIN WITH MINERALS) TABS tablet Take 1 tablet by mouth daily. Centrum Silver  . rosuvastatin (CRESTOR) 5 MG tablet TAKE 1 TABLET DAILY ALTERNATING WITH 2 TABLETS DAILY.  . tamsulosin (FLOMAX) 0.4 MG CAPS capsule TAKE 1 CAPSULE BY MOUTH EVERY  DAY   No facility-administered encounter medications on file as of 09/05/2018.     Activities of Daily Living In your present state of health, do you have any difficulty performing the following activities: 09/05/2018 12/19/2017  Hearing? N N  Vision? N N  Difficulty concentrating or making decisions? N N  Walking or climbing stairs? N N  Dressing or bathing? N N  Doing errands, shopping?  N N  Preparing Food and eating ? N -  Using the Toilet? N -  In the past six months, have you accidently leaked urine? N -  Do you have problems with loss of bowel control? N -  Managing your Medications? N -  Managing your Finances? N -  Housekeeping or managing your Housekeeping? N -  Some recent data might be hidden    Patient Care Team: Biagio Borg, MD as PCP - General Angelena Form Annita Brod, MD as PCP - Cardiology (Cardiology)   Assessment:   This is a routine wellness examination for Larry [name]. Physical assessment deferred to PCP.   Exercise Activities and Dietary recommendations Current Exercise Habits: Home exercise routine, Type of exercise: walking;calisthenics;stretching(stationary bike), Time (Minutes): 50, Frequency (Times/Week): 5, Weekly Exercise (Minutes/Week): 250, Intensity: Mild, Exercise limited by: orthopedic condition(s)  Diet (meal preparation, eat out, water intake, caffeinated beverages, dairy products, fruits and vegetables): in general, a "healthy" diet  , well balanced   Reviewed heart healthy diet. Encouraged patient to increase daily water and healthy fluid intake.  Goals      Patient Stated   . patient (pt-stated)     Goal is to stay healthy and continue to progress as tolerated       Other   . I want to be able to walk further and have more endurance     Do more physical therapy for balance, get a walker to use, and be as active as possible.    Marland Kitchen LDL CALC < 70    . Patient Stated     Continue to exercise, eat healthy, enjoy life and family.        Fall Risk Fall Risk  09/05/2018 09/01/2018 08/31/2017 04/20/2016 03/20/2015  Falls in the past year? No No No No No  Risk for fall due to : Impaired balance/gait;Impaired mobility - Impaired balance/gait - -    Depression Screen PHQ 2/9 Scores 09/05/2018 09/01/2018 08/31/2017 04/20/2016  PHQ - 2 Score 1 1 0 0  PHQ- 9 Score - - 2 -    Cognitive Function MMSE - Mini Mental State Exam 09/05/2018 04/20/2016 04/20/2016  Not completed: - (No Data) (No Data)  Orientation to time 5 - -  Orientation to Place 5 - -  Registration 3 - -  Attention/ Calculation 4 - -  Recall 2 - -  Language- name 2 objects 2 - -  Language- repeat 1 - -  Language- follow 3 step command 3 - -  Language- read & follow direction 1 - -  Write a sentence 1 - -  Copy design 1 - -  Total score 28 - -        Immunization History  Administered Date(s) Administered  . Influenza Split 10/01/2011, 09/06/2012  . Influenza Whole 09/20/2007, 10/04/2008, 10/13/2009, 09/07/2010  . Influenza, High Dose Seasonal PF 08/12/2017, 09/01/2018  . Influenza,inj,Quad PF,6+ Mos 09/26/2013, 09/19/2014  . Influenza-Unspecified 10/15/2015  . Pneumococcal Conjugate-13 03/15/2014  . Pneumococcal Polysaccharide-23 01/20/2007  . Td 01/20/2002  . Tetanus 03/12/2013   Screening Tests Health Maintenance  Topic Date Due  . TETANUS/TDAP  03/13/2023  . INFLUENZA VACCINE  Completed  . PNA vac Low Risk Adult  Completed     Plan:    Continue doing brain stimulating activities (puzzles, reading, adult coloring books, staying active) to keep memory sharp.   Continue to eat heart healthy diet (full of fruits, vegetables, whole grains, lean protein, water--limit salt, fat, and sugar intake) and  increase physical activity as tolerated.  I have personally reviewed and noted the following in the patient's chart:   . Medical and social history . Use of alcohol, tobacco or illicit drugs  . Current medications and supplements . Functional ability  and status . Nutritional status . Physical activity . Advanced directives . List of other physicians . Vitals . Screenings to include cognitive, depression, and falls . Referrals and appointments  In addition, I have reviewed and discussed with patient certain preventive protocols, quality metrics, and best practice recommendations. A written personalized care plan for preventive services as well as general preventive health recommendations were provided to patient.     Michiel Cowboy, RN  09/05/2018  Medical screening examination/treatment/procedure(s) were performed by non-physician practitioner and as supervising physician I was immediately available for consultation/collaboration. I agree with above. Cathlean Cower, MD

## 2018-09-05 ENCOUNTER — Ambulatory Visit (INDEPENDENT_AMBULATORY_CARE_PROVIDER_SITE_OTHER): Payer: Medicare Other | Admitting: *Deleted

## 2018-09-05 ENCOUNTER — Telehealth: Payer: Self-pay | Admitting: *Deleted

## 2018-09-05 VITALS — BP 108/68 | HR 98 | Resp 17 | Ht 67.0 in | Wt 181.0 lb

## 2018-09-05 DIAGNOSIS — Z0000 Encounter for general adult medical examination without abnormal findings: Secondary | ICD-10-CM | POA: Diagnosis not present

## 2018-09-05 NOTE — Telephone Encounter (Signed)
If born before 1993, he is presumed to have immunity.  thanks

## 2018-09-05 NOTE — Patient Instructions (Addendum)
Continue doing brain stimulating activities (puzzles, reading, adult coloring books, staying active) to keep memory sharp.   Continue to eat heart healthy diet (full of fruits, vegetables, whole grains, lean protein, water--limit salt, fat, and sugar intake) and increase physical activity as tolerated.   Larry [name] , Thank you for taking time to come for your Medicare Wellness Visit. I appreciate your ongoing commitment to your health goals. Please review the following plan we discussed and let me know if I can assist you in the future.   These are the goals we discussed: Goals      Patient Stated   . patient (pt-stated)     Goal is to stay healthy and continue to progress as tolerated       Other   . I want to be able to walk further and have more endurance     Do more physical therapy for balance, get a walker to use, and be as active as possible.    Marland Kitchen LDL CALC < 70    . Patient Stated     Continue to exercise, eat healthy, enjoy life and family.       This is a list of the screening recommended for you and due dates:  Health Maintenance  Topic Date Due  . Tetanus Vaccine  03/13/2023  . Flu Shot  Completed  . Pneumonia vaccines  Completed     Health Maintenance, Male A healthy lifestyle and preventive care is important for your health and wellness. Ask your health care provider about what schedule of regular examinations is right for you. What should I know about weight and diet? Eat a Healthy Diet  Eat plenty of vegetables, fruits, whole grains, low-fat dairy products, and lean protein.  Do not eat a lot of foods high in solid fats, added sugars, or salt.  Maintain a Healthy Weight Regular exercise can help you achieve or maintain a healthy weight. You should:  Do at least 150 minutes of exercise each week. The exercise should increase your heart rate and make you sweat (moderate-intensity exercise).  Do strength-training exercises at least twice a week.  Watch  Your Levels of Cholesterol and Blood Lipids  Have your blood tested for lipids and cholesterol every 5 years starting at 82 years of age. If you are at high risk for heart disease, you should start having your blood tested when you are 82 years old. You may need to have your cholesterol levels checked more often if: ? Your lipid or cholesterol levels are high. ? You are older than 82 years of age. ? You are at high risk for heart disease.  What should I know about cancer screening? Many types of cancers can be detected early and may often be prevented. Lung Cancer  You should be screened every year for lung cancer if: ? You are a current smoker who has smoked for at least 30 years. ? You are a former smoker who has quit within the past 15 years.  Talk to your health care provider about your screening options, when you should start screening, and how often you should be screened.  Colorectal Cancer  Routine colorectal cancer screening usually begins at 82 years of age and should be repeated every 5-10 years until you are 82 years old. You may need to be screened more often if early forms of precancerous polyps or small growths are found. Your health care provider may recommend screening at an earlier age if you have  risk factors for colon cancer.  Your health care provider may recommend using home test kits to check for hidden blood in the stool.  A small camera at the end of a tube can be used to examine your colon (sigmoidoscopy or colonoscopy). This checks for the earliest forms of colorectal cancer.  Prostate and Testicular Cancer  Depending on your age and overall health, your health care provider may do certain tests to screen for prostate and testicular cancer.  Talk to your health care provider about any symptoms or concerns you have about testicular or prostate cancer.  Skin Cancer  Check your skin from head to toe regularly.  Tell your health care provider about any new  moles or changes in moles, especially if: ? There is a change in a mole's size, shape, or color. ? You have a mole that is larger than a pencil eraser.  Always use sunscreen. Apply sunscreen liberally and repeat throughout the day.  Protect yourself by wearing long sleeves, pants, a wide-brimmed hat, and sunglasses when outside.  What should I know about heart disease, diabetes, and high blood pressure?  If you are 27-34 years of age, have your blood pressure checked every 3-5 years. If you are 38 years of age or older, have your blood pressure checked every year. You should have your blood pressure measured twice-once when you are at a hospital or clinic, and once when you are not at a hospital or clinic. Record the average of the two measurements. To check your blood pressure when you are not at a hospital or clinic, you can use: ? An automated blood pressure machine at a pharmacy. ? A home blood pressure monitor.  Talk to your health care provider about your target blood pressure.  If you are between 1-85 years old, ask your health care provider if you should take aspirin to prevent heart disease.  Have regular diabetes screenings by checking your fasting blood sugar level. ? If you are at a normal weight and have a low risk for diabetes, have this test once every three years after the age of 44. ? If you are overweight and have a high risk for diabetes, consider being tested at a younger age or more often.  A one-time screening for abdominal aortic aneurysm (AAA) by ultrasound is recommended for men aged 31-75 years who are current or former smokers. What should I know about preventing infection? Hepatitis B If you have a higher risk for hepatitis B, you should be screened for this virus. Talk with your health care provider to find out if you are at risk for hepatitis B infection. Hepatitis C Blood testing is recommended for:  Everyone born from [birth year] through 1963.  Anyone with  known risk factors for hepatitis C.  Sexually Transmitted Diseases (STDs)  You should be screened each year for STDs including gonorrhea and chlamydia if: ? You are sexually active and are younger than 82 years of age. ? You are older than 82 years of age and your health care provider tells you that you are at risk for this type of infection. ? Your sexual activity has changed since you were last screened and you are at an increased risk for chlamydia or gonorrhea. Ask your health care provider if you are at risk.  Talk with your health care provider about whether you are at high risk of being infected with HIV. Your health care provider may recommend a prescription medicine to help prevent HIV infection.  What else can I do?  Schedule regular health, dental, and eye exams.  Stay current with your vaccines (immunizations).  Do not use any tobacco products, such as cigarettes, chewing tobacco, and e-cigarettes. If you need help quitting, ask your health care provider.  Limit alcohol intake to no more than 2 drinks per day. One drink equals 12 ounces of beer, 5 ounces of Aeon Kessner, or 1 ounces of hard liquor.  Do not use street drugs.  Do not share needles.  Ask your health care provider for help if you need support or information about quitting drugs.  Tell your health care provider if you often feel depressed.  Tell your health care provider if you have ever been abused or do not feel safe at home. This information is not intended to replace advice given to you by your health care provider. Make sure you discuss any questions you have with your health care provider. Document Released: 06/03/2008 Document Revised: 08/04/2016 Document Reviewed: 09/09/2015 Elsevier Interactive Patient Education  [employer].

## 2018-09-05 NOTE — Telephone Encounter (Signed)
During AWV, patient stated that he does not know if he has ever been vaccinated for measles and wants to know what PCP recommends for him to do to best be protected.

## 2018-09-06 NOTE — Telephone Encounter (Signed)
Called and spoke to patient's wife to inform that PCP presumes that the patient would have immunity to the measles due to his birth date. Wife verbalized understanding and stated she would inform the patient.

## 2018-11-13 ENCOUNTER — Other Ambulatory Visit: Payer: Self-pay | Admitting: Internal Medicine

## 2018-12-19 IMAGING — CT CT RENAL STONE PROTOCOL
2 of 5 series · 16 of 46 positions shown, 18 images · non-contrast
Comparison: CT scan of May 06, 2014.

CLINICAL DATA: Acute bilateral flank pain, gross hematuria.

EXAM:
CT ABDOMEN AND PELVIS WITHOUT CONTRAST
TECHNIQUE: Multidetector CT imaging of the abdomen and pelvis was performed
following the standard protocol without IV contrast.

[Series 3: stone study 5.0 i30f 2 · axial · 0.79mm/px · z∈[+798,+1248]mm · 13 of 101 slices shown, 15 images]
[im 6/101  soft-tissue]
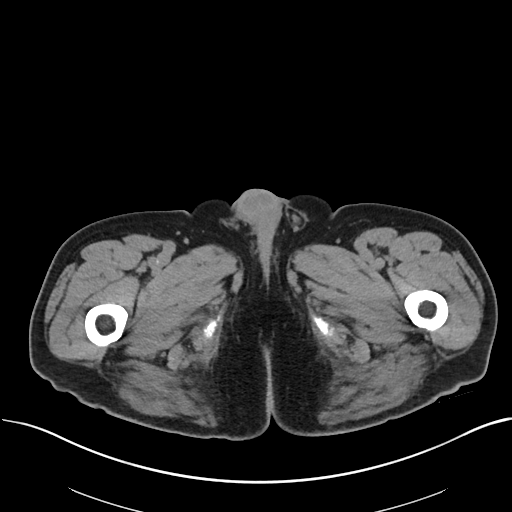
[im 6/101  bone]
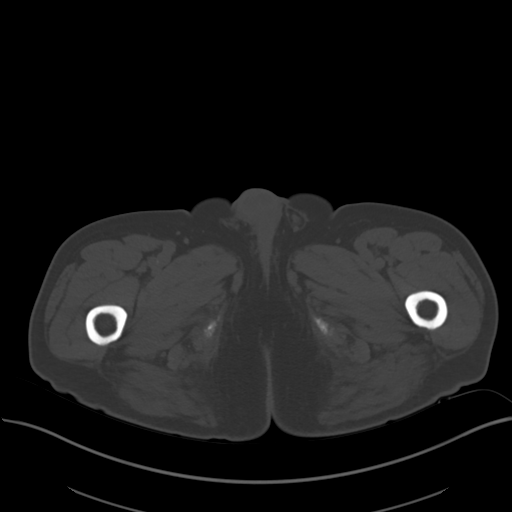
[im 16/101  soft-tissue]
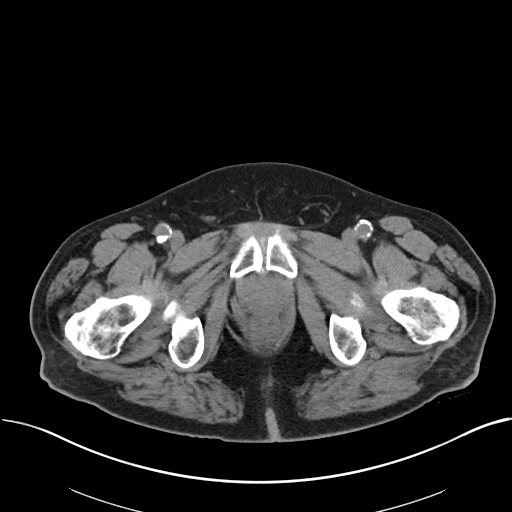
[im 21/101  soft-tissue]
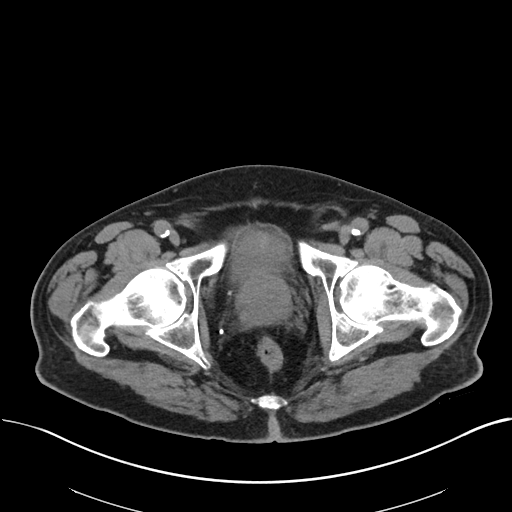
[im 31/101  soft-tissue]
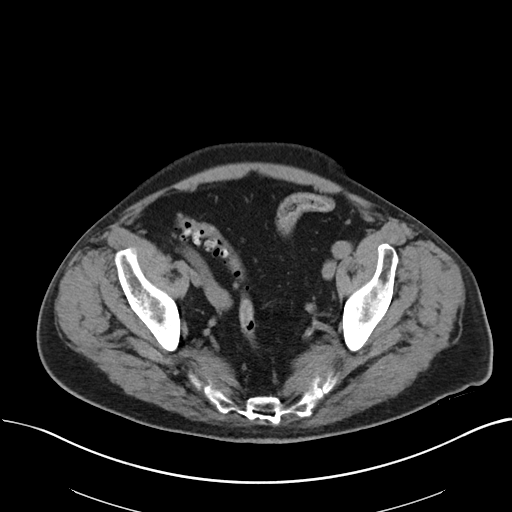
[im 36/101  soft-tissue]
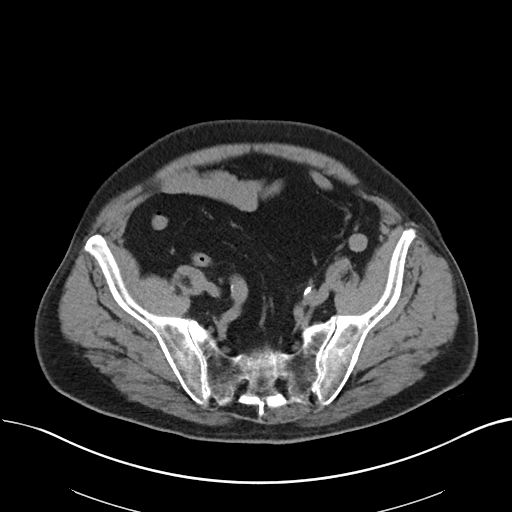
[im 46/101  soft-tissue]
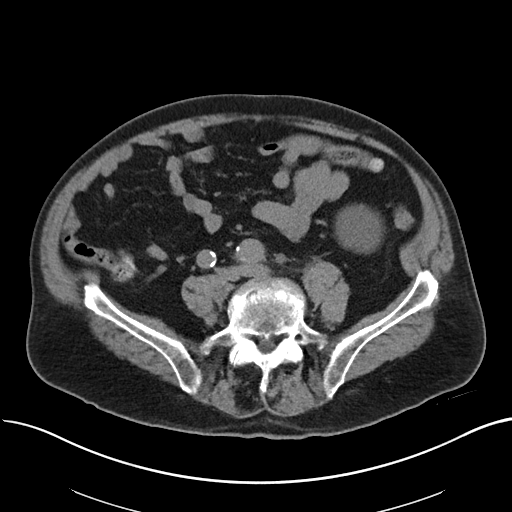
[im 51/101  soft-tissue]
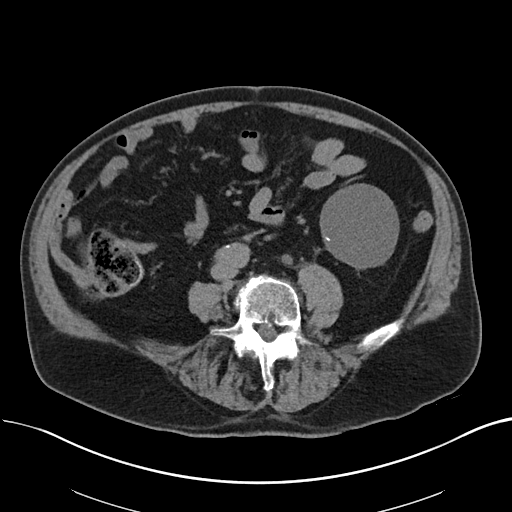
[im 56/101  soft-tissue]
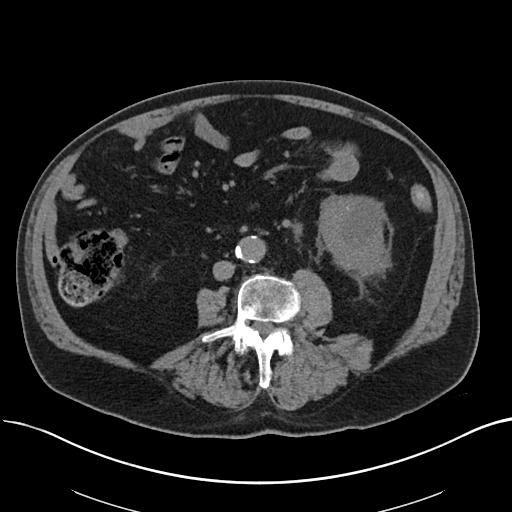
[im 66/101  soft-tissue]
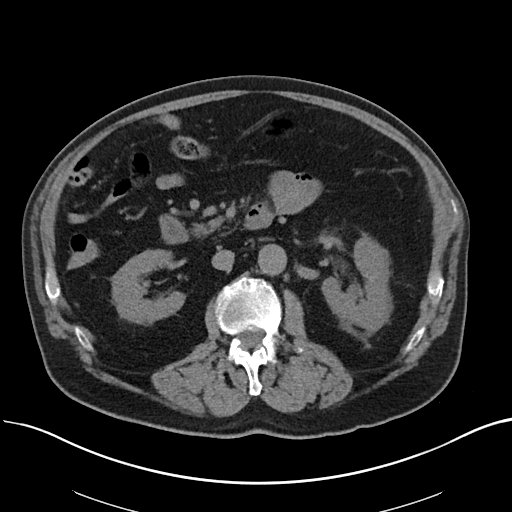
[im 66/101  bone]
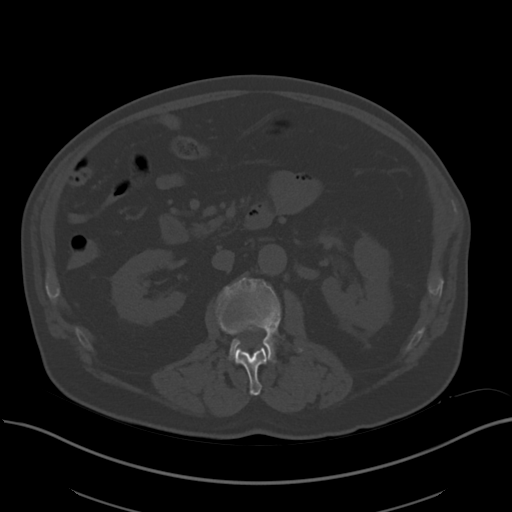
[im 71/101  soft-tissue]
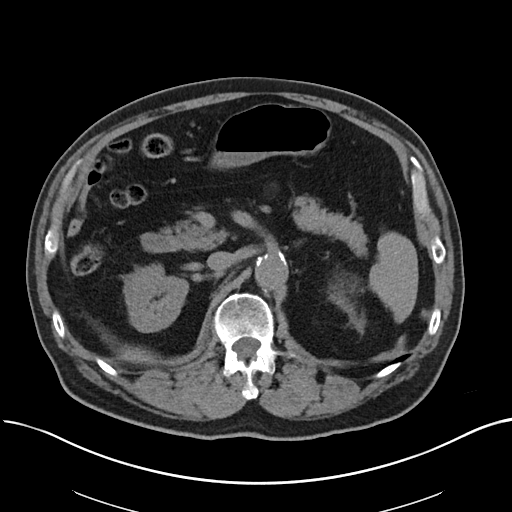
[im 81/101  soft-tissue]
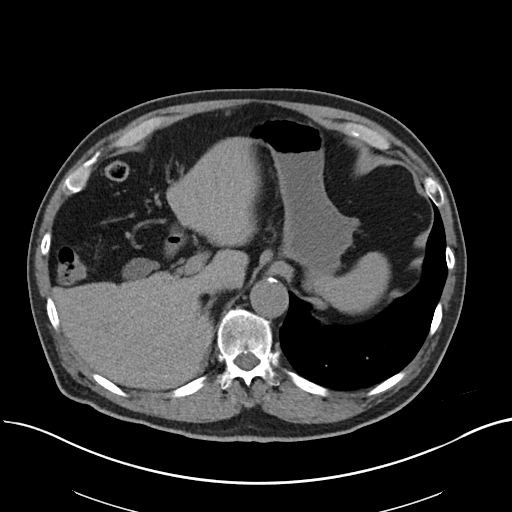
[im 86/101  soft-tissue]
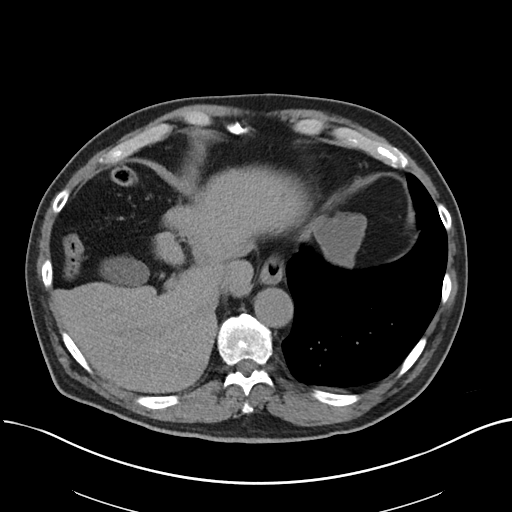
[im 96/101  soft-tissue]
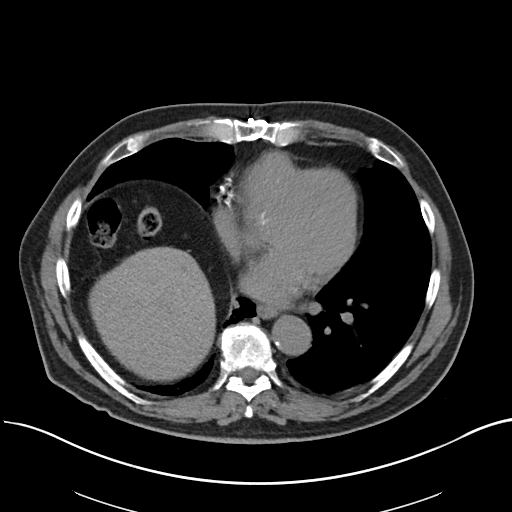

[Series 6: coronal soft tissue · coronal · 0.86mm/px · 3 of 111 slices shown]
[im 37/111  soft-tissue]
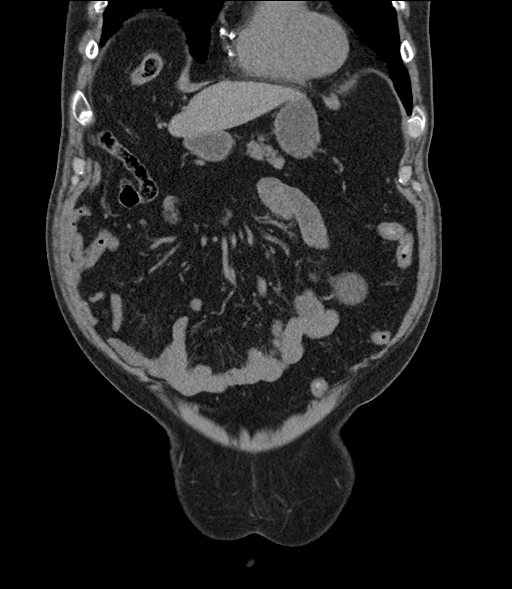
[im 49/111  soft-tissue]
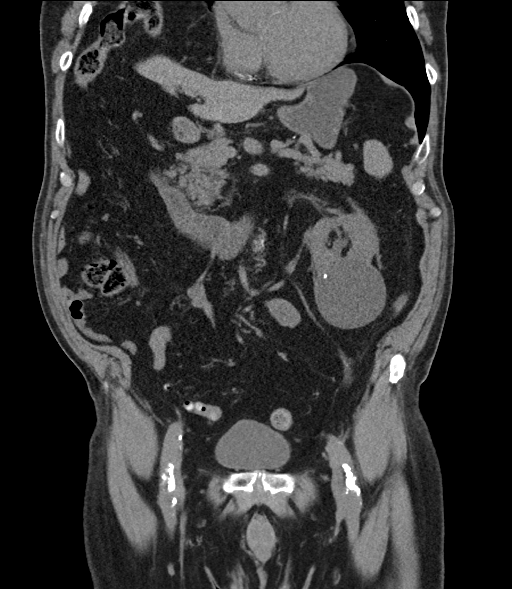
[im 62/111  soft-tissue]
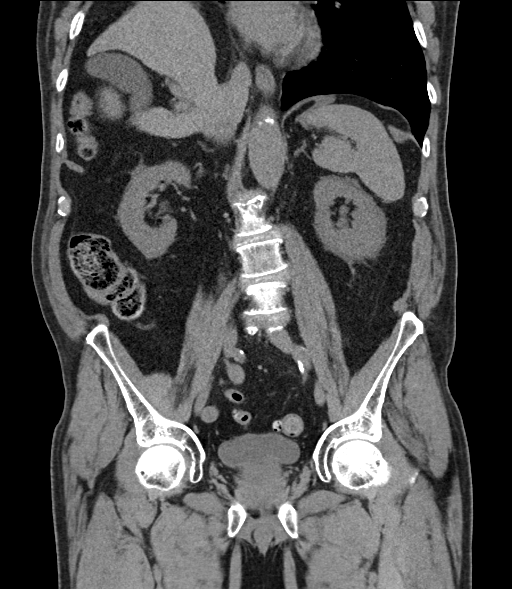

[16 of 46 positions shown; findings below may reference images not displayed]

FINDINGS: Lower chest: No acute abnormality.

Hepatobiliary: No focal liver abnormality is seen. No gallstones,
gallbladder wall thickening, or biliary dilatation.

Pancreas: Unremarkable. No pancreatic ductal dilatation or
surrounding inflammatory changes.

Spleen: Normal in size without focal abnormality.

Adrenals/Urinary Tract: Adrenal glands appear normal. Right kidney
and ureter are unremarkable per stable partially calcified large
cyst is seen arising from lower pole of left kidney. Mild left
hydroureteronephrosis is noted secondary to 6 mm calculus in
midportion of right ureter. Urinary bladder is unremarkable.

Stomach/Bowel: Stomach is within normal limits. Appendix appears
normal. No evidence of bowel wall thickening, distention, or
inflammatory changes. Sigmoid diverticulosis is noted without
inflammation.

Vascular/Lymphatic: Aortic atherosclerosis. No enlarged abdominal or
pelvic lymph nodes.

Reproductive: Mild prostatic enlargement is noted.

Other: Small fat containing left inguinal hernia is noted. No
abnormal fluid collection is noted.

Musculoskeletal: No acute or significant osseous findings.
IMPRESSION: Mild left hydroureteronephrosis is noted secondary to 6 mm right
ureteral calculus.

Sigmoid diverticulosis is noted without inflammation.

Aortic atherosclerosis.

Mild prostatic enlargement.

## 2018-12-28 ENCOUNTER — Ambulatory Visit (INDEPENDENT_AMBULATORY_CARE_PROVIDER_SITE_OTHER): Payer: Medicare Other | Admitting: Cardiovascular Disease

## 2018-12-28 ENCOUNTER — Encounter: Payer: Self-pay | Admitting: Cardiovascular Disease

## 2018-12-28 VITALS — BP 124/86 | HR 74 | Ht 67.0 in | Wt 183.1 lb

## 2018-12-28 DIAGNOSIS — I493 Ventricular premature depolarization: Secondary | ICD-10-CM | POA: Diagnosis not present

## 2018-12-28 DIAGNOSIS — E7800 Pure hypercholesterolemia, unspecified: Secondary | ICD-10-CM

## 2018-12-28 DIAGNOSIS — I25810 Atherosclerosis of coronary artery bypass graft(s) without angina pectoris: Secondary | ICD-10-CM

## 2018-12-28 NOTE — Progress Notes (Signed)
Chief Complaint  Patient presents with  . Follow-up    CAD   History of Present Illness: 85yo male with history of CAD s/p 3V CABG in 1999, HLD and PVCs here for cardiac follow up. He had 3V CABG in 1999 at [facility] in [place]. In 2008 he had a catheterization and was found to have a patent LIMA to LAD and a patent vein graft to the right coronary and no significant obstruction in the circumflex artery. Exercise stress test April 2015 with no ischemia. He has had PVCs noted on cardiac monitor and was started on Cardizem but he did not tolerate the Cardizem due to dizziness so it was stopped.   He is here today for follow up. The patient denies any chest pain, dyspnea, palpitations, lower extremity edema, orthopnea, PND, dizziness, near syncope or syncope. He is exercising 6 days per week.   Primary Care Physician: Biagio Borg, MD   Past Medical History:  Diagnosis Date  . Allergic rhinitis, cause unspecified 03/12/2013  . Allergy   . CAD (coronary artery disease)    S/P CABG in 1999   . Cataract   . Decreased exercise tolerance    Exertional fatigue  . Degenerative arthritis of hip   . Diverticulosis of colon   . ED (erectile dysfunction)   . Erectile dysfunction 06/03/2014  . History of colonic polyps   . History of kidney stones   . History of MI (myocardial infarction)   . Hx of adenomatous colonic polyps 2000  . Hyperlipidemia    Low HDL  . Lumbar disc disease    Hx of with recent back pain and buttock pain  . Myocardial infarction ([place]) 1999  . Right knee meniscal tear   . S/P removal of thyroid nodule   . Spinal stenosis of lumbar region 03/15/2014    Past Surgical History:  Procedure Laterality Date  . CARDIAC CATHETERIZATION    . CATARACT EXTRACTION    . COLONOSCOPY    . CORONARY ARTERY BYPASS GRAFT  1999  . CYSTOSCOPY/URETEROSCOPY/HOLMIUM LASER/STENT PLACEMENT Left 12/23/2017   Procedure: CYSTOSCOPY/RETROGRADE/URETEROSCOPY/HOLMIUM LASER/STENT  PLACEMENT;  Surgeon: Festus Aloe, MD;  Location: [facility];  Service: Urology;  Laterality: Left;  ONLY NEEDS 60 MIN  . LUMBAR DISC SURGERY    . Nodules on Thyroid    . POLYPECTOMY    . PTCA    . SHOULDER ARTHROSCOPY WITH ROTATOR CUFF REPAIR AND SUBACROMIAL DECOMPRESSION Right 01/30/2015   Procedure: RIGHT ARTHROSCOPY SHOULDER SUBACROMIAL DECOMPRESSION DISTAL CLAVICAL RESECTION RETATOR CUFF REPAIR;  Surgeon: Justice Britain, MD;  Location: [place];  Service: Orthopedics;  Laterality: Right;  . TONSILLECTOMY    . VASECTOMY      Current Outpatient Medications  Medication Sig Dispense Refill  . acetaminophen (TYLENOL) 650 MG CR tablet Take 650 mg by mouth every 8 (eight) hours as needed for pain.     . calcium-vitamin D (OSCAL WITH D) 500-200 MG-UNIT per tablet Take 1 tablet by mouth 2 (two) times daily.     . cetirizine (ZYRTEC) 10 MG tablet Take 10 mg by mouth daily.    . clopidogrel (PLAVIX) 75 MG tablet Take 1 tablet (75 mg total) by mouth daily. 90 tablet 3  . fish oil-omega-3 fatty acids 1000 MG capsule Take 1 g by mouth 2 (two) times daily.     . folic acid (FOLVITE) 947 MCG tablet Take 400 mcg by mouth daily.      . Multiple Vitamin (MULTIVITAMIN WITH  MINERALS) TABS tablet Take 1 tablet by mouth daily. Centrum Silver    . rosuvastatin (CRESTOR) 5 MG tablet TAKE 1 TABLET DAILY ALTERNATING WITH 2 TABLETS DAILY. 135 tablet 3  . tamsulosin (FLOMAX) 0.4 MG CAPS capsule TAKE 1 CAPSULE BY MOUTH EVERY DAY 90 capsule 0   No current facility-administered medications for this visit.     Allergies  Allergen Reactions  . Beta Adrenergic Blockers Other (See Comments)    Dropped blood pressure too low  . Simvastatin Other (See Comments)    myalgia    Social History   Socioeconomic History  . Marital status: Married    Spouse name: Not on file  . Number of children: 2  . Years of education: Not on file  . Highest education level: Not on file  Occupational History  . 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 Financial resource strain: Not hard at all  . Food insecurity:    Worry: Never true    Inability: Never true  . Transportation needs:    Medical: No    Non-medical: No  Tobacco Use  . Smoking status: Never Smoker  . Smokeless tobacco: Never Used  Substance and Sexual Activity  . Alcohol use: Yes    Alcohol/week: 0.0 standard drinks    Comment: rare  . Drug use: No  . Sexual activity: Not Currently  Lifestyle  . Physical activity:    Days per week: 5 days    Minutes per session: 50 min  . Stress: Not at all  Relationships  . Social connections:    Talks on phone: More than three times a week    Gets together: More than three times a week    Attends religious service: 1 to 4 times per year    Active member of club or organization: Yes    Attends meetings of clubs or organizations: More than 4 times per year    Relationship status: Married  . Intimate partner violence:    Fear of current or ex partner: No    Emotionally abused: No    Physically abused: No    Forced sexual activity: No  Other Topics Concern  . Not on file  Social History Narrative   Moved from [facility]   Married      [employer] on file    Family History  Problem Relation Age of Onset  . Prostate cancer Other   . Prostate cancer Other   . Heart disease Mother   . Heart disease Brother   . Colon cancer Neg Hx   . Rectal cancer Neg Hx   . Stomach cancer Neg Hx     Review of Systems:  As stated in the HPI and otherwise negative.   BP 124/86   Pulse 74   Ht 5\' 7"  (1.702 m)   Wt 183 lb 1.9 oz (83.1 kg)   SpO2 97%   BMI 28.68 kg/m   Physical Examination:  General: Well developed, well nourished, NAD  HEENT: OP clear, mucus membranes moist  SKIN: warm, dry. No rashes. Neuro: No focal deficits  Musculoskeletal: Muscle strength 5/5 all ext  Psychiatric: Mood and affect normal  Neck: No JVD, no carotid bruits, no  thyromegaly, no lymphadenopathy.  Lungs:Clear bilaterally, no wheezes, rhonci, crackles Cardiovascular: Regular rate and rhythm. No murmurs, gallops or rubs. Abdomen:Soft. Bowel sounds present. Non-tender.  Extremities: No lower extremity edema. Pulses are 2 + in the  bilateral DP/PT.  Echo October 2013: Left ventricle: The cavity size was mildly dilated. Wall thickness was increased in a pattern of mild LVH. Systolic function was normal. The estimated ejection fraction was in the range of 50% to 55%. Wall motion was normal; there were no regional wall motion abnormalities. - Aortic valve: Trivial regurgitation. - Mitral valve: Mild regurgitation. - Left atrium: The atrium was mildly dilated. - Atrial septum: No defect or patent foramen ovale was Identified.  EKG:  EKG is ordered today. The ekg ordered today demonstrates  Sinus rhythm, rate 74 bpm. PAC.   Recent Labs: 09/04/2018: ALT 14; BUN 23; Creatinine, Ser 1.17; Hemoglobin 16.8; Platelets 232.0; Potassium 4.0; Sodium 140; TSH 1.92   Lipid Panel    Component Value Date/Time   CHOL 103 09/04/2018 1018   CHOL 121 05/23/2018 0939   CHOL 118 04/03/2014 0843   TRIG 140.0 09/04/2018 1018   TRIG 162 (H) 04/03/2014 0843   HDL 30.20 (L) 09/04/2018 1018   HDL 34 (L) 05/23/2018 0939   HDL 36 (L) 04/03/2014 0843   CHOLHDL 3 09/04/2018 1018   VLDL 28.0 09/04/2018 1018   LDLCALC 45 09/04/2018 1018   LDLCALC 61 05/23/2018 0939   LDLCALC 50 04/03/2014 0843   LDLDIRECT 91.5 02/13/2008 1400     Wt Readings from Last 3 Encounters:  12/28/18 183 lb 1.9 oz (83.1 kg)  09/05/18 181 lb (82.1 kg)  09/01/18 181 lb (82.1 kg)     Other studies Reviewed: Additional studies/ records that were reviewed today include: . Review of the above records demonstrates:    Assessment and Plan:   1. CAD s/p CABG without angina: He has no chest pain worrisome for angina. Will continue ASA, Plavix and statin.  I will arrange an echo to assess LV  function. No echo since 2013.   2. PVCs:No palpitations. He did not tolerate Cardizem in the past.   3. HLD: LDL at goal. Continue statin.   He does not wish to schedule a stress test at this time since he may not proceed with the back surgery. He will call with changes.   Current medicines are reviewed at length with the patient today.  The patient does not have concerns regarding medicines.  The following changes have been made:  no change  Labs/ tests ordered today include:   Orders Placed This Encounter  Procedures  . EKG 12-Lead  . ECHOCARDIOGRAM COMPLETE    Disposition:   FU with me in 6 months  Signed, Lauree Chandler, MD 12/28/2018 3:53 PM    [facility] [address], [place], [place]  [address] Phone: [phone]; Fax: [phone]

## 2018-12-28 NOTE — Patient Instructions (Addendum)
Medication Instructions:  Your physician has recommended you make the following change in your medication: Stop aspirin  If you need a refill on your cardiac medications before your next appointment, please call your pharmacy.   Lab work: none If you have labs (blood work) drawn today and your tests are completely normal, you will receive your results only by: Marland Kitchen MyChart Message (if you have MyChart) OR . A paper copy in the mail If you have any lab test that is abnormal or we need to change your treatment, we will call you to review the results.  Testing/Procedures: Your physician has requested that you have an echocardiogram. Echocardiography is a painless test that uses sound waves to create images of your heart. It provides your doctor with information about the size and shape of your heart and how well your heart's chambers and valves are working. This procedure takes approximately one hour. There are no restrictions for this procedure.    Follow-Up: At [facility], you and your health needs are our priority.  As part of our continuing mission to provide you with exceptional heart care, we have created designated Provider Care Teams.  These Care Teams include your primary Cardiologist (physician) and Advanced Practice Providers (APPs -  Physician Assistants and Nurse Practitioners) who all work together to provide you with the care you need, when you need it. You will need a follow up appointment in 6 months.  Please call our office 2 months in advance to schedule this appointment.  You may see Lauree Chandler, MD or one of the following Advanced Practice Providers on your designated Care Team:   [place], PA-C Melina Copa, PA-C . Ermalinda Barrios, PA-C  Any Other Special Instructions Will Be Listed Below (If Applicable).

## 2019-01-08 ENCOUNTER — Ambulatory Visit (HOSPITAL_COMMUNITY): Payer: Medicare Other | Attending: Cardiology

## 2019-01-08 ENCOUNTER — Other Ambulatory Visit: Payer: Self-pay

## 2019-01-08 DIAGNOSIS — I25810 Atherosclerosis of coronary artery bypass graft(s) without angina pectoris: Secondary | ICD-10-CM

## 2019-01-09 ENCOUNTER — Telehealth: Payer: Self-pay

## 2019-01-09 DIAGNOSIS — Z01812 Encounter for preprocedural laboratory examination: Secondary | ICD-10-CM

## 2019-01-09 DIAGNOSIS — I77810 Thoracic aortic ectasia: Secondary | ICD-10-CM

## 2019-01-09 NOTE — Telephone Encounter (Signed)
-----   Message from Burnell Blanks, MD sent at 01/09/2019  8:29 AM EST ----- His echo is overall unchanged. His heart is strong. Mild leakiness of the aortic valve. His aortic root is mildly dilated. This has changed from 41 mm to 43 mm since last echo in 2013. I would recommend that we arrange a chest CTA to evaluate the aortic root and ascending aorta more effectively if he is willing to proceed. Thanks, [name]

## 2019-01-09 NOTE — Telephone Encounter (Signed)
Notes recorded by Frederik Schmidt, RN on 01/09/2019 at 9:32 AM EST The patient has been notified of the Echo result and verbalized understanding. We will call him to schedule Chest CT and he is coming in for [facility] 1/22. All questions (if any) were answered. Frederik Schmidt, RN 01/09/2019 9:30 AM

## 2019-01-09 NOTE — Telephone Encounter (Signed)
Notes recorded by Frederik Schmidt, RN on 01/09/2019 at 8:43 AM EST [email or web address] (Echo Results/recommendations) 01/09/2019. ------

## 2019-01-10 ENCOUNTER — Other Ambulatory Visit: Payer: Medicare Other | Admitting: *Deleted

## 2019-01-10 DIAGNOSIS — I77810 Thoracic aortic ectasia: Secondary | ICD-10-CM | POA: Diagnosis not present

## 2019-01-10 DIAGNOSIS — Z01812 Encounter for preprocedural laboratory examination: Secondary | ICD-10-CM | POA: Diagnosis not present

## 2019-01-11 LAB — BASIC METABOLIC PANEL
BUN/Creatinine Ratio: 13 (ref 10–24)
BUN: 16 mg/dL (ref 8–27)
CO2: 24 mmol/L (ref 20–29)
Calcium: 9.3 mg/dL (ref 8.6–10.2)
Chloride: 102 mmol/L (ref 96–106)
Creatinine, Ser: 1.21 mg/dL (ref 0.76–1.27)
GFR calc Af Amer: 63 mL/min/{1.73_m2} (ref >59)
GFR calc non Af Amer: 55 mL/min/{1.73_m2} — ABNORMAL LOW (ref >59)
Glucose: 95 mg/dL (ref 65–99)
Potassium: 4.3 mmol/L (ref 3.5–5.2)
SODIUM: 141 mmol/L (ref 134–144)

## 2019-01-15 ENCOUNTER — Ambulatory Visit (INDEPENDENT_AMBULATORY_CARE_PROVIDER_SITE_OTHER)
Admission: RE | Admit: 2019-01-15 | Discharge: 2019-01-15 | Disposition: A | Discharge status: Home / Self Care | Payer: Medicare Other | Source: Ambulatory Visit | Attending: Cardiovascular Disease | Admitting: Cardiovascular Disease

## 2019-01-15 DIAGNOSIS — I77810 Thoracic aortic ectasia: Secondary | ICD-10-CM

## 2019-01-15 DIAGNOSIS — Z01812 Encounter for preprocedural laboratory examination: Secondary | ICD-10-CM | POA: Diagnosis not present

## 2019-01-15 DIAGNOSIS — I719 Aortic aneurysm of unspecified site, without rupture: Secondary | ICD-10-CM | POA: Diagnosis not present

## 2019-01-15 MED ORDER — IOPAMIDOL (ISOVUE-370) INJECTION 76%
100.0000 mL | Freq: Once | INTRAVENOUS | Status: AC | PRN
  Administered 2019-01-15: 100 mL via INTRAVENOUS

## 2019-01-16 ENCOUNTER — Telehealth: Payer: Self-pay

## 2019-01-16 DIAGNOSIS — Z01812 Encounter for preprocedural laboratory examination: Secondary | ICD-10-CM

## 2019-01-16 DIAGNOSIS — I25810 Atherosclerosis of coronary artery bypass graft(s) without angina pectoris: Secondary | ICD-10-CM

## 2019-01-16 DIAGNOSIS — I77810 Thoracic aortic ectasia: Secondary | ICD-10-CM

## 2019-01-16 NOTE — Telephone Encounter (Signed)
-----   Message from Burnell Blanks, MD sent at 01/15/2019  5:49 PM EST ----- Mildly dilated ascending aorta. Will monitor for now. Will need repeat chest CTA in one year. Thanks, chris

## 2019-01-16 NOTE — Telephone Encounter (Signed)
Pt Wife, nancy on DPR, notified that the pts results of his CT and verbalized understanding to have repeated on one year.. will be seeing Dr. Angelena Form  in 6 months.

## 2019-01-29 ENCOUNTER — Telehealth: Payer: Self-pay | Admitting: *Deleted

## 2019-01-29 MED ORDER — ROSUVASTATIN CALCIUM 10 MG PO TABS: ORAL_TABLET | ORAL | 3 refills | Status: DC

## 2019-01-29 NOTE — Telephone Encounter (Signed)
Received fax from [facility] on [facility] stating pt requests change of Rosuvastatin prescription to 10 mg because insurance will not pay for 135 tablets for 90 day supply.  Pt takes 5 mg alternating with 10 mg. Will send new prescription for 10 mg tablets to [facility].  Pt's wife made aware of change

## 2019-02-04 ENCOUNTER — Other Ambulatory Visit: Payer: Self-pay | Admitting: Internal Medicine

## 2019-02-12 ENCOUNTER — Ambulatory Visit (INDEPENDENT_AMBULATORY_CARE_PROVIDER_SITE_OTHER): Payer: Medicare Other | Admitting: Internal Medicine

## 2019-02-12 ENCOUNTER — Encounter: Payer: Self-pay | Admitting: Internal Medicine

## 2019-02-12 VITALS — BP 116/68 | HR 74 | Temp 98.2°F | Ht 67.0 in | Wt 184.0 lb

## 2019-02-12 DIAGNOSIS — R05 Cough: Secondary | ICD-10-CM

## 2019-02-12 DIAGNOSIS — I10 Essential (primary) hypertension: Secondary | ICD-10-CM

## 2019-02-12 DIAGNOSIS — R739 Hyperglycemia, unspecified: Secondary | ICD-10-CM | POA: Diagnosis not present

## 2019-02-12 DIAGNOSIS — I25810 Atherosclerosis of coronary artery bypass graft(s) without angina pectoris: Secondary | ICD-10-CM

## 2019-02-12 DIAGNOSIS — R059 Cough, unspecified: Secondary | ICD-10-CM

## 2019-02-12 MED ORDER — HYDROCODONE-HOMATROPINE 5-1.5 MG/5ML PO SYRP: 5.0000 mL | ORAL_SOLUTION | Freq: Four times a day (QID) | ORAL | 0 refills | Status: AC | PRN

## 2019-02-12 MED ORDER — AZITHROMYCIN 250 MG PO TABS: ORAL_TABLET | ORAL | 1 refills | Status: DC

## 2019-02-12 NOTE — Patient Instructions (Signed)
Please take all new medication as prescribed - the antibiotic, and cough medicine as needed  Please continue all other medications as before, and refills have been done if requested.  Please have the pharmacy call with any other refills you may need.  Please keep your appointments with your specialists as you may have planned   

## 2019-02-12 NOTE — Assessment & Plan Note (Signed)
stable overall by history and exam, recent data reviewed with pt, and pt to continue medical treatment as before,  to f/u any worsening symptoms or concerns  

## 2019-02-12 NOTE — Assessment & Plan Note (Signed)
Mild to mod, c/w bronchitis vs pna, declines cxr, for antibx course,  Cough med prn, to f/u any worsening symptoms or concerns 

## 2019-02-12 NOTE — Progress Notes (Signed)
Subjective:    Patient ID: Larry [name], male    DOB: 1932-02-08, 85 y.o.   MRN: [id number]  HPI  Here with acute onset mild to mod 2-3 days ST, HA, general weakness and malaise, with prod cough greenish sputum, but Pt denies chest pain, increased sob or doe, wheezing, orthopnea, PND, increased LE swelling, palpitations, dizziness or syncope. Pt denies new neurological symptoms such as new headache, or facial or extremity weakness or numbness, though conts to have significant but mild balance problem, walks with cane, no recent falls.   Pt denies polydipsia, polyuria Past Medical History:  Diagnosis Date  . Allergic rhinitis, cause unspecified 03/12/2013  . Allergy   . CAD (coronary artery disease)    S/P CABG in 1999   . Cataract   . Decreased exercise tolerance    Exertional fatigue  . Degenerative arthritis of hip   . Diverticulosis of colon   . ED (erectile dysfunction)   . Erectile dysfunction 06/03/2014  . History of colonic polyps   . History of kidney stones   . History of MI (myocardial infarction)   . Hx of adenomatous colonic polyps 2000  . Hyperlipidemia    Low HDL  . Lumbar disc disease    Hx of with recent back pain and buttock pain  . Myocardial infarction ([place]) 1999  . Right knee meniscal tear   . S/P removal of thyroid nodule   . Spinal stenosis of lumbar region 03/15/2014   Past Surgical History:  Procedure Laterality Date  . CARDIAC CATHETERIZATION    . CATARACT EXTRACTION    . COLONOSCOPY    . CORONARY ARTERY BYPASS GRAFT  1999  . CYSTOSCOPY/URETEROSCOPY/HOLMIUM LASER/STENT PLACEMENT Left 12/23/2017   Procedure: CYSTOSCOPY/RETROGRADE/URETEROSCOPY/HOLMIUM LASER/STENT PLACEMENT;  Surgeon: Festus Aloe, MD;  Location: [facility];  Service: Urology;  Laterality: Left;  ONLY NEEDS 60 MIN  . LUMBAR DISC SURGERY    . Nodules on Thyroid    . POLYPECTOMY    . PTCA    . SHOULDER ARTHROSCOPY WITH ROTATOR CUFF REPAIR AND SUBACROMIAL DECOMPRESSION Right 01/30/2015   Procedure: RIGHT ARTHROSCOPY SHOULDER SUBACROMIAL DECOMPRESSION DISTAL CLAVICAL RESECTION RETATOR CUFF REPAIR;  Surgeon: Justice Britain, MD;  Location: [facility];  Service: Orthopedics;  Laterality: Right;  . TONSILLECTOMY    . VASECTOMY      reports that he has never smoked. He has never used smokeless tobacco. He reports current alcohol use. He reports that he does not use drugs. family history includes Heart disease in his brother and mother; Prostate cancer in some other family members. Allergies  Allergen Reactions  . Beta Adrenergic Blockers Other (See Comments)    Dropped blood pressure too low  . Simvastatin Other (See Comments)    myalgia   Current Outpatient Medications on File Prior to Visit  Medication Sig Dispense Refill  . acetaminophen (TYLENOL) 650 MG CR tablet Take 650 mg by mouth every 8 (eight) hours as needed for pain.     . calcium-vitamin D (OSCAL WITH D) 500-200 MG-UNIT per tablet Take 1 tablet by mouth 2 (two) times daily.     . cetirizine (ZYRTEC) 10 MG tablet Take 10 mg by mouth daily.    . clopidogrel (PLAVIX) 75 MG tablet Take 1 tablet (75 mg total) by mouth daily. 90 tablet 3  . fish oil-omega-3 fatty acids 1000 MG capsule Take 1 g by mouth 2 (two) times daily.     . folic acid (FOLVITE) 509 MCG tablet Take 400 mcg by  mouth daily.      . Multiple Vitamin (MULTIVITAMIN WITH MINERALS) TABS tablet Take 1 tablet by mouth daily. Centrum Silver    . rosuvastatin (CRESTOR) 10 MG tablet Take 1 tablet daily alternating with half tablet daily. 75 tablet 3  . tamsulosin (FLOMAX) 0.4 MG CAPS capsule TAKE 1 CAPSULE BY MOUTH EVERY DAY 90 capsule 0   No current facility-administered medications on file prior to visit.    Review of Systems  Constitutional: Negative for other unusual diaphoresis or sweats HENT: Negative for ear discharge or swelling Eyes: Negative for other worsening visual disturbances Respiratory: Negative for stridor or other swelling  Gastrointestinal:  Negative for worsening distension or other blood Genitourinary: Negative for retention or other urinary change Musculoskeletal: Negative for other MSK pain or swelling Skin: Negative for color change or other new lesions Neurological: Negative for worsening tremors and other numbness  Psychiatric/Behavioral: Negative for worsening agitation or other fatigue All other system neg per pt    Objective:   Physical Exam BP 116/68   Pulse 74   Temp 98.2 F (36.8 C) (Oral)   Ht 5\' 7"  (1.702 m)   Wt 184 lb (83.5 kg)   SpO2 95%   BMI 28.82 kg/m  VS noted, mild ill Constitutional: Pt appears in NAD HENT: Head: NCAT.  Right Ear: External ear normal.  Left Ear: External ear normal.  Eyes: . Pupils are equal, round, and reactive to light. Conjunctivae and EOM are normal Bilat tm's with mild erythema.  Max sinus areas mild tender.  Pharynx with mild erythema, no exudate Nose: without d/c or deformity Neck: Neck supple. Gross normal ROM Cardiovascular: Normal rate and regular rhythm.   Pulmonary/Chest: Effort normal and breath sounds without rales or wheezing.  Neurological: Pt is alert. At baseline orientation, motor grossly intact Skin: Skin is warm. No rashes, other new lesions, no LE edema Psychiatric: Pt behavior is normal without agitation  No other exam findings Lab Results  Component Value Date   WBC 6.6 09/04/2018   HGB 16.8 09/04/2018   HCT 48.5 09/04/2018   PLT 232.0 09/04/2018   GLUCOSE 95 01/10/2019   CHOL 103 09/04/2018   TRIG 140.0 09/04/2018   HDL 30.20 (L) 09/04/2018   LDLDIRECT 91.5 02/13/2008   LDLCALC 45 09/04/2018   ALT 14 09/04/2018   AST 15 09/04/2018   NA 141 01/10/2019   K 4.3 01/10/2019   CL 102 01/10/2019   CREATININE 1.21 01/10/2019   BUN 16 01/10/2019   CO2 24 01/10/2019   TSH 1.92 09/04/2018   PSA 1.40 03/20/2015   INR 0.99 12/19/2017   HGBA1C 5.8 09/04/2018       Assessment & Plan:

## 2019-02-18 ENCOUNTER — Encounter: Payer: Self-pay | Admitting: Internal Medicine

## 2019-02-23 ENCOUNTER — Encounter: Payer: Self-pay | Admitting: Internal Medicine

## 2019-02-23 ENCOUNTER — Ambulatory Visit (INDEPENDENT_AMBULATORY_CARE_PROVIDER_SITE_OTHER): Payer: Medicare Other | Admitting: Internal Medicine

## 2019-02-23 ENCOUNTER — Ambulatory Visit (INDEPENDENT_AMBULATORY_CARE_PROVIDER_SITE_OTHER)
Admission: RE | Admit: 2019-02-23 | Discharge: 2019-02-23 | Disposition: A | Discharge status: Home / Self Care | Payer: Medicare Other | Source: Ambulatory Visit | Attending: Internal Medicine | Admitting: Internal Medicine

## 2019-02-23 VITALS — BP 118/78 | HR 92 | Temp 98.0°F | Ht 67.0 in | Wt 182.0 lb

## 2019-02-23 DIAGNOSIS — R05 Cough: Secondary | ICD-10-CM | POA: Diagnosis not present

## 2019-02-23 DIAGNOSIS — I10 Essential (primary) hypertension: Secondary | ICD-10-CM

## 2019-02-23 DIAGNOSIS — R059 Cough, unspecified: Secondary | ICD-10-CM

## 2019-02-23 DIAGNOSIS — R739 Hyperglycemia, unspecified: Secondary | ICD-10-CM

## 2019-02-23 DIAGNOSIS — I25810 Atherosclerosis of coronary artery bypass graft(s) without angina pectoris: Secondary | ICD-10-CM

## 2019-02-23 MED ORDER — AMOXICILLIN-POT CLAVULANATE 875-125 MG PO TABS: 1.0000 | ORAL_TABLET | Freq: Two times a day (BID) | ORAL | 0 refills | Status: DC

## 2019-02-23 NOTE — Assessment & Plan Note (Signed)
Mild to mod, c/w bronchitis vs pna, for cxr, also for antibx course, OTC cough med working ok, and to f/u any worsening symptoms or concerns

## 2019-02-23 NOTE — Telephone Encounter (Signed)
[place] for addon today - please contact pt

## 2019-02-23 NOTE — Assessment & Plan Note (Signed)
stable overall by history and exam, recent data reviewed with pt, and pt to continue medical treatment as before,  to f/u any worsening symptoms or concerns  

## 2019-02-23 NOTE — Progress Notes (Signed)
Subjective:    Patient ID: Larry [name], male    DOB: [birth year], 85 y.o.   MRN: [id number]  HPI  Here to f/u with 16 days c/o persistent prod cough, white sometimes colored but no blood, denies fever, wheezing, worsening sob, but cough keeps him up at night and tired the next day.  Wife may be developing same symptoms.  Not better with zpack.  Also assoc with some nasal congestion, but no HA, sinus pain, ST, or ear symptoms.  Pt denies chest pain, increased sob or doe, wheezing, orthopnea, PND, increased LE swelling, palpitations, dizziness or syncope.  Pt denies new neurological symptoms such as new headache, or facial or extremity weakness or numbness   Pt denies polydipsia, polyuria Past Medical History:  Diagnosis Date  . Allergic rhinitis, cause unspecified 03/12/2013  . Allergy   . CAD (coronary artery disease)    S/P CABG in 1999   . Cataract   . Decreased exercise tolerance    Exertional fatigue  . Degenerative arthritis of hip   . Diverticulosis of colon   . ED (erectile dysfunction)   . Erectile dysfunction 06/03/2014  . History of colonic polyps   . History of kidney stones   . History of MI (myocardial infarction)   . Hx of adenomatous colonic polyps 2000  . Hyperlipidemia    Low HDL  . Lumbar disc disease    Hx of with recent back pain and buttock pain  . Myocardial infarction ([place]) 1999  . Right knee meniscal tear   . S/P removal of thyroid nodule   . Spinal stenosis of lumbar region 03/15/2014   Past Surgical History:  Procedure Laterality Date  . CARDIAC CATHETERIZATION    . CATARACT EXTRACTION    . COLONOSCOPY    . CORONARY ARTERY BYPASS GRAFT  1999  . CYSTOSCOPY/URETEROSCOPY/HOLMIUM LASER/STENT PLACEMENT Left 12/23/2017   Procedure: CYSTOSCOPY/RETROGRADE/URETEROSCOPY/HOLMIUM LASER/STENT PLACEMENT;  Surgeon: Festus Aloe, MD;  Location: [facility];  Service: Urology;  Laterality: Left;  ONLY NEEDS 60 MIN  . LUMBAR DISC SURGERY    . Nodules on Thyroid    .  POLYPECTOMY    . PTCA    . SHOULDER ARTHROSCOPY WITH ROTATOR CUFF REPAIR AND SUBACROMIAL DECOMPRESSION Right 01/30/2015   Procedure: RIGHT ARTHROSCOPY SHOULDER SUBACROMIAL DECOMPRESSION DISTAL CLAVICAL RESECTION RETATOR CUFF REPAIR;  Surgeon: Justice Britain, MD;  Location: [facility];  Service: Orthopedics;  Laterality: Right;  . TONSILLECTOMY    . VASECTOMY      reports that he has never smoked. He has never used smokeless tobacco. He reports current alcohol use. He reports that he does not use drugs. family history includes Heart disease in his brother and mother; Prostate cancer in some other family members. Allergies  Allergen Reactions  . Beta Adrenergic Blockers Other (See Comments)    Dropped blood pressure too low  . Simvastatin Other (See Comments)    myalgia   Current Outpatient Medications on File Prior to Visit  Medication Sig Dispense Refill  . acetaminophen (TYLENOL) 650 MG CR tablet Take 650 mg by mouth every 8 (eight) hours as needed for pain.     Marland Kitchen azithromycin (ZITHROMAX Z-PAK) 250 MG tablet 2 tab by mouth day 1, then 1 per day 6 tablet 1  . calcium-vitamin D (OSCAL WITH D) 500-200 MG-UNIT per tablet Take 1 tablet by mouth 2 (two) times daily.     . cetirizine (ZYRTEC) 10 MG tablet Take 10 mg by mouth daily.    . clopidogrel (  PLAVIX) 75 MG tablet Take 1 tablet (75 mg total) by mouth daily. 90 tablet 3  . fish oil-omega-3 fatty acids 1000 MG capsule Take 1 g by mouth 2 (two) times daily.     . folic acid (FOLVITE) 176 MCG tablet Take 400 mcg by mouth daily.      . Multiple Vitamin (MULTIVITAMIN WITH MINERALS) TABS tablet Take 1 tablet by mouth daily. Centrum Silver    . rosuvastatin (CRESTOR) 10 MG tablet Take 1 tablet daily alternating with half tablet daily. 75 tablet 3  . tamsulosin (FLOMAX) 0.4 MG CAPS capsule TAKE 1 CAPSULE BY MOUTH EVERY DAY 90 capsule 0   No current facility-administered medications on file prior to visit.    Review of Systems  Constitutional: Negative  for other unusual diaphoresis or sweats HENT: Negative for ear discharge or swelling Eyes: Negative for other worsening visual disturbances Respiratory: Negative for stridor or other swelling  Gastrointestinal: Negative for worsening distension or other blood Genitourinary: Negative for retention or other urinary change Musculoskeletal: Negative for other MSK pain or swelling Skin: Negative for color change or other new lesions Neurological: Negative for worsening tremors and other numbness  Psychiatric/Behavioral: Negative for worsening agitation or other fatigue All other system neg per pt    Objective:   Physical Exam BP 118/78   Pulse 92   Temp 98 F (36.7 C) (Oral)   Ht 5\' 7"  (1.702 m)   Wt 182 lb (82.6 kg)   SpO2 96%   BMI 28.51 kg/m  VS noted,  Constitutional: Pt appears in NAD HENT: Head: NCAT.  Right Ear: External ear normal.  Left Ear: External ear normal.  Eyes: . Pupils are equal, round, and reactive to light. Conjunctivae and EOM are normal Nose: without d/c or deformity Neck: Neck supple. Gross normal ROM Cardiovascular: Normal rate and regular rhythm.   Pulmonary/Chest: Effort normal and breath sounds decreased without rales but with course BS and right lung trace wheezing.  Abd:  Soft, NT, ND, + BS, no organomegaly Neurological: Pt is alert. At baseline orientation, motor grossly intact Skin: Skin is warm. No rashes, other new lesions, no LE edema Psychiatric: Pt behavior is normal without agitation  No other exam findings Lab Results  Component Value Date   WBC 6.6 09/04/2018   HGB 16.8 09/04/2018   HCT 48.5 09/04/2018   PLT 232.0 09/04/2018   GLUCOSE 95 01/10/2019   CHOL 103 09/04/2018   TRIG 140.0 09/04/2018   HDL 30.20 (L) 09/04/2018   LDLDIRECT 91.5 02/13/2008   LDLCALC 45 09/04/2018   ALT 14 09/04/2018   AST 15 09/04/2018   NA 141 01/10/2019   K 4.3 01/10/2019   CL 102 01/10/2019   CREATININE 1.21 01/10/2019   BUN 16 01/10/2019   CO2 24  01/10/2019   TSH 1.92 09/04/2018   PSA 1.40 03/20/2015   INR 0.99 12/19/2017   HGBA1C 5.8 09/04/2018       Assessment & Plan:

## 2019-02-23 NOTE — Patient Instructions (Signed)
Please take all new medication as prescribed  - the antibiotic  Please continue all other medications as before, and refills have been done if requested.  Please have the pharmacy call with any other refills you may need.  Please keep your appointments with your specialists as you may have planned  Please go to the XRAY Department in the Basement (go straight as you get off the elevator) for the x-ray testing  You will be contacted by phone if any changes need to be made immediately.  Otherwise, you will receive a letter about your results with an explanation, but please check with MyChart first.  Please remember to sign up for MyChart if you have not done so, as this will be important to you in the future with finding out test results, communicating by private email, and scheduling acute appointments online when needed.    

## 2019-04-30 ENCOUNTER — Other Ambulatory Visit: Payer: Self-pay | Admitting: Internal Medicine

## 2019-05-08 DIAGNOSIS — L570 Actinic keratosis: Secondary | ICD-10-CM | POA: Diagnosis not present

## 2019-05-08 DIAGNOSIS — L821 Other seborrheic keratosis: Secondary | ICD-10-CM | POA: Diagnosis not present

## 2019-05-08 DIAGNOSIS — L814 Other melanin hyperpigmentation: Secondary | ICD-10-CM | POA: Diagnosis not present

## 2019-05-08 DIAGNOSIS — L820 Inflamed seborrheic keratosis: Secondary | ICD-10-CM | POA: Diagnosis not present

## 2019-05-08 DIAGNOSIS — D225 Melanocytic nevi of trunk: Secondary | ICD-10-CM | POA: Diagnosis not present

## 2019-05-08 DIAGNOSIS — Z85828 Personal history of other malignant neoplasm of skin: Secondary | ICD-10-CM | POA: Diagnosis not present

## 2019-05-09 ENCOUNTER — Encounter: Payer: Self-pay | Admitting: Internal Medicine

## 2019-05-15 DIAGNOSIS — M4316 Spondylolisthesis, lumbar region: Secondary | ICD-10-CM | POA: Diagnosis not present

## 2019-05-15 DIAGNOSIS — Z6827 Body mass index (BMI) 27.0-27.9, adult: Secondary | ICD-10-CM | POA: Diagnosis not present

## 2019-05-15 DIAGNOSIS — M48062 Spinal stenosis, lumbar region with neurogenic claudication: Secondary | ICD-10-CM | POA: Diagnosis not present

## 2019-05-15 DIAGNOSIS — M4155 Other secondary scoliosis, thoracolumbar region: Secondary | ICD-10-CM | POA: Diagnosis not present

## 2019-05-15 DIAGNOSIS — R030 Elevated blood-pressure reading, without diagnosis of hypertension: Secondary | ICD-10-CM | POA: Diagnosis not present

## 2019-05-15 DIAGNOSIS — M5136 Other intervertebral disc degeneration, lumbar region: Secondary | ICD-10-CM | POA: Diagnosis not present

## 2019-05-15 DIAGNOSIS — M47816 Spondylosis without myelopathy or radiculopathy, lumbar region: Secondary | ICD-10-CM | POA: Diagnosis not present

## 2019-07-22 ENCOUNTER — Other Ambulatory Visit: Payer: Self-pay | Admitting: Internal Medicine

## 2019-07-26 ENCOUNTER — Encounter: Payer: Self-pay | Admitting: Cardiovascular Disease

## 2019-07-26 ENCOUNTER — Ambulatory Visit (INDEPENDENT_AMBULATORY_CARE_PROVIDER_SITE_OTHER): Payer: Medicare Other | Admitting: Cardiovascular Disease

## 2019-07-26 ENCOUNTER — Other Ambulatory Visit: Payer: Self-pay

## 2019-07-26 VITALS — BP 118/70 | HR 68 | Ht 67.0 in | Wt 178.4 lb

## 2019-07-26 DIAGNOSIS — I25810 Atherosclerosis of coronary artery bypass graft(s) without angina pectoris: Secondary | ICD-10-CM | POA: Diagnosis not present

## 2019-07-26 DIAGNOSIS — E7800 Pure hypercholesterolemia, unspecified: Secondary | ICD-10-CM | POA: Diagnosis not present

## 2019-07-26 DIAGNOSIS — I493 Ventricular premature depolarization: Secondary | ICD-10-CM | POA: Diagnosis not present

## 2019-07-26 NOTE — Patient Instructions (Signed)

## 2019-07-26 NOTE — Progress Notes (Signed)
Chief Complaint  Patient presents with  . Follow-up    CAD   History of Present Illness: 84 yo male with history of CAD s/p 3V CABG in 1999, HLD and PVCs here for cardiac follow up. He had 3V CABG in 1999 at [facility] in [place]. In 2008 he had a catheterization and was found to have a patent LIMA to LAD and a patent vein graft to the right coronary and no significant obstruction in the circumflex artery. Exercise stress test April 2015 with no ischemia. He has had PVCs noted on cardiac monitor and was started on Cardizem but he did not tolerate the Cardizem due to dizziness so it was stopped. Echo January 2020 with LVEF=50-55%, mild AI and mildly dilated aortic root. Chest CTA January 2020 with 4.4 cm ascending aortic aneurysm.    He is here today for follow up. The patient denies any chest pain, dyspnea, palpitations, lower extremity edema, orthopnea, PND, dizziness, near syncope or syncope.   Primary Care Physician: Biagio Borg, MD   Past Medical History:  Diagnosis Date  . Allergic rhinitis, cause unspecified 03/12/2013  . Allergy   . CAD (coronary artery disease)    S/P CABG in 1999   . Cataract   . Decreased exercise tolerance    Exertional fatigue  . Degenerative arthritis of hip   . Diverticulosis of colon   . ED (erectile dysfunction)   . Erectile dysfunction 06/03/2014  . History of colonic polyps   . History of kidney stones   . History of MI (myocardial infarction)   . Hx of adenomatous colonic polyps 2000  . Hyperlipidemia    Low HDL  . Lumbar disc disease    Hx of with recent back pain and buttock pain  . Myocardial infarction ([place]) 1999  . Right knee meniscal tear   . S/P removal of thyroid nodule   . Spinal stenosis of lumbar region 03/15/2014    Past Surgical History:  Procedure Laterality Date  . CARDIAC CATHETERIZATION    . CATARACT EXTRACTION    . COLONOSCOPY    . CORONARY ARTERY BYPASS GRAFT  1999  . CYSTOSCOPY/URETEROSCOPY/HOLMIUM  LASER/STENT PLACEMENT Left 12/23/2017   Procedure: CYSTOSCOPY/RETROGRADE/URETEROSCOPY/HOLMIUM LASER/STENT PLACEMENT;  Surgeon: Festus Aloe, MD;  Location: [facility];  Service: Urology;  Laterality: Left;  ONLY NEEDS 60 MIN  . LUMBAR DISC SURGERY    . Nodules on Thyroid    . POLYPECTOMY    . PTCA    . SHOULDER ARTHROSCOPY WITH ROTATOR CUFF REPAIR AND SUBACROMIAL DECOMPRESSION Right 01/30/2015   Procedure: RIGHT ARTHROSCOPY SHOULDER SUBACROMIAL DECOMPRESSION DISTAL CLAVICAL RESECTION RETATOR CUFF REPAIR;  Surgeon: Justice Britain, MD;  Location: [facility];  Service: Orthopedics;  Laterality: Right;  . TONSILLECTOMY    . VASECTOMY      Current Outpatient Medications  Medication Sig Dispense Refill  . acetaminophen (TYLENOL) 650 MG CR tablet Take 650 mg by mouth every 8 (eight) hours as needed for pain.     . calcium-vitamin D (OSCAL WITH D) 500-200 MG-UNIT per tablet Take 1 tablet by mouth 2 (two) times daily.     . cetirizine (ZYRTEC) 10 MG tablet Take 10 mg by mouth daily.    . clopidogrel (PLAVIX) 75 MG tablet Take 1 tablet (75 mg total) by mouth daily. 90 tablet 3  . fish oil-omega-3 fatty acids 1000 MG capsule Take 1 g by mouth 2 (two) times daily.     . folic acid (FOLVITE) 878 MCG  tablet Take 400 mcg by mouth daily.      . Multiple Vitamin (MULTIVITAMIN WITH MINERALS) TABS tablet Take 1 tablet by mouth daily. Centrum Silver    . rosuvastatin (CRESTOR) 10 MG tablet Take 1 tablet daily alternating with half tablet daily. 75 tablet 3  . tamsulosin (FLOMAX) 0.4 MG CAPS capsule TAKE 1 CAPSULE BY MOUTH EVERY DAY 90 capsule 0   No current facility-administered medications for this visit.     Allergies  Allergen Reactions  . Beta Adrenergic Blockers Other (See Comments)    Dropped blood pressure too low  . Simvastatin Other (See Comments)    myalgia    Social History   Socioeconomic History  . Marital status: Married    Spouse name: Not on file  . Number of children: 2  . Years of  education: Not on file  . Highest education level: Not on file  Occupational History  . 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 Financial resource strain: Not hard at all  . Food insecurity    Worry: Never true    Inability: Never true  . Transportation needs    Medical: No    Non-medical: No  Tobacco Use  . Smoking status: Never Smoker  . Smokeless tobacco: Never Used  Substance and Sexual Activity  . Alcohol use: Yes    Alcohol/week: 0.0 standard drinks    Comment: rare  . Drug use: No  . Sexual activity: Not Currently  Lifestyle  . Physical activity    Days per week: 5 days    Minutes per session: 50 min  . Stress: Not at all  Relationships  . Social connections    Talks on phone: More than three times a week    Gets together: More than three times a week    Attends religious service: 1 to 4 times per year    Active member of club or organization: Yes    Attends meetings of clubs or organizations: More than 4 times per year    Relationship status: Married  . Intimate partner violence    Fear of current or ex partner: No    Emotionally abused: No    Physically abused: No    Forced sexual activity: No  Other Topics Concern  . Not on file  Social History Narrative   Moved from [facility]   Married      [employer] on file    Family History  Problem Relation Age of Onset  . Prostate cancer Other   . Prostate cancer Other   . Heart disease Mother   . Heart disease Brother   . Colon cancer Neg Hx   . Rectal cancer Neg Hx   . Stomach cancer Neg Hx     Review of Systems:  As stated in the HPI and otherwise negative.   BP 118/70   Pulse 68   Ht 5\' 7"  (1.702 m)   Wt 178 lb 6.4 oz (80.9 kg)   SpO2 97%   BMI 27.94 kg/m   Physical Examination:  General: Well developed, well nourished, NAD  HEENT: OP clear, mucus membranes moist  SKIN: warm, dry. No rashes. Neuro: No focal deficits  Musculoskeletal:  Muscle strength 5/5 all ext  Psychiatric: Mood and affect normal  Neck: No JVD, no carotid bruits, no thyromegaly, no lymphadenopathy.  Lungs:Clear bilaterally, no wheezes, rhonci, crackles Cardiovascular: Regular rate and rhythm. No murmurs, gallops or rubs.  Abdomen:Soft. Bowel sounds present. Non-tender.  Extremities: No lower extremity edema. Pulses are 2 + in the bilateral DP/PT.  Echo January 2020: - Left ventricle: The cavity size was normal. Wall thickness was   increased in a pattern of mild LVH. Systolic function was normal.   The estimated ejection fraction was in the range of 50% to 55%.   Wall motion was normal; there were no regional wall motion   abnormalities. Doppler parameters are consistent with abnormal   left ventricular relaxation (grade 1 diastolic dysfunction). - Aortic valve: There was mild regurgitation. - Aortic root: The aortic root was mildly dilated. - Ascending aorta: The ascending aorta was moderately dilated.  Impressions:  - Low normal LV systolic function; mild LVH; mild diastolic   dysfunction; calcified aortic valve with mild AI; moderately   dilated ascending aorta (suggest CTA or MRA to further assess). EKG:  EKG is ordered today. The ekg ordered today demonstrates  Sinus rhythm, rate 74 bpm. PAC.   Recent Labs: 09/04/2018: ALT 14; Hemoglobin 16.8; Platelets 232.0; TSH 1.92 01/10/2019: BUN 16; Creatinine, Ser 1.21; Potassium 4.3; Sodium 141   Lipid Panel    Component Value Date/Time   CHOL 103 09/04/2018 1018   CHOL 121 05/23/2018 0939   CHOL 118 04/03/2014 0843   TRIG 140.0 09/04/2018 1018   TRIG 162 (H) 04/03/2014 0843   HDL 30.20 (L) 09/04/2018 1018   HDL 34 (L) 05/23/2018 0939   HDL 36 (L) 04/03/2014 0843   CHOLHDL 3 09/04/2018 1018   VLDL 28.0 09/04/2018 1018   LDLCALC 45 09/04/2018 1018   LDLCALC 61 05/23/2018 0939   LDLCALC 50 04/03/2014 0843   LDLDIRECT 91.5 02/13/2008 1400     Wt Readings from Last 3 Encounters:   07/26/19 178 lb 6.4 oz (80.9 kg)  02/23/19 182 lb (82.6 kg)  02/12/19 184 lb (83.5 kg)     Other studies Reviewed: Additional studies/ records that were reviewed today include: . Review of the above records demonstrates:    Assessment and Plan:   1. CAD s/p CABG without angina: No chest pain. He is very active. LV function normal by echo in January 2020. Continue Plavix and statin.    2. PVCs: No awareness of palpitations. He did not tolerate Cardizem in the past.   3. HLD: LDL at goal in 2019. Continue statin. Repeat lipids and LFTs now.   He does not wish to schedule a stress test at this time since he may not proceed with the back surgery. He will call with changes.   Current medicines are reviewed at length with the patient today.  The patient does not have concerns regarding medicines.  The following changes have been made:  no change  Labs/ tests ordered today include:   No orders of the defined types were placed in this encounter.   Disposition:   FU with me in 6 months  Signed, Lauree Chandler, MD 07/26/2019 3:02 PM    [facility] [place], [place], [place]  [address] Phone: [phone]; Fax: [phone]

## 2019-08-12 ENCOUNTER — Other Ambulatory Visit: Payer: Self-pay | Admitting: Cardiovascular Disease

## 2019-08-17 DIAGNOSIS — N401 Enlarged prostate with lower urinary tract symptoms: Secondary | ICD-10-CM | POA: Diagnosis not present

## 2019-08-17 DIAGNOSIS — R351 Nocturia: Secondary | ICD-10-CM | POA: Diagnosis not present

## 2019-08-17 DIAGNOSIS — N281 Cyst of kidney, acquired: Secondary | ICD-10-CM | POA: Diagnosis not present

## 2019-08-17 DIAGNOSIS — N5201 Erectile dysfunction due to arterial insufficiency: Secondary | ICD-10-CM | POA: Diagnosis not present

## 2019-09-07 ENCOUNTER — Other Ambulatory Visit (INDEPENDENT_AMBULATORY_CARE_PROVIDER_SITE_OTHER): Payer: Medicare Other

## 2019-09-07 ENCOUNTER — Encounter: Payer: Self-pay | Admitting: Internal Medicine

## 2019-09-07 ENCOUNTER — Ambulatory Visit (INDEPENDENT_AMBULATORY_CARE_PROVIDER_SITE_OTHER): Payer: Medicare Other | Admitting: Internal Medicine

## 2019-09-07 ENCOUNTER — Other Ambulatory Visit: Payer: Self-pay

## 2019-09-07 VITALS — BP 124/82 | HR 96 | Temp 97.9°F | Ht 67.0 in | Wt 177.0 lb

## 2019-09-07 DIAGNOSIS — E611 Iron deficiency: Secondary | ICD-10-CM

## 2019-09-07 DIAGNOSIS — E7800 Pure hypercholesterolemia, unspecified: Secondary | ICD-10-CM | POA: Diagnosis not present

## 2019-09-07 DIAGNOSIS — E559 Vitamin D deficiency, unspecified: Secondary | ICD-10-CM

## 2019-09-07 DIAGNOSIS — R739 Hyperglycemia, unspecified: Secondary | ICD-10-CM | POA: Diagnosis not present

## 2019-09-07 DIAGNOSIS — Z23 Encounter for immunization: Secondary | ICD-10-CM | POA: Diagnosis not present

## 2019-09-07 DIAGNOSIS — I10 Essential (primary) hypertension: Secondary | ICD-10-CM

## 2019-09-07 DIAGNOSIS — E538 Deficiency of other specified B group vitamins: Secondary | ICD-10-CM

## 2019-09-07 DIAGNOSIS — I25810 Atherosclerosis of coronary artery bypass graft(s) without angina pectoris: Secondary | ICD-10-CM

## 2019-09-07 LAB — URINALYSIS, ROUTINE W REFLEX MICROSCOPIC
Bilirubin Urine: NEGATIVE
Hgb urine dipstick: NEGATIVE
Ketones, ur: NEGATIVE
Leukocytes,Ua: NEGATIVE
Nitrite: NEGATIVE
RBC / HPF: NONE SEEN (ref 0–?)
Specific Gravity, Urine: 1.025 (ref 1.000–1.030)
Total Protein, Urine: NEGATIVE
Urine Glucose: NEGATIVE
Urobilinogen, UA: 0.2 (ref 0.0–1.0)
WBC, UA: NONE SEEN (ref 0–?)
pH: 6 (ref 5.0–8.0)

## 2019-09-07 LAB — CBC WITH DIFFERENTIAL/PLATELET
Basophils Absolute: 0 10*3/uL (ref 0.0–0.1)
Basophils Relative: 0.6 % (ref 0.0–3.0)
Eosinophils Absolute: 0.2 10*3/uL (ref 0.0–0.7)
Eosinophils Relative: 2.6 % (ref 0.0–5.0)
HCT: 50.4 % (ref 39.0–52.0)
Hemoglobin: 17.2 g/dL — ABNORMAL HIGH (ref 13.0–17.0)
Lymphocytes Relative: 27.8 % (ref 12.0–46.0)
Lymphs Abs: 2.3 10*3/uL (ref 0.7–4.0)
MCHC: 34.2 g/dL (ref 30.0–36.0)
MCV: 91 fl (ref 78.0–100.0)
Monocytes Absolute: 0.9 10*3/uL (ref 0.1–1.0)
Monocytes Relative: 11.1 % (ref 3.0–12.0)
Neutro Abs: 4.8 10*3/uL (ref 1.4–7.7)
Neutrophils Relative %: 57.9 % (ref 43.0–77.0)
Platelets: 235 10*3/uL (ref 150.0–400.0)
RBC: 5.54 Mil/uL (ref 4.22–5.81)
RDW: 13.6 % (ref 11.5–15.5)
WBC: 8.2 10*3/uL (ref 4.0–10.5)

## 2019-09-07 LAB — LIPID PANEL
Cholesterol: 114 mg/dL (ref 0–200)
HDL: 32.5 mg/dL — ABNORMAL LOW (ref >39.00)
LDL Cholesterol: 41 mg/dL (ref 0–99)
NonHDL: 81.13
Total CHOL/HDL Ratio: 3
Triglycerides: 199 mg/dL — ABNORMAL HIGH (ref 0.0–149.0)
VLDL: 39.8 mg/dL (ref 0.0–40.0)

## 2019-09-07 LAB — IBC PANEL
Iron: 100 ug/dL (ref 42–165)
Saturation Ratios: 32.6 % (ref 20.0–50.0)
Transferrin: 219 mg/dL (ref 212.0–360.0)

## 2019-09-07 LAB — BASIC METABOLIC PANEL
BUN: 21 mg/dL (ref 6–23)
CO2: 28 mEq/L (ref 19–32)
Calcium: 9.8 mg/dL (ref 8.4–10.5)
Chloride: 105 mEq/L (ref 96–112)
Creatinine, Ser: 1.2 mg/dL (ref 0.40–1.50)
GFR: 57.57 mL/min — ABNORMAL LOW (ref >60.00)
Glucose, Bld: 105 mg/dL — ABNORMAL HIGH (ref 70–99)
Potassium: 4 mEq/L (ref 3.5–5.1)
Sodium: 140 mEq/L (ref 135–145)

## 2019-09-07 LAB — HEPATIC FUNCTION PANEL
ALT: 16 U/L (ref 0–53)
AST: 19 U/L (ref 0–37)
Albumin: 4.4 g/dL (ref 3.5–5.2)
Alkaline Phosphatase: 37 U/L — ABNORMAL LOW (ref 39–117)
Bilirubin, Direct: 0.1 mg/dL (ref 0.0–0.3)
Total Bilirubin: 1 mg/dL (ref 0.2–1.2)
Total Protein: 7.5 g/dL (ref 6.0–8.3)

## 2019-09-07 LAB — TSH: TSH: 1.97 u[IU]/mL (ref 0.35–4.50)

## 2019-09-07 LAB — VITAMIN B12: Vitamin B-12: 580 pg/mL (ref 211–911)

## 2019-09-07 LAB — HEMOGLOBIN A1C: Hgb A1c MFr Bld: 5.6 % (ref 4.6–6.5)

## 2019-09-07 LAB — VITAMIN D 25 HYDROXY (VIT D DEFICIENCY, FRACTURES): VITD: 44.35 ng/mL (ref 30.00–100.00)

## 2019-09-07 MED ORDER — ROSUVASTATIN CALCIUM 5 MG PO TABS: 5.0000 mg | ORAL_TABLET | Freq: Two times a day (BID) | ORAL | 3 refills | Status: DC

## 2019-09-07 NOTE — Progress Notes (Signed)
Subjective:    Patient ID: Larry [name], male    DOB: 08-Dec-1932, 85 y.o.   MRN: [id number]  HPI  Here for yearly f/u;  Overall doing ok;  Pt denies Chest pain, worsening SOB, DOE, wheezing, orthopnea, PND, worsening LE edema, palpitations, dizziness or syncope.  Pt denies neurological change such as new headache, facial or extremity weakness.  Pt denies polydipsia, polyuria, or low sugar symptoms. Pt states overall good compliance with treatment and medications, good tolerability, and has been trying to follow appropriate diet.  Pt denies worsening depressive symptoms, suicidal ideation or panic. No fever, night sweats, wt loss, loss of appetite, or other constitutional symptoms.  Pt states good ability with ADL's, has low fall risk, home safety reviewed and adequate, no other significant changes in hearing or vision, and not active with exercise.  No new complaints Past Medical History:  Diagnosis Date  . Allergic rhinitis, cause unspecified 03/12/2013  . Allergy   . CAD (coronary artery disease)    S/P CABG in 1999   . Cataract   . Decreased exercise tolerance    Exertional fatigue  . Degenerative arthritis of hip   . Diverticulosis of colon   . ED (erectile dysfunction)   . Erectile dysfunction 06/03/2014  . History of colonic polyps   . History of kidney stones   . History of MI (myocardial infarction)   . Hx of adenomatous colonic polyps 2000  . Hyperlipidemia    Low HDL  . Lumbar disc disease    Hx of with recent back pain and buttock pain  . Myocardial infarction ([place]) 1999  . Right knee meniscal tear   . S/P removal of thyroid nodule   . Spinal stenosis of lumbar region 03/15/2014   Past Surgical History:  Procedure Laterality Date  . CARDIAC CATHETERIZATION    . CATARACT EXTRACTION    . COLONOSCOPY    . CORONARY ARTERY BYPASS GRAFT  1999  . CYSTOSCOPY/URETEROSCOPY/HOLMIUM LASER/STENT PLACEMENT Left 12/23/2017   Procedure: CYSTOSCOPY/RETROGRADE/URETEROSCOPY/HOLMIUM  LASER/STENT PLACEMENT;  Surgeon: Festus Aloe, MD;  Location: [facility];  Service: Urology;  Laterality: Left;  ONLY NEEDS 60 MIN  . LUMBAR DISC SURGERY    . Nodules on Thyroid    . POLYPECTOMY    . PTCA    . SHOULDER ARTHROSCOPY WITH ROTATOR CUFF REPAIR AND SUBACROMIAL DECOMPRESSION Right 01/30/2015   Procedure: RIGHT ARTHROSCOPY SHOULDER SUBACROMIAL DECOMPRESSION DISTAL CLAVICAL RESECTION RETATOR CUFF REPAIR;  Surgeon: Justice Britain, MD;  Location: [facility];  Service: Orthopedics;  Laterality: Right;  . TONSILLECTOMY    . VASECTOMY      reports that he has never smoked. He has never used smokeless tobacco. He reports current alcohol use. He reports that he does not use drugs. family history includes Heart disease in his brother and mother; Prostate cancer in some other family members. Allergies  Allergen Reactions  . Beta Adrenergic Blockers Other (See Comments)    Dropped blood pressure too low  . Simvastatin Other (See Comments)    myalgia   Current Outpatient Medications on File Prior to Visit  Medication Sig Dispense Refill  . acetaminophen (TYLENOL) 650 MG CR tablet Take 650 mg by mouth every 8 (eight) hours as needed for pain.     . calcium-vitamin D (OSCAL WITH D) 500-200 MG-UNIT per tablet Take 1 tablet by mouth 2 (two) times daily.     . cetirizine (ZYRTEC) 10 MG tablet Take 10 mg by mouth daily.    . clopidogrel (PLAVIX)  75 MG tablet TAKE 1 TABLET BY MOUTH EVERY DAY 90 tablet 3  . fish oil-omega-3 fatty acids 1000 MG capsule Take 1 g by mouth 2 (two) times daily.     . folic acid (FOLVITE) A999333 MCG tablet Take 400 mcg by mouth daily.      . Multiple Vitamin (MULTIVITAMIN WITH MINERALS) TABS tablet Take 1 tablet by mouth daily. Centrum Silver    . tamsulosin (FLOMAX) 0.4 MG CAPS capsule TAKE 1 CAPSULE BY MOUTH EVERY DAY 90 capsule 0   No current facility-administered medications on file prior to visit.    Review of Systems  Constitutional: Negative for other unusual  diaphoresis or sweats HENT: Negative for ear discharge or swelling Eyes: Negative for other worsening visual disturbances Respiratory: Negative for stridor or other swelling  Gastrointestinal: Negative for worsening distension or other blood Genitourinary: Negative for retention or other urinary change Musculoskeletal: Negative for other MSK pain or swelling Skin: Negative for color change or other new lesions Neurological: Negative for worsening tremors and other numbness  Psychiatric/Behavioral: Negative for worsening agitation or other fatigue All otherwise neg per pt    Objective:   Physical Exam BP 124/82   Pulse 96   Temp 97.9 F (36.6 C) (Oral)   Ht 5\' 7"  (1.702 m)   Wt 177 lb (80.3 kg)   SpO2 94%   BMI 27.72 kg/m  VS noted,  Constitutional: Pt appears in NAD HENT: Head: NCAT.  Right Ear: External ear normal.  Left Ear: External ear normal.  Eyes: . Pupils are equal, round, and reactive to light. Conjunctivae and EOM are normal Nose: without d/c or deformity Neck: Neck supple. Gross normal ROM Cardiovascular: Normal rate and regular rhythm.   Pulmonary/Chest: Effort normal and breath sounds without rales or wheezing.  Abd:  Soft, NT, ND, + BS, no organomegaly Neurological: Pt is alert. At baseline orientation, motor grossly intact Skin: Skin is warm. No rashes, other new lesions, no LE edema Psychiatric: Pt behavior is normal without agitation  All otherwise neg per pt  Lab Results  Component Value Date   WBC 8.2 09/07/2019   HGB 17.2 (H) 09/07/2019   HCT 50.4 09/07/2019   PLT 235.0 09/07/2019   GLUCOSE 105 (H) 09/07/2019   CHOL 114 09/07/2019   TRIG 199.0 (H) 09/07/2019   HDL 32.50 (L) 09/07/2019   LDLDIRECT 91.5 02/13/2008   LDLCALC 41 09/07/2019   ALT 16 09/07/2019   AST 19 09/07/2019   NA 140 09/07/2019   K 4.0 09/07/2019   CL 105 09/07/2019   CREATININE 1.20 09/07/2019   BUN 21 09/07/2019   CO2 28 09/07/2019   TSH 1.97 09/07/2019   PSA 1.40  03/20/2015   INR 0.99 12/19/2017   HGBA1C 5.6 09/07/2019      Assessment & Plan:

## 2019-09-07 NOTE — Patient Instructions (Addendum)

## 2019-09-08 ENCOUNTER — Encounter: Payer: Self-pay | Admitting: Internal Medicine

## 2019-09-08 NOTE — Assessment & Plan Note (Signed)
stable overall by history and exam, recent data reviewed with pt, and pt to continue medical treatment as before,  to f/u any worsening symptoms or concerns  

## 2019-09-08 NOTE — Assessment & Plan Note (Signed)
stable overall by history and exam, recent data reviewed with pt, and pt to continue medical treatment as before,  to f/u any worsening symptoms or concerns, for a1c 

## 2019-10-15 ENCOUNTER — Other Ambulatory Visit: Payer: Self-pay | Admitting: Internal Medicine

## 2020-01-11 ENCOUNTER — Other Ambulatory Visit: Payer: Self-pay | Admitting: Cardiovascular Disease

## 2020-02-22 ENCOUNTER — Encounter: Payer: Self-pay | Admitting: Cardiovascular Disease

## 2020-02-22 ENCOUNTER — Other Ambulatory Visit: Payer: Self-pay

## 2020-02-22 ENCOUNTER — Ambulatory Visit (INDEPENDENT_AMBULATORY_CARE_PROVIDER_SITE_OTHER): Payer: Medicare Other | Admitting: Cardiovascular Disease

## 2020-02-22 VITALS — BP 116/62 | HR 64 | Ht 67.0 in | Wt 178.8 lb

## 2020-02-22 DIAGNOSIS — I25810 Atherosclerosis of coronary artery bypass graft(s) without angina pectoris: Secondary | ICD-10-CM | POA: Diagnosis not present

## 2020-02-22 DIAGNOSIS — I493 Ventricular premature depolarization: Secondary | ICD-10-CM

## 2020-02-22 DIAGNOSIS — E7800 Pure hypercholesterolemia, unspecified: Secondary | ICD-10-CM

## 2020-02-22 NOTE — Patient Instructions (Signed)

## 2020-02-22 NOTE — Progress Notes (Signed)
Chief Complaint  Patient presents with  . Follow-up    CAD   History of Present Illness: 86 yo male with history of CAD s/p 3V CABG in 1999, HLD and PVCs here for cardiac follow up. He had 3V CABG in 1999 at [facility] in [place]. In 2008 he had a catheterization and was found to have a patent LIMA to LAD and a patent vein graft to the right coronary and no significant obstruction in the circumflex artery. Exercise stress test April 2015 with no ischemia. He has had PVCs noted on cardiac monitor and was started on Cardizem but he did not tolerate the Cardizem due to dizziness so it was stopped. Echo January 2020 with LVEF=50-55%, mild AI and mildly dilated aortic root. Chest CTA January 2020 with 4.4 cm ascending aortic aneurysm.    He is here today for follow up. The patient denies any chest pain, dyspnea, palpitations, lower extremity edema, orthopnea, PND, dizziness, near syncope or syncope.   Primary Care Physician: Biagio Borg, MD  Past Medical History:  Diagnosis Date  . Allergic rhinitis, cause unspecified 03/12/2013  . Allergy   . CAD (coronary artery disease)    S/P CABG in 1999   . Cataract   . Decreased exercise tolerance    Exertional fatigue  . Degenerative arthritis of hip   . Diverticulosis of colon   . ED (erectile dysfunction)   . Erectile dysfunction 06/03/2014  . History of colonic polyps   . History of kidney stones   . History of MI (myocardial infarction)   . Hx of adenomatous colonic polyps 2000  . Hyperlipidemia    Low HDL  . Lumbar disc disease    Hx of with recent back pain and buttock pain  . Myocardial infarction ([place]) 1999  . Right knee meniscal tear   . S/P removal of thyroid nodule   . Spinal stenosis of lumbar region 03/15/2014    Past Surgical History:  Procedure Laterality Date  . CARDIAC CATHETERIZATION    . CATARACT EXTRACTION    . COLONOSCOPY    . CORONARY ARTERY BYPASS GRAFT  1999  . CYSTOSCOPY/URETEROSCOPY/HOLMIUM  LASER/STENT PLACEMENT Left 12/23/2017   Procedure: CYSTOSCOPY/RETROGRADE/URETEROSCOPY/HOLMIUM LASER/STENT PLACEMENT;  Surgeon: Festus Aloe, MD;  Location: [facility];  Service: Urology;  Laterality: Left;  ONLY NEEDS 60 MIN  . LUMBAR DISC SURGERY    . Nodules on Thyroid    . POLYPECTOMY    . PTCA    . SHOULDER ARTHROSCOPY WITH ROTATOR CUFF REPAIR AND SUBACROMIAL DECOMPRESSION Right 01/30/2015   Procedure: RIGHT ARTHROSCOPY SHOULDER SUBACROMIAL DECOMPRESSION DISTAL CLAVICAL RESECTION RETATOR CUFF REPAIR;  Surgeon: Justice Britain, MD;  Location: [facility];  Service: Orthopedics;  Laterality: Right;  . TONSILLECTOMY    . VASECTOMY      Current Outpatient Medications  Medication Sig Dispense Refill  . acetaminophen (TYLENOL) 650 MG CR tablet Take 650 mg by mouth every 8 (eight) hours as needed for pain.     . calcium-vitamin D (OSCAL WITH D) 500-200 MG-UNIT per tablet Take 1 tablet by mouth 2 (two) times daily.     . cetirizine (ZYRTEC) 10 MG tablet Take 10 mg by mouth daily.    . clopidogrel (PLAVIX) 75 MG tablet TAKE 1 TABLET BY MOUTH EVERY DAY 90 tablet 3  . fish oil-omega-3 fatty acids 1000 MG capsule Take 1 g by mouth 2 (two) times daily.     . folic acid (FOLVITE) A999333 MCG tablet Take 400  mcg by mouth daily.      . Multiple Vitamin (MULTIVITAMIN WITH MINERALS) TABS tablet Take 1 tablet by mouth daily. Centrum Silver    . rosuvastatin (CRESTOR) 5 MG tablet Take 1 tablet (5 mg total) by mouth 2 (two) times daily. 180 tablet 3  . tamsulosin (FLOMAX) 0.4 MG CAPS capsule TAKE 1 CAPSULE BY MOUTH EVERY DAY 90 capsule 1   No current facility-administered medications for this visit.    Allergies  Allergen Reactions  . Beta Adrenergic Blockers Other (See Comments)    Dropped blood pressure too low  . Simvastatin Other (See Comments)    myalgia    Social History   Socioeconomic History  . Marital status: Married    Spouse name: Not on file  . Number of children: 2  . Years of education: Not  on file  . Highest education level: Not on file  Occupational History  . 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 Smoking status: Never Smoker  . Smokeless tobacco: Never Used  Substance and Sexual Activity  . Alcohol use: Yes    Alcohol/week: 0.0 standard drinks    Comment: rare  . Drug use: No  . Sexual activity: Not Currently  Other Topics Concern  . Not on file  Social History Narrative   Moved from [facility]   Married      [employer] on file   Social Determinants of Health   Financial Resource Strain:   . Difficulty of Paying Living Expenses: Not on file  Food Insecurity:   . Worried About Charity fundraiser in the Last Year: Not on file  . Ran Out of Food in the Last Year: Not on file  Transportation Needs:   . Lack of Transportation (Medical): Not on file  . Lack of Transportation (Non-Medical): Not on file  Physical Activity:   . Days of Exercise per Week: Not on file  . Minutes of Exercise per Session: Not on file  Stress:   . Feeling of Stress : Not on file  Social Connections:   . Frequency of Communication with Friends and Family: Not on file  . Frequency of Social Gatherings with Friends and Family: Not on file  . Attends Religious Services: Not on file  . Active Member of Clubs or Organizations: Not on file  . Attends Archivist Meetings: Not on file  . Marital Status: Not on file  Intimate Partner Violence:   . Fear of Current or Ex-Partner: Not on file  . Emotionally Abused: Not on file  . Physically Abused: Not on file  . Sexually Abused: Not on file    Family History  Problem Relation Age of Onset  . Prostate cancer Other   . Prostate cancer Other   . Heart disease Mother   . Heart disease Brother   . Colon cancer Neg Hx   . Rectal cancer Neg Hx   . Stomach cancer Neg Hx     Review of Systems:  As stated in the HPI and otherwise negative.   BP 116/62   Pulse 64   Ht 5\' 7"   (1.702 m)   Wt 178 lb 12.8 oz (81.1 kg)   SpO2 97%   BMI 28.00 kg/m   Physical Examination:  General: Well developed, well nourished, NAD  HEENT: OP clear, mucus membranes moist  SKIN: warm, dry. No rashes. Neuro: No focal deficits  Musculoskeletal: Muscle strength 5/5  all ext  Psychiatric: Mood and affect normal  Neck: No JVD, no carotid bruits, no thyromegaly, no lymphadenopathy.  Lungs:Clear bilaterally, no wheezes, rhonci, crackles Cardiovascular: Regular rate and rhythm. No murmurs, gallops or rubs. Abdomen:Soft. Bowel sounds present. Non-tender.  Extremities: No lower extremity edema. Pulses are 2 + in the bilateral DP/PT.  Echo January 2020: - Left ventricle: The cavity size was normal. Wall thickness was   increased in a pattern of mild LVH. Systolic function was normal.   The estimated ejection fraction was in the range of 50% to 55%.   Wall motion was normal; there were no regional wall motion   abnormalities. Doppler parameters are consistent with abnormal   left ventricular relaxation (grade 1 diastolic dysfunction). - Aortic valve: There was mild regurgitation. - Aortic root: The aortic root was mildly dilated. - Ascending aorta: The ascending aorta was moderately dilated.  Impressions:  - Low normal LV systolic function; mild LVH; mild diastolic   dysfunction; calcified aortic valve with mild AI; moderately   dilated ascending aorta (suggest CTA or MRA to further assess).  EKG:  EKG is not ordered today. The ekg ordered today demonstrates   Recent Labs: 09/07/2019: ALT 16; BUN 21; Creatinine, Ser 1.20; Hemoglobin 17.2; Platelets 235.0; Potassium 4.0; Sodium 140; TSH 1.97   Lipid Panel    Component Value Date/Time   CHOL 114 09/07/2019 0955   CHOL 121 05/23/2018 0939   CHOL 118 04/03/2014 0843   TRIG 199.0 (H) 09/07/2019 0955   TRIG 162 (H) 04/03/2014 0843   HDL 32.50 (L) 09/07/2019 0955   HDL 34 (L) 05/23/2018 0939   HDL 36 (L) 04/03/2014 0843    CHOLHDL 3 09/07/2019 0955   VLDL 39.8 09/07/2019 0955   LDLCALC 41 09/07/2019 0955   LDLCALC 61 05/23/2018 0939   LDLCALC 50 04/03/2014 0843   LDLDIRECT 91.5 02/13/2008 1400     Wt Readings from Last 3 Encounters:  02/22/20 178 lb 12.8 oz (81.1 kg)  09/07/19 177 lb (80.3 kg)  07/26/19 178 lb 6.4 oz (80.9 kg)     Other studies Reviewed: Additional studies/ records that were reviewed today include: . Review of the above records demonstrates:    Assessment and Plan:   1. CAD s/p CABG without angina: He has no chest pain. LV function normal by echo in January 2020. Will continue statin and Plavix.     2. PVCs: He has no palpitations. He did not tolerate Cardizem in the past.   3. HLD: LDL at goal in September 2020. Continue statin.   He does not wish to schedule a stress test at this time since he may not proceed with the back surgery. He will call with changes.   Current medicines are reviewed at length with the patient today.  The patient does not have concerns regarding medicines.  The following changes have been made:  no change  Labs/ tests ordered today include:   No orders of the defined types were placed in this encounter.  Disposition:   FU with me in 12 months  Signed, Lauree Chandler, MD 02/22/2020 4:42 PM    [facility] [address], [place], [place]  [address] Phone: [phone]; Fax: [phone]

## 2020-03-06 ENCOUNTER — Ambulatory Visit: Payer: Medicare Other | Admitting: Internal Medicine

## 2020-04-18 ENCOUNTER — Other Ambulatory Visit: Payer: Medicare Other | Admitting: *Deleted

## 2020-04-18 ENCOUNTER — Other Ambulatory Visit: Payer: Self-pay

## 2020-04-18 DIAGNOSIS — Z01812 Encounter for preprocedural laboratory examination: Secondary | ICD-10-CM | POA: Diagnosis not present

## 2020-04-19 LAB — BASIC METABOLIC PANEL
BUN/Creatinine Ratio: 14 (ref 10–24)
BUN: 15 mg/dL (ref 8–27)
CO2: 24 mmol/L (ref 20–29)
Calcium: 9.3 mg/dL (ref 8.6–10.2)
Chloride: 105 mmol/L (ref 96–106)
Creatinine, Ser: 1.11 mg/dL (ref 0.76–1.27)
GFR calc Af Amer: 70 mL/min/{1.73_m2} (ref >59)
GFR calc non Af Amer: 60 mL/min/{1.73_m2} (ref >59)
Glucose: 101 mg/dL — ABNORMAL HIGH (ref 65–99)
Potassium: 4.1 mmol/L (ref 3.5–5.2)
Sodium: 142 mmol/L (ref 134–144)

## 2020-04-22 ENCOUNTER — Other Ambulatory Visit: Payer: Self-pay

## 2020-04-22 ENCOUNTER — Ambulatory Visit (HOSPITAL_COMMUNITY)
Admission: RE | Admit: 2020-04-22 | Discharge: 2020-04-22 | Disposition: A | Discharge status: Home / Self Care | Payer: Medicare Other | Source: Ambulatory Visit | Attending: Cardiovascular Disease | Admitting: Cardiovascular Disease

## 2020-04-22 DIAGNOSIS — I712 Thoracic aortic aneurysm, without rupture: Secondary | ICD-10-CM | POA: Diagnosis not present

## 2020-04-22 DIAGNOSIS — I25810 Atherosclerosis of coronary artery bypass graft(s) without angina pectoris: Secondary | ICD-10-CM | POA: Insufficient documentation

## 2020-04-22 DIAGNOSIS — I77810 Thoracic aortic ectasia: Secondary | ICD-10-CM | POA: Insufficient documentation

## 2020-04-22 MED ORDER — IOHEXOL 350 MG/ML SOLN
100.0000 mL | Freq: Once | INTRAVENOUS | Status: AC | PRN
  Administered 2020-04-22: 100 mL via INTRAVENOUS

## 2020-04-29 ENCOUNTER — Other Ambulatory Visit: Payer: Self-pay | Admitting: Cardiovascular Disease

## 2020-05-30 DIAGNOSIS — R29898 Other symptoms and signs involving the musculoskeletal system: Secondary | ICD-10-CM | POA: Diagnosis not present

## 2020-05-30 DIAGNOSIS — M4316 Spondylolisthesis, lumbar region: Secondary | ICD-10-CM | POA: Diagnosis not present

## 2020-05-30 DIAGNOSIS — R2689 Other abnormalities of gait and mobility: Secondary | ICD-10-CM | POA: Diagnosis not present

## 2020-07-14 ENCOUNTER — Other Ambulatory Visit: Payer: Self-pay

## 2020-07-14 ENCOUNTER — Ambulatory Visit (INDEPENDENT_AMBULATORY_CARE_PROVIDER_SITE_OTHER): Payer: Medicare Other | Admitting: Internal Medicine

## 2020-07-14 ENCOUNTER — Encounter: Payer: Self-pay | Admitting: Internal Medicine

## 2020-07-14 VITALS — BP 120/70 | HR 83 | Temp 98.3°F | Ht 67.0 in | Wt 178.0 lb

## 2020-07-14 DIAGNOSIS — J309 Allergic rhinitis, unspecified: Secondary | ICD-10-CM | POA: Diagnosis not present

## 2020-07-14 DIAGNOSIS — R739 Hyperglycemia, unspecified: Secondary | ICD-10-CM

## 2020-07-14 DIAGNOSIS — H6983 Other specified disorders of Eustachian tube, bilateral: Secondary | ICD-10-CM

## 2020-07-14 DIAGNOSIS — I25810 Atherosclerosis of coronary artery bypass graft(s) without angina pectoris: Secondary | ICD-10-CM | POA: Diagnosis not present

## 2020-07-14 DIAGNOSIS — I10 Essential (primary) hypertension: Secondary | ICD-10-CM | POA: Diagnosis not present

## 2020-07-14 NOTE — Patient Instructions (Signed)
Ok to try the otc Zyrtec 10 mg per day, the Nasacort as directed daily, and the Mucinex 1200 mg twice per day  Please continue all other medications as before, and refills have been done if requested.  Please have the pharmacy call with any other refills you may need.  Please continue your efforts at being more active, low cholesterol diet, and weight control.  Please keep your appointments with your specialists as you may have planned

## 2020-07-14 NOTE — Progress Notes (Addendum)
Subjective:    Patient ID: Larry [name], male    DOB: 01-06-1932, 86 y.o.   MRN: [id number]  HPI  Here to f/u; overall doing ok,  Pt denies chest pain, increasing sob or doe, wheezing, orthopnea, PND, increased LE swelling, palpitations, dizziness or syncope.  Pt denies new neurological symptoms such as new headache, or facial or extremity weakness or numbness.  Pt denies polydipsia, polyuria, or low sugar episode.  Pt states overall good compliance with meds.  Also, Does have several wks ongoing nasal allergy symptoms with clearish congestion, itch and sneezing, without fever, pain, ST, cough, swelling or wheezing, and also bilateral mild hearing reduction and popping sounds.  Has also a hole in the right TM chronic but this is different.   Past Medical History:  Diagnosis Date  . Allergic rhinitis, cause unspecified 03/12/2013  . Allergy   . CAD (coronary artery disease)    S/P CABG in 1999   . Cataract   . Decreased exercise tolerance    Exertional fatigue  . Degenerative arthritis of hip   . Diverticulosis of colon   . ED (erectile dysfunction)   . Erectile dysfunction 06/03/2014  . History of colonic polyps   . History of kidney stones   . History of MI (myocardial infarction)   . Hx of adenomatous colonic polyps 2000  . Hyperlipidemia    Low HDL  . Lumbar disc disease    Hx of with recent back pain and buttock pain  . Myocardial infarction ([place]) 1999  . Right knee meniscal tear   . S/P removal of thyroid nodule   . Spinal stenosis of lumbar region 03/15/2014   Past Surgical History:  Procedure Laterality Date  . CARDIAC CATHETERIZATION    . CATARACT EXTRACTION    . COLONOSCOPY    . CORONARY ARTERY BYPASS GRAFT  1999  . CYSTOSCOPY/URETEROSCOPY/HOLMIUM LASER/STENT PLACEMENT Left 12/23/2017   Procedure: CYSTOSCOPY/RETROGRADE/URETEROSCOPY/HOLMIUM LASER/STENT PLACEMENT;  Surgeon: Festus Aloe, MD;  Location: [facility];  Service: Urology;  Laterality: Left;  ONLY NEEDS 60 MIN    . LUMBAR DISC SURGERY    . Nodules on Thyroid    . POLYPECTOMY    . PTCA    . SHOULDER ARTHROSCOPY WITH ROTATOR CUFF REPAIR AND SUBACROMIAL DECOMPRESSION Right 01/30/2015   Procedure: RIGHT ARTHROSCOPY SHOULDER SUBACROMIAL DECOMPRESSION DISTAL CLAVICAL RESECTION RETATOR CUFF REPAIR;  Surgeon: Justice Britain, MD;  Location: [facility];  Service: Orthopedics;  Laterality: Right;  . TONSILLECTOMY    . VASECTOMY      reports that he has never smoked. He has never used smokeless tobacco. He reports current alcohol use. He reports that he does not use drugs. family history includes Heart disease in his brother and mother; Prostate cancer in some other family members. Allergies  Allergen Reactions  . Beta Adrenergic Blockers Other (See Comments)    Dropped blood pressure too low  . Simvastatin Other (See Comments)    myalgia   Current Outpatient Medications on File Prior to Visit  Medication Sig Dispense Refill  . acetaminophen (TYLENOL) 650 MG CR tablet Take 650 mg by mouth every 8 (eight) hours as needed for pain.     . calcium-vitamin D (OSCAL WITH D) 500-200 MG-UNIT per tablet Take 1 tablet by mouth 2 (two) times daily.     . cetirizine (ZYRTEC) 10 MG tablet Take 10 mg by mouth daily.    . clopidogrel (PLAVIX) 75 MG tablet TAKE 1 TABLET BY MOUTH EVERY DAY 90 tablet 3  .  fish oil-omega-3 fatty acids 1000 MG capsule Take 1 g by mouth 2 (two) times daily.     . folic acid (FOLVITE) 579 MCG tablet Take 400 mcg by mouth daily.      . Multiple Vitamin (MULTIVITAMIN WITH MINERALS) TABS tablet Take 1 tablet by mouth daily. Centrum Silver    . rosuvastatin (CRESTOR) 10 MG tablet Take 1 tablet (10 mg total) by mouth in the morning and at bedtime. Take 1 tablet daily alternating with half tablet daily. 180 tablet 3  . tamsulosin (FLOMAX) 0.4 MG CAPS capsule TAKE 1 CAPSULE BY MOUTH EVERY DAY 90 capsule 1   No current facility-administered medications on file prior to visit.   Review of Systems All  otherwise neg per pt    Objective:   Physical Exam BP 120/70 (BP Location: Left Arm, Patient Position: Sitting, Cuff Size: Large)   Pulse 83   Temp 98.3 F (36.8 C) (Oral)   Ht 5\' 7"  (1.702 m)   Wt 178 lb (80.7 kg)   SpO2 96%   BMI 27.88 kg/m  VS noted,  Constitutional: Pt appears in NAD HENT: Head: NCAT.  Right Ear: External ear normal.  Left Ear: External ear normal.  Eyes: . Pupils are equal, round, and reactive to light. Conjunctivae and EOM are normal bilat TMs with mild erythema and small chronic scar vs hole to right TM Nose: without d/c or deformity Neck: Neck supple. Gross normal ROM Cardiovascular: Normal rate and regular rhythm.   Pulmonary/Chest: Effort normal and breath sounds without rales or wheezing.  Neurological: Pt is alert. At baseline orientation, motor grossly intact Skin: Skin is warm. No rashes, other new lesions, no LE edema Psychiatric: Pt behavior is normal without agitation  All otherwise neg per pt       Assessment & Plan:

## 2020-07-21 ENCOUNTER — Encounter: Payer: Self-pay | Admitting: Internal Medicine

## 2020-07-21 DIAGNOSIS — H6983 Other specified disorders of Eustachian tube, bilateral: Secondary | ICD-10-CM | POA: Insufficient documentation

## 2020-07-21 DIAGNOSIS — H6993 Unspecified Eustachian tube disorder, bilateral: Secondary | ICD-10-CM | POA: Insufficient documentation

## 2020-07-21 NOTE — Assessment & Plan Note (Signed)
stable overall by history and exam, recent data reviewed with pt, and pt to continue medical treatment as before,  to f/u any worsening symptoms or concerns  

## 2020-07-21 NOTE — Assessment & Plan Note (Addendum)
Mild to mod, for zyrtec and nasacort asd,  to f/u any worsening symptoms or concerns  I spent 31 minutes in preparing to see the patient by review of recent labs, imaging and procedures, obtaining and reviewing separately obtained history, communicating with the patient and family or caregiver, ordering medications, tests or procedures, and documenting clinical information in the EHR including the differential Dx, treatment, and any further evaluation and other management of allergic rhinitis, eustachian valve dysfxn, hypreglycemia, htn

## 2020-07-21 NOTE — Assessment & Plan Note (Signed)
Also for mucinex bid prn; d/w pt - no wax impactions found today

## 2020-08-04 ENCOUNTER — Other Ambulatory Visit: Payer: Self-pay | Admitting: Cardiovascular Disease

## 2020-08-11 ENCOUNTER — Other Ambulatory Visit: Payer: Self-pay

## 2020-08-11 ENCOUNTER — Ambulatory Visit: Payer: Medicare Other | Attending: Neurological Surgery | Admitting: Physical Therapy

## 2020-08-11 DIAGNOSIS — M6281 Muscle weakness (generalized): Secondary | ICD-10-CM | POA: Diagnosis not present

## 2020-08-11 DIAGNOSIS — R296 Repeated falls: Secondary | ICD-10-CM | POA: Diagnosis not present

## 2020-08-11 DIAGNOSIS — R2689 Other abnormalities of gait and mobility: Secondary | ICD-10-CM | POA: Diagnosis not present

## 2020-08-11 DIAGNOSIS — R2681 Unsteadiness on feet: Secondary | ICD-10-CM | POA: Diagnosis not present

## 2020-08-11 NOTE — Therapy (Signed)
[facility] [address] [place], [place], [address] Phone: [phone]   Fax:  [phone]  Physical Therapy Evaluation  Patient Details  Name: Larry [name] MRN: [id number] Date of Birth: [birth year] Referring Provider (PT): Earleen Newport, MD   Encounter Date: 08/11/2020   PT End of Session - 08/11/20 1424    Visit Number 1    Number of Visits 17    Date for PT Re-Evaluation 11/09/20    Authorization Type Medicare and [employer]; cert period set for 90 days due to pt will be out of town for 10 days from Sept 24th > October 7th.    Progress Note Due on Visit 10    PT Start Time 1315    PT Stop Time 1400    PT Time Calculation (min) 45 min    Activity Tolerance Patient tolerated treatment well    Behavior During Therapy WFL for tasks assessed/performed           Past Medical History:  Diagnosis Date  . Allergic rhinitis, cause unspecified 03/12/2013  . Allergy   . CAD (coronary artery disease)    S/P CABG in 1999   . Cataract   . Decreased exercise tolerance    Exertional fatigue  . Degenerative arthritis of hip   . Diverticulosis of colon   . ED (erectile dysfunction)   . Erectile dysfunction 06/03/2014  . History of colonic polyps   . History of kidney stones   . History of MI (myocardial infarction)   . Hx of adenomatous colonic polyps 2000  . Hyperlipidemia    Low HDL  . Lumbar disc disease    Hx of with recent back pain and buttock pain  . Myocardial infarction ([place]) 1999  . Right knee meniscal tear   . S/P removal of thyroid nodule   . Spinal stenosis of lumbar region 03/15/2014    Past Surgical History:  Procedure Laterality Date  . CARDIAC CATHETERIZATION    . CATARACT EXTRACTION    . COLONOSCOPY    . CORONARY ARTERY BYPASS GRAFT  1999  . CYSTOSCOPY/URETEROSCOPY/HOLMIUM LASER/STENT PLACEMENT Left 12/23/2017   Procedure: CYSTOSCOPY/RETROGRADE/URETEROSCOPY/HOLMIUM LASER/STENT PLACEMENT;   Surgeon: Festus Aloe, MD;  Location: [facility];  Service: Urology;  Laterality: Left;  ONLY NEEDS 60 MIN  . LUMBAR DISC SURGERY    . Nodules on Thyroid    . POLYPECTOMY    . PTCA    . SHOULDER ARTHROSCOPY WITH ROTATOR CUFF REPAIR AND SUBACROMIAL DECOMPRESSION Right 01/30/2015   Procedure: RIGHT ARTHROSCOPY SHOULDER SUBACROMIAL DECOMPRESSION DISTAL CLAVICAL RESECTION RETATOR CUFF REPAIR;  Surgeon: Justice Britain, MD;  Location: [facility];  Service: Orthopedics;  Laterality: Right;  . TONSILLECTOMY    . VASECTOMY      There were no vitals filed for this visit.    Subjective Assessment - 08/11/20 1320    Subjective Referral states vestibular rehab but pt reports he is not having any dizziness or vertigo.  Pt would prefer therapy at [facility] because he has had therapy there previously.  Pt continues to do exercises every day and walk at the park but is limited due to balance impairments.  Had a fall recently, foot caught the edge of the curb and he fell forwards with bruising and skin lacerations, still has some shoulder soreness from bracing.  Has difficulty getting out of a chair, turning, bending forwards and walking longer distances.  RLE lower leg has atrophied compared to L.    Patient  is accompained by: Family member    Diagnostic tests Xray demonstrated lumbar scoliosis and spondylolisthesis stable from a year ago.    Patient Stated Goals Would like to improve balance to be able to walk longer distances, strengthen R calf, be able to get out of a chair, turn and bend forward without fear of falling.    Currently in Pain? No/denies              [facility] PT Assessment - 08/11/20 1334      Assessment   Medical Diagnosis Balance impairments, fall    Referring Provider (PT) Earleen Newport, MD    Onset Date/Surgical Date 05/30/20    Prior Therapy yes, [facility]      Precautions   Precautions Other (comment)    Precaution Comments allergic rhinitis, CAD s/p CABG in 1999, OA of hip, h/o  MI, adenomatous colonic polyps, HLD, lumbar disc disease, R knee meniscus tear, removal of thyroid nodule, lumbar spinal stenosis, shoulder arthroscopy wtih rotator cuff repair and subacromial decompression      Balance Screen   Has the patient fallen in the past 6 months Yes    How many times? 1   tripped over a curb     [place] residence    Living Arrangements Spouse/significant other    Type of Home Other(Comment)   condo/townhome   [place] to enter    Entrance Stairs-Number of Steps 5    [place] Two level;Able to live on main level with bedroom/bathroom    Alternate Level Stairs-Number of Steps 13    Alternate Level Stairs-Rails Left;Right    [place] - single point      Prior Function   Level of Independence Independent    Vocation Retired    Leisure Doesn't use cane in the house, uses outside when he walks at the park.  Rides stationary bike upstairs in condo      Sensation   Light Touch Appears Intact      ROM / Strength   AROM / PROM / Strength Strength      Strength   Overall Strength Deficits    Overall Strength Comments LLE: 5/5; RLE: 4-/5 overall    Strength Assessment Site Ankle    Right/Left Ankle Right;Left    Right Ankle Plantar Flexion 2+/5    Left Ankle Plantar Flexion 3+/5   able to do 10 single leg heel raises     Ambulation/Gait   Ambulation/Gait Yes    Ambulation/Gait Assistance 6: Modified independent (Device/Increase time)    Ambulation Distance (Feet) 115 Feet    Assistive device Straight cane    Gait Pattern Step-through pattern;Decreased step length - right;Decreased step length - left;Decreased stride length;Decreased stance time - right;Right flexed knee in stance;Left flexed knee in stance;Decreased trunk rotation;Wide base of support    Ambulation Surface Level;Indoor      Standardized Balance Assessment   Standardized Balance Assessment Five  Times Sit to Stand;Berg Balance Test    Five times sit to stand comments  10 seconds from regular chair, no UE support.  16" seat: 12 seconds, no UE.  14" seat 14 seconds without UE support.      Berg Balance Test   Sit to Stand Able to stand without using hands and stabilize independently    Standing Unsupported Able to stand safely 2 minutes    Sitting with Back Unsupported but Feet  Supported on Floor or Stool Able to sit safely and securely 2 minutes    Stand to Sit Sits safely with minimal use of hands    Transfers Able to transfer safely, minor use of hands    Standing Unsupported with Eyes Closed Able to stand 10 seconds with supervision    Standing Unsupported with Feet Together Able to place feet together independently and stand for 1 minute with supervision    From Standing, Reach Forward with Outstretched Arm Can reach forward >12 cm safely (5")    From Standing Position, Pick up Object from Floor Able to pick up shoe, needs supervision    From Standing Position, Turn to Look Behind Over each Shoulder Looks behind one side only/other side shows less weight shift    Turn 360 Degrees Needs close supervision or verbal cueing    Standing Unsupported, Alternately Place Feet on Step/Stool Able to complete >2 steps/needs minimal assist    Standing Unsupported, One Foot in Front Able to plae foot ahead of the other independently and hold 30 seconds    Standing on One Leg Tries to lift leg/unable to hold 3 seconds but remains standing independently    Total Score 41    Berg comment: 41/56                      Objective measurements completed on examination: See above findings.               PT Education - 08/11/20 1424    Education Details clinical findings, PT POC and goals, transition follow up visits to [facility] due to no specific vestibular complaints    Person(s) Educated Patient    Methods Explanation    Comprehension Verbalized understanding             PT Short Term Goals - 08/11/20 1437      PT SHORT TERM GOAL #1   Title Pt will be independent with initial HEP before he travels on Sept 24th    Time 4    Period Weeks    Status New    Target Date 09/10/20      PT SHORT TERM GOAL #2   Title Pt will improve RLE gastroc/soleus strength to be able to perform 2-3 single leg heel lifts to improve push off during gait    Baseline 0 single leg heel raise    Time 4    Period Weeks    Status New    Target Date 09/10/20      PT SHORT TERM GOAL #3   Title Pt will improve BERG balance score to >/= 45/56    Baseline 41/45    Time 4    Period Weeks    Status New    Target Date 09/10/20      PT SHORT TERM GOAL #4   Title Pt will safely negotiate curb with cane and supervision x 5 reps with improved foot clearance.    Time 4    Period Weeks    Status New    Target Date 09/10/20             PT Long Term Goals - 08/11/20 1441      PT LONG TERM GOAL #1   Title Pt will demonstrate independence with final HEP    Time 10   due to 10 days out of town   Period Weeks    Status New    Target Date 10/20/20  PT LONG TERM GOAL #2   Title Pt will demonstrate ability to perform 5-6 single leg heel lifts on RLE to improve push off for gait    Time 10    Period Weeks    Status New    Target Date 10/20/20      PT LONG TERM GOAL #3   Title Pt will improve BERG balance score to >/= 50/56 to indicate decreased risk for falls    Time 10    Period Weeks    Status New    Target Date 10/20/20      PT LONG TERM GOAL #4   Title Pt will negotiate 12 stairs with one rail, alternating sequence without catching either foot on stairs, MOD I    Time 10    Period Weeks    Status New    Target Date 10/20/20      PT LONG TERM GOAL #5   Title Pt will demonstrate ability to stand from low chair (14") without use of UE x 5 reps in </= 12 seconds    Baseline 14 seconds from 14" chair without use of UE    Time 10    Period Weeks    Status  New    Target Date 10/20/20                  Plan - 08/11/20 1426    Clinical Impression Statement Pt is an 86 year old male referred to Neuro OPPT for evaluation of balance impairments and falls - referral stated vestibular rehab but pt denies any true vertigo or dizziness.  Pt's PMH is significant for the following: allergic rhinitis, CAD s/p CABG in 1999, OA of hip, h/o MI, adenomatous colonic polyps, HLD, lumbar disc disease, R knee meniscus tear, removal of thyroid nodule, lumbar spinal stenosis, shoulder arthroscopy with rotator cuff repair and subacromial decompression. The following deficits were noted during pt's exam: bilateral proximal hip weakness, RLE weakness especially in gastrocnemius/soleus muscles with significant atrophy compared to LLE, impaired standing balance and impaired gait.  Pt denies any pain or radicular symptoms down RLE and reports that his nerve conduction is normal.  Pt's xrays are stable compared to las year.  Pt's BERG score indicates pt is at increased risk for falls. Pt would benefit from skilled PT to address these impairments and functional limitations to maximize functional mobility independence and reduce falls risk.    Personal Factors and Comorbidities Comorbidity 3+;Age    Comorbidities allergic rhinitis, CAD s/p CABG in 1999, OA of hip, h/o MI, adenomatous colonic polyps, HLD, lumbar disc disease, R knee meniscus tear, removal of thyroid nodule, lumbar spinal stenosis, shoulder arthroscopy wtih rotator cuff repair and subacromial decompression    Examination-Activity Limitations Bend;Locomotion Level;Stairs;Stand    Examination-Participation Restrictions Community Activity    Stability/Clinical Decision Making Stable/Uncomplicated    Clinical Decision Making Low    Rehab Potential Good    PT Frequency 2x / week    PT Duration 12 weeks    PT Treatment/Interventions ADLs/Self Care Home Management;Aquatic Therapy;Electrical Stimulation;DME  Instruction;Gait training;Stair training;Functional mobility training;Therapeutic activities;Therapeutic exercise;Balance training;Neuromuscular re-education;Patient/family education    PT Next Visit Plan Pt going out of town on Sept 24 > October 7; initiate HEP focusing on R/L proximal hip/glute strength, RLE strength especially gastroc/soleus, standing balance especially with: narrow BOS, eyes closed, turns, bending forwards, single leg stance for stairs/curbs/stepping over obstacles.  Estim to strengthen R gastroc/soleus muscles.    Consulted and Agree with Plan  of Care Patient;Family member/caregiver    Family Member Consulted wife           Patient will benefit from skilled therapeutic intervention in order to improve the following deficits and impairments:  Abnormal gait, Decreased balance, Decreased strength, Difficulty walking  Visit Diagnosis: Repeated falls  Muscle weakness (generalized)  Other abnormalities of gait and mobility  Unsteadiness on feet     Problem List Patient Active Problem List   Diagnosis Date Noted  . Dysfunction of Eustachian tube, bilateral 07/21/2020  . Gait disorder 04/20/2016  . Hyperglycemia 04/20/2016  . Abnormal CT scan, chest 06/03/2014  . Erectile dysfunction 06/03/2014  . Spinal stenosis of lumbar region 03/15/2014  . Cough 03/12/2013  . Allergic rhinitis 03/12/2013  . Leg pain, bilateral 12/30/2011  . Bladder neck obstruction 12/30/2011  . Preventative health care 12/25/2011  . Lumbar disc disease 12/25/2011  . S/P removal of thyroid nodule 12/25/2011  . History of MI (myocardial infarction) 12/25/2011  . Right knee meniscal tear 12/25/2011  . Degenerative arthritis of hip 12/25/2011  . HIP PAIN, RIGHT 05/14/2010  . FATIGUE 10/13/2009  . HYPERTENSION, BENIGN 02/25/2009  . CAD, AUTOLOGOUS BYPASS GRAFT 02/25/2009  . CORONARY ARTERY ANEURYSM 02/25/2009  . BACK PAIN 04/15/2008  . Hyperlipidemia 02/13/2008  . CORONARY ARTERY DISEASE  02/13/2008  . DIVERTICULOSIS, COLON 02/13/2008  . CARDIAC DISEASE, HX OF 02/13/2008  . COLONIC POLYPS, HX OF 02/13/2008    Rico Junker, PT, DPT 08/11/20    2:47 PM    [facility] [address] [place], [place], [address] Phone: [phone]   Fax:  [phone]  Name: Larry [name] MRN: [id number] Date of Birth: 1932/05/04

## 2020-08-13 ENCOUNTER — Other Ambulatory Visit: Payer: Self-pay | Admitting: Internal Medicine

## 2020-08-13 NOTE — Telephone Encounter (Signed)
Please refill as per office routine med refill policy (all routine meds refilled for 3 mo or monthly per pt preference up to one year from last visit, then month to month grace period for 3 mo, then further med refills will have to be denied)  

## 2020-08-14 ENCOUNTER — Other Ambulatory Visit: Payer: Self-pay

## 2020-08-14 ENCOUNTER — Ambulatory Visit: Payer: Medicare Other | Admitting: Physical Therapy

## 2020-08-14 DIAGNOSIS — R296 Repeated falls: Secondary | ICD-10-CM | POA: Diagnosis not present

## 2020-08-14 DIAGNOSIS — R2681 Unsteadiness on feet: Secondary | ICD-10-CM

## 2020-08-14 DIAGNOSIS — M6281 Muscle weakness (generalized): Secondary | ICD-10-CM

## 2020-08-14 DIAGNOSIS — R2689 Other abnormalities of gait and mobility: Secondary | ICD-10-CM

## 2020-08-14 NOTE — Patient Instructions (Signed)
Access Code: [email or web address] URL: [email or web address] Date: 08/14/2020 Prepared by: Ruben Im  Exercises Sit to Stand - 1 x daily - 7 x weekly - 1 sets - 10 reps Forward Step Up with Counter Support - 1 x daily - 7 x weekly - 1 sets - 10 reps

## 2020-08-14 NOTE — Therapy (Signed)
[facility] [address] [place], [place], [address] Phone: [phone]   Fax:  [phone]  Physical Therapy Treatment  Patient Details  Name: Larry [name] MRN: [id number] Date of Birth: August 19, 1932 Referring Provider (PT): Earleen Newport, MD   Encounter Date: 08/14/2020   PT End of Session - 08/14/20 1553    Visit Number 2    Number of Visits 17    Date for PT Re-Evaluation 11/09/20    Authorization Type Medicare and [employer]; cert period set for 90 days due to pt will be out of town for 10 days from Sept 24th > October 7th.    Progress Note Due on Visit 10    PT Start Time 1400    PT Stop Time 1444    PT Time Calculation (min) 44 min    Activity Tolerance Patient tolerated treatment well           Past Medical History:  Diagnosis Date  . Allergic rhinitis, cause unspecified 03/12/2013  . Allergy   . CAD (coronary artery disease)    S/P CABG in 1999   . Cataract   . Decreased exercise tolerance    Exertional fatigue  . Degenerative arthritis of hip   . Diverticulosis of colon   . ED (erectile dysfunction)   . Erectile dysfunction 06/03/2014  . History of colonic polyps   . History of kidney stones   . History of MI (myocardial infarction)   . Hx of adenomatous colonic polyps 2000  . Hyperlipidemia    Low HDL  . Lumbar disc disease    Hx of with recent back pain and buttock pain  . Myocardial infarction ([place]) 1999  . Right knee meniscal tear   . S/P removal of thyroid nodule   . Spinal stenosis of lumbar region 03/15/2014    Past Surgical History:  Procedure Laterality Date  . CARDIAC CATHETERIZATION    . CATARACT EXTRACTION    . COLONOSCOPY    . CORONARY ARTERY BYPASS GRAFT  1999  . CYSTOSCOPY/URETEROSCOPY/HOLMIUM LASER/STENT PLACEMENT Left 12/23/2017   Procedure: CYSTOSCOPY/RETROGRADE/URETEROSCOPY/HOLMIUM LASER/STENT PLACEMENT;  Surgeon: Festus Aloe, MD;  Location: [facility];   Service: Urology;  Laterality: Left;  ONLY NEEDS 60 MIN  . LUMBAR DISC SURGERY    . Nodules on Thyroid    . POLYPECTOMY    . PTCA    . SHOULDER ARTHROSCOPY WITH ROTATOR CUFF REPAIR AND SUBACROMIAL DECOMPRESSION Right 01/30/2015   Procedure: RIGHT ARTHROSCOPY SHOULDER SUBACROMIAL DECOMPRESSION DISTAL CLAVICAL RESECTION RETATOR CUFF REPAIR;  Surgeon: Justice Britain, MD;  Location: [facility];  Service: Orthopedics;  Laterality: Right;  . TONSILLECTOMY    . VASECTOMY      There were no vitals filed for this visit.   Subjective Assessment - 08/14/20 1400    Subjective I ride my stationary bike 5x/week.  I can't stand very long.  I walk around the park at [facility].  I've fallen 2x.  One time was from a drop off at the park.  Another time was from rushing and tripped going up on curb.    Pertinent History In 1964 had disc removed with residual right LE weakness    Diagnostic tests Xray demonstrated lumbar scoliosis and spondylolisthesis stable from a year ago.    Patient Stated Goals Would like to improve balance to be able to walk longer distances, strengthen R calf, be able to get out of a chair, turn and bend forward without fear of  falling.    Currently in Pain? No/denies              [facility] PT Assessment - 08/14/20 0001      Dynamic Gait Index   Level Surface Mild Impairment   without cane   Change in Gait Speed Moderate Impairment    Gait with Horizontal Head Turns Mild Impairment    Gait with Vertical Head Turns Mild Impairment    Gait and Pivot Turn Moderate Impairment    Step Over Obstacle Mild Impairment    Step Around Obstacles Mild Impairment    Steps Mild Impairment    Total Score 14    DGI comment: all performed without cane                         [facility] Adult PT Treatment/Exercise - 08/14/20 0001      Neuro Re-ed    Neuro Re-ed Details  head movements; stepping over and around obstacles; "mod difficult" hallway walk "confident"       Knee/Hip  Exercises: Aerobic   Nustep L1 10 minutes while discussing status, current HEP       Knee/Hip Exercises: Standing   Forward Step Up Right;Left;2 sets;5 reps    Other Standing Knee Exercises 10# modified dead lift to shin level 2x 5 reps       Knee/Hip Exercises: Seated   Sit to Sand 2 sets;10 reps   holding 5# and 10# weight                 PT Education - 08/14/20 1545    Education Details sit to stand; step ups    Person(s) Educated Patient    Methods Explanation;Demonstration;Handout    Comprehension Returned demonstration;Verbalized understanding            PT Short Term Goals - 08/11/20 1437      PT SHORT TERM GOAL #1   Title Pt will be independent with initial HEP before he travels on Sept 24th    Time 4    Period Weeks    Status New    Target Date 09/10/20      PT SHORT TERM GOAL #2   Title Pt will improve RLE gastroc/soleus strength to be able to perform 2-3 single leg heel lifts to improve push off during gait    Baseline 0 single leg heel raise    Time 4    Period Weeks    Status New    Target Date 09/10/20      PT SHORT TERM GOAL #3   Title Pt will improve BERG balance score to >/= 45/56    Baseline 41/45    Time 4    Period Weeks    Status New    Target Date 09/10/20      PT SHORT TERM GOAL #4   Title Pt will safely negotiate curb with cane and supervision x 5 reps with improved foot clearance.    Time 4    Period Weeks    Status New    Target Date 09/10/20             PT Long Term Goals - 08/11/20 1441      PT LONG TERM GOAL #1   Title Pt will demonstrate independence with final HEP    Time 10   due to 10 days out of town   Period Weeks    Status New    Target Date 10/20/20  PT LONG TERM GOAL #2   Title Pt will demonstrate ability to perform 5-6 single leg heel lifts on RLE to improve push off for gait    Time 10    Period Weeks    Status New    Target Date 10/20/20      PT LONG TERM GOAL #3   Title Pt will improve  BERG balance score to >/= 50/56 to indicate decreased risk for falls    Time 10    Period Weeks    Status New    Target Date 10/20/20      PT LONG TERM GOAL #4   Title Pt will negotiate 12 stairs with one rail, alternating sequence without catching either foot on stairs, MOD I    Time 10    Period Weeks    Status New    Target Date 10/20/20      PT LONG TERM GOAL #5   Title Pt will demonstrate ability to stand from low chair (14") without use of UE x 5 reps in </= 12 seconds    Baseline 14 seconds from 14" chair without use of UE    Time 10    Period Weeks    Status New    Target Date 10/20/20                 Plan - 08/14/20 1546    Clinical Impression Statement The patient's score with Dynamic Gait Index is 14/24 indicating moderately impaired balance.  He has difficulty stabilizing with faster gait speeds and making turns.  He has been highly compliant with the HEP he was given in PT 2 years again which includes sit to stand, seated march and leg lifts and standing heel raises.  He feels he is ready for a progression.  He was instructed in the addition of an added weight with sit to stand as well as the initiation of loaded ex's including modified dead lifts  and step ups.  Therapist monitoring response and providing cues as needed for best technique.  CGA for safety with more challenging balance maneuvers.    Comorbidities allergic rhinitis, CAD s/p CABG in 1999, OA of hip, h/o MI, adenomatous colonic polyps, HLD, lumbar disc disease, R knee meniscus tear, removal of thyroid nodule, lumbar spinal stenosis, shoulder arthroscopy wtih rotator cuff repair and subacromial decompression    Examination-Activity Limitations Bend;Locomotion Level;Stairs;Stand    Examination-Participation Restrictions Community Activity    Rehab Potential Good    PT Frequency 2x / week    PT Duration 12 weeks    PT Treatment/Interventions ADLs/Self Care Home Management;Aquatic Therapy;Electrical  Stimulation;DME Instruction;Gait training;Stair training;Functional mobility training;Therapeutic activities;Therapeutic exercise;Balance training;Neuromuscular re-education;Patient/family education    PT Next Visit Plan review sit to stand with 10#;  add 10# modified dead lifts to HEP;  hallway fast "confident" walks;  step ups;  balance ex's including narrow BOS, eyes closed, turns, head movements    PT Home Exercise Plan [email or web address]           Patient will benefit from skilled therapeutic intervention in order to improve the following deficits and impairments:  Abnormal gait, Decreased balance, Decreased strength, Difficulty walking  Visit Diagnosis: Repeated falls  Muscle weakness (generalized)  Other abnormalities of gait and mobility  Unsteadiness on feet     Problem List Patient Active Problem List   Diagnosis Date Noted  . Dysfunction of Eustachian tube, bilateral 07/21/2020  . Gait disorder 04/20/2016  . Hyperglycemia 04/20/2016  . Abnormal CT  scan, chest 06/03/2014  . Erectile dysfunction 06/03/2014  . Spinal stenosis of lumbar region 03/15/2014  . Cough 03/12/2013  . Allergic rhinitis 03/12/2013  . Leg pain, bilateral 12/30/2011  . Bladder neck obstruction 12/30/2011  . Preventative health care 12/25/2011  . Lumbar disc disease 12/25/2011  . S/P removal of thyroid nodule 12/25/2011  . History of MI (myocardial infarction) 12/25/2011  . Right knee meniscal tear 12/25/2011  . Degenerative arthritis of hip 12/25/2011  . HIP PAIN, RIGHT 05/14/2010  . FATIGUE 10/13/2009  . HYPERTENSION, BENIGN 02/25/2009  . CAD, AUTOLOGOUS BYPASS GRAFT 02/25/2009  . CORONARY ARTERY ANEURYSM 02/25/2009  . BACK PAIN 04/15/2008  . Hyperlipidemia 02/13/2008  . CORONARY ARTERY DISEASE 02/13/2008  . DIVERTICULOSIS, COLON 02/13/2008  . CARDIAC DISEASE, HX OF 02/13/2008  . COLONIC POLYPS, HX OF 02/13/2008   Ruben Im, PT 08/14/20 5:06 PM Phone: [phone] Fax:  [phone] Alvera Singh 08/14/2020, 5:06 PM  [facility] [address] [place], [place], [address] Phone: [phone]   Fax:  [phone]  Name: Larry [name] MRN: [id number] Date of Birth: May 19, 1932

## 2020-08-19 ENCOUNTER — Other Ambulatory Visit: Payer: Self-pay

## 2020-08-19 ENCOUNTER — Encounter: Payer: Self-pay | Admitting: Physical Therapy

## 2020-08-19 ENCOUNTER — Ambulatory Visit: Payer: Medicare Other | Admitting: Physical Therapy

## 2020-08-19 DIAGNOSIS — R2681 Unsteadiness on feet: Secondary | ICD-10-CM | POA: Diagnosis not present

## 2020-08-19 DIAGNOSIS — M6281 Muscle weakness (generalized): Secondary | ICD-10-CM

## 2020-08-19 DIAGNOSIS — R2689 Other abnormalities of gait and mobility: Secondary | ICD-10-CM | POA: Diagnosis not present

## 2020-08-19 DIAGNOSIS — R296 Repeated falls: Secondary | ICD-10-CM

## 2020-08-19 NOTE — Therapy (Signed)
[facility] [address] [place], [place], [address] Phone: [phone]   Fax:  [phone]  Physical Therapy Treatment  Patient Details  Name: Larry [name] MRN: [id number] Date of Birth: 06-29-1932 Referring Provider (PT): Earleen Newport, MD   Encounter Date: 08/19/2020   PT End of Session - 08/19/20 1548    Visit Number 3    Number of Visits 17    Date for PT Re-Evaluation 11/09/20    Authorization Type Medicare and [employer]; cert period set for 90 days due to pt will be out of town for 10 days from Sept 24th > October 7th.    Progress Note Due on Visit 10    PT Start Time 1445    PT Stop Time 1530    PT Time Calculation (min) 45 min    Activity Tolerance Patient tolerated treatment well;No increased pain    Behavior During Therapy [facility] for tasks assessed/performed           Past Medical History:  Diagnosis Date   Allergic rhinitis, cause unspecified 03/12/2013   Allergy    CAD (coronary artery disease)    S/P CABG in 1999    Cataract    Decreased exercise tolerance    Exertional fatigue   Degenerative arthritis of hip    Diverticulosis of colon    ED (erectile dysfunction)    Erectile dysfunction 06/03/2014   History of colonic polyps    History of kidney stones    History of MI (myocardial infarction)    Hx of adenomatous colonic polyps 2000   Hyperlipidemia    Low HDL   Lumbar disc disease    Hx of with recent back pain and buttock pain   Myocardial infarction ([place]) 1999   Right knee meniscal tear    S/P removal of thyroid nodule    Spinal stenosis of lumbar region 03/15/2014    Past Surgical History:  Procedure Laterality Date   CARDIAC CATHETERIZATION     CATARACT EXTRACTION     COLONOSCOPY     CORONARY ARTERY BYPASS GRAFT  1999   CYSTOSCOPY/URETEROSCOPY/HOLMIUM LASER/STENT PLACEMENT Left 12/23/2017   Procedure: CYSTOSCOPY/RETROGRADE/URETEROSCOPY/HOLMIUM  LASER/STENT PLACEMENT;  Surgeon: Festus Aloe, MD;  Location: [facility];  Service: Urology;  Laterality: Left;  ONLY NEEDS 60 MIN   LUMBAR DISC SURGERY     Nodules on Thyroid     POLYPECTOMY     PTCA     SHOULDER ARTHROSCOPY WITH ROTATOR CUFF REPAIR AND SUBACROMIAL DECOMPRESSION Right 01/30/2015   Procedure: RIGHT ARTHROSCOPY SHOULDER SUBACROMIAL DECOMPRESSION DISTAL CLAVICAL RESECTION RETATOR CUFF REPAIR;  Surgeon: Justice Britain, MD;  Location: [place];  Service: Orthopedics;  Laterality: Right;   TONSILLECTOMY     VASECTOMY      There were no vitals filed for this visit.   Subjective Assessment - 08/19/20 1446    Subjective Pt states that things are going well. He is working on his HEP regularly.    Pertinent History In 1964 had disc removed with residual right LE weakness    Diagnostic tests Xray demonstrated lumbar scoliosis and spondylolisthesis stable from a year ago.    Patient Stated Goals Would like to improve balance to be able to walk longer distances, strengthen R calf, be able to get out of a chair, turn and bend forward without fear of falling.    Currently in Pain? No/denies  [place] Adult PT Treatment/Exercise - 08/19/20 0001      Knee/Hip Exercises: Aerobic   Nustep L4 x6 min PT present to discuss HEP updates       Knee/Hip Exercises: Standing   Heel Raises Both;2 sets;10 reps    Heel Raises Limitations weight shifted     Forward Step Up Right;1 set;15 reps;Hand Hold: 1;Step Height: 6"    Forward Step Up Limitations #5 in opposite hand     Other Standing Knee Exercises #10 modified deadlift to knees x10 reps (PT cuing to decrease trunk flexion)       Knee/Hip Exercises: Seated   Sit to Sand 2 sets;10 reps   Rt LE staggered #10 kettlebell         supine exercise: bridge with Rt LE closer to body 2x10 reps       Balance Exercises - 08/19/20 0001      Balance Exercises: Standing   Standing Eyes Opened Head  turns;2 reps   x10 reps Lt/Rt    Standing Eyes Closed Narrow base of support (BOS);Solid surface;2 reps;30 secs    Tandem Stance Eyes open;2 reps;20 secs   more difficult with Lt forward            PT Education - 08/19/20 1541    Education Details updates to HEP    Person(s) Educated Patient    Methods Explanation;Verbal cues    Comprehension Verbalized understanding;Returned demonstration            PT Short Term Goals - 08/11/20 1437      PT SHORT TERM GOAL #1   Title Pt will be independent with initial HEP before he travels on Sept 24th    Time 4    Period Weeks    Status New    Target Date 09/10/20      PT SHORT TERM GOAL #2   Title Pt will improve RLE gastroc/soleus strength to be able to perform 2-3 single leg heel lifts to improve push off during gait    Baseline 0 single leg heel raise    Time 4    Period Weeks    Status New    Target Date 09/10/20      PT SHORT TERM GOAL #3   Title Pt will improve BERG balance score to >/= 45/56    Baseline 41/45    Time 4    Period Weeks    Status New    Target Date 09/10/20      PT SHORT TERM GOAL #4   Title Pt will safely negotiate curb with cane and supervision x 5 reps with improved foot clearance.    Time 4    Period Weeks    Status New    Target Date 09/10/20             PT Long Term Goals - 08/11/20 1441      PT LONG TERM GOAL #1   Title Pt will demonstrate independence with final HEP    Time 10   due to 10 days out of town   Period Weeks    Status New    Target Date 10/20/20      PT LONG TERM GOAL #2   Title Pt will demonstrate ability to perform 5-6 single leg heel lifts on RLE to improve push off for gait    Time 10    Period Weeks    Status New    Target Date 10/20/20  PT LONG TERM GOAL #3   Title Pt will improve BERG balance score to >/= 50/56 to indicate decreased risk for falls    Time 10    Period Weeks    Status New    Target Date 10/20/20      PT LONG TERM GOAL #4   Title  Pt will negotiate 12 stairs with one rail, alternating sequence without catching either foot on stairs, MOD I    Time 10    Period Weeks    Status New    Target Date 10/20/20      PT LONG TERM GOAL #5   Title Pt will demonstrate ability to stand from low chair (14") without use of UE x 5 reps in </= 12 seconds    Baseline 14 seconds from 14" chair without use of UE    Time 10    Period Weeks    Status New    Target Date 10/20/20                 Plan - 08/19/20 1548    Clinical Impression Statement Pt continues to work on his HEP consistently throughout the week. PT adjusted his current home program to emphasize the Rt LE. Pt had difficulty with standing heel raises and demonstrated compensations onto the Lt LE. PT provided resisted plantarflexion for home. Also focused on static balance with eyes closed and head turns. PT provided contact guard assistance to prevent LOB with all balance activities.    Comorbidities allergic rhinitis, CAD s/p CABG in 1999, OA of hip, h/o MI, adenomatous colonic polyps, HLD, lumbar disc disease, R knee meniscus tear, removal of thyroid nodule, lumbar spinal stenosis, shoulder arthroscopy wtih rotator cuff repair and subacromial decompression    Examination-Activity Limitations Bend;Locomotion Level;Stairs;Stand    Examination-Participation Restrictions Community Activity    Rehab Potential Good    PT Frequency 2x / week    PT Duration 12 weeks    PT Treatment/Interventions ADLs/Self Care Home Management;Aquatic Therapy;Electrical Stimulation;DME Instruction;Gait training;Stair training;Functional mobility training;Therapeutic activities;Therapeutic exercise;Balance training;Neuromuscular re-education;Patient/family education    PT Next Visit Plan review and add 10# modified dead lifts to HEP;  hallway fast "confident" walks;  step ups;  balance ex's including narrow BOS, eyes closed, turns, head movements    PT Home Exercise Plan [id number]            Patient will benefit from skilled therapeutic intervention in order to improve the following deficits and impairments:  Abnormal gait, Decreased balance, Decreased strength, Difficulty walking  Visit Diagnosis: Repeated falls  Muscle weakness (generalized)  Other abnormalities of gait and mobility  Unsteadiness on feet     Problem List Patient Active Problem List   Diagnosis Date Noted   Dysfunction of Eustachian tube, bilateral 07/21/2020   Gait disorder 04/20/2016   Hyperglycemia 04/20/2016   Abnormal CT scan, chest 06/03/2014   Erectile dysfunction 06/03/2014   Spinal stenosis of lumbar region 03/15/2014   Cough 03/12/2013   Allergic rhinitis 03/12/2013   Leg pain, bilateral 12/30/2011   Bladder neck obstruction 12/30/2011   Preventative health care 12/25/2011   Lumbar disc disease 12/25/2011   S/P removal of thyroid nodule 12/25/2011   History of MI (myocardial infarction) 12/25/2011   Right knee meniscal tear 12/25/2011   Degenerative arthritis of hip 12/25/2011   HIP PAIN, RIGHT 05/14/2010   FATIGUE 10/13/2009   HYPERTENSION, BENIGN 02/25/2009   CAD, AUTOLOGOUS BYPASS GRAFT 02/25/2009   CORONARY ARTERY ANEURYSM 02/25/2009   BACK  PAIN 04/15/2008   Hyperlipidemia 02/13/2008   CORONARY ARTERY DISEASE 02/13/2008   DIVERTICULOSIS, COLON 02/13/2008   CARDIAC DISEASE, HX OF 02/13/2008   COLONIC POLYPS, HX OF 02/13/2008    3:55 PM,08/19/20 Sherol Dade PT, DPT [facility] [address], [place], [place], [address] Phone: [phone]   Fax:  [phone]  Name: Larry [name] MRN: [id number] Date of Birth: 10-10-1932

## 2020-08-21 ENCOUNTER — Other Ambulatory Visit: Payer: Self-pay

## 2020-08-21 ENCOUNTER — Encounter: Payer: Self-pay | Admitting: Physical Therapy

## 2020-08-21 ENCOUNTER — Ambulatory Visit: Payer: Medicare Other | Attending: Neurological Surgery | Admitting: Physical Therapy

## 2020-08-21 DIAGNOSIS — R2689 Other abnormalities of gait and mobility: Secondary | ICD-10-CM | POA: Insufficient documentation

## 2020-08-21 DIAGNOSIS — M6281 Muscle weakness (generalized): Secondary | ICD-10-CM | POA: Diagnosis not present

## 2020-08-21 DIAGNOSIS — R2681 Unsteadiness on feet: Secondary | ICD-10-CM | POA: Insufficient documentation

## 2020-08-21 DIAGNOSIS — R296 Repeated falls: Secondary | ICD-10-CM | POA: Diagnosis not present

## 2020-08-21 NOTE — Therapy (Signed)
[facility] [address], [place], [place], [address] Phone: [phone]   Fax:  [phone]  Physical Therapy Treatment  Patient Details  Name: Larry [name] MRN: [id number] Date of Birth: 1932-11-08 Referring Provider (PT): Earleen Newport, MD   Encounter Date: 08/21/2020   PT End of Session - 08/21/20 1143    Visit Number 4    Number of Visits 17    Date for PT Re-Evaluation 11/09/20    Authorization Type Medicare and [employer]; cert period set for 90 days due to pt will be out of town for 10 days from Sept 24th > October 7th.    Progress Note Due on Visit 10    PT Start Time 1102    PT Stop Time 1145    PT Time Calculation (min) 43 min    Activity Tolerance Patient tolerated treatment well;No increased pain    Behavior During Therapy [facility] for tasks assessed/performed           Past Medical History:  Diagnosis Date   Allergic rhinitis, cause unspecified 03/12/2013   Allergy    CAD (coronary artery disease)    S/P CABG in 1999    Cataract    Decreased exercise tolerance    Exertional fatigue   Degenerative arthritis of hip    Diverticulosis of colon    ED (erectile dysfunction)    Erectile dysfunction 06/03/2014   History of colonic polyps    History of kidney stones    History of MI (myocardial infarction)    Hx of adenomatous colonic polyps 2000   Hyperlipidemia    Low HDL   Lumbar disc disease    Hx of with recent back pain and buttock pain   Myocardial infarction ([place]) 1999   Right knee meniscal tear    S/P removal of thyroid nodule    Spinal stenosis of lumbar region 03/15/2014    Past Surgical History:  Procedure Laterality Date   CARDIAC CATHETERIZATION     CATARACT EXTRACTION     COLONOSCOPY     CORONARY ARTERY BYPASS GRAFT  1999   CYSTOSCOPY/URETEROSCOPY/HOLMIUM LASER/STENT PLACEMENT Left 12/23/2017   Procedure: CYSTOSCOPY/RETROGRADE/URETEROSCOPY/HOLMIUM  LASER/STENT PLACEMENT;  Surgeon: Festus Aloe, MD;  Location: [facility];  Service: Urology;  Laterality: Left;  ONLY NEEDS 60 MIN   LUMBAR DISC SURGERY     Nodules on Thyroid     POLYPECTOMY     PTCA     SHOULDER ARTHROSCOPY WITH ROTATOR CUFF REPAIR AND SUBACROMIAL DECOMPRESSION Right 01/30/2015   Procedure: RIGHT ARTHROSCOPY SHOULDER SUBACROMIAL DECOMPRESSION DISTAL CLAVICAL RESECTION RETATOR CUFF REPAIR;  Surgeon: Justice Britain, MD;  Location: [place];  Service: Orthopedics;  Laterality: Right;   TONSILLECTOMY     VASECTOMY      There were no vitals filed for this visit.   Subjective Assessment - 08/21/20 1110    Subjective Pt reports he was able to purchase dumbbells and has been working on his HEP consistently.    Pertinent History In 1964 had disc removed with residual right LE weakness    Diagnostic tests Xray demonstrated lumbar scoliosis and spondylolisthesis stable from a year ago.    Patient Stated Goals Would like to improve balance to be able to walk longer distances, strengthen R calf, be able to get out of a chair, turn and bend forward without fear of falling.    Currently in Pain? No/denies  [place] Adult PT Treatment/Exercise - 08/21/20 0001      Knee/Hip Exercises: Aerobic   Nustep L4 intervals x8 min PT present to discuss home program       Knee/Hip Exercises: Machines for Strengthening   Cybex Knee Flexion Rt LE only: #15 2x10 reps     Total Gym Leg Press Seat 7: BLE #90 2x10 reps, Rt LE #30 x10 and #40x10      Knee/Hip Exercises: Standing   Step Down Right;2 sets;10 reps;Hand Hold: 2;Step Height: 6"      Knee/Hip Exercises: Seated   Other Seated Knee/Hip Exercises Lt ankle inversion red TB 2x10 reps                Balance Exercises - 08/21/20 0001      Balance Exercises: Standing   Other Standing Exercises rebounder: finger support- 1 minute trials weight shifting Lt/Rt, forward/back, marching, rocking  heel/toe              PT Education - 08/21/20 1143    Education Details technique with therex    Person(s) Educated Patient    Methods Explanation;Verbal cues    Comprehension Verbalized understanding;Returned demonstration            PT Short Term Goals - 08/11/20 1437      PT SHORT TERM GOAL #1   Title Pt will be independent with initial HEP before he travels on Sept 24th    Time 4    Period Weeks    Status New    Target Date 09/10/20      PT SHORT TERM GOAL #2   Title Pt will improve RLE gastroc/soleus strength to be able to perform 2-3 single leg heel lifts to improve push off during gait    Baseline 0 single leg heel raise    Time 4    Period Weeks    Status New    Target Date 09/10/20      PT SHORT TERM GOAL #3   Title Pt will improve BERG balance score to >/= 45/56    Baseline 41/45    Time 4    Period Weeks    Status New    Target Date 09/10/20      PT SHORT TERM GOAL #4   Title Pt will safely negotiate curb with cane and supervision x 5 reps with improved foot clearance.    Time 4    Period Weeks    Status New    Target Date 09/10/20             PT Long Term Goals - 08/11/20 1441      PT LONG TERM GOAL #1   Title Pt will demonstrate independence with final HEP    Time 10   due to 10 days out of town   Period Weeks    Status New    Target Date 10/20/20      PT LONG TERM GOAL #2   Title Pt will demonstrate ability to perform 5-6 single leg heel lifts on RLE to improve push off for gait    Time 10    Period Weeks    Status New    Target Date 10/20/20      PT LONG TERM GOAL #3   Title Pt will improve BERG balance score to >/= 50/56 to indicate decreased risk for falls    Time 10    Period Weeks    Status New    Target Date 10/20/20  PT LONG TERM GOAL #4   Title Pt will negotiate 12 stairs with one rail, alternating sequence without catching either foot on stairs, MOD I    Time 10    Period Weeks    Status New    Target Date  10/20/20      PT LONG TERM GOAL #5   Title Pt will demonstrate ability to stand from low chair (14") without use of UE x 5 reps in </= 12 seconds    Baseline 14 seconds from 14" chair without use of UE    Time 10    Period Weeks    Status New    Target Date 10/20/20                 Plan - 08/21/20 1145    Clinical Impression Statement Pt continues to work on his HEP and was able to purchase some dumbbells for his exercises. Session focused on therex progressions and targeting Rt LE with leg press and other machines in the gym. Pt required PT cuing to focus on his technique and avoid rushing throughout exercises. He was able to complete balance activity on the rebounder with intermittent UE support. Will continue with current POC.    Comorbidities allergic rhinitis, CAD s/p CABG in 1999, OA of hip, h/o MI, adenomatous colonic polyps, HLD, lumbar disc disease, R knee meniscus tear, removal of thyroid nodule, lumbar spinal stenosis, shoulder arthroscopy wtih rotator cuff repair and subacromial decompression    Examination-Activity Limitations Bend;Locomotion Level;Stairs;Stand    Examination-Participation Restrictions Community Activity    Rehab Potential Good    PT Frequency 2x / week    PT Duration 12 weeks    PT Treatment/Interventions ADLs/Self Care Home Management;Aquatic Therapy;Electrical Stimulation;DME Instruction;Gait training;Stair training;Functional mobility training;Therapeutic activities;Therapeutic exercise;Balance training;Neuromuscular re-education;Patient/family education    PT Next Visit Plan review and add 10# modified dead lifts to HEP;  hallway fast "confident" walks;  step ups;  balance ex's including narrow BOS, eyes closed, turns, head movements    PT Home Exercise Plan [id number]           Patient will benefit from skilled therapeutic intervention in order to improve the following deficits and impairments:  Abnormal gait, Decreased balance, Decreased strength,  Difficulty walking  Visit Diagnosis: Repeated falls  Muscle weakness (generalized)  Other abnormalities of gait and mobility  Unsteadiness on feet     Problem List Patient Active Problem List   Diagnosis Date Noted   Dysfunction of Eustachian tube, bilateral 07/21/2020   Gait disorder 04/20/2016   Hyperglycemia 04/20/2016   Abnormal CT scan, chest 06/03/2014   Erectile dysfunction 06/03/2014   Spinal stenosis of lumbar region 03/15/2014   Cough 03/12/2013   Allergic rhinitis 03/12/2013   Leg pain, bilateral 12/30/2011   Bladder neck obstruction 12/30/2011   Preventative health care 12/25/2011   Lumbar disc disease 12/25/2011   S/P removal of thyroid nodule 12/25/2011   History of MI (myocardial infarction) 12/25/2011   Right knee meniscal tear 12/25/2011   Degenerative arthritis of hip 12/25/2011   HIP PAIN, RIGHT 05/14/2010   FATIGUE 10/13/2009   HYPERTENSION, BENIGN 02/25/2009   CAD, AUTOLOGOUS BYPASS GRAFT 02/25/2009   CORONARY ARTERY ANEURYSM 02/25/2009   BACK PAIN 04/15/2008   Hyperlipidemia 02/13/2008   CORONARY ARTERY DISEASE 02/13/2008   DIVERTICULOSIS, COLON 02/13/2008   CARDIAC DISEASE, HX OF 02/13/2008   COLONIC POLYPS, HX OF 02/13/2008    12:28 PM,08/21/20 Sherol Dade PT, DPT [place] at  [facility] [address] [place], [place], [address] Phone: [phone]   Fax:  [phone]  Name: Joseff Luckman MRN: [id number] Date of Birth: 1932-01-24

## 2020-08-26 ENCOUNTER — Encounter: Payer: Medicare Other | Admitting: Physical Therapy

## 2020-08-29 ENCOUNTER — Other Ambulatory Visit: Payer: Self-pay

## 2020-08-29 ENCOUNTER — Encounter: Payer: Self-pay | Admitting: Physical Therapy

## 2020-08-29 ENCOUNTER — Ambulatory Visit: Payer: Medicare Other | Admitting: Physical Therapy

## 2020-08-29 DIAGNOSIS — M6281 Muscle weakness (generalized): Secondary | ICD-10-CM

## 2020-08-29 DIAGNOSIS — R2689 Other abnormalities of gait and mobility: Secondary | ICD-10-CM | POA: Diagnosis not present

## 2020-08-29 DIAGNOSIS — R296 Repeated falls: Secondary | ICD-10-CM

## 2020-08-29 DIAGNOSIS — R2681 Unsteadiness on feet: Secondary | ICD-10-CM | POA: Diagnosis not present

## 2020-08-29 NOTE — Therapy (Signed)
[facility] [address], [place], [place], [address] Phone: [phone]   Fax:  [phone]  Physical Therapy Treatment  Patient Details  Name: Larry [name] MRN: [id number] Date of Birth: [birth year] Referring Provider (PT): Earleen Newport, MD   Encounter Date: 08/29/2020   PT End of Session - 08/29/20 0931    Visit Number 5    Number of Visits 17    Date for PT Re-Evaluation 11/09/20    Authorization Type Medicare and [employer]; cert period set for 90 days due to pt will be out of town for 10 days from Sept 24th > October 7th.    Progress Note Due on Visit 10    PT Start Time [phone]    PT Stop Time 1009    PT Time Calculation (min) 38 min    Activity Tolerance Patient tolerated treatment well    Behavior During Therapy WFL for tasks assessed/performed           Past Medical History:  Diagnosis Date  . Allergic rhinitis, cause unspecified 03/12/2013  . Allergy   . CAD (coronary artery disease)    S/P CABG in 1999   . Cataract   . Decreased exercise tolerance    Exertional fatigue  . Degenerative arthritis of hip   . Diverticulosis of colon   . ED (erectile dysfunction)   . Erectile dysfunction 06/03/2014  . History of colonic polyps   . History of kidney stones   . History of MI (myocardial infarction)   . Hx of adenomatous colonic polyps 2000  . Hyperlipidemia    Low HDL  . Lumbar disc disease    Hx of with recent back pain and buttock pain  . Myocardial infarction ([place]) 1999  . Right knee meniscal tear   . S/P removal of thyroid nodule   . Spinal stenosis of lumbar region 03/15/2014    Past Surgical History:  Procedure Laterality Date  . CARDIAC CATHETERIZATION    . CATARACT EXTRACTION    . COLONOSCOPY    . CORONARY ARTERY BYPASS GRAFT  1999  . CYSTOSCOPY/URETEROSCOPY/HOLMIUM LASER/STENT PLACEMENT Left 12/23/2017   Procedure: CYSTOSCOPY/RETROGRADE/URETEROSCOPY/HOLMIUM LASER/STENT  PLACEMENT;  Surgeon: Festus Aloe, MD;  Location: [facility];  Service: Urology;  Laterality: Left;  ONLY NEEDS 60 MIN  . LUMBAR DISC SURGERY    . Nodules on Thyroid    . POLYPECTOMY    . PTCA    . SHOULDER ARTHROSCOPY WITH ROTATOR CUFF REPAIR AND SUBACROMIAL DECOMPRESSION Right 01/30/2015   Procedure: RIGHT ARTHROSCOPY SHOULDER SUBACROMIAL DECOMPRESSION DISTAL CLAVICAL RESECTION RETATOR CUFF REPAIR;  Surgeon: Justice Britain, MD;  Location: [facility];  Service: Orthopedics;  Laterality: Right;  . TONSILLECTOMY    . VASECTOMY      There were no vitals filed for this visit.   Subjective Assessment - 08/29/20 0933    Subjective I didn't feel so good Monday but better today.    Pertinent History In 1964 had disc removed with residual right LE weakness    Currently in Pain? No/denies    Multiple Pain Sites No                             [facility] Adult PT Treatment/Exercise - 08/29/20 0001      Neuro Re-ed    Neuro Re-ed Details  Walking with head turns and nods no cane, close SBA.       Knee/Hip Exercises:  Stretches   Active Hamstring Stretch Both;2 reps;20 seconds    Gastroc Stretch Both;2 reps;20 seconds      Knee/Hip Exercises: Aerobic   Nustep L4 intervals x8 min PTA present      Knee/Hip Exercises: Standing   Gait Training Confident walking with VC to increase arm swing and step length. Multiple attempts    Other Standing Knee Exercises 10# modified deadlifts 6 " box to full stand 2x5      Knee/Hip Exercises: Seated   Sit to Sand --   2x5 with 10#,                    PT Short Term Goals - 08/29/20 1010      PT SHORT TERM GOAL #1   Title Pt will be independent with initial HEP before he travels on Sept 24th    Period Weeks    Status Achieved    Target Date 09/10/20             PT Long Term Goals - 08/11/20 1441      PT LONG TERM GOAL #1   Title Pt will demonstrate independence with final HEP    Time 10   due to 10 days out of town   Period  Weeks    Status New    Target Date 10/20/20      PT LONG TERM GOAL #2   Title Pt will demonstrate ability to perform 5-6 single leg heel lifts on RLE to improve push off for gait    Time 10    Period Weeks    Status New    Target Date 10/20/20      PT LONG TERM GOAL #3   Title Pt will improve BERG balance score to >/= 50/56 to indicate decreased risk for falls    Time 10    Period Weeks    Status New    Target Date 10/20/20      PT LONG TERM GOAL #4   Title Pt will negotiate 12 stairs with one rail, alternating sequence without catching either foot on stairs, MOD I    Time 10    Period Weeks    Status New    Target Date 10/20/20      PT LONG TERM GOAL #5   Title Pt will demonstrate ability to stand from low chair (14") without use of UE x 5 reps in </= 12 seconds    Baseline 14 seconds from 14" chair without use of UE    Time 10    Period Weeks    Status New    Target Date 10/20/20                 Plan - 08/29/20 0932    Clinical Impression Statement Pt was sick early i nweek and missed appointment. Feeling better, feeling stronger, presents with no pain. Added hamstring and gastroc stretch to HEP to aide in increasing step length. Pt may need review of standing gastroc stretch next session. Pt demonstrated improved gait specifically speed througout todays session even with slow head turns & nods.    Personal Factors and Comorbidities Comorbidity 3+;Age    Comorbidities allergic rhinitis, CAD s/p CABG in 1999, OA of hip, h/o MI, adenomatous colonic polyps, HLD, lumbar disc disease, R knee meniscus tear, removal of thyroid nodule, lumbar spinal stenosis, shoulder arthroscopy wtih rotator cuff repair and subacromial decompression    Examination-Activity Limitations Bend;Locomotion Level;Stairs;Stand    Examination-Participation Restrictions  Community Activity    Stability/Clinical Decision Making Stable/Uncomplicated    Rehab Potential Good    PT Frequency 2x / week      PT Duration 12 weeks    PT Treatment/Interventions ADLs/Self Care Home Management;Aquatic Therapy;Electrical Stimulation;DME Instruction;Gait training;Stair training;Functional mobility training;Therapeutic activities;Therapeutic exercise;Balance training;Neuromuscular re-education;Patient/family education    PT Next Visit Plan BERD in next 1-2 visits, curbs    PT Home Exercise Plan [email or web address]    Consulted and Agree with Plan of Care Patient;Family member/caregiver           Patient will benefit from skilled therapeutic intervention in order to improve the following deficits and impairments:  Abnormal gait, Decreased balance, Decreased strength, Difficulty walking  Visit Diagnosis: Repeated falls  Muscle weakness (generalized)  Other abnormalities of gait and mobility  Unsteadiness on feet     Problem List Patient Active Problem List   Diagnosis Date Noted  . Dysfunction of Eustachian tube, bilateral 07/21/2020  . Gait disorder 04/20/2016  . Hyperglycemia 04/20/2016  . Abnormal CT scan, chest 06/03/2014  . Erectile dysfunction 06/03/2014  . Spinal stenosis of lumbar region 03/15/2014  . Cough 03/12/2013  . Allergic rhinitis 03/12/2013  . Leg pain, bilateral 12/30/2011  . Bladder neck obstruction 12/30/2011  . Preventative health care 12/25/2011  . Lumbar disc disease 12/25/2011  . S/P removal of thyroid nodule 12/25/2011  . History of MI (myocardial infarction) 12/25/2011  . Right knee meniscal tear 12/25/2011  . Degenerative arthritis of hip 12/25/2011  . HIP PAIN, RIGHT 05/14/2010  . FATIGUE 10/13/2009  . HYPERTENSION, BENIGN 02/25/2009  . CAD, AUTOLOGOUS BYPASS GRAFT 02/25/2009  . CORONARY ARTERY ANEURYSM 02/25/2009  . BACK PAIN 04/15/2008  . Hyperlipidemia 02/13/2008  . CORONARY ARTERY DISEASE 02/13/2008  . DIVERTICULOSIS, COLON 02/13/2008  . CARDIAC DISEASE, HX OF 02/13/2008  . COLONIC POLYPS, HX OF 02/13/2008    Yaneisy Wenz 08/29/2020, 10:11  AM  [facility] [address], [place], [place], [address] Phone: [phone]   Fax:  [phone]  Name: Larry [name] MRN: [id number] Date of Birth: 07-Jun-1932

## 2020-09-01 DIAGNOSIS — L738 Other specified follicular disorders: Secondary | ICD-10-CM | POA: Diagnosis not present

## 2020-09-01 DIAGNOSIS — L578 Other skin changes due to chronic exposure to nonionizing radiation: Secondary | ICD-10-CM | POA: Diagnosis not present

## 2020-09-01 DIAGNOSIS — L821 Other seborrheic keratosis: Secondary | ICD-10-CM | POA: Diagnosis not present

## 2020-09-01 DIAGNOSIS — L570 Actinic keratosis: Secondary | ICD-10-CM | POA: Diagnosis not present

## 2020-09-01 DIAGNOSIS — L814 Other melanin hyperpigmentation: Secondary | ICD-10-CM | POA: Diagnosis not present

## 2020-09-01 DIAGNOSIS — Z85828 Personal history of other malignant neoplasm of skin: Secondary | ICD-10-CM | POA: Diagnosis not present

## 2020-09-03 ENCOUNTER — Telehealth: Payer: Self-pay | Admitting: *Deleted

## 2020-09-03 NOTE — Telephone Encounter (Signed)
   Primary Cardiologist: Lauree Chandler, MD  Chart reviewed as part of pre-operative protocol coverage.   Simple dental extractions are considered low risk procedures per guidelines and generally do not require any specific cardiac clearance. It is also generally accepted that for simple extractions and dental cleanings, there is no need to interrupt blood thinner therapy.  SBE prophylaxis is not required for the patient.  I will route this recommendation to the requesting party via Epic fax function and remove from pre-op pool.  Please call with questions.  Abigail Butts, PA-C 09/03/2020, 4:04 PM

## 2020-09-03 NOTE — Telephone Encounter (Signed)
Wife of the patient called. The patient is in intense pain and wanted to have this procedure done ASAP. Please advise

## 2020-09-03 NOTE — Telephone Encounter (Signed)
° °  [facility] Pre-operative Risk Assessment    HEARTCARE STAFF: - Please ensure there is not already an duplicate clearance open for this procedure. - Under Visit Info/Reason for Call, type in Other and utilize the format Clearance MM/DD/YY or Clearance TBD. Do not use dashes or single digits. - If request is for dental extraction, please clarify the # of teeth to be extracted.  Request for surgical clearance:  1. What type of surgery is being performed?  SINGLE TOOTH EXTRACTION   2. When is this surgery scheduled?  TBD   3. What type of clearance is required (medical clearance vs. Pharmacy clearance to hold med vs. Both)?  MEDICAL  4. Are there any medications that need to be held prior to surgery and how long? N/A   5. Practice name and name of physician performing surgery?  [facility]   6. What is the office phone number?  [phone]   7.   What is the office fax number?  [phone]  8.   Anesthesia type (None, local, MAC, general) ?  IV ANESTHESIA (VERSED & FENTANYL)   Jeanann Lewandowsky 09/03/2020, 10:06 AM  _________________________________________________________________   (provider comments below)

## 2020-09-04 ENCOUNTER — Encounter: Payer: Self-pay | Admitting: Physical Therapy

## 2020-09-04 ENCOUNTER — Ambulatory Visit: Payer: Medicare Other | Admitting: Physical Therapy

## 2020-09-04 ENCOUNTER — Other Ambulatory Visit: Payer: Self-pay

## 2020-09-04 DIAGNOSIS — R2689 Other abnormalities of gait and mobility: Secondary | ICD-10-CM

## 2020-09-04 DIAGNOSIS — R296 Repeated falls: Secondary | ICD-10-CM

## 2020-09-04 DIAGNOSIS — M6281 Muscle weakness (generalized): Secondary | ICD-10-CM | POA: Diagnosis not present

## 2020-09-04 DIAGNOSIS — R2681 Unsteadiness on feet: Secondary | ICD-10-CM

## 2020-09-04 NOTE — Therapy (Signed)
[facility] [address] [place], [place], [address] Phone: [phone]   Fax:  [phone]  Physical Therapy Treatment  Patient Details  Name: Larry [name] MRN: [id number] Date of Birth: 01-01-1932 Referring Provider (PT): Earleen Newport, MD   Encounter Date: 09/04/2020   PT End of Session - 09/04/20 1409    Visit Number 6    Number of Visits 17    Date for PT Re-Evaluation 11/09/20    Authorization Type Medicare and [employer]; cert period set for 90 days due to pt will be out of town for 10 days from Sept 24th > October 7th.    Progress Note Due on Visit 10    PT Start Time 1400    PT Stop Time 1440    PT Time Calculation (min) 40 min    Activity Tolerance Patient tolerated treatment well;No increased pain    Behavior During Therapy WFL for tasks assessed/performed           Past Medical History:  Diagnosis Date  . Allergic rhinitis, cause unspecified 03/12/2013  . Allergy   . CAD (coronary artery disease)    S/P CABG in 1999   . Cataract   . Decreased exercise tolerance    Exertional fatigue  . Degenerative arthritis of hip   . Diverticulosis of colon   . ED (erectile dysfunction)   . Erectile dysfunction 06/03/2014  . History of colonic polyps   . History of kidney stones   . History of MI (myocardial infarction)   . Hx of adenomatous colonic polyps 2000  . Hyperlipidemia    Low HDL  . Lumbar disc disease    Hx of with recent back pain and buttock pain  . Myocardial infarction ([place]) 1999  . Right knee meniscal tear   . S/P removal of thyroid nodule   . Spinal stenosis of lumbar region 03/15/2014    Past Surgical History:  Procedure Laterality Date  . CARDIAC CATHETERIZATION    . CATARACT EXTRACTION    . COLONOSCOPY    . CORONARY ARTERY BYPASS GRAFT  1999  . CYSTOSCOPY/URETEROSCOPY/HOLMIUM LASER/STENT PLACEMENT Left 12/23/2017   Procedure: CYSTOSCOPY/RETROGRADE/URETEROSCOPY/HOLMIUM  LASER/STENT PLACEMENT;  Surgeon: Festus Aloe, MD;  Location: [facility];  Service: Urology;  Laterality: Left;  ONLY NEEDS 60 MIN  . LUMBAR DISC SURGERY    . Nodules on Thyroid    . POLYPECTOMY    . PTCA    . SHOULDER ARTHROSCOPY WITH ROTATOR CUFF REPAIR AND SUBACROMIAL DECOMPRESSION Right 01/30/2015   Procedure: RIGHT ARTHROSCOPY SHOULDER SUBACROMIAL DECOMPRESSION DISTAL CLAVICAL RESECTION RETATOR CUFF REPAIR;  Surgeon: Justice Britain, MD;  Location: [place];  Service: Orthopedics;  Laterality: Right;  . TONSILLECTOMY    . VASECTOMY      There were no vitals filed for this visit.   Subjective Assessment - 09/04/20 1408    Subjective Pt states that things are going well. He is doing his HEP but had to miss a day or so while he wasn't feeling well.    Pertinent History In 1964 had disc removed with residual right LE weakness    Currently in Pain? No/denies              [facility] PT Assessment - 09/04/20 0001      Berg Balance Test   Sit to Stand Able to stand without using hands and stabilize independently    Standing Unsupported Able to stand safely 2 minutes    Sitting  with Back Unsupported but Feet Supported on Floor or Stool Able to sit safely and securely 2 minutes    Stand to Sit Sits safely with minimal use of hands    Transfers Able to transfer safely, minor use of hands    Standing Unsupported with Eyes Closed Able to stand 10 seconds with supervision    Standing Unsupported with Feet Together Able to place feet together independently and stand 1 minute safely    From Standing, Reach Forward with Outstretched Arm Can reach forward >12 cm safely (5")   slowly   From Standing Position, Pick up Object from Floor Able to pick up shoe safely and easily    From Standing Position, Turn to Look Behind Over each Shoulder Looks behind one side only/other side shows less weight shift    Turn 360 Degrees Able to turn 360 degrees safely one side only in 4 seconds or less    Standing  Unsupported, Alternately Place Feet on Step/Stool Able to complete 4 steps without aid or supervision    Standing Unsupported, One Foot in Front Able to plae foot ahead of the other independently and hold 30 seconds    Standing on One Leg Tries to lift leg/unable to hold 3 seconds but remains standing independently    Total Score 46                         [facility] Adult PT Treatment/Exercise - 09/04/20 0001      Knee/Hip Exercises: Machines for Strengthening   Total Gym Leg Press Seat 7: BLE #90 2x10 reps, Rt LE #40 x15 and #50x15      Knee/Hip Exercises: Standing   Other Standing Knee Exercises rocker board x20 reps: Tactile cues to avoid body movement               Balance Exercises - 09/04/20 0001      Balance Exercises: Standing   Tandem Stance Eyes open;2 reps;20 secs   UE support    Standing, One Foot on a Step Eyes open;6 inch;2 reps;10 secs   UE support             PT Education - 09/04/20 1429    Education Details results of BERG    Person(s) Educated Patient    Methods Explanation    Comprehension Verbalized understanding            PT Short Term Goals - 08/29/20 1010      PT SHORT TERM GOAL #1   Title Pt will be independent with initial HEP before he travels on Sept 24th    Period Weeks    Status Achieved    Target Date 09/10/20             PT Long Term Goals - 09/04/20 1443      PT LONG TERM GOAL #1   Title Pt will demonstrate independence with final HEP    Time 10   due to 10 days out of town   Period Weeks    Status New      PT LONG TERM GOAL #2   Title Pt will demonstrate ability to perform 5-6 single leg heel lifts on RLE to improve push off for gait    Time 10    Period Weeks    Status New      PT LONG TERM GOAL #3   Title Pt will improve BERG balance score to >/= 50/56 to indicate  decreased risk for falls    Baseline 46/56    Time 10    Period Weeks    Status New      PT LONG TERM GOAL #4   Title Pt will  negotiate 12 stairs with one rail, alternating sequence without catching either foot on stairs, MOD I    Time 10    Period Weeks    Status New      PT LONG TERM GOAL #5   Title Pt will demonstrate ability to stand from low chair (14") without use of UE x 5 reps in </= 12 seconds    Baseline 14 seconds from 14" chair without use of UE    Time 10    Period Weeks    Status New                 Plan - 09/04/20 1442    Clinical Impression Statement Pt is making steady progress towards his goals. His BERG score improved from 41 points up to 46 points. PT updated his HEP to address some of his balance limitations. Pt was able to complete progressions in resistance with the leg press machine. He would continue to benefit from skilled PT to address limitations in flexibility, strength and balance.    Personal Factors and Comorbidities Comorbidity 3+;Age    Comorbidities allergic rhinitis, CAD s/p CABG in 1999, OA of hip, h/o MI, adenomatous colonic polyps, HLD, lumbar disc disease, R knee meniscus tear, removal of thyroid nodule, lumbar spinal stenosis, shoulder arthroscopy wtih rotator cuff repair and subacromial decompression    Examination-Activity Limitations Bend;Locomotion Level;Stairs;Stand    Examination-Participation Restrictions Community Activity    Stability/Clinical Decision Making Stable/Uncomplicated    Rehab Potential Good    PT Frequency 2x / week    PT Duration 12 weeks    PT Treatment/Interventions ADLs/Self Care Home Management;Aquatic Therapy;Electrical Stimulation;DME Instruction;Gait training;Stair training;Functional mobility training;Therapeutic activities;Therapeutic exercise;Balance training;Neuromuscular re-education;Patient/family education    PT Next Visit Plan balance with focus on NBOS; LE strength progression-increase sets or resistance with leg press next visit    PT Home Exercise Plan [email or web address]    Consulted and Agree with Plan of Care Patient;Family  member/caregiver           Patient will benefit from skilled therapeutic intervention in order to improve the following deficits and impairments:  Abnormal gait, Decreased balance, Decreased strength, Difficulty walking  Visit Diagnosis: Repeated falls  Muscle weakness (generalized)  Other abnormalities of gait and mobility  Unsteadiness on feet     Problem List Patient Active Problem List   Diagnosis Date Noted  . Dysfunction of Eustachian tube, bilateral 07/21/2020  . Gait disorder 04/20/2016  . Hyperglycemia 04/20/2016  . Abnormal CT scan, chest 06/03/2014  . Erectile dysfunction 06/03/2014  . Spinal stenosis of lumbar region 03/15/2014  . Cough 03/12/2013  . Allergic rhinitis 03/12/2013  . Leg pain, bilateral 12/30/2011  . Bladder neck obstruction 12/30/2011  . Preventative health care 12/25/2011  . Lumbar disc disease 12/25/2011  . S/P removal of thyroid nodule 12/25/2011  . History of MI (myocardial infarction) 12/25/2011  . Right knee meniscal tear 12/25/2011  . Degenerative arthritis of hip 12/25/2011  . HIP PAIN, RIGHT 05/14/2010  . FATIGUE 10/13/2009  . HYPERTENSION, BENIGN 02/25/2009  . CAD, AUTOLOGOUS BYPASS GRAFT 02/25/2009  . CORONARY ARTERY ANEURYSM 02/25/2009  . BACK PAIN 04/15/2008  . Hyperlipidemia 02/13/2008  . CORONARY ARTERY DISEASE 02/13/2008  . DIVERTICULOSIS, COLON 02/13/2008  . CARDIAC DISEASE, HX  OF 02/13/2008  . COLONIC POLYPS, HX OF 02/13/2008    2:47 PM,09/04/20 Sherol Dade PT, DPT [facility] [address], [place], [place], [address] Phone: [phone]   Fax:  [phone]  Name: Larry [name] MRN: [id number] Date of Birth: 04-Nov-1932

## 2020-09-04 NOTE — Patient Instructions (Signed)
Access Code: [email or web address]: [email or web address]: 09/16/2021Prepared by: [place] to Stand - 1 x daily - 7 x weekly - 2 sets - 10 reps  Forward Step Up with Counter Support - 1 x daily - 7 x weekly - 2 sets - 10 reps  Heel rises with counter support - 1 x daily - 7 x weekly - 2 sets - 10 reps  Bridge - 1 x daily - 7 x weekly - 3 sets - 10 reps  Seated Hamstring Stretch - 2 x daily - 7 x weekly - 3 sets - 3 reps - 30 hold  Standing Gastroc Stretch - 2 x daily - 7 x weekly - 3 sets - 3 reps - 30 hold  Standing Tandem Balance with Counter Support - 1 x daily - 7 x weekly - 2 reps - 30 seconds hold  Standing Single Leg Stance with Counter Support - 1 x daily - 7 x weekly - 3 reps - 10 econds hold  [facility] [address] [place], [place] [address] Phone # [phone] Fax [phone]

## 2020-09-10 ENCOUNTER — Other Ambulatory Visit: Payer: Self-pay

## 2020-09-10 ENCOUNTER — Ambulatory Visit: Payer: Medicare Other

## 2020-09-10 DIAGNOSIS — R2689 Other abnormalities of gait and mobility: Secondary | ICD-10-CM

## 2020-09-10 DIAGNOSIS — R2681 Unsteadiness on feet: Secondary | ICD-10-CM | POA: Diagnosis not present

## 2020-09-10 DIAGNOSIS — R296 Repeated falls: Secondary | ICD-10-CM

## 2020-09-10 DIAGNOSIS — M6281 Muscle weakness (generalized): Secondary | ICD-10-CM | POA: Diagnosis not present

## 2020-09-10 NOTE — Therapy (Signed)
[facility] [address], [place], [place], [address] Phone: [phone]   Fax:  [phone]  Physical Therapy Treatment  Patient Details  Name: Larry [name] MRN: [id number] Date of Birth: September 08, 1932 Referring Provider (PT): Earleen Newport, MD   Encounter Date: 09/10/2020   PT End of Session - 09/10/20 5400    Visit Number 7    Date for PT Re-Evaluation 11/09/20    Authorization Type Medicare and [employer]; cert period set for 90 days due to pt will be out of town for 10 days from Sept 24th > October 7th.    Progress Note Due on Visit 10    PT Start Time 1531    PT Stop Time 1612    PT Time Calculation (min) 41 min    Activity Tolerance Patient tolerated treatment well;No increased pain    Behavior During Therapy WFL for tasks assessed/performed           Past Medical History:  Diagnosis Date  . Allergic rhinitis, cause unspecified 03/12/2013  . Allergy   . CAD (coronary artery disease)    S/P CABG in 1999   . Cataract   . Decreased exercise tolerance    Exertional fatigue  . Degenerative arthritis of hip   . Diverticulosis of colon   . ED (erectile dysfunction)   . Erectile dysfunction 06/03/2014  . History of colonic polyps   . History of kidney stones   . History of MI (myocardial infarction)   . Hx of adenomatous colonic polyps 2000  . Hyperlipidemia    Low HDL  . Lumbar disc disease    Hx of with recent back pain and buttock pain  . Myocardial infarction ([place]) 1999  . Right knee meniscal tear   . S/P removal of thyroid nodule   . Spinal stenosis of lumbar region 03/15/2014    Past Surgical History:  Procedure Laterality Date  . CARDIAC CATHETERIZATION    . CATARACT EXTRACTION    . COLONOSCOPY    . CORONARY ARTERY BYPASS GRAFT  1999  . CYSTOSCOPY/URETEROSCOPY/HOLMIUM LASER/STENT PLACEMENT Left 12/23/2017   Procedure: CYSTOSCOPY/RETROGRADE/URETEROSCOPY/HOLMIUM LASER/STENT PLACEMENT;   Surgeon: Festus Aloe, MD;  Location: [facility];  Service: Urology;  Laterality: Left;  ONLY NEEDS 60 MIN  . LUMBAR DISC SURGERY    . Nodules on Thyroid    . POLYPECTOMY    . PTCA    . SHOULDER ARTHROSCOPY WITH ROTATOR CUFF REPAIR AND SUBACROMIAL DECOMPRESSION Right 01/30/2015   Procedure: RIGHT ARTHROSCOPY SHOULDER SUBACROMIAL DECOMPRESSION DISTAL CLAVICAL RESECTION RETATOR CUFF REPAIR;  Surgeon: Justice Britain, MD;  Location: [facility];  Service: Orthopedics;  Laterality: Right;  . TONSILLECTOMY    . VASECTOMY      There were no vitals filed for this visit.   Subjective Assessment - 09/10/20 1537    Subjective I'm doing good with my homework.    Patient Stated Goals Would like to improve balance to be able to walk longer distances, strengthen R calf, be able to get out of a chair, turn and bend forward without fear of falling.    Currently in Pain? No/denies                             [facility] Adult PT Treatment/Exercise - 09/10/20 0001      Knee/Hip Exercises: Aerobic   Nustep Level 2x 10 minutes    good endurance with this  Knee/Hip Exercises: Machines for Strengthening   Total Gym Leg Press Seat 7: BLE #90 2x10 reps, Rt & Lt LE 50# 2x10      Knee/Hip Exercises: Standing   Heel Raises Both;2 sets;10 reps    Rocker Board 2 minutes    Rocker Board Limitations good postural alignment    Other Standing Knee Exercises alternating step-taps at edge of treadmill 3x10 bil each      Knee/Hip Exercises: Seated   Sit to Sand 2 sets;10 reps   holding 5# kettle bell              Balance Exercises - 09/10/20 0001      Balance Exercises: Standing   Tandem Stance Eyes open;2 reps;20 secs   UE support               PT Short Term Goals - 09/10/20 1538      PT SHORT TERM GOAL #1   Title Pt will be independent with initial HEP before he travels on Sept 24th    Status Achieved      PT SHORT TERM GOAL #3   Title Pt will improve BERG balance score to >/=  45/56    Baseline 46/56    Status Achieved      PT SHORT TERM GOAL #4   Title Pt will safely negotiate curb with cane and supervision x 5 reps with improved foot clearance.    Baseline able to do using a 4" step    Status Achieved             PT Long Term Goals - 09/04/20 1443      PT LONG TERM GOAL #1   Title Pt will demonstrate independence with final HEP    Time 10   due to 10 days out of town   Period Weeks    Status New      PT LONG TERM GOAL #2   Title Pt will demonstrate ability to perform 5-6 single leg heel lifts on RLE to improve push off for gait    Time 10    Period Weeks    Status New      PT LONG TERM GOAL #3   Title Pt will improve BERG balance score to >/= 50/56 to indicate decreased risk for falls    Baseline 46/56    Time 10    Period Weeks    Status New      PT LONG TERM GOAL #4   Title Pt will negotiate 12 stairs with one rail, alternating sequence without catching either foot on stairs, MOD I    Time 10    Period Weeks    Status New      PT LONG TERM GOAL #5   Title Pt will demonstrate ability to stand from low chair (14") without use of UE x 5 reps in </= 12 seconds    Baseline 14 seconds from 14" chair without use of UE    Time 10    Period Weeks    Status New                 Plan - 09/10/20 1554    Clinical Impression Statement Pt is making steady progress towards his goals. His BERG score improved from 41 points up to 46 points last session.  Pt was able to negotiate a 4" step over and back x 4 with supervision from PT meeting goal for curb negotiation. Pt required close supervision and  CGA for safety with exercise. Pt will continue to benefit from skilled PT to address limitations in flexibility, strength and balance.    PT Frequency 2x / week    PT Duration 12 weeks    PT Treatment/Interventions ADLs/Self Care Home Management;Aquatic Therapy;Electrical Stimulation;DME Instruction;Gait training;Stair training;Functional mobility  training;Therapeutic activities;Therapeutic exercise;Balance training;Neuromuscular re-education;Patient/family education    PT Next Visit Plan balance, strength, endurance    PT Home Exercise Plan [email or web address]    Consulted and Agree with Plan of Care Patient           Patient will benefit from skilled therapeutic intervention in order to improve the following deficits and impairments:  Abnormal gait, Decreased balance, Decreased strength, Difficulty walking  Visit Diagnosis: Repeated falls  Muscle weakness (generalized)  Other abnormalities of gait and mobility  Unsteadiness on feet     Problem List Patient Active Problem List   Diagnosis Date Noted  . Dysfunction of Eustachian tube, bilateral 07/21/2020  . Gait disorder 04/20/2016  . Hyperglycemia 04/20/2016  . Abnormal CT scan, chest 06/03/2014  . Erectile dysfunction 06/03/2014  . Spinal stenosis of lumbar region 03/15/2014  . Cough 03/12/2013  . Allergic rhinitis 03/12/2013  . Leg pain, bilateral 12/30/2011  . Bladder neck obstruction 12/30/2011  . Preventative health care 12/25/2011  . Lumbar disc disease 12/25/2011  . S/P removal of thyroid nodule 12/25/2011  . History of MI (myocardial infarction) 12/25/2011  . Right knee meniscal tear 12/25/2011  . Degenerative arthritis of hip 12/25/2011  . HIP PAIN, RIGHT 05/14/2010  . FATIGUE 10/13/2009  . HYPERTENSION, BENIGN 02/25/2009  . CAD, AUTOLOGOUS BYPASS GRAFT 02/25/2009  . CORONARY ARTERY ANEURYSM 02/25/2009  . BACK PAIN 04/15/2008  . Hyperlipidemia 02/13/2008  . CORONARY ARTERY DISEASE 02/13/2008  . DIVERTICULOSIS, COLON 02/13/2008  . CARDIAC DISEASE, HX OF 02/13/2008  . COLONIC POLYPS, HX OF 02/13/2008    Sigurd Sos, PT 09/10/20 4:15 PM  [facility] [address], [place], [place], [address] Phone: [phone]   Fax:  [phone]  Name: Duncan Alejandro MRN: [id number] Date of Birth:  1932-10-22

## 2020-09-11 ENCOUNTER — Encounter: Payer: Medicare Other | Admitting: Internal Medicine

## 2020-09-25 ENCOUNTER — Other Ambulatory Visit: Payer: Self-pay

## 2020-09-25 ENCOUNTER — Ambulatory Visit: Payer: Medicare Other | Attending: Neurological Surgery

## 2020-09-25 DIAGNOSIS — R296 Repeated falls: Secondary | ICD-10-CM | POA: Diagnosis not present

## 2020-09-25 DIAGNOSIS — M6281 Muscle weakness (generalized): Secondary | ICD-10-CM | POA: Diagnosis not present

## 2020-09-25 DIAGNOSIS — R2681 Unsteadiness on feet: Secondary | ICD-10-CM

## 2020-09-25 DIAGNOSIS — R2689 Other abnormalities of gait and mobility: Secondary | ICD-10-CM | POA: Diagnosis not present

## 2020-09-25 NOTE — Therapy (Signed)
[facility] [address], [place], [place], [address] Phone: [phone]   Fax:  [phone]  Physical Therapy Treatment  Patient Details  Name: Larry [name] MRN: [id number] Date of Birth: 31-Mar-1932 Referring Provider (PT): Earleen Newport, MD   Encounter Date: 09/25/2020   PT End of Session - 09/25/20 1228    Visit Number 8    Date for PT Re-Evaluation 11/09/20    Authorization Type Medicare and [employer]; cert period set for 90 days due to pt will be out of town for 10 days from Sept 24th > October 7th.    Progress Note Due on Visit 10    PT Start Time 1146    PT Stop Time 1227    PT Time Calculation (min) 41 min    Activity Tolerance Patient tolerated treatment well;No increased pain    Behavior During Therapy WFL for tasks assessed/performed           Past Medical History:  Diagnosis Date  . Allergic rhinitis, cause unspecified 03/12/2013  . Allergy   . CAD (coronary artery disease)    S/P CABG in 1999   . Cataract   . Decreased exercise tolerance    Exertional fatigue  . Degenerative arthritis of hip   . Diverticulosis of colon   . ED (erectile dysfunction)   . Erectile dysfunction 06/03/2014  . History of colonic polyps   . History of kidney stones   . History of MI (myocardial infarction)   . Hx of adenomatous colonic polyps 2000  . Hyperlipidemia    Low HDL  . Lumbar disc disease    Hx of with recent back pain and buttock pain  . Myocardial infarction ([place]) 1999  . Right knee meniscal tear   . S/P removal of thyroid nodule   . Spinal stenosis of lumbar region 03/15/2014    Past Surgical History:  Procedure Laterality Date  . CARDIAC CATHETERIZATION    . CATARACT EXTRACTION    . COLONOSCOPY    . CORONARY ARTERY BYPASS GRAFT  1999  . CYSTOSCOPY/URETEROSCOPY/HOLMIUM LASER/STENT PLACEMENT Left 12/23/2017   Procedure: CYSTOSCOPY/RETROGRADE/URETEROSCOPY/HOLMIUM LASER/STENT PLACEMENT;   Surgeon: Festus Aloe, MD;  [facility];  Service: Urology;  Laterality: Left;  ONLY NEEDS 60 MIN  . LUMBAR DISC SURGERY    . Nodules on Thyroid    . POLYPECTOMY    . PTCA    . SHOULDER ARTHROSCOPY WITH ROTATOR CUFF REPAIR AND SUBACROMIAL DECOMPRESSION Right 01/30/2015   Procedure: RIGHT ARTHROSCOPY SHOULDER SUBACROMIAL DECOMPRESSION DISTAL CLAVICAL RESECTION RETATOR CUFF REPAIR;  Surgeon: Justice Britain, MD;  Location: [facility];  Service: Orthopedics;  Laterality: Right;  . TONSILLECTOMY    . VASECTOMY      There were no vitals filed for this visit.   Subjective Assessment - 09/25/20 1153    Subjective I did well on my trip. I didn't do as many exercises as I should have.    Patient Stated Goals Would like to improve balance to be able to walk longer distances, strengthen R calf, be able to get out of a chair, turn and bend forward without fear of falling.    Currently in Pain? No/denies                             [facility] Adult PT Treatment/Exercise - 09/25/20 0001      Knee/Hip Exercises: Aerobic   Nustep Level 2x 10 minutes  good endurance with this     Knee/Hip Exercises: Machines for Strengthening   Total Gym Leg Press Seat 7: BLE #90 2x10 reps, Rt & Lt LE 50# 2x10      Knee/Hip Exercises: Standing   Heel Raises Both;2 sets;10 reps    Hip Abduction Stengthening;Both;2 sets;10 reps    Abduction Limitations standing on balance pad    Rocker Board 2 minutes    Rocker Board Limitations good postural alignment    Other Standing Knee Exercises alternating step-taps at edge of treadmill 3x10 bil each    Other Standing Knee Exercises standing on balance pad: weight shifting 3 ways 1x min each with minimal finger tip grip      Knee/Hip Exercises: Seated   Sit to Sand 2 sets;10 reps   holding 5# kettle bell                   PT Short Term Goals - 09/10/20 1538      PT SHORT TERM GOAL #1   Title Pt will be independent with initial HEP before he  travels on Sept 24th    Status Achieved      PT SHORT TERM GOAL #3   Title Pt will improve BERG balance score to >/= 45/56    Baseline 46/56    Status Achieved      PT SHORT TERM GOAL #4   Title Pt will safely negotiate curb with cane and supervision x 5 reps with improved foot clearance.    Baseline able to do using a 4" step    Status Achieved             PT Long Term Goals - 09/04/20 1443      PT LONG TERM GOAL #1   Title Pt will demonstrate independence with final HEP    Time 10   due to 10 days out of town   Period Weeks    Status New      PT LONG TERM GOAL #2   Title Pt will demonstrate ability to perform 5-6 single leg heel lifts on RLE to improve push off for gait    Time 10    Period Weeks    Status New      PT LONG TERM GOAL #3   Title Pt will improve BERG balance score to >/= 50/56 to indicate decreased risk for falls    Baseline 46/56    Time 10    Period Weeks    Status New      PT LONG TERM GOAL #4   Title Pt will negotiate 12 stairs with one rail, alternating sequence without catching either foot on stairs, MOD I    Time 10    Period Weeks    Status New      PT LONG TERM GOAL #5   Title Pt will demonstrate ability to stand from low chair (14") without use of UE x 5 reps in </= 12 seconds    Baseline 14 seconds from 14" chair without use of UE    Time 10    Period Weeks    Status New                 Plan - 09/25/20 1206    Clinical Impression Statement Pt was traveling since last session and reports that he walked for exercise but didn't do much of his homework.  Pt reports that his endurance feels better overall and requires fewer rest breaks for  community activity.  Pt required close supervision and CGA for safety with exercise. Pt will continue to benefit from skilled PT to address limitations in flexibility, strength and balance.    PT Frequency 2x / week    PT Duration 12 weeks    PT Treatment/Interventions ADLs/Self Care Home  Management;Aquatic Therapy;Electrical Stimulation;DME Instruction;Gait training;Stair training;Functional mobility training;Therapeutic activities;Therapeutic exercise;Balance training;Neuromuscular re-education;Patient/family education    PT Next Visit Plan balance, strength, endurance    PT Home Exercise Plan [email or web address]    Recommended Other Services initial cert is signed.    Consulted and Agree with Plan of Care Patient           Patient will benefit from skilled therapeutic intervention in order to improve the following deficits and impairments:  Abnormal gait, Decreased balance, Decreased strength, Difficulty walking  Visit Diagnosis: Repeated falls  Muscle weakness (generalized)  Other abnormalities of gait and mobility  Unsteadiness on feet     Problem List Patient Active Problem List   Diagnosis Date Noted  . Dysfunction of Eustachian tube, bilateral 07/21/2020  . Gait disorder 04/20/2016  . Hyperglycemia 04/20/2016  . Abnormal CT scan, chest 06/03/2014  . Erectile dysfunction 06/03/2014  . Spinal stenosis of lumbar region 03/15/2014  . Cough 03/12/2013  . Allergic rhinitis 03/12/2013  . Leg pain, bilateral 12/30/2011  . Bladder neck obstruction 12/30/2011  . Preventative health care 12/25/2011  . Lumbar disc disease 12/25/2011  . S/P removal of thyroid nodule 12/25/2011  . History of MI (myocardial infarction) 12/25/2011  . Right knee meniscal tear 12/25/2011  . Degenerative arthritis of hip 12/25/2011  . HIP PAIN, RIGHT 05/14/2010  . FATIGUE 10/13/2009  . HYPERTENSION, BENIGN 02/25/2009  . CAD, AUTOLOGOUS BYPASS GRAFT 02/25/2009  . CORONARY ARTERY ANEURYSM 02/25/2009  . BACK PAIN 04/15/2008  . Hyperlipidemia 02/13/2008  . CORONARY ARTERY DISEASE 02/13/2008  . DIVERTICULOSIS, COLON 02/13/2008  . CARDIAC DISEASE, HX OF 02/13/2008  . COLONIC POLYPS, HX OF 02/13/2008     Sigurd Sos, PT 09/25/20 12:29 PM  [facility] [address] [place], [place], [address] Phone: [phone]   Fax:  [phone]  Name: Larry [name] MRN: [id number] Date of Birth: 01-20-1932

## 2020-09-27 DIAGNOSIS — Z23 Encounter for immunization: Secondary | ICD-10-CM | POA: Diagnosis not present

## 2020-09-29 ENCOUNTER — Ambulatory Visit: Payer: Medicare Other

## 2020-09-29 ENCOUNTER — Other Ambulatory Visit: Payer: Self-pay

## 2020-09-29 DIAGNOSIS — M6281 Muscle weakness (generalized): Secondary | ICD-10-CM | POA: Diagnosis not present

## 2020-09-29 DIAGNOSIS — R2689 Other abnormalities of gait and mobility: Secondary | ICD-10-CM | POA: Diagnosis not present

## 2020-09-29 DIAGNOSIS — R2681 Unsteadiness on feet: Secondary | ICD-10-CM

## 2020-09-29 DIAGNOSIS — R296 Repeated falls: Secondary | ICD-10-CM | POA: Diagnosis not present

## 2020-09-29 NOTE — Therapy (Signed)
[facility] [address] [place], [place], [address] Phone: [phone]   Fax:  [phone]  Physical Therapy Treatment  Patient Details  Name: Larry [name] MRN: [id number] Date of Birth: 11-02-1932 Referring Provider (PT): Earleen Newport, MD   Encounter Date: 09/29/2020   PT End of Session - 09/29/20 1524    Visit Number 9    Date for PT Re-Evaluation 11/09/20    Authorization Type Medicare and [employer]; cert period set for 90 days due to pt will be out of town for 10 days from Sept 24th > October 7th.    Progress Note Due on Visit 10    PT Start Time 1447    PT Stop Time 1527    PT Time Calculation (min) 40 min    Activity Tolerance Patient tolerated treatment well;No increased pain    Behavior During Therapy WFL for tasks assessed/performed           Past Medical History:  Diagnosis Date  . Allergic rhinitis, cause unspecified 03/12/2013  . Allergy   . CAD (coronary artery disease)    S/P CABG in 1999   . Cataract   . Decreased exercise tolerance    Exertional fatigue  . Degenerative arthritis of hip   . Diverticulosis of colon   . ED (erectile dysfunction)   . Erectile dysfunction 06/03/2014  . History of colonic polyps   . History of kidney stones   . History of MI (myocardial infarction)   . Hx of adenomatous colonic polyps 2000  . Hyperlipidemia    Low HDL  . Lumbar disc disease    Hx of with recent back pain and buttock pain  . Myocardial infarction ([place]) 1999  . Right knee meniscal tear   . S/P removal of thyroid nodule   . Spinal stenosis of lumbar region 03/15/2014    Past Surgical History:  Procedure Laterality Date  . CARDIAC CATHETERIZATION    . CATARACT EXTRACTION    . COLONOSCOPY    . CORONARY ARTERY BYPASS GRAFT  1999  . CYSTOSCOPY/URETEROSCOPY/HOLMIUM LASER/STENT PLACEMENT Left 12/23/2017   Procedure: CYSTOSCOPY/RETROGRADE/URETEROSCOPY/HOLMIUM LASER/STENT PLACEMENT;   Surgeon: Festus Aloe, MD;  [facility];  Service: Urology;  Laterality: Left;  ONLY NEEDS 60 MIN  . LUMBAR DISC SURGERY    . Nodules on Thyroid    . POLYPECTOMY    . PTCA    . SHOULDER ARTHROSCOPY WITH ROTATOR CUFF REPAIR AND SUBACROMIAL DECOMPRESSION Right 01/30/2015   Procedure: RIGHT ARTHROSCOPY SHOULDER SUBACROMIAL DECOMPRESSION DISTAL CLAVICAL RESECTION RETATOR CUFF REPAIR;  Surgeon: Justice Britain, MD;  Location: [facility];  Service: Orthopedics;  Laterality: Right;  . TONSILLECTOMY    . VASECTOMY      There were no vitals filed for this visit.   Subjective Assessment - 09/29/20 1455    Subjective I did good over the weekend.    Patient Stated Goals Would like to improve balance to be able to walk longer distances, strengthen R calf, be able to get out of a chair, turn and bend forward without fear of falling.    Currently in Pain? No/denies                             [facility] Adult PT Treatment/Exercise - 09/29/20 0001      Knee/Hip Exercises: Aerobic   Nustep Level 3x 10 minutes    good endurance with this  Knee/Hip Exercises: Machines for Strengthening   Total Gym Leg Press Seat 7: BLE 95# 2x10 reps, Rt & Lt LE 50# 2x10      Knee/Hip Exercises: Standing   Hip Abduction Stengthening;Both;2 sets;10 reps    Abduction Limitations standing on balance pad    Rocker Board 2 minutes    Rocker Board Limitations good postural alignment    Other Standing Knee Exercises alternating step-taps at edge of treadmill 3x10 bil each- standing on black pad    Other Standing Knee Exercises standing on balance pad: weight shifting 3 ways 1x min each with minimal finger tip grip.  Static standing wihtout UE support, feet together and body turns       Knee/Hip Exercises: Seated   Sit to Sand 2 sets;10 reps   holding 5# kettle bell                   PT Short Term Goals - 09/10/20 1538      PT SHORT TERM GOAL #1   Title Pt will be independent with initial  HEP before he travels on Sept 24th    Status Achieved      PT SHORT TERM GOAL #3   Title Pt will improve BERG balance score to >/= 45/56    Baseline 46/56    Status Achieved      PT SHORT TERM GOAL #4   Title Pt will safely negotiate curb with cane and supervision x 5 reps with improved foot clearance.    Baseline able to do using a 4" step    Status Achieved             PT Long Term Goals - 09/04/20 1443      PT LONG TERM GOAL #1   Title Pt will demonstrate independence with final HEP    Time 10   due to 10 days out of town   Period Weeks    Status New      PT LONG TERM GOAL #2   Title Pt will demonstrate ability to perform 5-6 single leg heel lifts on RLE to improve push off for gait    Time 10    Period Weeks    Status New      PT LONG TERM GOAL #3   Title Pt will improve BERG balance score to >/= 50/56 to indicate decreased risk for falls    Baseline 46/56    Time 10    Period Weeks    Status New      PT LONG TERM GOAL #4   Title Pt will negotiate 12 stairs with one rail, alternating sequence without catching either foot on stairs, MOD I    Time 10    Period Weeks    Status New      PT LONG TERM GOAL #5   Title Pt will demonstrate ability to stand from low chair (14") without use of UE x 5 reps in </= 12 seconds    Baseline 14 seconds from 14" chair without use of UE    Time 10    Period Weeks    Status New                 Plan - 09/29/20 1500    Clinical Impression Statement Pt has not returned to consistency with exercises yet after getting back from his trip.   Pt reports that his endurance feels better overall and requires fewer rest breaks for community activity.  Pt  tolerated advanced resistance on NuStep and increased weights on leg press.  Pt demonstrates shortened step length gait and bil flexed knees on level surfaces when not using cane.  Pt required close supervision and CGA for safety with exercise. Pt will continue to benefit from  skilled PT to address limitations in flexibility, strength and balance.    Comorbidities allergic rhinitis, CAD s/p CABG in 1999, OA of hip, h/o MI, adenomatous colonic polyps, HLD, lumbar disc disease, R knee meniscus tear, removal of thyroid nodule, lumbar spinal stenosis, shoulder arthroscopy wtih rotator cuff repair and subacromial decompression    PT Frequency 2x / week    PT Duration 8 weeks    PT Treatment/Interventions ADLs/Self Care Home Management;Aquatic Therapy;Electrical Stimulation;DME Instruction;Gait training;Stair training;Functional mobility training;Therapeutic activities;Therapeutic exercise;Balance training;Neuromuscular re-education;Patient/family education    PT Next Visit Plan balance, strength, endurance    PT Home Exercise Plan [id number]    Consulted and Agree with Plan of Care Patient           Patient will benefit from skilled therapeutic intervention in order to improve the following deficits and impairments:  Abnormal gait, Decreased balance, Decreased strength, Difficulty walking  Visit Diagnosis: Repeated falls  Muscle weakness (generalized)  Other abnormalities of gait and mobility  Unsteadiness on feet     Problem List Patient Active Problem List   Diagnosis Date Noted  . Dysfunction of Eustachian tube, bilateral 07/21/2020  . Gait disorder 04/20/2016  . Hyperglycemia 04/20/2016  . Abnormal CT scan, chest 06/03/2014  . Erectile dysfunction 06/03/2014  . Spinal stenosis of lumbar region 03/15/2014  . Cough 03/12/2013  . Allergic rhinitis 03/12/2013  . Leg pain, bilateral 12/30/2011  . Bladder neck obstruction 12/30/2011  . Preventative health care 12/25/2011  . Lumbar disc disease 12/25/2011  . S/P removal of thyroid nodule 12/25/2011  . History of MI (myocardial infarction) 12/25/2011  . Right knee meniscal tear 12/25/2011  . Degenerative arthritis of hip 12/25/2011  . HIP PAIN, RIGHT 05/14/2010  . FATIGUE 10/13/2009  . HYPERTENSION,  BENIGN 02/25/2009  . CAD, AUTOLOGOUS BYPASS GRAFT 02/25/2009  . CORONARY ARTERY ANEURYSM 02/25/2009  . BACK PAIN 04/15/2008  . Hyperlipidemia 02/13/2008  . CORONARY ARTERY DISEASE 02/13/2008  . DIVERTICULOSIS, COLON 02/13/2008  . CARDIAC DISEASE, HX OF 02/13/2008  . COLONIC POLYPS, HX OF 02/13/2008     Sigurd Sos, PT 09/29/20 3:27 PM  [facility] [address] [place], [place], [address] Phone: [phone]   Fax:  [phone]  Name: Kadan Millstein MRN: [id number] Date of Birth: March 09, 1932

## 2020-09-30 ENCOUNTER — Encounter: Payer: Self-pay | Admitting: Internal Medicine

## 2020-09-30 ENCOUNTER — Other Ambulatory Visit: Payer: Self-pay

## 2020-09-30 ENCOUNTER — Ambulatory Visit (INDEPENDENT_AMBULATORY_CARE_PROVIDER_SITE_OTHER): Payer: Medicare Other | Admitting: Internal Medicine

## 2020-09-30 VITALS — BP 120/80 | HR 86 | Temp 98.7°F | Ht 67.0 in | Wt 177.0 lb

## 2020-09-30 DIAGNOSIS — E7800 Pure hypercholesterolemia, unspecified: Secondary | ICD-10-CM

## 2020-09-30 DIAGNOSIS — R739 Hyperglycemia, unspecified: Secondary | ICD-10-CM | POA: Diagnosis not present

## 2020-09-30 DIAGNOSIS — I10 Essential (primary) hypertension: Secondary | ICD-10-CM

## 2020-09-30 DIAGNOSIS — I25810 Atherosclerosis of coronary artery bypass graft(s) without angina pectoris: Secondary | ICD-10-CM

## 2020-09-30 DIAGNOSIS — Z23 Encounter for immunization: Secondary | ICD-10-CM

## 2020-09-30 LAB — BASIC METABOLIC PANEL
BUN: 23 mg/dL (ref 6–23)
CO2: 29 mEq/L (ref 19–32)
Calcium: 9.1 mg/dL (ref 8.4–10.5)
Chloride: 102 mEq/L (ref 96–112)
Creatinine, Ser: 1.15 mg/dL (ref 0.40–1.50)
GFR: 57.36 mL/min — ABNORMAL LOW (ref >60.00)
Glucose, Bld: 99 mg/dL (ref 70–99)
Potassium: 3.9 mEq/L (ref 3.5–5.1)
Sodium: 138 mEq/L (ref 135–145)

## 2020-09-30 LAB — LIPID PANEL
Cholesterol: 112 mg/dL (ref 0–200)
HDL: 31.7 mg/dL — ABNORMAL LOW (ref >39.00)
NonHDL: 79.81
Total CHOL/HDL Ratio: 4
Triglycerides: 354 mg/dL — ABNORMAL HIGH (ref 0.0–149.0)
VLDL: 70.8 mg/dL — ABNORMAL HIGH (ref 0.0–40.0)

## 2020-09-30 LAB — CBC WITH DIFFERENTIAL/PLATELET
Basophils Absolute: 0.1 10*3/uL (ref 0.0–0.1)
Basophils Relative: 0.7 % (ref 0.0–3.0)
Eosinophils Absolute: 0.2 10*3/uL (ref 0.0–0.7)
Eosinophils Relative: 3.1 % (ref 0.0–5.0)
HCT: 48.1 % (ref 39.0–52.0)
Hemoglobin: 16.4 g/dL (ref 13.0–17.0)
Lymphocytes Relative: 29.2 % (ref 12.0–46.0)
Lymphs Abs: 2.2 10*3/uL (ref 0.7–4.0)
MCHC: 34.1 g/dL (ref 30.0–36.0)
MCV: 92.3 fl (ref 78.0–100.0)
Monocytes Absolute: 0.8 10*3/uL (ref 0.1–1.0)
Monocytes Relative: 11.2 % (ref 3.0–12.0)
Neutro Abs: 4.2 10*3/uL (ref 1.4–7.7)
Neutrophils Relative %: 55.8 % (ref 43.0–77.0)
Platelets: 251 10*3/uL (ref 150.0–400.0)
RBC: 5.22 Mil/uL (ref 4.22–5.81)
RDW: 13.6 % (ref 11.5–15.5)
WBC: 7.5 10*3/uL (ref 4.0–10.5)

## 2020-09-30 LAB — TSH: TSH: 1.75 u[IU]/mL (ref 0.35–4.50)

## 2020-09-30 LAB — LDL CHOLESTEROL, DIRECT: Direct LDL: 52 mg/dL

## 2020-09-30 LAB — HEPATIC FUNCTION PANEL
ALT: 15 U/L (ref 0–53)
AST: 18 U/L (ref 0–37)
Albumin: 4.3 g/dL (ref 3.5–5.2)
Alkaline Phosphatase: 40 U/L (ref 39–117)
Bilirubin, Direct: 0.2 mg/dL (ref 0.0–0.3)
Total Bilirubin: 0.8 mg/dL (ref 0.2–1.2)
Total Protein: 7.6 g/dL (ref 6.0–8.3)

## 2020-09-30 LAB — HEMOGLOBIN A1C: Hgb A1c MFr Bld: 5.8 % (ref 4.6–6.5)

## 2020-09-30 NOTE — Progress Notes (Signed)
Subjective:    Patient ID: Larry [name], male    DOB: [birth year], 86 y.o.   MRN: [id number]  HPI  Here to f/u; overall doing ok,  Pt denies chest pain, increasing sob or doe, wheezing, orthopnea, PND, increased LE swelling, palpitations, dizziness or syncope.  Pt denies new neurological symptoms such as new headache, or facial or extremity weakness or numbness.  Pt denies polydipsia, polyuria, or low sugar episode.  Pt states overall good compliance with meds, mostly trying to follow appropriate diet, with wt overall stable,  but little exercise however. No new complaints Past Medical History:  Diagnosis Date  . Allergic rhinitis, cause unspecified 03/12/2013  . Allergy   . CAD (coronary artery disease)    S/P CABG in 1999   . Cataract   . Decreased exercise tolerance    Exertional fatigue  . Degenerative arthritis of hip   . Diverticulosis of colon   . ED (erectile dysfunction)   . Erectile dysfunction 06/03/2014  . History of colonic polyps   . History of kidney stones   . History of MI (myocardial infarction)   . Hx of adenomatous colonic polyps 2000  . Hyperlipidemia    Low HDL  . Lumbar disc disease    Hx of with recent back pain and buttock pain  . Myocardial infarction ([place]) 1999  . Right knee meniscal tear   . S/P removal of thyroid nodule   . Spinal stenosis of lumbar region 03/15/2014   Past Surgical History:  Procedure Laterality Date  . CARDIAC CATHETERIZATION    . CATARACT EXTRACTION    . COLONOSCOPY    . CORONARY ARTERY BYPASS GRAFT  1999  . CYSTOSCOPY/URETEROSCOPY/HOLMIUM LASER/STENT PLACEMENT Left 12/23/2017   Procedure: CYSTOSCOPY/RETROGRADE/URETEROSCOPY/HOLMIUM LASER/STENT PLACEMENT;  Surgeon: Festus Aloe, MD;  Location: [facility];  Service: Urology;  Laterality: Left;  ONLY NEEDS 60 MIN  . LUMBAR DISC SURGERY    . Nodules on Thyroid    . POLYPECTOMY    . PTCA    . SHOULDER ARTHROSCOPY WITH ROTATOR CUFF REPAIR AND SUBACROMIAL DECOMPRESSION Right 01/30/2015    Procedure: RIGHT ARTHROSCOPY SHOULDER SUBACROMIAL DECOMPRESSION DISTAL CLAVICAL RESECTION RETATOR CUFF REPAIR;  Surgeon: Justice Britain, MD;  Location: [facility];  Service: Orthopedics;  Laterality: Right;  . TONSILLECTOMY    . VASECTOMY      reports that he has never smoked. He has never used smokeless tobacco. He reports current alcohol use. He reports that he does not use drugs. family history includes Heart disease in his brother and mother; Prostate cancer in some other family members. Allergies  Allergen Reactions  . Beta Adrenergic Blockers Other (See Comments)    Dropped blood pressure too low  . Simvastatin Other (See Comments)    myalgia   Current Outpatient Medications on File Prior to Visit  Medication Sig Dispense Refill  . acetaminophen (TYLENOL) 650 MG CR tablet Take 650 mg by mouth every 8 (eight) hours as needed for pain.     . calcium-vitamin D (OSCAL WITH D) 500-200 MG-UNIT per tablet Take 1 tablet by mouth 2 (two) times daily.     . cetirizine (ZYRTEC) 10 MG tablet Take 10 mg by mouth daily.    . clopidogrel (PLAVIX) 75 MG tablet TAKE 1 TABLET BY MOUTH EVERY DAY 90 tablet 2  . fish oil-omega-3 fatty acids 1000 MG capsule Take 1 g by mouth 2 (two) times daily.     . folic acid (FOLVITE) 644 MCG tablet Take 400 mcg by  mouth daily.      . Multiple Vitamin (MULTIVITAMIN WITH MINERALS) TABS tablet Take 1 tablet by mouth daily. Centrum Silver    . rosuvastatin (CRESTOR) 10 MG tablet Take 1 tablet (10 mg total) by mouth in the morning and at bedtime. Take 1 tablet daily alternating with half tablet daily. 180 tablet 3  . tamsulosin (FLOMAX) 0.4 MG CAPS capsule TAKE 1 CAPSULE BY MOUTH EVERY DAY 90 capsule 1   No current facility-administered medications on file prior to visit.   Review of Systems All otherwise neg per pt    Objective:   Physical Exam BP 120/80 (BP Location: Left Arm, Patient Position: Sitting, Cuff Size: Large)   Pulse 86   Temp 98.7 F (37.1 C) (Oral)    Ht 5\' 7"  (1.702 m)   Wt 177 lb (80.3 kg)   SpO2 96%   BMI 27.72 kg/m   VS noted,  Constitutional: Pt appears in NAD HENT: Head: NCAT.  Right Ear: External ear normal.  Left Ear: External ear normal.  Eyes: . Pupils are equal, round, and reactive to light. Conjunctivae and EOM are normal Nose: without d/c or deformity Neck: Neck supple. Gross normal ROM Cardiovascular: Normal rate and regular rhythm.   Pulmonary/Chest: Effort normal and breath sounds without rales or wheezing.  Abd:  Soft, NT, ND, + BS, no organomegaly Neurological: Pt is alert. At baseline orientation, motor grossly intact Skin: Skin is warm. No rashes, other new lesions, no LE edema Psychiatric: Pt behavior is normal without agitation  All otherwise neg per pt  Lab Results  Component Value Date   WBC 7.5 09/30/2020   HGB 16.4 09/30/2020   HCT 48.1 09/30/2020   PLT 251.0 09/30/2020   GLUCOSE 99 09/30/2020   CHOL 112 09/30/2020   TRIG 354.0 (H) 09/30/2020   HDL 31.70 (L) 09/30/2020   LDLDIRECT 52.0 09/30/2020   LDLCALC 41 09/07/2019   ALT 15 09/30/2020   AST 18 09/30/2020   NA 138 09/30/2020   K 3.9 09/30/2020   CL 102 09/30/2020   CREATININE 1.15 09/30/2020   BUN 23 09/30/2020   CO2 29 09/30/2020   TSH 1.75 09/30/2020   PSA 1.40 03/20/2015   INR 0.99 12/19/2017   HGBA1C 5.8 09/30/2020         Assessment & Plan:

## 2020-09-30 NOTE — Patient Instructions (Signed)

## 2020-10-01 ENCOUNTER — Ambulatory Visit: Payer: Medicare Other

## 2020-10-01 DIAGNOSIS — R2689 Other abnormalities of gait and mobility: Secondary | ICD-10-CM | POA: Diagnosis not present

## 2020-10-01 DIAGNOSIS — R296 Repeated falls: Secondary | ICD-10-CM | POA: Diagnosis not present

## 2020-10-01 DIAGNOSIS — R2681 Unsteadiness on feet: Secondary | ICD-10-CM

## 2020-10-01 DIAGNOSIS — M6281 Muscle weakness (generalized): Secondary | ICD-10-CM

## 2020-10-01 NOTE — Therapy (Signed)
[facility] [address], [place], [place], [address] Phone: [phone]   Fax:  [phone]  Physical Therapy Treatment  Patient Details  Name: Larry [name] MRN: [id number] Date of Birth: 1932/06/05 Referring Provider (PT): Earleen Newport, MD   Encounter Date: 10/01/2020 Progress Note Reporting Period 08/11/20 to 10/01/20  See note below for Objective Data and Assessment of Progress/Goals.       PT End of Session - 10/01/20 1438    Visit Number 10    Date for PT Re-Evaluation 11/09/20    Authorization Type Medicare and [employer]; cert period set for 90 days due to pt will be out of town for 10 days from Sept 24th > October 7th.    Progress Note Due on Visit 20    PT Start Time 1402    PT Stop Time 1447    PT Time Calculation (min) 45 min    Activity Tolerance Patient tolerated treatment well;No increased pain    Behavior During Therapy WFL for tasks assessed/performed           Past Medical History:  Diagnosis Date  . Allergic rhinitis, cause unspecified 03/12/2013  . Allergy   . CAD (coronary artery disease)    S/P CABG in 1999   . Cataract   . Decreased exercise tolerance    Exertional fatigue  . Degenerative arthritis of hip   . Diverticulosis of colon   . ED (erectile dysfunction)   . Erectile dysfunction 06/03/2014  . History of colonic polyps   . History of kidney stones   . History of MI (myocardial infarction)   . Hx of adenomatous colonic polyps 2000  . Hyperlipidemia    Low HDL  . Lumbar disc disease    Hx of with recent back pain and buttock pain  . Myocardial infarction ([place]) 1999  . Right knee meniscal tear   . S/P removal of thyroid nodule   . Spinal stenosis of lumbar region 03/15/2014    Past Surgical History:  Procedure Laterality Date  . CARDIAC CATHETERIZATION    . CATARACT EXTRACTION    . COLONOSCOPY    . CORONARY ARTERY BYPASS GRAFT  1999  .  CYSTOSCOPY/URETEROSCOPY/HOLMIUM LASER/STENT PLACEMENT Left 12/23/2017   Procedure: CYSTOSCOPY/RETROGRADE/URETEROSCOPY/HOLMIUM LASER/STENT PLACEMENT;  Surgeon: Festus Aloe, MD;  Location[facility];  Service: Urology;  Laterality: Left;  ONLY NEEDS 60 MIN  . LUMBAR DISC SURGERY    . Nodules on Thyroid    . POLYPECTOMY    . PTCA    . SHOULDER ARTHROSCOPY WITH ROTATOR CUFF REPAIR AND SUBACROMIAL DECOMPRESSION Right 01/30/2015   Procedure: RIGHT ARTHROSCOPY SHOULDER SUBACROMIAL DECOMPRESSION DISTAL CLAVICAL RESECTION RETATOR CUFF REPAIR;  Surgeon: Justice Britain, MD;  Location: [facility];  Service: Orthopedics;  Laterality: Right;  . TONSILLECTOMY    . VASECTOMY      There were no vitals filed for this visit.   Subjective Assessment - 10/01/20 1441    Subjective I am doing better with doing my exercises.    Patient Stated Goals Would like to improve balance to be able to walk longer distances, strengthen R calf, be able to get out of a chair, turn and bend forward without fear of falling.    Currently in Pain? No/denies              [facility] PT Assessment - 10/01/20 0001      Assessment   Medical Diagnosis Balance impairments, fall    Referring  Provider (PT) Earleen Newport, MD    Onset Date/Surgical Date 05/30/20      Merrilee Jansky Balance Test   Sit to Stand Able to stand without using hands and stabilize independently    Standing Unsupported Able to stand safely 2 minutes    Sitting with Back Unsupported but Feet Supported on Floor or Stool Able to sit safely and securely 2 minutes    Stand to Sit Sits safely with minimal use of hands    Transfers Able to transfer safely, minor use of hands    Standing Unsupported with Eyes Closed Able to stand 10 seconds with supervision    Standing Unsupported with Feet Together Able to place feet together independently and stand 1 minute safely    From Standing, Reach Forward with Outstretched Arm Can reach forward >12 cm safely (5")    From Standing  Position, Pick up Object from [place] to pick up shoe safely and easily    From Standing Position, Turn to Look Behind Over each Shoulder Looks behind from both sides and weight shifts well    Turn 360 Degrees Able to turn 360 degrees safely one side only in 4 seconds or less    Standing Unsupported, Alternately Place Feet on Step/Stool Able to complete 4 steps without aid or supervision    Standing Unsupported, One Foot in Front Able to plae foot ahead of the other independently and hold 30 seconds    Standing on One Leg Able to lift leg independently and hold equal to or more than 3 seconds    Total Score 48    Berg comment: high falls risk                         [facility] Adult PT Treatment/Exercise - 10/01/20 0001      Knee/Hip Exercises: Aerobic   Nustep Level 3x 10 minutes    good endurance with this     Knee/Hip Exercises: Machines for Strengthening   Total Gym Leg Press Seat 7: BLE 95# 2x10 reps, Rt & Lt LE 50# 2x10      Knee/Hip Exercises: Standing   Rocker Board 2 minutes    Rocker Board Limitations good postural alignment    Gait Training tandem stance 2x 30 seconds each with finger tip grip    Other Standing Knee Exercises alternating step-taps at edge of treadmill 3x10 bil each- standing on black pad    Other Standing Knee Exercises standing on balance pad: weight shifting 3 ways 1x min each with minimal finger tip grip.  Static standing wihtout UE support, feet together and body turns       Knee/Hip Exercises: Seated   Sit to Sand 1 set;10 reps                    PT Short Term Goals - 09/10/20 1538      PT SHORT TERM GOAL #1   Title Pt will be independent with initial HEP before he travels on Sept 24th    Status Achieved      PT SHORT TERM GOAL #3   Title Pt will improve BERG balance score to >/= 45/56    Baseline 46/56    Status Achieved      PT SHORT TERM GOAL #4   Title Pt will safely negotiate curb with cane and supervision x 5 reps  with improved foot clearance.    Baseline able to do using a 4"  step    Status Achieved             PT Long Term Goals - 10/01/20 1400      PT LONG TERM GOAL #2   Title Pt will demonstrate ability to perform 5-6 single leg heel lifts on RLE to improve push off for gait    Baseline --    Time 10    Period Weeks    Status On-going      PT LONG TERM GOAL #3   Title Pt will improve BERG balance score to >/= 50/56 to indicate decreased risk for falls    Baseline 48/56- high falls risk    Time 10    Status On-going      PT LONG TERM GOAL #4   Title Pt will negotiate 12 stairs with one rail, alternating sequence without catching either foot on stairs, MOD I    Baseline not doing this  with step-over-step.  No issues with catching foot when doing step-to    Time 10    Period Weeks    Status On-going      PT LONG TERM GOAL #5   Title Pt will demonstrate ability to stand from low chair (14") without use of UE x 5 reps in </= 12 seconds    Baseline 10 seconds from chair in clinic- no 14" chairs here (18" chair)    Time 10    Period Weeks    Status On-going                 Plan - 10/01/20 1425    Clinical Impression Statement Pt is making steady progress regarding balance and endurance.  Merrilee Jansky is improved by 2 points and pt performed 5x sit to stand from 18" chair in 10 seconds without UE support.  Pt remains at a high falls risk although testing indicates that this has risk has reduced.  Pt reports that his endurance feels better overall and requires fewer rest breaks for community activity.    Pt demonstrates shortened step length gait and bil flexed knees on level surfaces when not using cane.  Pt required close supervision and CGA for safety with exercise. Pt will continue to benefit from skilled PT to address limitations in flexibility, strength, endurance and safety for home and community tasks.    PT Frequency 2x / week    PT Duration 8 weeks    PT Treatment/Interventions  ADLs/Self Care Home Management;Aquatic Therapy;Electrical Stimulation;DME Instruction;Gait training;Stair training;Functional mobility training;Therapeutic activities;Therapeutic exercise;Balance training;Neuromuscular re-education;Patient/family education    PT Next Visit Plan balance, strength, endurance    PT Home Exercise Plan [id number]    Consulted and Agree with Plan of Care Patient           Patient will benefit from skilled therapeutic intervention in order to improve the following deficits and impairments:  Abnormal gait, Decreased balance, Decreased strength, Difficulty walking  Visit Diagnosis: Repeated falls  Muscle weakness (generalized)  Other abnormalities of gait and mobility  Unsteadiness on feet     Problem List Patient Active Problem List   Diagnosis Date Noted  . Dysfunction of Eustachian tube, bilateral 07/21/2020  . Gait disorder 04/20/2016  . Hyperglycemia 04/20/2016  . Abnormal CT scan, chest 06/03/2014  . Erectile dysfunction 06/03/2014  . Spinal stenosis of lumbar region 03/15/2014  . Cough 03/12/2013  . Allergic rhinitis 03/12/2013  . Leg pain, bilateral 12/30/2011  . Bladder neck obstruction 12/30/2011  . Preventative health care 12/25/2011  .  Lumbar disc disease 12/25/2011  . S/P removal of thyroid nodule 12/25/2011  . History of MI (myocardial infarction) 12/25/2011  . Right knee meniscal tear 12/25/2011  . Degenerative arthritis of hip 12/25/2011  . HIP PAIN, RIGHT 05/14/2010  . FATIGUE 10/13/2009  . HYPERTENSION, BENIGN 02/25/2009  . CAD, AUTOLOGOUS BYPASS GRAFT 02/25/2009  . CORONARY ARTERY ANEURYSM 02/25/2009  . BACK PAIN 04/15/2008  . Hyperlipidemia 02/13/2008  . CORONARY ARTERY DISEASE 02/13/2008  . DIVERTICULOSIS, COLON 02/13/2008  . CARDIAC DISEASE, HX OF 02/13/2008  . COLONIC POLYPS, HX OF 02/13/2008     Sigurd Sos, PT 10/01/20 2:42 PM  [facility] [address] [place], [place], [address] Phone: [phone]   Fax:  [phone]  Name: Dekari Bures MRN: [id number] Date of Birth: Nov 24, 1932

## 2020-10-02 DIAGNOSIS — Z961 Presence of intraocular lens: Secondary | ICD-10-CM | POA: Diagnosis not present

## 2020-10-07 ENCOUNTER — Other Ambulatory Visit: Payer: Self-pay

## 2020-10-07 ENCOUNTER — Ambulatory Visit: Payer: Medicare Other | Admitting: Physical Therapy

## 2020-10-07 DIAGNOSIS — R2689 Other abnormalities of gait and mobility: Secondary | ICD-10-CM | POA: Diagnosis not present

## 2020-10-07 DIAGNOSIS — R2681 Unsteadiness on feet: Secondary | ICD-10-CM | POA: Diagnosis not present

## 2020-10-07 DIAGNOSIS — R296 Repeated falls: Secondary | ICD-10-CM

## 2020-10-07 DIAGNOSIS — M6281 Muscle weakness (generalized): Secondary | ICD-10-CM

## 2020-10-07 NOTE — Therapy (Signed)
[facility] [address] [place], [place], [address] Phone: [phone]   Fax:  [phone]  Physical Therapy Treatment  Patient Details  Name: Larry [name] MRN: [id number] Date of Birth: [birth year] Referring Provider (PT): Earleen Newport, MD   Encounter Date: 10/07/2020   PT End of Session - 10/07/20 1408    Visit Number 11    Number of Visits 17    Date for PT Re-Evaluation 11/09/20    Authorization Type Medicare and [employer]; cert period set for 90 days due to pt will be out of town for 10 days from Sept 24th > October 7th.    Progress Note Due on Visit 20    PT Start Time 1400    PT Stop Time 1440    PT Time Calculation (min) 40 min    Activity Tolerance Patient tolerated treatment well;No increased pain           Past Medical History:  Diagnosis Date  . Allergic rhinitis, cause unspecified 03/12/2013  . Allergy   . CAD (coronary artery disease)    S/P CABG in 1999   . Cataract   . Decreased exercise tolerance    Exertional fatigue  . Degenerative arthritis of hip   . Diverticulosis of colon   . ED (erectile dysfunction)   . Erectile dysfunction 06/03/2014  . History of colonic polyps   . History of kidney stones   . History of MI (myocardial infarction)   . Hx of adenomatous colonic polyps 2000  . Hyperlipidemia    Low HDL  . Lumbar disc disease    Hx of with recent back pain and buttock pain  . Myocardial infarction ([place]) 1999  . Right knee meniscal tear   . S/P removal of thyroid nodule   . Spinal stenosis of lumbar region 03/15/2014    Past Surgical History:  Procedure Laterality Date  . CARDIAC CATHETERIZATION    . CATARACT EXTRACTION    . COLONOSCOPY    . CORONARY ARTERY BYPASS GRAFT  1999  . CYSTOSCOPY/URETEROSCOPY/HOLMIUM LASER/STENT PLACEMENT Left 12/23/2017   Procedure: CYSTOSCOPY/RETROGRADE/URETEROSCOPY/HOLMIUM LASER/STENT PLACEMENT;  Surgeon: Festus Aloe, MD;   Location[facility];  Service: Urology;  Laterality: Left;  ONLY NEEDS 60 MIN  . LUMBAR DISC SURGERY    . Nodules on Thyroid    . POLYPECTOMY    . PTCA    . SHOULDER ARTHROSCOPY WITH ROTATOR CUFF REPAIR AND SUBACROMIAL DECOMPRESSION Right 01/30/2015   Procedure: RIGHT ARTHROSCOPY SHOULDER SUBACROMIAL DECOMPRESSION DISTAL CLAVICAL RESECTION RETATOR CUFF REPAIR;  Surgeon: Justice Britain, MD;  Location: [facility];  Service: Orthopedics;  Laterality: Right;  . TONSILLECTOMY    . VASECTOMY      There were no vitals filed for this visit.   Subjective Assessment - 10/07/20 1401    Subjective My endurance is better.  I go for walks at the park now.  Walk for 10 minutes, then rest on a bench and then walk some more.  I do that 3-4x/week.  My balance is still shaky.  I'm careful in the house b/c I don't use my cane in the house. I exercise on the step holding a 5# weight and sit to stand with a 5# weight.  I get on [employer] for 1/2 hour.    Pertinent History In 1964 had disc removed with residual right LE weakness    Diagnostic tests Xray demonstrated lumbar scoliosis and spondylolisthesis stable from a year ago.  Patient Stated Goals Would like to improve balance to be able to walk longer distances, strengthen R calf, be able to get out of a chair, turn and bend forward without fear of falling.    Currently in Pain? No/denies                             [facility] Adult PT Treatment/Exercise - 10/07/20 0001      Neuro Re-ed    Neuro Re-ed Details  red ball toss with various standing positions bounce catch; standing on black foam with head turns and UE movements;  stand on foam with red band oscillations 15x       Knee/Hip Exercises: Stretches   Other Knee/Hip Stretches doorway stretch with UE reaches 2x 5 right/left       Knee/Hip Exercises: Aerobic   Nustep L3 10 min while discussing status/progress       Knee/Hip Exercises: Machines for Strengthening   Total Gym Leg Press Seat 7: BLE  95# 15 reps, Rt & Lt LE 50# 15x each      Knee/Hip Exercises: Standing   Wall Squat Limitations wall push up 10x    Lunge Walking - Round Trips UE and LE Wall climbs 10x     Other Standing Knee Exercises stepping over forward and laterally over obstacle 10x each     Other Standing Knee Exercises box step 5x each direction                     PT Short Term Goals - 09/10/20 1538      PT SHORT TERM GOAL #1   Title Pt will be independent with initial HEP before he travels on Sept 24th    Status Achieved      PT SHORT TERM GOAL #3   Title Pt will improve BERG balance score to >/= 45/56    Baseline 46/56    Status Achieved      PT SHORT TERM GOAL #4   Title Pt will safely negotiate curb with cane and supervision x 5 reps with improved foot clearance.    Baseline able to do using a 4" step    Status Achieved             PT Long Term Goals - 10/01/20 1400      PT LONG TERM GOAL #2   Title Pt will demonstrate ability to perform 5-6 single leg heel lifts on RLE to improve push off for gait    Baseline --    Time 10    Period Weeks    Status On-going      PT LONG TERM GOAL #3   Title Pt will improve BERG balance score to >/= 50/56 to indicate decreased risk for falls    Baseline 48/56- high falls risk    Time 10    Status On-going      PT LONG TERM GOAL #4   Title Pt will negotiate 12 stairs with one rail, alternating sequence without catching either foot on stairs, MOD I    Baseline not doing this  with step-over-step.  No issues with catching foot when doing step-to    Time 10    Period Weeks    Status On-going      PT LONG TERM GOAL #5   Title Pt will demonstrate ability to stand from low chair (14") without use of UE x 5 reps in </= 12  seconds    Baseline 10 seconds from chair in clinic- no 14" chairs here (18" chair)    Time 10    Period Weeks    Status On-going                 Plan - 10/07/20 1447    Clinical Impression Statement The patient  is able to participate in moderate intensity balance challenges today with only min assist/CGA from therapist for safety.  He tends to bend his knees as a compensatory strategy initially with a new exercise/balance activity.  He requires a few short rest breaks between exercises but rates his perceived exertion level low at a 7/20.  Therapist monitoring response with all treatment interventions.  He demonstrates good compliance with HEP and regular aerobic conditioning at home and walking at the park.    Comorbidities allergic rhinitis, CAD s/p CABG in 1999, OA of hip, h/o MI, adenomatous colonic polyps, HLD, lumbar disc disease, R knee meniscus tear, removal of thyroid nodule, lumbar spinal stenosis, shoulder arthroscopy wtih rotator cuff repair and subacromial decompression    Rehab Potential Good    PT Frequency 2x / week    PT Duration 8 weeks    PT Treatment/Interventions ADLs/Self Care Home Management;Aquatic Therapy;Electrical Stimulation;DME Instruction;Gait training;Stair training;Functional mobility training;Therapeutic activities;Therapeutic exercise;Balance training;Neuromuscular re-education;Patient/family education    PT Next Visit Plan balance, strength, endurance    PT Home Exercise Plan [email or web address]           Patient will benefit from skilled therapeutic intervention in order to improve the following deficits and impairments:  Abnormal gait, Decreased balance, Decreased strength, Difficulty walking  Visit Diagnosis: Repeated falls  Muscle weakness (generalized)  Other abnormalities of gait and mobility  Unsteadiness on feet     Problem List Patient Active Problem List   Diagnosis Date Noted  . Dysfunction of Eustachian tube, bilateral 07/21/2020  . Gait disorder 04/20/2016  . Hyperglycemia 04/20/2016  . Abnormal CT scan, chest 06/03/2014  . Erectile dysfunction 06/03/2014  . Spinal stenosis of lumbar region 03/15/2014  . Cough 03/12/2013  . Allergic rhinitis  03/12/2013  . Leg pain, bilateral 12/30/2011  . Bladder neck obstruction 12/30/2011  . Preventative health care 12/25/2011  . Lumbar disc disease 12/25/2011  . S/P removal of thyroid nodule 12/25/2011  . History of MI (myocardial infarction) 12/25/2011  . Right knee meniscal tear 12/25/2011  . Degenerative arthritis of hip 12/25/2011  . HIP PAIN, RIGHT 05/14/2010  . FATIGUE 10/13/2009  . HYPERTENSION, BENIGN 02/25/2009  . CAD, AUTOLOGOUS BYPASS GRAFT 02/25/2009  . CORONARY ARTERY ANEURYSM 02/25/2009  . BACK PAIN 04/15/2008  . Hyperlipidemia 02/13/2008  . CORONARY ARTERY DISEASE 02/13/2008  . DIVERTICULOSIS, COLON 02/13/2008  . CARDIAC DISEASE, HX OF 02/13/2008  . COLONIC POLYPS, HX OF 02/13/2008   Ruben Im, PT 10/07/20 4:14 PM Phone: [phone] Fax: [phone] Alvera Singh 10/07/2020, 4:14 PM  [facility] [address] [place], [place], [address] Phone: [phone]   Fax:  [phone]  Name: Larry [name] MRN: [id number] Date of Birth: 06-22-1932

## 2020-10-09 ENCOUNTER — Ambulatory Visit: Payer: Medicare Other

## 2020-10-09 ENCOUNTER — Other Ambulatory Visit: Payer: Self-pay

## 2020-10-09 DIAGNOSIS — M6281 Muscle weakness (generalized): Secondary | ICD-10-CM | POA: Diagnosis not present

## 2020-10-09 DIAGNOSIS — R2681 Unsteadiness on feet: Secondary | ICD-10-CM

## 2020-10-09 DIAGNOSIS — R296 Repeated falls: Secondary | ICD-10-CM

## 2020-10-09 DIAGNOSIS — R2689 Other abnormalities of gait and mobility: Secondary | ICD-10-CM

## 2020-10-09 NOTE — Therapy (Signed)
[facility] [address], [place], [place], [address] Phone: [phone]   Fax:  [phone]  Physical Therapy Treatment  Patient Details  Name: [name] MRN: [id number] Date of Birth: 08/03/1932 Referring Provider (PT): Earleen Newport, MD   Encounter Date: 10/09/2020   PT End of Session - 10/09/20 1316    Visit Number 12    Date for PT Re-Evaluation 11/09/20    Authorization Type Medicare and [employer]; cert period set for 90 days due to pt will be out of town for 10 days from Sept 24th > October 7th.    Progress Note Due on Visit 20    PT Start Time 1234    PT Stop Time 1312    PT Time Calculation (min) 38 min    Activity Tolerance Patient tolerated treatment well;No increased pain    Behavior During Therapy WFL for tasks assessed/performed           Past Medical History:  Diagnosis Date  . Allergic rhinitis, cause unspecified 03/12/2013  . Allergy   . CAD (coronary artery disease)    S/P CABG in 1999   . Cataract   . Decreased exercise tolerance    Exertional fatigue  . Degenerative arthritis of hip   . Diverticulosis of colon   . ED (erectile dysfunction)   . Erectile dysfunction 06/03/2014  . History of colonic polyps   . History of kidney stones   . History of MI (myocardial infarction)   . Hx of adenomatous colonic polyps 2000  . Hyperlipidemia    Low HDL  . Lumbar disc disease    Hx of with recent back pain and buttock pain  . Myocardial infarction ([place]) 1999  . Right knee meniscal tear   . S/P removal of thyroid nodule   . Spinal stenosis of lumbar region 03/15/2014    Past Surgical History:  Procedure Laterality Date  . CARDIAC CATHETERIZATION    . CATARACT EXTRACTION    . COLONOSCOPY    . CORONARY ARTERY BYPASS GRAFT  1999  . CYSTOSCOPY/URETEROSCOPY/HOLMIUM LASER/STENT PLACEMENT Left 12/23/2017   Procedure: CYSTOSCOPY/RETROGRADE/URETEROSCOPY/HOLMIUM LASER/STENT PLACEMENT;   Surgeon: Festus Aloe, MD;  Location: [facility];  Service: Urology;  Laterality: Left;  ONLY NEEDS 60 MIN  . LUMBAR DISC SURGERY    . Nodules on Thyroid    . POLYPECTOMY    . PTCA    . SHOULDER ARTHROSCOPY WITH ROTATOR CUFF REPAIR AND SUBACROMIAL DECOMPRESSION Right 01/30/2015   Procedure: RIGHT ARTHROSCOPY SHOULDER SUBACROMIAL DECOMPRESSION DISTAL CLAVICAL RESECTION RETATOR CUFF REPAIR;  Surgeon: Justice Britain, MD;  Location: [name];  Service: Orthopedics;  Laterality: Right;  . TONSILLECTOMY    . VASECTOMY      There were no vitals filed for this visit.   Subjective Assessment - 10/09/20 1241    Subjective I walked at the park and just carried my cane.  I walked for 10 minutes then had a seated rest break and repeated 3 times.    Patient Stated Goals Would like to improve balance to be able to walk longer distances, strengthen R calf, be able to get out of a chair, turn and bend forward without fear of falling.    Currently in Pain? No/denies                             [facility] Adult PT Treatment/Exercise - 10/09/20 0001  Neuro Re-ed    Neuro Re-ed Details  red ball toss with various standing positions bounce catch; standing on black foam with head turns and UE movements      Knee/Hip Exercises: Aerobic   Nustep L3 10 min while discussing status/progress       Knee/Hip Exercises: Machines for Strengthening   Total Gym Leg Press Seat 7: BLE 95# 30 reps, Rt & Lt LE 50# 30x each      Knee/Hip Exercises: Standing   Other Standing Knee Exercises stepping over forward and laterally over obstacle (pool noodles and up and over 6" step)  10x each     Other Standing Knee Exercises step taps on edge of treadmill                    PT Short Term Goals - 09/10/20 1538      PT SHORT TERM GOAL #1   Title Pt will be independent with initial HEP before he travels on Sept 24th    Status Achieved      PT SHORT TERM GOAL #3   Title Pt will improve BERG balance  score to >/= 45/56    Baseline 46/56    Status Achieved      PT SHORT TERM GOAL #4   Title Pt will safely negotiate curb with cane and supervision x 5 reps with improved foot clearance.    Baseline able to do using a 4" step    Status Achieved             PT Long Term Goals - 10/01/20 1400      PT LONG TERM GOAL #2   Title Pt will demonstrate ability to perform 5-6 single leg heel lifts on RLE to improve push off for gait    Baseline --    Time 10    Period Weeks    Status On-going      PT LONG TERM GOAL #3   Title Pt will improve BERG balance score to >/= 50/56 to indicate decreased risk for falls    Baseline 48/56- high falls risk    Time 10    Status On-going      PT LONG TERM GOAL #4   Title Pt will negotiate 12 stairs with one rail, alternating sequence without catching either foot on stairs, MOD I    Baseline not doing this  with step-over-step.  No issues with catching foot when doing step-to    Time 10    Period Weeks    Status On-going      PT LONG TERM GOAL #5   Title Pt will demonstrate ability to stand from low chair (14") without use of UE x 5 reps in </= 12 seconds    Baseline 10 seconds from chair in clinic- no 14" chairs here (18" chair)    Time 10    Period Weeks    Status On-going                 Plan - 10/09/20 1249    Clinical Impression Statement Pt was able to walk at the park only holding his cane and not using it.  Pt walks 10 minutes and then rests and repeats 3 times. The patient is able to participate in moderate intensity balance challenges today with min assist/CGA from PT for safety.  Pt demonstrates some compensatory strategy initially with a new exercise/balance activity.  He demonstrates shortened step length and knee flexion with gait on  level surface.  Pt is challenged by exercise and able to make corrections to maintain balance with external perturbations.  Pt is compliant with HEP and regular walking.  Pt will continue to  benefit from skilled PT to address strength, balance and endurance.    PT Frequency 2x / week    PT Duration 8 weeks    PT Treatment/Interventions ADLs/Self Care Home Management;Aquatic Therapy;Electrical Stimulation;DME Instruction;Gait training;Stair training;Functional mobility training;Therapeutic activities;Therapeutic exercise;Balance training;Neuromuscular re-education;Patient/family education    PT Next Visit Plan balance, strength, endurance    PT Home Exercise Plan [id number]    Consulted and Agree with Plan of Care Patient           Patient will benefit from skilled therapeutic intervention in order to improve the following deficits and impairments:  Abnormal gait, Decreased balance, Decreased strength, Difficulty walking  Visit Diagnosis: Repeated falls  Muscle weakness (generalized)  Other abnormalities of gait and mobility  Unsteadiness on feet     Problem List Patient Active Problem List   Diagnosis Date Noted  . Dysfunction of Eustachian tube, bilateral 07/21/2020  . Gait disorder 04/20/2016  . Hyperglycemia 04/20/2016  . Abnormal CT scan, chest 06/03/2014  . Erectile dysfunction 06/03/2014  . Spinal stenosis of lumbar region 03/15/2014  . Cough 03/12/2013  . Allergic rhinitis 03/12/2013  . Leg pain, bilateral 12/30/2011  . Bladder neck obstruction 12/30/2011  . Preventative health care 12/25/2011  . Lumbar disc disease 12/25/2011  . S/P removal of thyroid nodule 12/25/2011  . History of MI (myocardial infarction) 12/25/2011  . Right knee meniscal tear 12/25/2011  . Degenerative arthritis of hip 12/25/2011  . HIP PAIN, RIGHT 05/14/2010  . FATIGUE 10/13/2009  . HYPERTENSION, BENIGN 02/25/2009  . CAD, AUTOLOGOUS BYPASS GRAFT 02/25/2009  . CORONARY ARTERY ANEURYSM 02/25/2009  . BACK PAIN 04/15/2008  . Hyperlipidemia 02/13/2008  . CORONARY ARTERY DISEASE 02/13/2008  . DIVERTICULOSIS, COLON 02/13/2008  . CARDIAC DISEASE, HX OF 02/13/2008  . COLONIC  POLYPS, HX OF 02/13/2008     Sigurd Sos, PT 10/09/20 1:18 PM  [facility] [address] [place], [place], [address] Phone: [phone]   Fax:  [phone]  Name: [name] MRN: [id number] Date of Birth: March 05, 1932

## 2020-10-12 ENCOUNTER — Encounter: Payer: Self-pay | Admitting: Internal Medicine

## 2020-10-12 NOTE — Assessment & Plan Note (Signed)
stable overall by history and exam, recent data reviewed with pt, and pt to continue medical treatment as before,  to f/u any worsening symptoms or concerns  

## 2020-10-13 DIAGNOSIS — R350 Frequency of micturition: Secondary | ICD-10-CM | POA: Diagnosis not present

## 2020-10-13 DIAGNOSIS — N200 Calculus of kidney: Secondary | ICD-10-CM | POA: Diagnosis not present

## 2020-10-13 DIAGNOSIS — N281 Cyst of kidney, acquired: Secondary | ICD-10-CM | POA: Diagnosis not present

## 2020-10-13 DIAGNOSIS — N401 Enlarged prostate with lower urinary tract symptoms: Secondary | ICD-10-CM | POA: Diagnosis not present

## 2020-10-14 ENCOUNTER — Encounter: Payer: Self-pay | Admitting: Physical Therapy

## 2020-10-14 ENCOUNTER — Other Ambulatory Visit: Payer: Self-pay

## 2020-10-14 ENCOUNTER — Ambulatory Visit: Payer: Medicare Other | Admitting: Physical Therapy

## 2020-10-14 DIAGNOSIS — M6281 Muscle weakness (generalized): Secondary | ICD-10-CM | POA: Diagnosis not present

## 2020-10-14 DIAGNOSIS — R296 Repeated falls: Secondary | ICD-10-CM

## 2020-10-14 DIAGNOSIS — R2689 Other abnormalities of gait and mobility: Secondary | ICD-10-CM | POA: Diagnosis not present

## 2020-10-14 DIAGNOSIS — R2681 Unsteadiness on feet: Secondary | ICD-10-CM

## 2020-10-14 NOTE — Therapy (Signed)
[facility] [address] [place], [place], [address] Phone: [phone]   Fax:  [phone]  Physical Therapy Treatment  Patient Details  Name: [name] MRN: [id number] Date of Birth: 02-05-1932 Referring Provider (PT): Earleen Newport, MD   Encounter Date: 10/14/2020   PT End of Session - 10/14/20 1230    Visit Number 13    Date for PT Re-Evaluation 11/09/20    Authorization Type Medicare and [employer]; cert period set for 90 days due to pt will be out of town for 10 days from Sept 24th > October 7th.    Progress Note Due on Visit 20    PT Start Time 1230    PT Stop Time 1310    PT Time Calculation (min) 40 min    Activity Tolerance Patient tolerated treatment well;No increased pain    Behavior During Therapy [facility] for tasks assessed/performed           Past Medical History:  Diagnosis Date   Allergic rhinitis, cause unspecified 03/12/2013   Allergy    CAD (coronary artery disease)    S/P CABG in 1999    Cataract    Decreased exercise tolerance    Exertional fatigue   Degenerative arthritis of hip    Diverticulosis of colon    ED (erectile dysfunction)    Erectile dysfunction 06/03/2014   History of colonic polyps    History of kidney stones    History of MI (myocardial infarction)    Hx of adenomatous colonic polyps 2000   Hyperlipidemia    Low HDL   Lumbar disc disease    Hx of with recent back pain and buttock pain   Myocardial infarction ([place]) 1999   Right knee meniscal tear    S/P removal of thyroid nodule    Spinal stenosis of lumbar region 03/15/2014    Past Surgical History:  Procedure Laterality Date   CARDIAC CATHETERIZATION     CATARACT EXTRACTION     COLONOSCOPY     CORONARY ARTERY BYPASS GRAFT  1999   CYSTOSCOPY/URETEROSCOPY/HOLMIUM LASER/STENT PLACEMENT Left 12/23/2017   Procedure: CYSTOSCOPY/RETROGRADE/URETEROSCOPY/HOLMIUM LASER/STENT PLACEMENT;   Surgeon: Festus Aloe, MD;  Location: [facility];  Service: Urology;  Laterality: Left;  ONLY NEEDS 60 MIN   LUMBAR DISC SURGERY     Nodules on Thyroid     POLYPECTOMY     PTCA     SHOULDER ARTHROSCOPY WITH ROTATOR CUFF REPAIR AND SUBACROMIAL DECOMPRESSION Right 01/30/2015   Procedure: RIGHT ARTHROSCOPY SHOULDER SUBACROMIAL DECOMPRESSION DISTAL CLAVICAL RESECTION RETATOR CUFF REPAIR;  Surgeon: Justice Britain, MD;  Location: [facility];  Service: Orthopedics;  Laterality: Right;   TONSILLECTOMY     VASECTOMY      There were no vitals filed for this visit.   Subjective Assessment - 10/14/20 1233    Subjective Pt states that he is doing well. He is trying not to use his cane as much at the park.    Patient Stated Goals Would like to improve balance to be able to walk longer distances, strengthen R calf, be able to get out of a chair, turn and bend forward without fear of falling.    Currently in Pain? No/denies                             [facility] Adult PT Treatment/Exercise - 10/14/20 0001      Knee/Hip Exercises: Aerobic   Nustep  L4 x10 min       Knee/Hip Exercises: Machines for Strengthening   Total Gym Leg Press Seat 7: BLE #105 2x10 reps, single leg #60 2x10 reps       Knee/Hip Exercises: Seated   Sit to Sand 1 set;10 reps   touching seat only- holding 2 #5 dumbbells               Balance Exercises - 10/14/20 0001      Balance Exercises: Standing   Standing, One Foot on a Step Eyes open;6 inch   LE lift 3x10 sec, CGA-PT cuing to increase quad activation R   Step Ups 6 inch;UE support 1;Forward   opposite LE skipping a step    Step Over Hurdles / Cones lateral step over and back each LE x10 reps,                PT Short Term Goals - 09/10/20 1538      PT SHORT TERM GOAL #1   Title Pt will be independent with initial HEP before he travels on Sept 24th    Status Achieved      PT SHORT TERM GOAL #3   Title Pt will improve BERG balance score to  >/= 45/56    Baseline 46/56    Status Achieved      PT SHORT TERM GOAL #4   Title Pt will safely negotiate curb with cane and supervision x 5 reps with improved foot clearance.    Baseline able to do using a 4" step    Status Achieved             PT Long Term Goals - 10/01/20 1400      PT LONG TERM GOAL #2   Title Pt will demonstrate ability to perform 5-6 single leg heel lifts on RLE to improve push off for gait    Baseline --    Time 10    Period Weeks    Status On-going      PT LONG TERM GOAL #3   Title Pt will improve BERG balance score to >/= 50/56 to indicate decreased risk for falls    Baseline 48/56- high falls risk    Time 10    Status On-going      PT LONG TERM GOAL #4   Title Pt will negotiate 12 stairs with one rail, alternating sequence without catching either foot on stairs, MOD I    Baseline not doing this  with step-over-step.  No issues with catching foot when doing step-to    Time 10    Period Weeks    Status On-going      PT LONG TERM GOAL #5   Title Pt will demonstrate ability to stand from low chair (14") without use of UE x 5 reps in </= 12 seconds    Baseline 10 seconds from chair in clinic- no 14" chairs here (18" chair)    Time 10    Period Weeks    Status On-going                 Plan - 10/14/20 1314    Clinical Impression Statement Pt continues to report adherence to his HEP and is gaining confidence with his walking, often holding his cane instead of needing it on the ground for support. Pt was able to complete progressions in resistance training of the LEs without any noted difficulty. PT made some updates to pts HEP to increase the level of  difficulty with sit to stands and step ups. Pt had noted fatigue on the Rt LE with knee flexion during Rt single leg stance, this was improved with PT providing tactile cuing. He was able to complete single leg stance up to 5 sec bilaterally without UE support.    PT Frequency 2x / week    PT  Duration 8 weeks    PT Treatment/Interventions ADLs/Self Care Home Management;Aquatic Therapy;Electrical Stimulation;DME Instruction;Gait training;Stair training;Functional mobility training;Therapeutic activities;Therapeutic exercise;Balance training;Neuromuscular re-education;Patient/family education    PT Next Visit Plan balance, strength, endurance, check goals    PT Home Exercise Plan [id number]    Consulted and Agree with Plan of Care Patient           Patient will benefit from skilled therapeutic intervention in order to improve the following deficits and impairments:  Abnormal gait, Decreased balance, Decreased strength, Difficulty walking  Visit Diagnosis: Repeated falls  Muscle weakness (generalized)  Other abnormalities of gait and mobility  Unsteadiness on feet     Problem List Patient Active Problem List   Diagnosis Date Noted   Dysfunction of Eustachian tube, bilateral 07/21/2020   Gait disorder 04/20/2016   Hyperglycemia 04/20/2016   Abnormal CT scan, chest 06/03/2014   Erectile dysfunction 06/03/2014   Spinal stenosis of lumbar region 03/15/2014   Cough 03/12/2013   Allergic rhinitis 03/12/2013   Leg pain, bilateral 12/30/2011   Bladder neck obstruction 12/30/2011   Preventative health care 12/25/2011   Lumbar disc disease 12/25/2011   S/P removal of thyroid nodule 12/25/2011   History of MI (myocardial infarction) 12/25/2011   Right knee meniscal tear 12/25/2011   Degenerative arthritis of hip 12/25/2011   HIP PAIN, RIGHT 05/14/2010   FATIGUE 10/13/2009   HYPERTENSION, BENIGN 02/25/2009   CAD, AUTOLOGOUS BYPASS GRAFT 02/25/2009   CORONARY ARTERY ANEURYSM 02/25/2009   BACK PAIN 04/15/2008   Hyperlipidemia 02/13/2008   CORONARY ARTERY DISEASE 02/13/2008   DIVERTICULOSIS, COLON 02/13/2008   CARDIAC DISEASE, HX OF 02/13/2008   COLONIC POLYPS, HX OF 02/13/2008   1:32 PM,10/14/20 Sherol Dade PT, DPT [facility] [address], [place], [place], [address] Phone: [phone]   Fax:  [phone]  Name: [name] MRN: [id number] Date of Birth: January 20, 1932

## 2020-10-16 ENCOUNTER — Other Ambulatory Visit: Payer: Self-pay

## 2020-10-16 ENCOUNTER — Ambulatory Visit: Payer: Medicare Other | Admitting: Physical Therapy

## 2020-10-16 DIAGNOSIS — M6281 Muscle weakness (generalized): Secondary | ICD-10-CM | POA: Diagnosis not present

## 2020-10-16 DIAGNOSIS — R2681 Unsteadiness on feet: Secondary | ICD-10-CM | POA: Diagnosis not present

## 2020-10-16 DIAGNOSIS — R296 Repeated falls: Secondary | ICD-10-CM

## 2020-10-16 DIAGNOSIS — R2689 Other abnormalities of gait and mobility: Secondary | ICD-10-CM

## 2020-10-16 NOTE — Therapy (Signed)
[facility] [address] [place], [place], [address] Phone: [phone]   Fax:  [phone]  Physical Therapy Treatment  Patient Details  Name: Larry [name] MRN: [id number] Date of Birth: 05-14-1932 Referring Provider (PT): Earleen Newport, MD   Encounter Date: 10/16/2020   PT End of Session - 10/16/20 1611    Visit Number 14    Number of Visits 17    Date for PT Re-Evaluation 11/09/20    Authorization Type Medicare and [employer]; cert period set for 90 days due to pt will be out of town for 10 days from Sept 24th > October 7th.    Progress Note Due on Visit 20    PT Start Time 1400    PT Stop Time 1444    PT Time Calculation (min) 44 min    Activity Tolerance Patient tolerated treatment well           Past Medical History:  Diagnosis Date  . Allergic rhinitis, cause unspecified 03/12/2013  . Allergy   . CAD (coronary artery disease)    S/P CABG in 1999   . Cataract   . Decreased exercise tolerance    Exertional fatigue  . Degenerative arthritis of hip   . Diverticulosis of colon   . ED (erectile dysfunction)   . Erectile dysfunction 06/03/2014  . History of colonic polyps   . History of kidney stones   . History of MI (myocardial infarction)   . Hx of adenomatous colonic polyps 2000  . Hyperlipidemia    Low HDL  . Lumbar disc disease    Hx of with recent back pain and buttock pain  . Myocardial infarction ([place]) 1999  . Right knee meniscal tear   . S/P removal of thyroid nodule   . Spinal stenosis of lumbar region 03/15/2014    Past Surgical History:  Procedure Laterality Date  . CARDIAC CATHETERIZATION    . CATARACT EXTRACTION    . COLONOSCOPY    . CORONARY ARTERY BYPASS GRAFT  1999  . CYSTOSCOPY/URETEROSCOPY/HOLMIUM LASER/STENT PLACEMENT Left 12/23/2017   Procedure: CYSTOSCOPY/RETROGRADE/URETEROSCOPY/HOLMIUM LASER/STENT PLACEMENT;  Surgeon: Festus Aloe, MD;  Location: [facility];   Service: Urology;  Laterality: Left;  ONLY NEEDS 60 MIN  . LUMBAR DISC SURGERY    . Nodules on Thyroid    . POLYPECTOMY    . PTCA    . SHOULDER ARTHROSCOPY WITH ROTATOR CUFF REPAIR AND SUBACROMIAL DECOMPRESSION Right 01/30/2015   Procedure: RIGHT ARTHROSCOPY SHOULDER SUBACROMIAL DECOMPRESSION DISTAL CLAVICAL RESECTION RETATOR CUFF REPAIR;  Surgeon: Justice Britain, MD;  Location: [facility];  Service: Orthopedics;  Laterality: Right;  . TONSILLECTOMY    . VASECTOMY      There were no vitals filed for this visit.   Subjective Assessment - 10/16/20 1406    Subjective [place] to the [place] tonight.    Pertinent History In 1964 had disc removed with residual right LE weakness    Diagnostic tests Xray demonstrated lumbar scoliosis and spondylolisthesis stable from a year ago.    Patient Stated Goals Would like to improve balance to be able to walk longer distances, strengthen R calf, be able to get out of a chair, turn and bend forward without fear of falling.    Currently in Pain? No/denies                             [facility] Adult PT Treatment/Exercise - 10/16/20 0001  Therapeutic Activites    Therapeutic Activities ADL's;Lifting    ADL's sit to stand; stairs, lifting, carrying     Lifting 10# kettle bell to shin level 10x       Neuro Re-ed    Neuro Re-ed Details  box stepping 2 feet 8x; single leg box step with box taps needs mod assist from therapist       Knee/Hip Exercises: Stretches   Other Knee/Hip Stretches 2nd step hip flexor stretch with UE movements 10x     Other Knee/Hip Stretches 2nd step stretch for HS 8x right/left       Knee/Hip Exercises: Aerobic   Nustep L3 10 min while discussing status/progress       Knee/Hip Exercises: Machines for Strengthening   Total Gym Leg Press Seat 7: BLE #105 3x10 reps      Knee/Hip Exercises: Standing   Wall Squat Limitations wall push up 10x    Other Standing Knee Exercises wall climbs 10x     Other Standing Knee  Exercises wall ankle pull aways 15x       Knee/Hip Exercises: Seated   Sit to Sand 1 set;10 reps   touching seat only- holding 2 #5 dumbbells                    PT Short Term Goals - 09/10/20 1538      PT SHORT TERM GOAL #1   Title Pt will be independent with initial HEP before he travels on Sept 24th    Status Achieved      PT SHORT TERM GOAL #3   Title Pt will improve BERG balance score to >/= 45/56    Baseline 46/56    Status Achieved      PT SHORT TERM GOAL #4   Title Pt will safely negotiate curb with cane and supervision x 5 reps with improved foot clearance.    Baseline able to do using a 4" step    Status Achieved             PT Long Term Goals - 10/01/20 1400      PT LONG TERM GOAL #2   Title Pt will demonstrate ability to perform 5-6 single leg heel lifts on RLE to improve push off for gait    Baseline --    Time 10    Period Weeks    Status On-going      PT LONG TERM GOAL #3   Title Pt will improve BERG balance score to >/= 50/56 to indicate decreased risk for falls    Baseline 48/56- high falls risk    Time 10    Status On-going      PT LONG TERM GOAL #4   Title Pt will negotiate 12 stairs with one rail, alternating sequence without catching either foot on stairs, MOD I    Baseline not doing this  with step-over-step.  No issues with catching foot when doing step-to    Time 10    Period Weeks    Status On-going      PT LONG TERM GOAL #5   Title Pt will demonstrate ability to stand from low chair (14") without use of UE x 5 reps in </= 12 seconds    Baseline 10 seconds from chair in clinic- no 14" chairs here (18" chair)    Time 10    Period Weeks    Status On-going  Plan - 10/16/20 1753    Clinical Impression Statement The patient reports an overall "10/20" perceived exertion level.  He does need encouragement to take some rest breaks between ex's as he becomes slightly short of breath with exertion and with  wearing a mask while exercising.  He does well with ex's with double leg stance position but with single limb stance he needs min to mod assist.  He is able to progressively increase intensity of exercise and remains highly motivated with new challenges.  Therapist monitoring response with all and modifying accordingly.    Comorbidities allergic rhinitis, CAD s/p CABG in 1999, OA of hip, h/o MI, adenomatous colonic polyps, HLD, lumbar disc disease, R knee meniscus tear, removal of thyroid nodule, lumbar spinal stenosis, shoulder arthroscopy wtih rotator cuff repair and subacromial decompression    Rehab Potential Good    PT Frequency 2x / week    PT Duration 8 weeks    PT Treatment/Interventions ADLs/Self Care Home Management;Aquatic Therapy;Electrical Stimulation;DME Instruction;Gait training;Stair training;Functional mobility training;Therapeutic activities;Therapeutic exercise;Balance training;Neuromuscular re-education;Patient/family education    PT Next Visit Plan balance, strength, endurance, check goals including BERG recheck    PT Home Exercise Plan [id number]           Patient will benefit from skilled therapeutic intervention in order to improve the following deficits and impairments:  Abnormal gait, Decreased balance, Decreased strength, Difficulty walking  Visit Diagnosis: Repeated falls  Muscle weakness (generalized)  Other abnormalities of gait and mobility  Unsteadiness on feet     Problem List Patient Active Problem List   Diagnosis Date Noted  . Dysfunction of Eustachian tube, bilateral 07/21/2020  . Gait disorder 04/20/2016  . Hyperglycemia 04/20/2016  . Abnormal CT scan, chest 06/03/2014  . Erectile dysfunction 06/03/2014  . Spinal stenosis of lumbar region 03/15/2014  . Cough 03/12/2013  . Allergic rhinitis 03/12/2013  . Leg pain, bilateral 12/30/2011  . Bladder neck obstruction 12/30/2011  . Preventative health care 12/25/2011  . Lumbar disc disease  12/25/2011  . S/P removal of thyroid nodule 12/25/2011  . History of MI (myocardial infarction) 12/25/2011  . Right knee meniscal tear 12/25/2011  . Degenerative arthritis of hip 12/25/2011  . HIP PAIN, RIGHT 05/14/2010  . FATIGUE 10/13/2009  . HYPERTENSION, BENIGN 02/25/2009  . CAD, AUTOLOGOUS BYPASS GRAFT 02/25/2009  . CORONARY ARTERY ANEURYSM 02/25/2009  . BACK PAIN 04/15/2008  . Hyperlipidemia 02/13/2008  . CORONARY ARTERY DISEASE 02/13/2008  . DIVERTICULOSIS, COLON 02/13/2008  . CARDIAC DISEASE, HX OF 02/13/2008  . COLONIC POLYPS, HX OF 02/13/2008   Ruben Im, PT 10/16/20 6:01 PM Phone: [phone] Fax: [phone] Alvera Singh 10/16/2020, 6:01 PM  [facility] [address], [place], [place], [address] Phone: [phone]   Fax:  [phone]  Name: Larry [name] MRN: [id number] Date of Birth: 10-May-1932

## 2020-10-20 ENCOUNTER — Other Ambulatory Visit: Payer: Self-pay

## 2020-10-20 ENCOUNTER — Ambulatory Visit: Payer: Medicare Other | Attending: Neurological Surgery

## 2020-10-20 DIAGNOSIS — M6281 Muscle weakness (generalized): Secondary | ICD-10-CM | POA: Diagnosis not present

## 2020-10-20 DIAGNOSIS — R2681 Unsteadiness on feet: Secondary | ICD-10-CM | POA: Diagnosis not present

## 2020-10-20 DIAGNOSIS — R2689 Other abnormalities of gait and mobility: Secondary | ICD-10-CM | POA: Diagnosis not present

## 2020-10-20 DIAGNOSIS — R296 Repeated falls: Secondary | ICD-10-CM | POA: Insufficient documentation

## 2020-10-20 NOTE — Therapy (Signed)
[facility] [address], [place], [place], [address] Phone: [phone]   Fax:  [phone]  Physical Therapy Treatment  Patient Details  Name: Larry [name] MRN: [id number] Date of Birth: 06-Sep-1932 Referring Provider (PT): Earleen Newport, MD   Encounter Date: 10/20/2020   PT End of Session - 10/20/20 1525    Visit Number 15    Date for PT Re-Evaluation 11/09/20    Authorization Type Medicare and [employer]; cert period set for 90 days due to pt will be out of town for 10 days from Sept 24th > October 7th.    Progress Note Due on Visit 20    PT Start Time 1445    PT Stop Time 1527    PT Time Calculation (min) 42 min    Activity Tolerance Patient tolerated treatment well    Behavior During Therapy WFL for tasks assessed/performed           Past Medical History:  Diagnosis Date  . Allergic rhinitis, cause unspecified 03/12/2013  . Allergy   . CAD (coronary artery disease)    S/P CABG in 1999   . Cataract   . Decreased exercise tolerance    Exertional fatigue  . Degenerative arthritis of hip   . Diverticulosis of colon   . ED (erectile dysfunction)   . Erectile dysfunction 06/03/2014  . History of colonic polyps   . History of kidney stones   . History of MI (myocardial infarction)   . Hx of adenomatous colonic polyps 2000  . Hyperlipidemia    Low HDL  . Lumbar disc disease    Hx of with recent back pain and buttock pain  . Myocardial infarction ([place]) 1999  . Right knee meniscal tear   . S/P removal of thyroid nodule   . Spinal stenosis of lumbar region 03/15/2014    Past Surgical History:  Procedure Laterality Date  . CARDIAC CATHETERIZATION    . CATARACT EXTRACTION    . COLONOSCOPY    . CORONARY ARTERY BYPASS GRAFT  1999  . CYSTOSCOPY/URETEROSCOPY/HOLMIUM LASER/STENT PLACEMENT Left 12/23/2017   Procedure: CYSTOSCOPY/RETROGRADE/URETEROSCOPY/HOLMIUM LASER/STENT PLACEMENT;  Surgeon: Festus Aloe, MD;  Location: [facility];  Service: Urology;  Laterality: Left;  ONLY NEEDS 60 MIN  . LUMBAR DISC SURGERY    . Nodules on Thyroid    . POLYPECTOMY    . PTCA    . SHOULDER ARTHROSCOPY WITH ROTATOR CUFF REPAIR AND SUBACROMIAL DECOMPRESSION Right 01/30/2015   Procedure: RIGHT ARTHROSCOPY SHOULDER SUBACROMIAL DECOMPRESSION DISTAL CLAVICAL RESECTION RETATOR CUFF REPAIR;  Surgeon: Justice Britain, MD;  Location: [facility];  Service: Orthopedics;  Laterality: Right;  . TONSILLECTOMY    . VASECTOMY      There were no vitals filed for this visit.   Subjective Assessment - 10/20/20 1447    Subjective I walked around the park yesterday and didn't use my cane.  I just held the cane.  I did the bike at home both days this weekend.    Patient Stated Goals Would like to improve balance to be able to walk longer distances, strengthen R calf, be able to get out of a chair, turn and bend forward without fear of falling.    Currently in Pain? No/denies                             [facility] Adult PT Treatment/Exercise - 10/20/20 0001  Knee/Hip Exercises: Aerobic   Nustep L4 10 min while discussing status/progress       Knee/Hip Exercises: Machines for Strengthening   Total Gym Leg Press Seat 7: BLE 105# 3x10 reps, 60# single leg 2x10      Knee/Hip Exercises: Standing   Lateral Step Up Limitations step up and over 4" step with minimal UE support x10 reps    Forward Step Up Limitations step taps 4" alternating without UE support    Wall Squat Limitations wall push up 2x10    Other Standing Knee Exercises wall climbs 10x     Other Standing Knee Exercises standing on foam pad: trunk rotations, feet together with arm raises x 5, modified tandem stance 2x20 seconds       Knee/Hip Exercises: Seated   Sit to Sand 2 sets;10 reps   holding 10# kettle bell- taps on chair                   PT Short Term Goals - 09/10/20 1538      PT SHORT TERM GOAL #1   Title Pt will be  independent with initial HEP before he travels on Sept 24th    Status Achieved      PT SHORT TERM GOAL #3   Title Pt will improve BERG balance score to >/= 45/56    Baseline 46/56    Status Achieved      PT SHORT TERM GOAL #4   Title Pt will safely negotiate curb with cane and supervision x 5 reps with improved foot clearance.    Baseline able to do using a 4" step    Status Achieved             PT Long Term Goals - 10/20/20 1504      PT LONG TERM GOAL #5   Title Pt will demonstrate ability to stand from low chair (14") without use of UE x 5 reps in </= 12 seconds    Baseline 10 seconds from chair in clinic- no 14" chairs here (18" chair)    Status Achieved                 Plan - 10/20/20 1501    Clinical Impression Statement Pt continues to exercise regularly with walking at the park and doing his bike at home.  Pt requires some encouragement to take longer rest breaks during exercise in the clinic.   Pt continues to be challenged by higher level balance tasks and requires closer supervision for safety.  He is able to progressively increase intensity of exercise and remains highly motivated with new challenges.  Therapist monitoring response with all and modifying accordingly.    PT Frequency 2x / week    PT Duration 8 weeks    PT Treatment/Interventions ADLs/Self Care Home Management;Aquatic Therapy;Electrical Stimulation;DME Instruction;Gait training;Stair training;Functional mobility training;Therapeutic activities;Therapeutic exercise;Balance training;Neuromuscular re-education;Patient/family education    PT Next Visit Plan balance, strength, endurance, check goals including BERG recheck           Patient will benefit from skilled therapeutic intervention in order to improve the following deficits and impairments:  Abnormal gait, Decreased balance, Decreased strength, Difficulty walking  Visit Diagnosis: Repeated falls  Muscle weakness (generalized)  Other  abnormalities of gait and mobility  Unsteadiness on feet     Problem List Patient Active Problem List   Diagnosis Date Noted  . Dysfunction of Eustachian tube, bilateral 07/21/2020  . Gait disorder 04/20/2016  . Hyperglycemia 04/20/2016  .  Abnormal CT scan, chest 06/03/2014  . Erectile dysfunction 06/03/2014  . Spinal stenosis of lumbar region 03/15/2014  . Cough 03/12/2013  . Allergic rhinitis 03/12/2013  . Leg pain, bilateral 12/30/2011  . Bladder neck obstruction 12/30/2011  . Preventative health care 12/25/2011  . Lumbar disc disease 12/25/2011  . S/P removal of thyroid nodule 12/25/2011  . History of MI (myocardial infarction) 12/25/2011  . Right knee meniscal tear 12/25/2011  . Degenerative arthritis of hip 12/25/2011  . HIP PAIN, RIGHT 05/14/2010  . FATIGUE 10/13/2009  . HYPERTENSION, BENIGN 02/25/2009  . CAD, AUTOLOGOUS BYPASS GRAFT 02/25/2009  . CORONARY ARTERY ANEURYSM 02/25/2009  . BACK PAIN 04/15/2008  . Hyperlipidemia 02/13/2008  . CORONARY ARTERY DISEASE 02/13/2008  . DIVERTICULOSIS, COLON 02/13/2008  . CARDIAC DISEASE, HX OF 02/13/2008  . COLONIC POLYPS, HX OF 02/13/2008     Sigurd Sos, PT 10/20/20 3:29 PM  [facility] [address], [place], [place], [address] Phone: [phone]   Fax:  [phone]  Name: Larry [name] MRN: [id number] Date of Birth: [birth year]

## 2020-10-23 ENCOUNTER — Other Ambulatory Visit: Payer: Self-pay

## 2020-10-23 ENCOUNTER — Ambulatory Visit: Payer: Medicare Other | Admitting: Physical Therapy

## 2020-10-23 DIAGNOSIS — R2681 Unsteadiness on feet: Secondary | ICD-10-CM | POA: Diagnosis not present

## 2020-10-23 DIAGNOSIS — R2689 Other abnormalities of gait and mobility: Secondary | ICD-10-CM

## 2020-10-23 DIAGNOSIS — R296 Repeated falls: Secondary | ICD-10-CM | POA: Diagnosis not present

## 2020-10-23 DIAGNOSIS — M6281 Muscle weakness (generalized): Secondary | ICD-10-CM

## 2020-10-23 NOTE — Therapy (Signed)
[facility] [address], [place], [place], [address] Phone: [phone]   Fax:  [phone]  Physical Therapy Treatment  Patient Details  Name: Larry [name] MRN: [id number] Date of Birth: 01/16/1932 Referring Provider (PT): Earleen Newport, MD   Encounter Date: 10/23/2020   PT End of Session - 10/23/20 1349    Visit Number 16    Date for PT Re-Evaluation 11/09/20    Authorization Type Medicare and [employer]; cert period set for 90 days due to pt will be out of town for 10 days from Sept 24th > October 7th.    Progress Note Due on Visit 20    PT Start Time 1230    PT Stop Time 1315    PT Time Calculation (min) 45 min    Activity Tolerance Patient tolerated treatment well           Past Medical History:  Diagnosis Date  . Allergic rhinitis, cause unspecified 03/12/2013  . Allergy   . CAD (coronary artery disease)    S/P CABG in 1999   . Cataract   . Decreased exercise tolerance    Exertional fatigue  . Degenerative arthritis of hip   . Diverticulosis of colon   . ED (erectile dysfunction)   . Erectile dysfunction 06/03/2014  . History of colonic polyps   . History of kidney stones   . History of MI (myocardial infarction)   . Hx of adenomatous colonic polyps 2000  . Hyperlipidemia    Low HDL  . Lumbar disc disease    Hx of with recent back pain and buttock pain  . Myocardial infarction ([place]) 1999  . Right knee meniscal tear   . S/P removal of thyroid nodule   . Spinal stenosis of lumbar region 03/15/2014    Past Surgical History:  Procedure Laterality Date  . CARDIAC CATHETERIZATION    . CATARACT EXTRACTION    . COLONOSCOPY    . CORONARY ARTERY BYPASS GRAFT  1999  . CYSTOSCOPY/URETEROSCOPY/HOLMIUM LASER/STENT PLACEMENT Left 12/23/2017   Procedure: CYSTOSCOPY/RETROGRADE/URETEROSCOPY/HOLMIUM LASER/STENT PLACEMENT;  Surgeon: Festus Aloe, MD;  Location: [facility];  Service: Urology;   Laterality: Left;  ONLY NEEDS 60 MIN  . LUMBAR DISC SURGERY    . Nodules on Thyroid    . POLYPECTOMY    . PTCA    . SHOULDER ARTHROSCOPY WITH ROTATOR CUFF REPAIR AND SUBACROMIAL DECOMPRESSION Right 01/30/2015   Procedure: RIGHT ARTHROSCOPY SHOULDER SUBACROMIAL DECOMPRESSION DISTAL CLAVICAL RESECTION RETATOR CUFF REPAIR;  Surgeon: Justice Britain, MD;  Location: [facility];  Service: Orthopedics;  Laterality: Right;  . TONSILLECTOMY    . VASECTOMY      There were no vitals filed for this visit.   Subjective Assessment - 10/23/20 1233    Subjective I think my balance has improved a bit.  I've been walking at the park.    Pertinent History In 1964 had disc removed with residual right LE weakness    Diagnostic tests Xray demonstrated lumbar scoliosis and spondylolisthesis stable from a year ago.    Patient Stated Goals Would like to improve balance to be able to walk longer distances, strengthen R calf, be able to get out of a chair, turn and bend forward without fear of falling.    Currently in Pain? No/denies              [facility] PT Assessment - 10/23/20 0001      Dynamic Gait Index   Level Surface Mild  Impairment    Change in Gait Speed Mild Impairment    Gait with Horizontal Head Turns Mild Impairment    Gait with Vertical Head Turns Mild Impairment    Gait and Pivot Turn Mild Impairment    Step Over Obstacle Mild Impairment    Step Around Obstacles Normal    Steps Mild Impairment    Total Score 17    DGI comment: all performed without cane                         [facility] Adult PT Treatment/Exercise - 10/23/20 0001      Neuro Re-ed    Neuro Re-ed Details  dynamic gait with head turns and around obstacles; change of gait spred;  "confident hallway walk mod fast speed"       Knee/Hip Exercises: Stretches   Other Knee/Hip Stretches 2nd step hip flexor stretch with UE movements 10x     Other Knee/Hip Stretches 2nd step stretch for QL overhead reach  5x right/left        Knee/Hip Exercises: Aerobic   Nustep L4 10 min while discussing status/progress       Knee/Hip Exercises: Machines for Strengthening   Total Gym Leg Press Seat 7: BLE 105# x15 reps, 60# single leg 2x10      Knee/Hip Exercises: Standing   Heel Raises Limitations heel and toes 15x    Lateral Step Up Limitations step up and over 4" step with minimal UE support x10 reps    Forward Step Up Limitations step taps 4" alternating without UE support    Wall Squat Limitations counter top push ups 10x     Other Standing Knee Exercises 15# kettlebell modified 1/2 dead lifts 2x 10       Knee/Hip Exercises: Seated   Sit to Sand --   holding 10# kettle bell- taps on chair                   PT Short Term Goals - 10/23/20 1357      PT SHORT TERM GOAL #1   Title Pt will be independent with initial HEP before he travels on Sept 24th    Status Achieved      PT SHORT TERM GOAL #2   Title Pt will improve RLE gastroc/soleus strength to be able to perform 2-3 single leg heel lifts to improve push off during gait    Time 4    Period Weeks    Status On-going      PT SHORT TERM GOAL #3   Title Pt will improve BERG balance score to >/= 45/56    Status Achieved      PT SHORT TERM GOAL #4   Title Pt will safely negotiate curb with cane and supervision x 5 reps with improved foot clearance.    Status Achieved             PT Long Term Goals - 10/23/20 1358      PT LONG TERM GOAL #1   Title Pt will demonstrate independence with final HEP    Time 10    Period Weeks    Status On-going    Target Date 11/09/20      PT LONG TERM GOAL #2   Title Pt will demonstrate ability to perform 5-6 single leg heel lifts on RLE to improve push off for gait    Period Weeks    Status On-going  PT LONG TERM GOAL #3   Title Pt will improve BERG balance score to >/= 50/56 to indicate decreased risk for falls    Time 10    Period Weeks    Status On-going      PT LONG TERM GOAL #4   Title Pt will  negotiate 12 stairs with one rail, alternating sequence without catching either foot on stairs, MOD I    Time 10    Period Weeks    Status On-going      PT LONG TERM GOAL #5   Title Pt will demonstrate ability to stand from low chair (14") without use of UE x 5 reps in </= 12 seconds    Status Achieved                 Plan - 10/23/20 1350    Clinical Impression Statement Dynamic Gait Index score improved from 14/23 to 17/23 today.   Large improvements in gait speed may be limited by LE arthritic issues   He is able to continue with moderate intensity strengthening.  Therapist monitoring response and encouraging pt to take rest breaks when pt becomes short of breath.  He self rates her perceived exertion level at 14/20 towards the end of the session.    Comorbidities allergic rhinitis, CAD s/p CABG in 1999, OA of hip, h/o MI, adenomatous colonic polyps, HLD, lumbar disc disease, R knee meniscus tear, removal of thyroid nodule, lumbar spinal stenosis, shoulder arthroscopy wtih rotator cuff repair and subacromial decompression    Stability/Clinical Decision Making Stable/Uncomplicated    Rehab Potential Good    PT Frequency 2x / week    PT Duration 8 weeks    PT Treatment/Interventions ADLs/Self Care Home Management;Aquatic Therapy;Electrical Stimulation;DME Instruction;Gait training;Stair training;Functional mobility training;Therapeutic activities;Therapeutic exercise;Balance training;Neuromuscular re-education;Patient/family education    PT Next Visit Plan balance, strength, endurance; check single leg heel lift for STG;  may want to try heel lifts on leg press machine;  approaching KX modifier    PT Home Exercise Plan [id number]           Patient will benefit from skilled therapeutic intervention in order to improve the following deficits and impairments:  Abnormal gait, Decreased balance, Decreased strength, Difficulty walking  Visit Diagnosis: Repeated falls  Muscle weakness  (generalized)  Other abnormalities of gait and mobility  Unsteadiness on feet     Problem List Patient Active Problem List   Diagnosis Date Noted  . Dysfunction of Eustachian tube, bilateral 07/21/2020  . Gait disorder 04/20/2016  . Hyperglycemia 04/20/2016  . Abnormal CT scan, chest 06/03/2014  . Erectile dysfunction 06/03/2014  . Spinal stenosis of lumbar region 03/15/2014  . Cough 03/12/2013  . Allergic rhinitis 03/12/2013  . Leg pain, bilateral 12/30/2011  . Bladder neck obstruction 12/30/2011  . Preventative health care 12/25/2011  . Lumbar disc disease 12/25/2011  . S/P removal of thyroid nodule 12/25/2011  . History of MI (myocardial infarction) 12/25/2011  . Right knee meniscal tear 12/25/2011  . Degenerative arthritis of hip 12/25/2011  . HIP PAIN, RIGHT 05/14/2010  . FATIGUE 10/13/2009  . HYPERTENSION, BENIGN 02/25/2009  . CAD, AUTOLOGOUS BYPASS GRAFT 02/25/2009  . CORONARY ARTERY ANEURYSM 02/25/2009  . BACK PAIN 04/15/2008  . Hyperlipidemia 02/13/2008  . CORONARY ARTERY DISEASE 02/13/2008  . DIVERTICULOSIS, COLON 02/13/2008  . CARDIAC DISEASE, HX OF 02/13/2008  . COLONIC POLYPS, HX OF 02/13/2008   Ruben Im, PT 10/23/20 2:13 PM Phone: [phone] Fax: [phone] Alvera Singh 10/23/2020, 2:13  PM  [facility] [address], [place], [place], [address] Phone: [phone]   Fax:  [phone]  Name: Larry [name] MRN: [id number] Date of Birth: Sep 22, 1932

## 2020-10-27 ENCOUNTER — Other Ambulatory Visit: Payer: Self-pay

## 2020-10-27 ENCOUNTER — Ambulatory Visit: Payer: Medicare Other

## 2020-10-27 DIAGNOSIS — R2681 Unsteadiness on feet: Secondary | ICD-10-CM

## 2020-10-27 DIAGNOSIS — R2689 Other abnormalities of gait and mobility: Secondary | ICD-10-CM | POA: Diagnosis not present

## 2020-10-27 DIAGNOSIS — R296 Repeated falls: Secondary | ICD-10-CM | POA: Diagnosis not present

## 2020-10-27 DIAGNOSIS — M6281 Muscle weakness (generalized): Secondary | ICD-10-CM | POA: Diagnosis not present

## 2020-10-27 NOTE — Therapy (Signed)
[facility] [address], [place], [place], [address] Phone: [phone]   Fax:  [phone]  Physical Therapy Treatment  Patient Details  Name: Larry [name] MRN: [id number] Date of Birth: 04-20-1932 Referring Provider (PT): Earleen Newport, MD   Encounter Date: 10/27/2020   PT End of Session - 10/27/20 1315    Visit Number 17    Date for PT Re-Evaluation 11/09/20    Authorization Type Medicare and [employer]; cert period set for 90 days due to pt will be out of town for 10 days from Sept 24th > October 7th.    Progress Note Due on Visit 20    PT Start Time 1230    PT Stop Time 1313    PT Time Calculation (min) 43 min    Activity Tolerance Patient tolerated treatment well    Behavior During Therapy WFL for tasks assessed/performed           Past Medical History:  Diagnosis Date  . Allergic rhinitis, cause unspecified 03/12/2013  . Allergy   . CAD (coronary artery disease)    S/P CABG in 1999   . Cataract   . Decreased exercise tolerance    Exertional fatigue  . Degenerative arthritis of hip   . Diverticulosis of colon   . ED (erectile dysfunction)   . Erectile dysfunction 06/03/2014  . History of colonic polyps   . History of kidney stones   . History of MI (myocardial infarction)   . Hx of adenomatous colonic polyps 2000  . Hyperlipidemia    Low HDL  . Lumbar disc disease    Hx of with recent back pain and buttock pain  . Myocardial infarction ([place]) 1999  . Right knee meniscal tear   . S/P removal of thyroid nodule   . Spinal stenosis of lumbar region 03/15/2014    Past Surgical History:  Procedure Laterality Date  . CARDIAC CATHETERIZATION    . CATARACT EXTRACTION    . COLONOSCOPY    . CORONARY ARTERY BYPASS GRAFT  1999  . CYSTOSCOPY/URETEROSCOPY/HOLMIUM LASER/STENT PLACEMENT Left 12/23/2017   Procedure: CYSTOSCOPY/RETROGRADE/URETEROSCOPY/HOLMIUM LASER/STENT PLACEMENT;  Surgeon: Festus Aloe, MD;  Location: [facility];  Service: Urology;  Laterality: Left;  ONLY NEEDS 60 MIN  . LUMBAR DISC SURGERY    . Nodules on Thyroid    . POLYPECTOMY    . PTCA    . SHOULDER ARTHROSCOPY WITH ROTATOR CUFF REPAIR AND SUBACROMIAL DECOMPRESSION Right 01/30/2015   Procedure: RIGHT ARTHROSCOPY SHOULDER SUBACROMIAL DECOMPRESSION DISTAL CLAVICAL RESECTION RETATOR CUFF REPAIR;  Surgeon: Justice Britain, MD;  Location: [facility];  Service: Orthopedics;  Laterality: Right;  . TONSILLECTOMY    . VASECTOMY      There were no vitals filed for this visit.   Subjective Assessment - 10/27/20 1244    Subjective I didn't walk as much this weekend because I was catching up with stuff at home.    Currently in Pain? No/denies                             [facility] Adult PT Treatment/Exercise - 10/27/20 0001      Knee/Hip Exercises: Stretches   Other Knee/Hip Stretches 2nd step hip flexor stretch with UE movements 10x     Other Knee/Hip Stretches 2nd step stretch for QL overhead reach  5x right/left       Knee/Hip Exercises: Aerobic   Nustep L4 10 min  while discussing status/progress       Knee/Hip Exercises: Machines for Strengthening   Total Gym Leg Press Seat 7: BLE 105# 2x15 reps, 60# single leg 2x10      Knee/Hip Exercises: Standing   Heel Raises Limitations heel and toes 15x    Forward Step Up Limitations step taps 6" alternating without UE support    Wall Squat Limitations counter top push ups 10x     Rocker Board 3 minutes    Other Standing Knee Exercises 15# kettlebell modified 1/2 dead lifts 2x 10       Knee/Hip Exercises: Seated   Sit to Sand 2 sets;10 reps   holding 10# kettlebell with tap on chair                   PT Short Term Goals - 10/27/20 1301      PT SHORT TERM GOAL #2   Title Pt will improve RLE gastroc/soleus strength to be able to perform 2-3 single leg heel lifts to improve push off during gait    Baseline 0 single leg heel raise on Rt, Lt x 5     Time 4    Period Weeks    Status On-going             PT Long Term Goals - 10/23/20 1358      PT LONG TERM GOAL #1   Title Pt will demonstrate independence with final HEP    Time 10    Period Weeks    Status On-going    Target Date 11/09/20      PT LONG TERM GOAL #2   Title Pt will demonstrate ability to perform 5-6 single leg heel lifts on RLE to improve push off for gait    Period Weeks    Status On-going      PT LONG TERM GOAL #3   Title Pt will improve BERG balance score to >/= 50/56 to indicate decreased risk for falls    Time 10    Period Weeks    Status On-going      PT LONG TERM GOAL #4   Title Pt will negotiate 12 stairs with one rail, alternating sequence without catching either foot on stairs, MOD I    Time 10    Period Weeks    Status On-going      PT LONG TERM GOAL #5   Title Pt will demonstrate ability to stand from low chair (14") without use of UE x 5 reps in </= 12 seconds    Status Achieved                 Plan - 10/27/20 1300    Clinical Impression Statement Dynamic Gait Index score improved to 17/23 last week indicating improved stability. Pt remains unable to perform single leg heel lift on the Rt due to significant atrophy >1 year.  Pt continues to walk regularly with minimal use of his cane.   PT monitored session to manage fatigue and provide cueing for technique.  Pt fatigued with sit to stand and dead lift exercise and was encouraged to rest and perform slow, deep breathing.  Pt required reduced UE support with alternating step taps today.  Pt will continue to benefit from skilled PT to address balance, gait and endurance to improve safety and independence at home and in the community.    Comorbidities allergic rhinitis, CAD s/p CABG in 1999, OA of hip, h/o MI, adenomatous colonic polyps, HLD,  lumbar disc disease, R knee meniscus tear, removal of thyroid nodule, lumbar spinal stenosis, shoulder arthroscopy wtih rotator cuff repair and  subacromial decompression    PT Frequency 2x / week    PT Duration 8 weeks    PT Treatment/Interventions ADLs/Self Care Home Management;Aquatic Therapy;Electrical Stimulation;DME Instruction;Gait training;Stair training;Functional mobility training;Therapeutic activities;Therapeutic exercise;Balance training;Neuromuscular re-education;Patient/family education    PT Next Visit Plan balance, strength, endurance;  try heel lifts on leg press machine;  approaching KX modifier    PT Home Exercise Plan [id number]    Consulted and Agree with Plan of Care Patient           Patient will benefit from skilled therapeutic intervention in order to improve the following deficits and impairments:  Abnormal gait, Decreased balance, Decreased strength, Difficulty walking  Visit Diagnosis: Repeated falls  Muscle weakness (generalized)  Other abnormalities of gait and mobility  Unsteadiness on feet     Problem List Patient Active Problem List   Diagnosis Date Noted  . Dysfunction of Eustachian tube, bilateral 07/21/2020  . Gait disorder 04/20/2016  . Hyperglycemia 04/20/2016  . Abnormal CT scan, chest 06/03/2014  . Erectile dysfunction 06/03/2014  . Spinal stenosis of lumbar region 03/15/2014  . Cough 03/12/2013  . Allergic rhinitis 03/12/2013  . Leg pain, bilateral 12/30/2011  . Bladder neck obstruction 12/30/2011  . Preventative health care 12/25/2011  . Lumbar disc disease 12/25/2011  . S/P removal of thyroid nodule 12/25/2011  . History of MI (myocardial infarction) 12/25/2011  . Right knee meniscal tear 12/25/2011  . Degenerative arthritis of hip 12/25/2011  . HIP PAIN, RIGHT 05/14/2010  . FATIGUE 10/13/2009  . HYPERTENSION, BENIGN 02/25/2009  . CAD, AUTOLOGOUS BYPASS GRAFT 02/25/2009  . CORONARY ARTERY ANEURYSM 02/25/2009  . BACK PAIN 04/15/2008  . Hyperlipidemia 02/13/2008  . CORONARY ARTERY DISEASE 02/13/2008  . DIVERTICULOSIS, COLON 02/13/2008  . CARDIAC DISEASE, HX OF  02/13/2008  . COLONIC POLYPS, HX OF 02/13/2008    Sigurd Sos, PT 10/27/20 1:17 PM  [facility] [address] [place], [place], [address] Phone: [phone]   Fax:  [phone]  Name: Larry [name] MRN: [id number] Date of Birth: May 04, 1932

## 2020-10-30 ENCOUNTER — Other Ambulatory Visit: Payer: Self-pay

## 2020-10-30 ENCOUNTER — Encounter: Payer: Self-pay | Admitting: Physical Therapy

## 2020-10-30 ENCOUNTER — Ambulatory Visit: Payer: Medicare Other | Admitting: Physical Therapy

## 2020-10-30 DIAGNOSIS — M6281 Muscle weakness (generalized): Secondary | ICD-10-CM

## 2020-10-30 DIAGNOSIS — R2681 Unsteadiness on feet: Secondary | ICD-10-CM

## 2020-10-30 DIAGNOSIS — R296 Repeated falls: Secondary | ICD-10-CM

## 2020-10-30 DIAGNOSIS — R2689 Other abnormalities of gait and mobility: Secondary | ICD-10-CM

## 2020-10-30 NOTE — Therapy (Signed)
[facility] [address], [place], [place], [address] Phone: [phone]   Fax:  [phone]  Physical Therapy Treatment  Patient Details  Name: Larry [name] MRN: [id number] Date of Birth: December 04, 1932 Referring Provider (PT): Earleen Newport, MD   Encounter Date: 10/30/2020   PT End of Session - 10/30/20 1756    Visit Number 18    Number of Visits 18    Date for PT Re-Evaluation 11/09/20    Authorization Type Medicare and [employer]; cert period set for 90 days due to pt will be out of town for 10 days from Sept 24th > October 7th.    Progress Note Due on Visit 20    PT Start Time 1237    PT Stop Time 1316    PT Time Calculation (min) 39 min    Activity Tolerance Patient tolerated treatment well           Past Medical History:  Diagnosis Date  . Allergic rhinitis, cause unspecified 03/12/2013  . Allergy   . CAD (coronary artery disease)    S/P CABG in 1999   . Cataract   . Decreased exercise tolerance    Exertional fatigue  . Degenerative arthritis of hip   . Diverticulosis of colon   . ED (erectile dysfunction)   . Erectile dysfunction 06/03/2014  . History of colonic polyps   . History of kidney stones   . History of MI (myocardial infarction)   . Hx of adenomatous colonic polyps 2000  . Hyperlipidemia    Low HDL  . Lumbar disc disease    Hx of with recent back pain and buttock pain  . Myocardial infarction ([place]) 1999  . Right knee meniscal tear   . S/P removal of thyroid nodule   . Spinal stenosis of lumbar region 03/15/2014    Past Surgical History:  Procedure Laterality Date  . CARDIAC CATHETERIZATION    . CATARACT EXTRACTION    . COLONOSCOPY    . CORONARY ARTERY BYPASS GRAFT  1999  . CYSTOSCOPY/URETEROSCOPY/HOLMIUM LASER/STENT PLACEMENT Left 12/23/2017   Procedure: CYSTOSCOPY/RETROGRADE/URETEROSCOPY/HOLMIUM LASER/STENT PLACEMENT;  Surgeon: Festus Aloe, MD;  Location: [facility];   Service: Urology;  Laterality: Left;  ONLY NEEDS 60 MIN  . LUMBAR DISC SURGERY    . Nodules on Thyroid    . POLYPECTOMY    . PTCA    . SHOULDER ARTHROSCOPY WITH ROTATOR CUFF REPAIR AND SUBACROMIAL DECOMPRESSION Right 01/30/2015   Procedure: RIGHT ARTHROSCOPY SHOULDER SUBACROMIAL DECOMPRESSION DISTAL CLAVICAL RESECTION RETATOR CUFF REPAIR;  Surgeon: Justice Britain, MD;  Location: [facility];  Service: Orthopedics;  Laterality: Right;  . TONSILLECTOMY    . VASECTOMY      There were no vitals filed for this visit.   Subjective Assessment - 10/30/20 1248    Subjective I got a late start today.   I walked at the park yesterday.    Pertinent History In 1964 had disc removed with residual right LE weakness    Diagnostic tests Xray demonstrated lumbar scoliosis and spondylolisthesis stable from a year ago.    Patient Stated Goals Would like to improve balance to be able to walk longer distances, strengthen R calf, be able to get out of a chair, turn and bend forward without fear of falling.    Currently in Pain? No/denies  [facility] PT Treatment/Exercise - 10/30/20 0001      Therapeutic Activites    Therapeutic Activities ADL's;Lifting    ADL's sit to stand; stairs, lifting, carrying     Lifting 15# kettle bell to shin level 10x       Neuro Re-ed    Neuro Re-ed Details  standing on black foam with head movements, UE movements; staggered standing, weight shifting       Knee/Hip Exercises: Stretches   Active Hamstring Stretch Right;Left;5 reps    Active Hamstring Stretch Limitations 2nd step    Other Knee/Hip Stretches 2nd step hip flexor stretch with UE movements 10x     Other Knee/Hip Stretches doorway QL stretch 5x right/left       Knee/Hip Exercises: Aerobic   Nustep L4 10 min while discussing status/progress       Knee/Hip Exercises: Machines for Strengthening   Total Gym Leg Press Seat 7: BLE 105# 2x15 reps, 60# single leg 2x10       Knee/Hip Exercises: Standing   Heel Raises Limitations --    Forward Step Up Limitations step taps 6" alternating without UE support    Wall Squat Limitations counter top push ups 10x     Rocker Board --    Other Standing Knee Exercises 15# kettlebell modified 1/2 dead lifts 2x 10     Other Standing Knee Exercises wall climbs 10x       Knee/Hip Exercises: Seated   Other Seated Knee/Hip Exercises seated heel lift 20# 10x, 15# 10x right only     Sit to Sand 1 set;10 reps   holding 10# kettlebell with tap on chair                   PT Short Term Goals - 10/27/20 1301      PT SHORT TERM GOAL #2   Title Pt will improve RLE gastroc/soleus strength to be able to perform 2-3 single leg heel lifts to improve push off during gait    Baseline 0 single leg heel raise on Rt, Lt x 5    Time 4    Period Weeks    Status On-going             PT Long Term Goals - 10/23/20 1358      PT LONG TERM GOAL #1   Title Pt will demonstrate independence with final HEP    Time 10    Period Weeks    Status On-going    Target Date 11/09/20      PT LONG TERM GOAL #2   Title Pt will demonstrate ability to perform 5-6 single leg heel lifts on RLE to improve push off for gait    Period Weeks    Status On-going      PT LONG TERM GOAL #3   Title Pt will improve BERG balance score to >/= 50/56 to indicate decreased risk for falls    Time 10    Period Weeks    Status On-going      PT LONG TERM GOAL #4   Title Pt will negotiate 12 stairs with one rail, alternating sequence without catching either foot on stairs, MOD I    Time 10    Period Weeks    Status On-going      PT LONG TERM GOAL #5   Title Pt will demonstrate ability to stand from low chair (14") without use of UE x 5 reps in </= 12 seconds  Status Achieved                 Plan - 10/30/20 1756    Clinical Impression Statement Improving dynamic balance overall but quite challenged with staggered standing with right foot  front secondary to long term right ankle/foot weakness.   No loss of balance with shoulder width even stance on foam with head and UE movements.  He is highly compliant with his HEP and regularly walking program.  Therapist providing close supervision at [facility] at times for safety.    Comorbidities allergic rhinitis, CAD s/p CABG in 1999, OA of hip, h/o MI, adenomatous colonic polyps, HLD, lumbar disc disease, R knee meniscus tear, removal of thyroid nodule, lumbar spinal stenosis, shoulder arthroscopy wtih rotator cuff repair and subacromial decompression    Rehab Potential Good    PT Frequency 2x / week    PT Duration 8 weeks    PT Treatment/Interventions ADLs/Self Care Home Management;Aquatic Therapy;Electrical Stimulation;DME Instruction;Gait training;Stair training;Functional mobility training;Therapeutic activities;Therapeutic exercise;Balance training;Neuromuscular re-education;Patient/family education    PT Next Visit Plan balance, strength, endurance;  try heel lifts on leg press machine;  approaching KX modifier    PT Home Exercise Plan [id number]           Patient will benefit from skilled therapeutic intervention in order to improve the following deficits and impairments:  Abnormal gait, Decreased balance, Decreased strength, Difficulty walking  Visit Diagnosis: Repeated falls  Muscle weakness (generalized)  Other abnormalities of gait and mobility  Unsteadiness on feet     Problem List Patient Active Problem List   Diagnosis Date Noted  . Dysfunction of Eustachian tube, bilateral 07/21/2020  . Gait disorder 04/20/2016  . Hyperglycemia 04/20/2016  . Abnormal CT scan, chest 06/03/2014  . Erectile dysfunction 06/03/2014  . Spinal stenosis of lumbar region 03/15/2014  . Cough 03/12/2013  . Allergic rhinitis 03/12/2013  . Leg pain, bilateral 12/30/2011  . Bladder neck obstruction 12/30/2011  . Preventative health care 12/25/2011  . Lumbar disc disease 12/25/2011  . S/P  removal of thyroid nodule 12/25/2011  . History of MI (myocardial infarction) 12/25/2011  . Right knee meniscal tear 12/25/2011  . Degenerative arthritis of hip 12/25/2011  . HIP PAIN, RIGHT 05/14/2010  . FATIGUE 10/13/2009  . HYPERTENSION, BENIGN 02/25/2009  . CAD, AUTOLOGOUS BYPASS GRAFT 02/25/2009  . CORONARY ARTERY ANEURYSM 02/25/2009  . BACK PAIN 04/15/2008  . Hyperlipidemia 02/13/2008  . CORONARY ARTERY DISEASE 02/13/2008  . DIVERTICULOSIS, COLON 02/13/2008  . CARDIAC DISEASE, HX OF 02/13/2008  . COLONIC POLYPS, HX OF 02/13/2008   Ruben Im, PT 10/30/20 7:08 PM Phone: [phone] Fax: [phone] Alvera Singh 10/30/2020, 7:07 PM   [facility] [address] [place], [place], [address] Phone: [phone]   Fax:  [phone]  Name: Jarrell Armond MRN: [id number] Date of Birth: [birth year]

## 2020-11-19 ENCOUNTER — Ambulatory Visit: Payer: Medicare Other | Attending: Neurological Surgery

## 2020-11-19 ENCOUNTER — Other Ambulatory Visit: Payer: Self-pay

## 2020-11-19 DIAGNOSIS — R2689 Other abnormalities of gait and mobility: Secondary | ICD-10-CM

## 2020-11-19 DIAGNOSIS — C44329 Squamous cell carcinoma of skin of other parts of face: Secondary | ICD-10-CM | POA: Diagnosis not present

## 2020-11-19 DIAGNOSIS — L570 Actinic keratosis: Secondary | ICD-10-CM | POA: Diagnosis not present

## 2020-11-19 DIAGNOSIS — R296 Repeated falls: Secondary | ICD-10-CM | POA: Diagnosis not present

## 2020-11-19 DIAGNOSIS — D485 Neoplasm of uncertain behavior of skin: Secondary | ICD-10-CM | POA: Diagnosis not present

## 2020-11-19 DIAGNOSIS — R2681 Unsteadiness on feet: Secondary | ICD-10-CM | POA: Diagnosis not present

## 2020-11-19 DIAGNOSIS — M6281 Muscle weakness (generalized): Secondary | ICD-10-CM | POA: Diagnosis not present

## 2020-11-19 DIAGNOSIS — L219 Seborrheic dermatitis, unspecified: Secondary | ICD-10-CM | POA: Diagnosis not present

## 2020-11-19 NOTE — Therapy (Signed)
[facility] [address], [place], [place], [address] Phone: [phone]   Fax:  [phone]  Physical Therapy Treatment  Patient Details  Name: Larry [name] MRN: [id number] Date of Birth: 1932-10-30 Referring Provider (PT): Earleen Newport, MD   Encounter Date: 11/19/2020   PT End of Session - 11/19/20 1614    Visit Number 19    Date for PT Re-Evaluation 01/28/20    Authorization Type Medicare and [employer]    Progress Note Due on Visit 20    PT Start Time 1531    PT Stop Time 1618    PT Time Calculation (min) 47 min    Activity Tolerance Patient tolerated treatment well    Behavior During Therapy [facility] for tasks assessed/performed           Past Medical History:  Diagnosis Date  . Allergic rhinitis, cause unspecified 03/12/2013  . Allergy   . CAD (coronary artery disease)    S/P CABG in 1999   . Cataract   . Decreased exercise tolerance    Exertional fatigue  . Degenerative arthritis of hip   . Diverticulosis of colon   . ED (erectile dysfunction)   . Erectile dysfunction 06/03/2014  . History of colonic polyps   . History of kidney stones   . History of MI (myocardial infarction)   . Hx of adenomatous colonic polyps 2000  . Hyperlipidemia    Low HDL  . Lumbar disc disease    Hx of with recent back pain and buttock pain  . Myocardial infarction ([place]) 1999  . Right knee meniscal tear   . S/P removal of thyroid nodule   . Spinal stenosis of lumbar region 03/15/2014    Past Surgical History:  Procedure Laterality Date  . CARDIAC CATHETERIZATION    . CATARACT EXTRACTION    . COLONOSCOPY    . CORONARY ARTERY BYPASS GRAFT  1999  . CYSTOSCOPY/URETEROSCOPY/HOLMIUM LASER/STENT PLACEMENT Left 12/23/2017   Procedure: CYSTOSCOPY/RETROGRADE/URETEROSCOPY/HOLMIUM LASER/STENT PLACEMENT;  Surgeon: Festus Aloe, MD;  Location: [facility];  Service: Urology;  Laterality: Left;  ONLY NEEDS 60 MIN  . LUMBAR  DISC SURGERY    . Nodules on Thyroid    . POLYPECTOMY    . PTCA    . SHOULDER ARTHROSCOPY WITH ROTATOR CUFF REPAIR AND SUBACROMIAL DECOMPRESSION Right 01/30/2015   Procedure: RIGHT ARTHROSCOPY SHOULDER SUBACROMIAL DECOMPRESSION DISTAL CLAVICAL RESECTION RETATOR CUFF REPAIR;  Surgeon: Justice Britain, MD;  Location: [facility];  Service: Orthopedics;  Laterality: Right;  . TONSILLECTOMY    . VASECTOMY      There were no vitals filed for this visit.       [facility] PT Assessment - 11/19/20 0001      Assessment   Medical Diagnosis Balance impairments, fall    Referring Provider (PT) Earleen Newport, MD    Onset Date/Surgical Date 05/30/20      Cognition   Overall Cognitive Status Within Functional Limits for tasks assessed      Transfers   Five time sit to stand comments  10.47 seconds      Berg Balance Test   Sit to Stand Able to stand without using hands and stabilize independently    Standing Unsupported Able to stand safely 2 minutes    Sitting with Back Unsupported but Feet Supported on Floor or Stool Able to sit safely and securely 2 minutes    Stand to Sit Sits safely with minimal use of hands  Transfers Able to transfer safely, minor use of hands    Standing Unsupported with Eyes Closed Able to stand 10 seconds with supervision    Standing Unsupported with Feet Together Able to place feet together independently and stand 1 minute safely    From Standing, Reach Forward with Outstretched Arm Can reach forward >12 cm safely (5")    From Standing Position, Pick up Object from Floor Able to pick up shoe safely and easily    From Standing Position, Turn to Look Behind Over each Shoulder Looks behind from both sides and weight shifts well    Turn 360 Degrees Able to turn 360 degrees safely one side only in 4 seconds or less    Standing Unsupported, Alternately Place Feet on Step/Stool Able to complete 4 steps without aid or supervision    Standing Unsupported, One Foot in [place] to  plae foot ahead of the other independently and hold 30 seconds   needs supervision   Standing on One Leg Able to lift leg independently and hold 5-10 seconds    Total Score 49    Berg comment: significant to high falls risk      Dynamic Gait Index   DGI comment: 69- when performed 10/23/20                         [facility] Adult PT Treatment/Exercise - 11/19/20 0001      Therapeutic Activites    ADL's sit to stand; stairs, lifting, carrying     Lifting 15# kettle bell to shin level 10x       Knee/Hip Exercises: Stretches   Active Hamstring Stretch Right;Left;5 reps    Active Hamstring Stretch Limitations 2nd step    Other Knee/Hip Stretches 2nd step hip flexor stretch with UE movements 10x       Knee/Hip Exercises: Aerobic   Nustep L4 10 min while discussing status/progress       Knee/Hip Exercises: Machines for Strengthening   Total Gym Leg Press Seat 7: BLE 105# 2x15 reps, 60# single leg 2x10      Knee/Hip Exercises: Standing   Forward Step Up Limitations step taps 6" alternating without UE support    Other Standing Knee Exercises 15# kettlebell modified 1/2 dead lifts 2x 10       Knee/Hip Exercises: Seated   Other Seated Knee/Hip Exercises seated heel lift 20# 10x, 15# 10x right only                     PT Short Term Goals - 10/27/20 1301      PT SHORT TERM GOAL #2   Title Pt will improve RLE gastroc/soleus strength to be able to perform 2-3 single leg heel lifts to improve push off during gait    Baseline 0 single leg heel raise on Rt, Lt x 5    Time 4    Period Weeks    Status On-going             PT Long Term Goals - 11/19/20 1541      PT LONG TERM GOAL #1   Title Pt will demonstrate independence with final HEP    Time 8    Period Weeks    Status On-going    Target Date 01/27/21      PT LONG TERM GOAL #2   Title Pt will demonstrate ability to perform 5-6 single leg heel lifts on RLE to improve push off  for gait    Baseline Pt not  able to do this due to chronic Rt ankle weakness    Status Deferred      PT LONG TERM GOAL #3   Title Pt will improve BERG balance score to >/= 51/56 to indicate decreased risk for falls    Baseline 49/56-significant to high falls risk    Time 8    Period Weeks    Status On-going    Target Date 01/28/20      PT LONG TERM GOAL #4   Title Pt will negotiate 12 stairs with one rail, alternating sequence without catching either foot on stairs, MOD I    Baseline able to perform with step-over-step: 1-2 episodes of catching foot but not frequent    Time 8    Period Weeks    Status On-going    Target Date 01/28/20      PT LONG TERM GOAL #5   Title Pt will demonstrate ability to stand from low chair (14") without use of UE x 5 reps in </= 12 seconds    Baseline 10.47 seconds from mat table- no 14" chairs here    Status Achieved      Additional Long Term Goals   Additional Long Term Goals Yes      PT LONG TERM GOAL #6   Title improve strength, endurance and stability to safely walk 15 minutes at the park without need to sit and rest    Baseline 10 minutes now    Time 8    Period Weeks    Status New    Target Date 01/27/21                 Plan - 11/19/20 1559    Clinical Impression Statement Pt demonstrates improving dynamic balance overall with improved Berg and 5x sit to stand. Berg along with most recent DGI continue to indicate a falls risk and warrants further skilled PT to address.  Gait on level surface with shortened step length and inconsistent heel strike without cane in the clinic today. Pt is limited to walking 10 minutes max due to fatigue and instability with longer distances. Pt is highly compliant with his HEP and regularly walking program.   Therapist providing close supervision at [facility] at times for safety.    Comorbidities allergic rhinitis, CAD s/p CABG in 1999, OA of hip, h/o MI, adenomatous colonic polyps, HLD, lumbar disc disease, R knee meniscus tear, removal  of thyroid nodule, lumbar spinal stenosis, shoulder arthroscopy wtih rotator cuff repair and subacromial decompression    PT Frequency 2x / week    PT Duration 8 weeks    PT Treatment/Interventions ADLs/Self Care Home Management;Aquatic Therapy;Electrical Stimulation;DME Instruction;Gait training;Stair training;Functional mobility training;Therapeutic activities;Therapeutic exercise;Balance training;Neuromuscular re-education;Patient/family education    PT Next Visit Plan balance, strength, endurance, approaching KX modifier.  20th visit summary next    PT Home Exercise Plan [id number]    Recommended Other Services recert sent 16/1/09           Patient will benefit from skilled therapeutic intervention in order to improve the following deficits and impairments:  Abnormal gait, Decreased balance, Decreased strength, Difficulty walking  Visit Diagnosis: Repeated falls - Plan: PT plan of care cert/re-cert  Muscle weakness (generalized) - Plan: PT plan of care cert/re-cert  Other abnormalities of gait and mobility - Plan: PT plan of care cert/re-cert  Unsteadiness on feet - Plan: PT plan of care cert/re-cert     Problem List  Patient Active Problem List   Diagnosis Date Noted  . Dysfunction of Eustachian tube, bilateral 07/21/2020  . Gait disorder 04/20/2016  . Hyperglycemia 04/20/2016  . Abnormal CT scan, chest 06/03/2014  . Erectile dysfunction 06/03/2014  . Spinal stenosis of lumbar region 03/15/2014  . Cough 03/12/2013  . Allergic rhinitis 03/12/2013  . Leg pain, bilateral 12/30/2011  . Bladder neck obstruction 12/30/2011  . Preventative health care 12/25/2011  . Lumbar disc disease 12/25/2011  . S/P removal of thyroid nodule 12/25/2011  . History of MI (myocardial infarction) 12/25/2011  . Right knee meniscal tear 12/25/2011  . Degenerative arthritis of hip 12/25/2011  . HIP PAIN, RIGHT 05/14/2010  . FATIGUE 10/13/2009  . HYPERTENSION, BENIGN 02/25/2009  . CAD,  AUTOLOGOUS BYPASS GRAFT 02/25/2009  . CORONARY ARTERY ANEURYSM 02/25/2009  . BACK PAIN 04/15/2008  . Hyperlipidemia 02/13/2008  . CORONARY ARTERY DISEASE 02/13/2008  . DIVERTICULOSIS, COLON 02/13/2008  . CARDIAC DISEASE, HX OF 02/13/2008  . COLONIC POLYPS, HX OF 02/13/2008     Sigurd Sos, PT 11/19/20 4:23 PM  [facility] [address] [place], [place], [address] Phone: [phone]   Fax:  [phone]  Name: Larry [name] MRN: [id number] Date of Birth: [birth year]

## 2020-11-26 ENCOUNTER — Other Ambulatory Visit: Payer: Self-pay

## 2020-11-26 ENCOUNTER — Ambulatory Visit: Payer: Medicare Other

## 2020-11-26 DIAGNOSIS — M6281 Muscle weakness (generalized): Secondary | ICD-10-CM

## 2020-11-26 DIAGNOSIS — R2681 Unsteadiness on feet: Secondary | ICD-10-CM | POA: Diagnosis not present

## 2020-11-26 DIAGNOSIS — R296 Repeated falls: Secondary | ICD-10-CM | POA: Diagnosis not present

## 2020-11-26 DIAGNOSIS — R2689 Other abnormalities of gait and mobility: Secondary | ICD-10-CM | POA: Diagnosis not present

## 2020-11-26 NOTE — Therapy (Signed)
[facility] [address] [place], [place], [address] Phone: [phone]   Fax:  [phone]  Physical Therapy Treatment  Patient Details  Name: Larry [name] MRN: [id number] Date of Birth: 03-04-1932 Referring Provider (PT): Earleen Newport, MD   Encounter Date: 11/26/2020 Progress Note Reporting Period 10/07/20 to 11/26/20  See note below for Objective Data and Assessment of Progress/Goals.       PT End of Session - 11/26/20 1434    Visit Number 20    Date for PT Re-Evaluation 01/28/20    Authorization Type Medicare and [employer]    Progress Note Due on Visit 30    PT Start Time 1401    PT Stop Time 1445    PT Time Calculation (min) 44 min    Activity Tolerance Patient tolerated treatment well    Behavior During Therapy WFL for tasks assessed/performed           Past Medical History:  Diagnosis Date  . Allergic rhinitis, cause unspecified 03/12/2013  . Allergy   . CAD (coronary artery disease)    S/P CABG in 1999   . Cataract   . Decreased exercise tolerance    Exertional fatigue  . Degenerative arthritis of hip   . Diverticulosis of colon   . ED (erectile dysfunction)   . Erectile dysfunction 06/03/2014  . History of colonic polyps   . History of kidney stones   . History of MI (myocardial infarction)   . Hx of adenomatous colonic polyps 2000  . Hyperlipidemia    Low HDL  . Lumbar disc disease    Hx of with recent back pain and buttock pain  . Myocardial infarction ([place]) 1999  . Right knee meniscal tear   . S/P removal of thyroid nodule   . Spinal stenosis of lumbar region 03/15/2014    Past Surgical History:  Procedure Laterality Date  . CARDIAC CATHETERIZATION    . CATARACT EXTRACTION    . COLONOSCOPY    . CORONARY ARTERY BYPASS GRAFT  1999  . CYSTOSCOPY/URETEROSCOPY/HOLMIUM LASER/STENT PLACEMENT Left 12/23/2017   Procedure: CYSTOSCOPY/RETROGRADE/URETEROSCOPY/HOLMIUM LASER/STENT  PLACEMENT;  Surgeon: Festus Aloe, MD;  Location: [facility];  Service: Urology;  Laterality: Left;  ONLY NEEDS 60 MIN  . LUMBAR DISC SURGERY    . Nodules on Thyroid    . POLYPECTOMY    . PTCA    . SHOULDER ARTHROSCOPY WITH ROTATOR CUFF REPAIR AND SUBACROMIAL DECOMPRESSION Right 01/30/2015   Procedure: RIGHT ARTHROSCOPY SHOULDER SUBACROMIAL DECOMPRESSION DISTAL CLAVICAL RESECTION RETATOR CUFF REPAIR;  Surgeon: Justice Britain, MD;  Location: [place];  Service: Orthopedics;  Laterality: Right;  . TONSILLECTOMY    . VASECTOMY      There were no vitals filed for this visit.   Subjective Assessment - 11/26/20 1401    Subjective I walk without the cane at the park.    Currently in Pain? No/denies              [facility] PT Assessment - 11/26/20 0001      Assessment   Medical Diagnosis Balance impairments, fall    Referring Provider (PT) Earleen Newport, MD    Onset Date/Surgical Date 05/30/20      Prior Function   Level of Independence Independent      Cognition   Overall Cognitive Status Within Functional Limits for tasks assessed      [employer] comment: 49/56-significant to high falls risk  Dynamic Gait Index   DGI comment: 46- when performed 10/23/20                         [facility] Adult PT Treatment/Exercise - 11/26/20 0001      Transfers   Five time sit to stand comments  10.47 seconds      High Level Balance   High Level Balance Activities Side stepping;Tandem walking      Therapeutic Activites    ADL's sit to stand; stairs, lifting, carrying     Lifting 15# kettle bell to shin level 20x       Knee/Hip Exercises: Stretches   Active Hamstring Stretch Right;Left;5 reps    Active Hamstring Stretch Limitations 2nd step      Knee/Hip Exercises: Aerobic   Nustep L4 10 min while discussing status/progress       Knee/Hip Exercises: Machines for Strengthening   Total Gym Leg Press Seat 7: BLE 105# 2x15 reps, 60# single leg 2x10       Knee/Hip Exercises: Standing   Forward Step Up Limitations step taps 6" alternating without UE support    Other Standing Knee Exercises 15# kettlebell modified 1/2 dead lifts 2x 10       Knee/Hip Exercises: Seated   Other Seated Knee/Hip Exercises seated heel lift 15# 2x10 bil each               Balance Exercises - 11/26/20 0001      Balance Exercises: Standing   Step Ups 6 inch;UE support 1;Forward   opposite LE skipping a step    Tandem Gait Upper extremity support;4 reps    Sidestepping Upper extremity support;4 reps               PT Short Term Goals - 10/27/20 1301      PT SHORT TERM GOAL #2   Title Pt will improve RLE gastroc/soleus strength to be able to perform 2-3 single leg heel lifts to improve push off during gait    Baseline 0 single leg heel raise on Rt, Lt x 5    Time 4    Period Weeks    Status On-going             PT Long Term Goals - 11/26/20 1407      PT LONG TERM GOAL #1   Title Pt will demonstrate independence with final HEP    Time 8    Period Weeks    Status On-going      PT LONG TERM GOAL #2   Title Pt will demonstrate ability to perform 5-6 single leg heel lifts on RLE to improve push off for gait    Baseline Pt not able to do this due to chronic Rt ankle weakness    Status Deferred      PT LONG TERM GOAL #3   Title Pt will improve BERG balance score to >/= 51/56 to indicate decreased risk for falls    Baseline 49/56-significant to high falls risk    Status On-going      PT LONG TERM GOAL #4   Title Pt will negotiate 12 stairs with one rail, alternating sequence without catching either foot on stairs, MOD I    Baseline able to perform with step-over-step: 1-2 episodes of catching foot but not frequent    Status On-going      PT LONG TERM GOAL #5   Title Pt will demonstrate ability to stand from low  chair (14") without use of UE x 5 reps in </= 12 seconds    Baseline 10.47 seconds from mat table- no 14" chairs here    Status  Achieved      PT LONG TERM GOAL #6   Title improve strength, endurance and stability to safely walk 15 minutes at the park without need to sit and rest    Baseline 10 minutes now    Status On-going                 Plan - 11/26/20 1412    Clinical Impression Statement Pt demonstrates improving dynamic balance overall with improved Berg and 5x sit to stand last week. Berg along with most recent DGI continue to indicate a falls risk and warrants further skilled PT to address.  Gait on level surface with shortened step length and inconsistent heel strike without cane in the clinic today. Pt is limited to walking 10 minutes max due to fatigue and instability with longer distances. Pt is highly compliant with his HEP and regularly walking program.   Therapist providing close supervision at [facility] at times for safety.  Pt will benefit from skilled PT to improve gait, strength, balance and endurance to maximize safety at home and in the community.    PT Treatment/Interventions ADLs/Self Care Home Management;Aquatic Therapy;Electrical Stimulation;DME Instruction;Gait training;Stair training;Functional mobility training;Therapeutic activities;Therapeutic exercise;Balance training;Neuromuscular re-education;Patient/family education    PT Next Visit Plan balance, strength, endurance, start KX now    PT Home Exercise Plan [id number]    Recommended Other Services initial cert is signed    Consulted and Agree with Plan of Care Patient           Patient will benefit from skilled therapeutic intervention in order to improve the following deficits and impairments:  Abnormal gait, Decreased balance, Decreased strength, Difficulty walking  Visit Diagnosis: Repeated falls  Muscle weakness (generalized)  Other abnormalities of gait and mobility  Unsteadiness on feet     Problem List Patient Active Problem List   Diagnosis Date Noted  . Dysfunction of Eustachian tube, bilateral 07/21/2020  . Gait  disorder 04/20/2016  . Hyperglycemia 04/20/2016  . Abnormal CT scan, chest 06/03/2014  . Erectile dysfunction 06/03/2014  . Spinal stenosis of lumbar region 03/15/2014  . Cough 03/12/2013  . Allergic rhinitis 03/12/2013  . Leg pain, bilateral 12/30/2011  . Bladder neck obstruction 12/30/2011  . Preventative health care 12/25/2011  . Lumbar disc disease 12/25/2011  . S/P removal of thyroid nodule 12/25/2011  . History of MI (myocardial infarction) 12/25/2011  . Right knee meniscal tear 12/25/2011  . Degenerative arthritis of hip 12/25/2011  . HIP PAIN, RIGHT 05/14/2010  . FATIGUE 10/13/2009  . HYPERTENSION, BENIGN 02/25/2009  . CAD, AUTOLOGOUS BYPASS GRAFT 02/25/2009  . CORONARY ARTERY ANEURYSM 02/25/2009  . BACK PAIN 04/15/2008  . Hyperlipidemia 02/13/2008  . CORONARY ARTERY DISEASE 02/13/2008  . DIVERTICULOSIS, COLON 02/13/2008  . CARDIAC DISEASE, HX OF 02/13/2008  . COLONIC POLYPS, HX OF 02/13/2008     Sigurd Sos, PT 11/26/20 2:38 PM  [facility] [address], [place], [place], [address] Phone: [phone]   Fax:  [phone]  Name: Kerin Cecchi MRN: [id number] Date of Birth: February 18, 1932

## 2020-12-01 ENCOUNTER — Other Ambulatory Visit: Payer: Self-pay

## 2020-12-01 ENCOUNTER — Ambulatory Visit: Payer: Medicare Other

## 2020-12-01 DIAGNOSIS — R2681 Unsteadiness on feet: Secondary | ICD-10-CM

## 2020-12-01 DIAGNOSIS — R296 Repeated falls: Secondary | ICD-10-CM | POA: Diagnosis not present

## 2020-12-01 DIAGNOSIS — R2689 Other abnormalities of gait and mobility: Secondary | ICD-10-CM | POA: Diagnosis not present

## 2020-12-01 DIAGNOSIS — M6281 Muscle weakness (generalized): Secondary | ICD-10-CM

## 2020-12-01 NOTE — Therapy (Signed)
[facility] [address] [place], [place], [address] Phone: [phone]   Fax:  [phone]  Physical Therapy Treatment  Patient Details  Name: Larry [name] MRN: [id number] Date of Birth: 22-Jun-1932 Referring Provider (PT): Earleen Newport, MD   Encounter Date: 12/01/2020   PT End of Session - 12/01/20 1609    Visit Number 21    Date for PT Re-Evaluation 01/28/20    Authorization Type Medicare and [employer]    Progress Note Due on Visit 30    PT Start Time 1530    PT Stop Time 1610    PT Time Calculation (min) 40 min    Activity Tolerance Patient tolerated treatment well    Behavior During Therapy [facility] for tasks assessed/performed           Past Medical History:  Diagnosis Date  . Allergic rhinitis, cause unspecified 03/12/2013  . Allergy   . CAD (coronary artery disease)    S/P CABG in 1999   . Cataract   . Decreased exercise tolerance    Exertional fatigue  . Degenerative arthritis of hip   . Diverticulosis of colon   . ED (erectile dysfunction)   . Erectile dysfunction 06/03/2014  . History of colonic polyps   . History of kidney stones   . History of MI (myocardial infarction)   . Hx of adenomatous colonic polyps 2000  . Hyperlipidemia    Low HDL  . Lumbar disc disease    Hx of with recent back pain and buttock pain  . Myocardial infarction ([place]) 1999  . Right knee meniscal tear   . S/P removal of thyroid nodule   . Spinal stenosis of lumbar region 03/15/2014    Past Surgical History:  Procedure Laterality Date  . CARDIAC CATHETERIZATION    . CATARACT EXTRACTION    . COLONOSCOPY    . CORONARY ARTERY BYPASS GRAFT  1999  . CYSTOSCOPY/URETEROSCOPY/HOLMIUM LASER/STENT PLACEMENT Left 12/23/2017   Procedure: CYSTOSCOPY/RETROGRADE/URETEROSCOPY/HOLMIUM LASER/STENT PLACEMENT;  Surgeon: Festus Aloe, MD;  Location[facility];  Service: Urology;  Laterality: Left;  ONLY NEEDS 60 MIN  . LUMBAR  DISC SURGERY    . Nodules on Thyroid    . POLYPECTOMY    . PTCA    . SHOULDER ARTHROSCOPY WITH ROTATOR CUFF REPAIR AND SUBACROMIAL DECOMPRESSION Right 01/30/2015   Procedure: RIGHT ARTHROSCOPY SHOULDER SUBACROMIAL DECOMPRESSION DISTAL CLAVICAL RESECTION RETATOR CUFF REPAIR;  Surgeon: Justice Britain, MD;  Location: [facility];  Service: Orthopedics;  Laterality: Right;  . TONSILLECTOMY    . VASECTOMY      There were no vitals filed for this visit.   Subjective Assessment - 12/01/20 1537    Subjective I have been walking and exercising.    Patient Stated Goals Would like to improve balance to be able to walk longer distances, strengthen R calf, be able to get out of a chair, turn and bend forward without fear of falling.    Currently in Pain? No/denies                             [facility] Adult PT Treatment/Exercise - 12/01/20 0001      High Level Balance   High Level Balance Activities Side stepping;Tandem walking      Therapeutic Activites    ADL's sit to stand; stairs, lifting, carrying     Lifting 15# kettle bell to shin level 20x  Knee/Hip Exercises: Stretches   Active Hamstring Stretch Right;Left;5 reps    Active Hamstring Stretch Limitations 2nd step      Knee/Hip Exercises: Aerobic   Nustep L4 10 min while discussing status/progress       Knee/Hip Exercises: Machines for Strengthening   Total Gym Leg Press Seat 7: BLE 105# 2x15 reps, 60# single leg 2x10      Knee/Hip Exercises: Standing   Forward Step Up Limitations step taps 6" alternating without UE support    Walking with Sports Cord 15# forward and reverse x10    Other Standing Knee Exercises 15# kettlebell modified 1/2 dead lifts 2x 10       Knee/Hip Exercises: Seated   Other Seated Knee/Hip Exercises seated heel lift 15# 2x10 bil each               Balance Exercises - 12/01/20 0001      Balance Exercises: Standing   Tandem Gait 5 reps;Limitations   gait belt and min assist for safety.    Sidestepping 4 reps;Limitations    Sidestepping Limitations gait belt and min Assist by PT for safety               PT Short Term Goals - 10/27/20 1301      PT SHORT TERM GOAL #2   Title Pt will improve RLE gastroc/soleus strength to be able to perform 2-3 single leg heel lifts to improve push off during gait    Baseline 0 single leg heel raise on Rt, Lt x 5    Time 4    Period Weeks    Status On-going             PT Long Term Goals - 11/26/20 1407      PT LONG TERM GOAL #1   Title Pt will demonstrate independence with final HEP    Time 8    Period Weeks    Status On-going      PT LONG TERM GOAL #2   Title Pt will demonstrate ability to perform 5-6 single leg heel lifts on RLE to improve push off for gait    Baseline Pt not able to do this due to chronic Rt ankle weakness    Status Deferred      PT LONG TERM GOAL #3   Title Pt will improve BERG balance score to >/= 51/56 to indicate decreased risk for falls    Baseline 49/56-significant to high falls risk    Status On-going      PT LONG TERM GOAL #4   Title Pt will negotiate 12 stairs with one rail, alternating sequence without catching either foot on stairs, MOD I    Baseline able to perform with step-over-step: 1-2 episodes of catching foot but not frequent    Status On-going      PT LONG TERM GOAL #5   Title Pt will demonstrate ability to stand from low chair (14") without use of UE x 5 reps in </= 12 seconds    Baseline 10.47 seconds from mat table- no 14" chairs here    Status Achieved      PT LONG TERM GOAL #6   Title improve strength, endurance and stability to safely walk 15 minutes at the park without need to sit and rest    Baseline 10 minutes now    Status On-going                 Plan - 12/01/20 1611  Clinical Impression Statement Pt demonstrates improving dynamic balance overall with improved stability with gait in the community. Gait on level surface with shortened step length and  inconsistent heel strike without cane in the clinic today. Pt is limited to walking 10 minutes max due to fatigue and instability with longer distances. Pt is highly compliant with his HEP and regularly walking program.   Therapist providing close supervision at [facility] at times for safety.  Pt will benefit from skilled PT to improve gait, strength, balance and endurance to maximize safety at home and in the community.    Comorbidities allergic rhinitis, CAD s/p CABG in 1999, OA of hip, h/o MI, adenomatous colonic polyps, HLD, lumbar disc disease, R knee meniscus tear, removal of thyroid nodule, lumbar spinal stenosis, shoulder arthroscopy wtih rotator cuff repair and subacromial decompression    PT Frequency 2x / week    PT Duration 8 weeks    PT Treatment/Interventions ADLs/Self Care Home Management;Aquatic Therapy;Electrical Stimulation;DME Instruction;Gait training;Stair training;Functional mobility training;Therapeutic activities;Therapeutic exercise;Balance training;Neuromuscular re-education;Patient/family education    PT Next Visit Plan balance, strength, endurance, start KX now    [place] and Agree with Plan of Care Patient    Family Member Consulted wife           Patient will benefit from skilled therapeutic intervention in order to improve the following deficits and impairments:  Abnormal gait,Decreased balance,Decreased strength,Difficulty walking  Visit Diagnosis: Repeated falls  Muscle weakness (generalized)  Other abnormalities of gait and mobility  Unsteadiness on feet     Problem List Patient Active Problem List   Diagnosis Date Noted  . Dysfunction of Eustachian tube, bilateral 07/21/2020  . Gait disorder 04/20/2016  . Hyperglycemia 04/20/2016  . Abnormal CT scan, chest 06/03/2014  . Erectile dysfunction 06/03/2014  . Spinal stenosis of lumbar region 03/15/2014  . Cough 03/12/2013  . Allergic rhinitis 03/12/2013  . Leg pain,  bilateral 12/30/2011  . Bladder neck obstruction 12/30/2011  . Preventative health care 12/25/2011  . Lumbar disc disease 12/25/2011  . S/P removal of thyroid nodule 12/25/2011  . History of MI (myocardial infarction) 12/25/2011  . Right knee meniscal tear 12/25/2011  . Degenerative arthritis of hip 12/25/2011  . HIP PAIN, RIGHT 05/14/2010  . FATIGUE 10/13/2009  . HYPERTENSION, BENIGN 02/25/2009  . CAD, AUTOLOGOUS BYPASS GRAFT 02/25/2009  . CORONARY ARTERY ANEURYSM 02/25/2009  . BACK PAIN 04/15/2008  . Hyperlipidemia 02/13/2008  . CORONARY ARTERY DISEASE 02/13/2008  . DIVERTICULOSIS, COLON 02/13/2008  . CARDIAC DISEASE, HX OF 02/13/2008  . COLONIC POLYPS, HX OF 02/13/2008     Larry [name], PT 12/01/20 4:13 PM   [facility] [address] [place], [place], [address] Phone: [phone]   Fax:  [phone]  Name: Larry [name] MRN: [id number] Date of Birth: 09-Mar-1932

## 2020-12-03 ENCOUNTER — Other Ambulatory Visit: Payer: Self-pay

## 2020-12-03 ENCOUNTER — Ambulatory Visit: Payer: Medicare Other

## 2020-12-03 DIAGNOSIS — R2689 Other abnormalities of gait and mobility: Secondary | ICD-10-CM

## 2020-12-03 DIAGNOSIS — R2681 Unsteadiness on feet: Secondary | ICD-10-CM

## 2020-12-03 DIAGNOSIS — R296 Repeated falls: Secondary | ICD-10-CM | POA: Diagnosis not present

## 2020-12-03 DIAGNOSIS — M6281 Muscle weakness (generalized): Secondary | ICD-10-CM | POA: Diagnosis not present

## 2020-12-03 NOTE — Therapy (Signed)
[facility] [address] [place], [place], [address] Phone: [phone]   Fax:  [phone]  Physical Therapy Treatment  Patient Details  Name: Larry [name] MRN: [id number] Date of Birth: May 09, 1932 Referring Provider (PT): Earleen Newport, MD   Encounter Date: 12/03/2020   PT End of Session - 12/03/20 1609    Visit Number 22    Date for PT Re-Evaluation 01/28/20    Authorization Type Medicare and [employer]    Progress Note Due on Visit 30    PT Start Time 1525    PT Stop Time 1609    PT Time Calculation (min) 44 min    Activity Tolerance Patient tolerated treatment well    Behavior During Therapy [facility] for tasks assessed/performed           Past Medical History:  Diagnosis Date  . Allergic rhinitis, cause unspecified 03/12/2013  . Allergy   . CAD (coronary artery disease)    S/P CABG in 1999   . Cataract   . Decreased exercise tolerance    Exertional fatigue  . Degenerative arthritis of hip   . Diverticulosis of colon   . ED (erectile dysfunction)   . Erectile dysfunction 06/03/2014  . History of colonic polyps   . History of kidney stones   . History of MI (myocardial infarction)   . Hx of adenomatous colonic polyps 2000  . Hyperlipidemia    Low HDL  . Lumbar disc disease    Hx of with recent back pain and buttock pain  . Myocardial infarction ([place]) 1999  . Right knee meniscal tear   . S/P removal of thyroid nodule   . Spinal stenosis of lumbar region 03/15/2014    Past Surgical History:  Procedure Laterality Date  . CARDIAC CATHETERIZATION    . CATARACT EXTRACTION    . COLONOSCOPY    . CORONARY ARTERY BYPASS GRAFT  1999  . CYSTOSCOPY/URETEROSCOPY/HOLMIUM LASER/STENT PLACEMENT Left 12/23/2017   Procedure: CYSTOSCOPY/RETROGRADE/URETEROSCOPY/HOLMIUM LASER/STENT PLACEMENT;  Surgeon: Festus Aloe, MD;  Location: [facility];  Service: Urology;  Laterality: Left;  ONLY NEEDS 60 MIN  . LUMBAR  DISC SURGERY    . Nodules on Thyroid    . POLYPECTOMY    . PTCA    . SHOULDER ARTHROSCOPY WITH ROTATOR CUFF REPAIR AND SUBACROMIAL DECOMPRESSION Right 01/30/2015   Procedure: RIGHT ARTHROSCOPY SHOULDER SUBACROMIAL DECOMPRESSION DISTAL CLAVICAL RESECTION RETATOR CUFF REPAIR;  Surgeon: Justice Britain, MD;  Location: [facility];  Service: Orthopedics;  Laterality: Right;  . TONSILLECTOMY    . VASECTOMY      There were no vitals filed for this visit.   Subjective Assessment - 12/03/20 1535    Subjective I almost forgot about my appointment and I was going to go for a walk.    Patient Stated Goals Would like to improve balance to be able to walk longer distances, strengthen R calf, be able to get out of a chair, turn and bend forward without fear of falling.    Currently in Pain? No/denies                             [facility] Adult PT Treatment/Exercise - 12/03/20 0001      High Level Balance   High Level Balance Activities Side stepping;Tandem walking      Therapeutic Activites    ADL's sit to stand; stairs, lifting, carrying     Lifting 15# kettle  bell to shin level 20x       Knee/Hip Exercises: Aerobic   Nustep L4 10 min while discussing status/progress       Knee/Hip Exercises: Machines for Strengthening   Total Gym Leg Press Seat 7: BLE 105# 2x15 reps, 60# single leg 2x10      Knee/Hip Exercises: Standing   Walking with Sports Cord 15# forward and reverse x10    Other Standing Knee Exercises floor ladder: stepping with heel strike, marching                    PT Short Term Goals - 10/27/20 1301      PT SHORT TERM GOAL #2   Title Pt will improve RLE gastroc/soleus strength to be able to perform 2-3 single leg heel lifts to improve push off during gait    Baseline 0 single leg heel raise on Rt, Lt x 5    Time 4    Period Weeks    Status On-going             PT Long Term Goals - 11/26/20 1407      PT LONG TERM GOAL #1   Title Pt will demonstrate  independence with final HEP    Time 8    Period Weeks    Status On-going      PT LONG TERM GOAL #2   Title Pt will demonstrate ability to perform 5-6 single leg heel lifts on RLE to improve push off for gait    Baseline Pt not able to do this due to chronic Rt ankle weakness    Status Deferred      PT LONG TERM GOAL #3   Title Pt will improve BERG balance score to >/= 51/56 to indicate decreased risk for falls    Baseline 49/56-significant to high falls risk    Status On-going      PT LONG TERM GOAL #4   Title Pt will negotiate 12 stairs with one rail, alternating sequence without catching either foot on stairs, MOD I    Baseline able to perform with step-over-step: 1-2 episodes of catching foot but not frequent    Status On-going      PT LONG TERM GOAL #5   Title Pt will demonstrate ability to stand from low chair (14") without use of UE x 5 reps in </= 12 seconds    Baseline 10.47 seconds from mat table- no 14" chairs here    Status Achieved      PT LONG TERM GOAL #6   Title improve strength, endurance and stability to safely walk 15 minutes at the park without need to sit and rest    Baseline 10 minutes now    Status On-going                 Plan - 12/03/20 1555    Clinical Impression Statement Pt demonstrates improving dynamic balance overall with improved stability with gait in the community. Pt is limited to walking 12 minutes now (improved from 10 minutes) due to fatigue and instability with longer distances. Pt is highly compliant with his HEP and regularly walking program.  Pt demonstrated good eccentric control with sit to stand with 15# kettle bell.  Pt was challenged with taking longer steps and balancing for longer periods with stepping and marching with floor ladder and this improved as more repetitions completed.  Therapist providing close supervision at [employer] at times for safety.  Pt will benefit from  skilled PT to improve gait, strength, balance and endurance to  maximize safety at home and in the community.    PT Treatment/Interventions ADLs/Self Care Home Management;Aquatic Therapy;Electrical Stimulation;DME Instruction;Gait training;Stair training;Functional mobility training;Therapeutic activities;Therapeutic exercise;Balance training;Neuromuscular re-education;Patient/family education    PT Next Visit Plan balance, strength, endurance, start KX now    PT Home Exercise Plan [id number]    Consulted and Agree with Plan of Care Patient           Patient will benefit from skilled therapeutic intervention in order to improve the following deficits and impairments:  Abnormal gait,Decreased balance,Decreased strength,Difficulty walking  Visit Diagnosis: Muscle weakness (generalized)  Repeated falls  Other abnormalities of gait and mobility  Unsteadiness on feet     Problem List Patient Active Problem List   Diagnosis Date Noted  . Dysfunction of Eustachian tube, bilateral 07/21/2020  . Gait disorder 04/20/2016  . Hyperglycemia 04/20/2016  . Abnormal CT scan, chest 06/03/2014  . Erectile dysfunction 06/03/2014  . Spinal stenosis of lumbar region 03/15/2014  . Cough 03/12/2013  . Allergic rhinitis 03/12/2013  . Leg pain, bilateral 12/30/2011  . Bladder neck obstruction 12/30/2011  . Preventative health care 12/25/2011  . Lumbar disc disease 12/25/2011  . S/P removal of thyroid nodule 12/25/2011  . History of MI (myocardial infarction) 12/25/2011  . Right knee meniscal tear 12/25/2011  . Degenerative arthritis of hip 12/25/2011  . HIP PAIN, RIGHT 05/14/2010  . FATIGUE 10/13/2009  . HYPERTENSION, BENIGN 02/25/2009  . CAD, AUTOLOGOUS BYPASS GRAFT 02/25/2009  . CORONARY ARTERY ANEURYSM 02/25/2009  . BACK PAIN 04/15/2008  . Hyperlipidemia 02/13/2008  . CORONARY ARTERY DISEASE 02/13/2008  . DIVERTICULOSIS, COLON 02/13/2008  . CARDIAC DISEASE, HX OF 02/13/2008  . COLONIC POLYPS, HX OF 02/13/2008     Sigurd Sos, PT 12/03/20 4:11  PM  [facility] [address], [place], [place], [address] Phone: [phone]   Fax:  [phone]  Name: Othman Masur MRN: [id number] Date of Birth: 16-Mar-1932

## 2020-12-09 ENCOUNTER — Other Ambulatory Visit: Payer: Self-pay

## 2020-12-09 ENCOUNTER — Ambulatory Visit: Payer: Medicare Other | Admitting: Physical Therapy

## 2020-12-09 DIAGNOSIS — R296 Repeated falls: Secondary | ICD-10-CM

## 2020-12-09 DIAGNOSIS — M6281 Muscle weakness (generalized): Secondary | ICD-10-CM

## 2020-12-09 DIAGNOSIS — R2681 Unsteadiness on feet: Secondary | ICD-10-CM | POA: Diagnosis not present

## 2020-12-09 DIAGNOSIS — R2689 Other abnormalities of gait and mobility: Secondary | ICD-10-CM | POA: Diagnosis not present

## 2020-12-09 NOTE — Therapy (Signed)
[facility] [address], [place], [place], [address] Phone: [phone]   Fax:  [phone]  Physical Therapy Treatment  Patient Details  Name: Larry [name] MRN: [id number] Date of Birth: Jun 30, 1932 Referring Provider (PT): Earleen Newport, MD   Encounter Date: 12/09/2020   PT End of Session - 12/09/20 1405    Visit Number 23    Date for PT Re-Evaluation 01/28/20    Authorization Type Medicare and [employer]    Progress Note Due on Visit 30    PT Start Time 1400    PT Stop Time 1440    PT Time Calculation (min) 40 min    Activity Tolerance Patient tolerated treatment well           Past Medical History:  Diagnosis Date  . Allergic rhinitis, cause unspecified 03/12/2013  . Allergy   . CAD (coronary artery disease)    S/P CABG in 1999   . Cataract   . Decreased exercise tolerance    Exertional fatigue  . Degenerative arthritis of hip   . Diverticulosis of colon   . ED (erectile dysfunction)   . Erectile dysfunction 06/03/2014  . History of colonic polyps   . History of kidney stones   . History of MI (myocardial infarction)   . Hx of adenomatous colonic polyps 2000  . Hyperlipidemia    Low HDL  . Lumbar disc disease    Hx of with recent back pain and buttock pain  . Myocardial infarction ([place]) 1999  . Right knee meniscal tear   . S/P removal of thyroid nodule   . Spinal stenosis of lumbar region 03/15/2014    Past Surgical History:  Procedure Laterality Date  . CARDIAC CATHETERIZATION    . CATARACT EXTRACTION    . COLONOSCOPY    . CORONARY ARTERY BYPASS GRAFT  1999  . CYSTOSCOPY/URETEROSCOPY/HOLMIUM LASER/STENT PLACEMENT Left 12/23/2017   Procedure: CYSTOSCOPY/RETROGRADE/URETEROSCOPY/HOLMIUM LASER/STENT PLACEMENT;  Surgeon: Festus Aloe, MD;  Location: [facility];  Service: Urology;  Laterality: Left;  ONLY NEEDS 60 MIN  . LUMBAR DISC SURGERY    . Nodules on Thyroid    . POLYPECTOMY    .  PTCA    . SHOULDER ARTHROSCOPY WITH ROTATOR CUFF REPAIR AND SUBACROMIAL DECOMPRESSION Right 01/30/2015   Procedure: RIGHT ARTHROSCOPY SHOULDER SUBACROMIAL DECOMPRESSION DISTAL CLAVICAL RESECTION RETATOR CUFF REPAIR;  Surgeon: Justice Britain, MD;  Location: [facility];  Service: Orthopedics;  Laterality: Right;  . TONSILLECTOMY    . VASECTOMY      There were no vitals filed for this visit.   Subjective Assessment - 12/09/20 1401    Subjective Balance is getting better.  I've been walking without my cane in the park.  Standing on one leg is difficult.  Spends 1 1/2 hours at the park with some rest breaks.    Pertinent History In 1964 had disc removed with residual right LE weakness    Diagnostic tests Xray demonstrated lumbar scoliosis and spondylolisthesis stable from a year ago.    Patient Stated Goals Would like to improve balance to be able to walk longer distances, strengthen R calf, be able to get out of a chair, turn and bend forward without fear of falling.    Currently in Pain? No/denies                             [facility] Adult PT Treatment/Exercise - 12/09/20 0001  Therapeutic Activites    ADL's sit to stand; stairs, lifting, carrying     Lifting 15# kettle bell to shin level 20x       Neuro Re-ed    Neuro Re-ed Details  standing on black foam with head turns, reaches, staggered stand, therapist perturbances, eyes open and closed      Knee/Hip Exercises: Stretches   Active Hamstring Stretch Right;Left;5 reps    Active Hamstring Stretch Limitations 2nd step    Other Knee/Hip Stretches 2nd step hip flexor stretch with UE movements 10x       Knee/Hip Exercises: Aerobic   Nustep L4 10 min while discussing status/progress       Knee/Hip Exercises: Machines for Strengthening   Total Gym Leg Press Seat 7: BLE 105# 2x15 reps, 60# single leg 2x10      Knee/Hip Exercises: Standing   Other Standing Knee Exercises 15# dead lifts 10x    Other Standing Knee Exercises 5#  kettlebell snatch to shoulder and press overhead5x right/left      Knee/Hip Exercises: Seated   Other Seated Knee/Hip Exercises seated heel lift 15# 2x10 bil each      Shoulder Exercises: Standing   Extension Both;15 reps    Extension Weight (lbs) 25    Row Both;15 reps    Row Weight (lbs) 25                    PT Short Term Goals - 10/27/20 1301      PT SHORT TERM GOAL #2   Title Pt will improve RLE gastroc/soleus strength to be able to perform 2-3 single leg heel lifts to improve push off during gait    Baseline 0 single leg heel raise on Rt, Lt x 5    Time 4    Period Weeks    Status On-going             PT Long Term Goals - 11/26/20 1407      PT LONG TERM GOAL #1   Title Pt will demonstrate independence with final HEP    Time 8    Period Weeks    Status On-going      PT LONG TERM GOAL #2   Title Pt will demonstrate ability to perform 5-6 single leg heel lifts on RLE to improve push off for gait    Baseline Pt not able to do this due to chronic Rt ankle weakness    Status Deferred      PT LONG TERM GOAL #3   Title Pt will improve BERG balance score to >/= 51/56 to indicate decreased risk for falls    Baseline 49/56-significant to high falls risk    Status On-going      PT LONG TERM GOAL #4   Title Pt will negotiate 12 stairs with one rail, alternating sequence without catching either foot on stairs, MOD I    Baseline able to perform with step-over-step: 1-2 episodes of catching foot but not frequent    Status On-going      PT LONG TERM GOAL #5   Title Pt will demonstrate ability to stand from low chair (14") without use of UE x 5 reps in </= 12 seconds    Baseline 10.47 seconds from mat table- no 14" chairs here    Status Achieved      PT LONG TERM GOAL #6   Title improve strength, endurance and stability to safely walk 15 minutes at the park without need  to sit and rest    Baseline 10 minutes now    Status On-going                  Plan - 12/09/20 1723    Clinical Impression Statement The patient is able to participate in progressive strengthening of LEs and core muscles in addition of moderate balance challenges.  He is able to stabilize well on soft foam mat with head turns and reaches with feet shoulder width apart.  He has more difficulty with staggered standing on soft surface with manual perturbances and also with eyes closed.  He requires self assist on the railing or therapist assist to stabilize.  He reports general fatigue but no exacerbation of LBP or knee pain.  Therapist monitoring response and providing close supervision for safety.    Comorbidities allergic rhinitis, CAD s/p CABG in 1999, OA of hip, h/o MI, adenomatous colonic polyps, HLD, lumbar disc disease, R knee meniscus tear, removal of thyroid nodule, lumbar spinal stenosis, shoulder arthroscopy wtih rotator cuff repair and subacromial decompression    Rehab Potential Good    PT Frequency 2x / week    PT Duration 8 weeks    PT Treatment/Interventions ADLs/Self Care Home Management;Aquatic Therapy;Electrical Stimulation;DME Instruction;Gait training;Stair training;Functional mobility training;Therapeutic activities;Therapeutic exercise;Balance training;Neuromuscular re-education;Patient/family education    PT Next Visit Plan balance, strength, endurance, start KX now    PT Home Exercise Plan [id number]           Patient will benefit from skilled therapeutic intervention in order to improve the following deficits and impairments:  Abnormal gait,Decreased balance,Decreased strength,Difficulty walking  Visit Diagnosis: Muscle weakness (generalized)  Repeated falls  Other abnormalities of gait and mobility  Unsteadiness on feet     Problem List Patient Active Problem List   Diagnosis Date Noted  . Dysfunction of Eustachian tube, bilateral 07/21/2020  . Gait disorder 04/20/2016  . Hyperglycemia 04/20/2016  . Abnormal CT scan, chest 06/03/2014  .  Erectile dysfunction 06/03/2014  . Spinal stenosis of lumbar region 03/15/2014  . Cough 03/12/2013  . Allergic rhinitis 03/12/2013  . Leg pain, bilateral 12/30/2011  . Bladder neck obstruction 12/30/2011  . Preventative health care 12/25/2011  . Lumbar disc disease 12/25/2011  . S/P removal of thyroid nodule 12/25/2011  . History of MI (myocardial infarction) 12/25/2011  . Right knee meniscal tear 12/25/2011  . Degenerative arthritis of hip 12/25/2011  . HIP PAIN, RIGHT 05/14/2010  . FATIGUE 10/13/2009  . HYPERTENSION, BENIGN 02/25/2009  . CAD, AUTOLOGOUS BYPASS GRAFT 02/25/2009  . CORONARY ARTERY ANEURYSM 02/25/2009  . BACK PAIN 04/15/2008  . Hyperlipidemia 02/13/2008  . CORONARY ARTERY DISEASE 02/13/2008  . DIVERTICULOSIS, COLON 02/13/2008  . CARDIAC DISEASE, HX OF 02/13/2008  . COLONIC POLYPS, HX OF 02/13/2008   Lavinia Sharps, PT 12/09/20 5:29 PM Phone: [phone] Fax: [phone] Vivien Presto 12/09/2020, 5:29 PM  [facility] [address] [place], [place], [address] Phone: [phone]   Fax:  [phone]  Name: Larry [name] MRN: [id number] Date of Birth: [birth year]

## 2020-12-10 ENCOUNTER — Ambulatory Visit: Payer: Medicare Other

## 2020-12-10 DIAGNOSIS — R2681 Unsteadiness on feet: Secondary | ICD-10-CM | POA: Diagnosis not present

## 2020-12-10 DIAGNOSIS — R296 Repeated falls: Secondary | ICD-10-CM | POA: Diagnosis not present

## 2020-12-10 DIAGNOSIS — R2689 Other abnormalities of gait and mobility: Secondary | ICD-10-CM | POA: Diagnosis not present

## 2020-12-10 DIAGNOSIS — M6281 Muscle weakness (generalized): Secondary | ICD-10-CM

## 2020-12-10 NOTE — Therapy (Signed)
[facility] [address], [place], [place], [address] Phone: [phone]   Fax:  [phone]  Physical Therapy Treatment  Patient Details  Name: Larry [name] MRN: [id number] Date of Birth: [birth year] Referring Provider (PT): Earleen Newport, MD   Encounter Date: 12/10/2020   PT End of Session - 12/10/20 1441    Visit Number 24    Date for PT Re-Evaluation 01/28/20    Authorization Type Medicare and [employer]    Progress Note Due on Visit 30    PT Start Time 1401    PT Stop Time 1442    PT Time Calculation (min) 41 min    Activity Tolerance Patient tolerated treatment well    Behavior During Therapy [employer] for tasks assessed/performed           Past Medical History:  Diagnosis Date  . Allergic rhinitis, cause unspecified 03/12/2013  . Allergy   . CAD (coronary artery disease)    S/P CABG in 1999   . Cataract   . Decreased exercise tolerance    Exertional fatigue  . Degenerative arthritis of hip   . Diverticulosis of colon   . ED (erectile dysfunction)   . Erectile dysfunction 06/03/2014  . History of colonic polyps   . History of kidney stones   . History of MI (myocardial infarction)   . Hx of adenomatous colonic polyps 2000  . Hyperlipidemia    Low HDL  . Lumbar disc disease    Hx of with recent back pain and buttock pain  . Myocardial infarction ([place]) 1999  . Right knee meniscal tear   . S/P removal of thyroid nodule   . Spinal stenosis of lumbar region 03/15/2014    Past Surgical History:  Procedure Laterality Date  . CARDIAC CATHETERIZATION    . CATARACT EXTRACTION    . COLONOSCOPY    . CORONARY ARTERY BYPASS GRAFT  1999  . CYSTOSCOPY/URETEROSCOPY/HOLMIUM LASER/STENT PLACEMENT Left 12/23/2017   Procedure: CYSTOSCOPY/RETROGRADE/URETEROSCOPY/HOLMIUM LASER/STENT PLACEMENT;  Surgeon: Festus Aloe, MD;  Location: [facility];  Service: Urology;  Laterality: Left;  ONLY NEEDS 60 MIN  . LUMBAR  DISC SURGERY    . Nodules on Thyroid    . POLYPECTOMY    . PTCA    . SHOULDER ARTHROSCOPY WITH ROTATOR CUFF REPAIR AND SUBACROMIAL DECOMPRESSION Right 01/30/2015   Procedure: RIGHT ARTHROSCOPY SHOULDER SUBACROMIAL DECOMPRESSION DISTAL CLAVICAL RESECTION RETATOR CUFF REPAIR;  Surgeon: Justice Britain, MD;  Location: [place];  Service: Orthopedics;  Laterality: Right;  . TONSILLECTOMY    . VASECTOMY      There were no vitals filed for this visit.   Subjective Assessment - 12/10/20 1402    Subjective I'm doing good.    Currently in Pain? No/denies                             [facility] Adult PT Treatment/Exercise - 12/10/20 0001      Ambulation/Gait   Gait Comments floor ladder, marching and stepping with heel strike      Therapeutic Activites    ADL's sit to stand; stairs, lifting, carrying     Lifting 15# kettle bell to shin level 20x       Neuro Re-ed    Neuro Re-ed Details  standing on black foam with head turns, reaches, staggered stand, therapist perturbances, eyes open and closed      Knee/Hip Exercises: Stretches   Active Hamstring  Stretch Right;Left;5 reps    Active Hamstring Stretch Limitations 2nd step      Knee/Hip Exercises: Aerobic   Nustep L4 10 min while discussing status/progress       Knee/Hip Exercises: Machines for Strengthening   Total Gym Leg Press Seat 7: BLE 105# 2x15 reps, 60# single leg 2x10      Knee/Hip Exercises: Standing   Walking with Sports Cord 15# forward and reverse x10    Other Standing Knee Exercises 15# dead lifts 10x    Other Standing Knee Exercises 5# kettlebell snatch to shoulder and press overhead5x right/left      Shoulder Exercises: Standing   Extension Both;15 reps    Extension Weight (lbs) 25    Row Both;15 reps    Row Weight (lbs) 25                    PT Short Term Goals - 10/27/20 1301      PT SHORT TERM GOAL #2   Title Pt will improve RLE gastroc/soleus strength to be able to perform 2-3 single leg  heel lifts to improve push off during gait    Baseline 0 single leg heel raise on Rt, Lt x 5    Time 4    Period Weeks    Status On-going             PT Long Term Goals - 11/26/20 1407      PT LONG TERM GOAL #1   Title Pt will demonstrate independence with final HEP    Time 8    Period Weeks    Status On-going      PT LONG TERM GOAL #2   Title Pt will demonstrate ability to perform 5-6 single leg heel lifts on RLE to improve push off for gait    Baseline Pt not able to do this due to chronic Rt ankle weakness    Status Deferred      PT LONG TERM GOAL #3   Title Pt will improve BERG balance score to >/= 51/56 to indicate decreased risk for falls    Baseline 49/56-significant to high falls risk    Status On-going      PT LONG TERM GOAL #4   Title Pt will negotiate 12 stairs with one rail, alternating sequence without catching either foot on stairs, MOD I    Baseline able to perform with step-over-step: 1-2 episodes of catching foot but not frequent    Status On-going      PT LONG TERM GOAL #5   Title Pt will demonstrate ability to stand from low chair (14") without use of UE x 5 reps in </= 12 seconds    Baseline 10.47 seconds from mat table- no 14" chairs here    Status Achieved      PT LONG TERM GOAL #6   Title improve strength, endurance and stability to safely walk 15 minutes at the park without need to sit and rest    Baseline 10 minutes now    Status On-going                 Plan - 12/10/20 1429    Clinical Impression Statement The patient is able to participate in progressive strengthening of LEs and core muscles in addition of moderate balance challenges.  Pt requires close guarding and min assistance for stability with floor ladder activity today. Pt reports improved endurance, strength and balance at home and in the community.  Pt reports general fatigue with exercise but no exacerbation of LBP or knee pain.  Therapist monitoring response and providing  close supervision for all exercises today.    PT Frequency 2x / week    PT Duration 8 weeks    PT Treatment/Interventions ADLs/Self Care Home Management;Aquatic Therapy;Electrical Stimulation;DME Instruction;Gait training;Stair training;Functional mobility training;Therapeutic activities;Therapeutic exercise;Balance training;Neuromuscular re-education;Patient/family education    PT Next Visit Plan balance, strength, endurance, start KX now    PT Home Exercise Plan [email or web address]    Consulted and Agree with Plan of Care Patient           Patient will benefit from skilled therapeutic intervention in order to improve the following deficits and impairments:  Abnormal gait,Decreased balance,Decreased strength,Difficulty walking  Visit Diagnosis: Muscle weakness (generalized)  Repeated falls  Other abnormalities of gait and mobility  Unsteadiness on feet     Problem List Patient Active Problem List   Diagnosis Date Noted  . Dysfunction of Eustachian tube, bilateral 07/21/2020  . Gait disorder 04/20/2016  . Hyperglycemia 04/20/2016  . Abnormal CT scan, chest 06/03/2014  . Erectile dysfunction 06/03/2014  . Spinal stenosis of lumbar region 03/15/2014  . Cough 03/12/2013  . Allergic rhinitis 03/12/2013  . Leg pain, bilateral 12/30/2011  . Bladder neck obstruction 12/30/2011  . Preventative health care 12/25/2011  . Lumbar disc disease 12/25/2011  . S/P removal of thyroid nodule 12/25/2011  . History of MI (myocardial infarction) 12/25/2011  . Right knee meniscal tear 12/25/2011  . Degenerative arthritis of hip 12/25/2011  . HIP PAIN, RIGHT 05/14/2010  . FATIGUE 10/13/2009  . HYPERTENSION, BENIGN 02/25/2009  . CAD, AUTOLOGOUS BYPASS GRAFT 02/25/2009  . CORONARY ARTERY ANEURYSM 02/25/2009  . BACK PAIN 04/15/2008  . Hyperlipidemia 02/13/2008  . CORONARY ARTERY DISEASE 02/13/2008  . DIVERTICULOSIS, COLON 02/13/2008  . CARDIAC DISEASE, HX OF 02/13/2008  . COLONIC POLYPS, HX OF  02/13/2008    Sigurd Sos, PT 12/10/20 2:47 PM  [facility] [address], [place], [place], [address] Phone: [phone]   Fax:  [phone]  Name: Larry [name] MRN: [id number] Date of Birth: 05/18/1932

## 2020-12-15 ENCOUNTER — Ambulatory Visit: Payer: Medicare Other

## 2020-12-15 ENCOUNTER — Other Ambulatory Visit: Payer: Self-pay

## 2020-12-15 DIAGNOSIS — R2681 Unsteadiness on feet: Secondary | ICD-10-CM

## 2020-12-15 DIAGNOSIS — M6281 Muscle weakness (generalized): Secondary | ICD-10-CM

## 2020-12-15 DIAGNOSIS — R296 Repeated falls: Secondary | ICD-10-CM | POA: Diagnosis not present

## 2020-12-15 DIAGNOSIS — R2689 Other abnormalities of gait and mobility: Secondary | ICD-10-CM

## 2020-12-15 MED ORDER — TAMSULOSIN HCL 0.4 MG PO CAPS: 0.4000 mg | ORAL_CAPSULE | Freq: Every day | ORAL | 1 refills | Status: DC

## 2020-12-15 NOTE — Therapy (Signed)
[facility] [address] [place], [place], [address] Phone: [phone]   Fax:  [phone]  Physical Therapy Treatment  Patient Details  Name: Larry [name] MRN: [id number] Date of Birth: 21-Oct-1932 Referring Provider (PT): Earleen Newport, MD   Encounter Date: 12/15/2020   PT End of Session - 12/15/20 1444    Visit Number 25    Date for PT Re-Evaluation 01/28/20    Authorization Type Medicare and [employer]    Progress Note Due on Visit 30    PT Start Time 1400    PT Stop Time 1444    PT Time Calculation (min) 44 min    Activity Tolerance Patient tolerated treatment well    Behavior During Therapy [facility] for tasks assessed/performed           Past Medical History:  Diagnosis Date  . Allergic rhinitis, cause unspecified 03/12/2013  . Allergy   . CAD (coronary artery disease)    S/P CABG in 1999   . Cataract   . Decreased exercise tolerance    Exertional fatigue  . Degenerative arthritis of hip   . Diverticulosis of colon   . ED (erectile dysfunction)   . Erectile dysfunction 06/03/2014  . History of colonic polyps   . History of kidney stones   . History of MI (myocardial infarction)   . Hx of adenomatous colonic polyps 2000  . Hyperlipidemia    Low HDL  . Lumbar disc disease    Hx of with recent back pain and buttock pain  . Myocardial infarction ([place]) 1999  . Right knee meniscal tear   . S/P removal of thyroid nodule   . Spinal stenosis of lumbar region 03/15/2014    Past Surgical History:  Procedure Laterality Date  . CARDIAC CATHETERIZATION    . CATARACT EXTRACTION    . COLONOSCOPY    . CORONARY ARTERY BYPASS GRAFT  1999  . CYSTOSCOPY/URETEROSCOPY/HOLMIUM LASER/STENT PLACEMENT Left 12/23/2017   Procedure: CYSTOSCOPY/RETROGRADE/URETEROSCOPY/HOLMIUM LASER/STENT PLACEMENT;  Surgeon: Festus Aloe, MD;  [facility];  Service: Urology;  Laterality: Left;  ONLY NEEDS 60 MIN  . LUMBAR  DISC SURGERY    . Nodules on Thyroid    . POLYPECTOMY    . PTCA    . SHOULDER ARTHROSCOPY WITH ROTATOR CUFF REPAIR AND SUBACROMIAL DECOMPRESSION Right 01/30/2015   Procedure: RIGHT ARTHROSCOPY SHOULDER SUBACROMIAL DECOMPRESSION DISTAL CLAVICAL RESECTION RETATOR CUFF REPAIR;  Surgeon: Justice Britain, MD;  Location: [place];  Service: Orthopedics;  Laterality: Right;  . TONSILLECTOMY    . VASECTOMY      There were no vitals filed for this visit.   Subjective Assessment - 12/15/20 1402    Subjective I had a good Christmas.  I was busy.    Currently in Pain? No/denies                             [facility] Adult PT Treatment/Exercise - 12/15/20 0001      Ambulation/Gait   Gait Comments floor ladder, marching and stepping with heel strike      Therapeutic Activites    ADL's sit to stand; stairs, lifting, carrying     Lifting 15# kettle bell to shin level 20x       Knee/Hip Exercises: Aerobic   Nustep L4 10 min while discussing status/progress       Knee/Hip Exercises: Machines for Strengthening   Total Gym Leg Press Seat 7:  BLE 105# 2x15 reps, 60# single leg 2x10      Knee/Hip Exercises: Standing   Walking with Sports Cord 15# forward and reverse x10    Other Standing Knee Exercises 15# dead lifts 10x      Knee/Hip Exercises: Seated   Other Seated Knee/Hip Exercises seated heel lift 15# 2x10 bil each      Shoulder Exercises: Standing   Extension Both;15 reps    Extension Weight (lbs) 25    Row Both;15 reps    Row Weight (lbs) 25                    PT Short Term Goals - 10/27/20 1301      PT SHORT TERM GOAL #2   Title Pt will improve RLE gastroc/soleus strength to be able to perform 2-3 single leg heel lifts to improve push off during gait    Baseline 0 single leg heel raise on Rt, Lt x 5    Time 4    Period Weeks    Status On-going             PT Long Term Goals - 11/26/20 1407      PT LONG TERM GOAL #1   Title Pt will demonstrate  independence with final HEP    Time 8    Period Weeks    Status On-going      PT LONG TERM GOAL #2   Title Pt will demonstrate ability to perform 5-6 single leg heel lifts on RLE to improve push off for gait    Baseline Pt not able to do this due to chronic Rt ankle weakness    Status Deferred      PT LONG TERM GOAL #3   Title Pt will improve BERG balance score to >/= 51/56 to indicate decreased risk for falls    Baseline 49/56-significant to high falls risk    Status On-going      PT LONG TERM GOAL #4   Title Pt will negotiate 12 stairs with one rail, alternating sequence without catching either foot on stairs, MOD I    Baseline able to perform with step-over-step: 1-2 episodes of catching foot but not frequent    Status On-going      PT LONG TERM GOAL #5   Title Pt will demonstrate ability to stand from low chair (14") without use of UE x 5 reps in </= 12 seconds    Baseline 10.47 seconds from mat table- no 14" chairs here    Status Achieved      PT LONG TERM GOAL #6   Title improve strength, endurance and stability to safely walk 15 minutes at the park without need to sit and rest    Baseline 10 minutes now    Status On-going                 Plan - 12/15/20 1420    Clinical Impression Statement The patient is able to participate in progressive strengthening of LEs and core muscles in addition of moderate balance challenges.  Pt requires close guarding and min assistance for stability with floor ladder although demonstrated improved independence with this task today. Pt reports improved endurance, strength and balance at home and in the community.    Therapist monitoring response and providing close supervision for all exercises today.  Pt will continue to benefit from skilled PT to address balance and endurance to improve safety and independence at home and in the community.  PT Frequency 2x / week    PT Duration 8 weeks    PT Treatment/Interventions ADLs/Self Care  Home Management;Aquatic Therapy;Electrical Stimulation;DME Instruction;Gait training;Stair training;Functional mobility training;Therapeutic activities;Therapeutic exercise;Balance training;Neuromuscular re-education;Patient/family education    PT Next Visit Plan balance, strength, endurance, start KX now    PT Home Exercise Plan [id number]    Consulted and Agree with Plan of Care Patient           Patient will benefit from skilled therapeutic intervention in order to improve the following deficits and impairments:  Abnormal gait,Decreased balance,Decreased strength,Difficulty walking  Visit Diagnosis: Muscle weakness (generalized)  Repeated falls  Other abnormalities of gait and mobility  Unsteadiness on feet     Problem List Patient Active Problem List   Diagnosis Date Noted  . Dysfunction of Eustachian tube, bilateral 07/21/2020  . Gait disorder 04/20/2016  . Hyperglycemia 04/20/2016  . Abnormal CT scan, chest 06/03/2014  . Erectile dysfunction 06/03/2014  . Spinal stenosis of lumbar region 03/15/2014  . Cough 03/12/2013  . Allergic rhinitis 03/12/2013  . Leg pain, bilateral 12/30/2011  . Bladder neck obstruction 12/30/2011  . Preventative health care 12/25/2011  . Lumbar disc disease 12/25/2011  . S/P removal of thyroid nodule 12/25/2011  . History of MI (myocardial infarction) 12/25/2011  . Right knee meniscal tear 12/25/2011  . Degenerative arthritis of hip 12/25/2011  . HIP PAIN, RIGHT 05/14/2010  . FATIGUE 10/13/2009  . HYPERTENSION, BENIGN 02/25/2009  . CAD, AUTOLOGOUS BYPASS GRAFT 02/25/2009  . CORONARY ARTERY ANEURYSM 02/25/2009  . BACK PAIN 04/15/2008  . Hyperlipidemia 02/13/2008  . CORONARY ARTERY DISEASE 02/13/2008  . DIVERTICULOSIS, COLON 02/13/2008  . CARDIAC DISEASE, HX OF 02/13/2008  . COLONIC POLYPS, HX OF 02/13/2008     Sigurd Sos, PT 12/15/20 2:47 PM  [facility] [address]  [place], [place], [address] Phone: [phone]   Fax:  [phone]  Name: Raekwan Spelman MRN: [id number] Date of Birth: [birth year]

## 2020-12-18 ENCOUNTER — Other Ambulatory Visit: Payer: Self-pay

## 2020-12-18 ENCOUNTER — Ambulatory Visit: Payer: Medicare Other

## 2020-12-18 DIAGNOSIS — R2681 Unsteadiness on feet: Secondary | ICD-10-CM

## 2020-12-18 DIAGNOSIS — R296 Repeated falls: Secondary | ICD-10-CM | POA: Diagnosis not present

## 2020-12-18 DIAGNOSIS — R2689 Other abnormalities of gait and mobility: Secondary | ICD-10-CM

## 2020-12-18 DIAGNOSIS — M6281 Muscle weakness (generalized): Secondary | ICD-10-CM | POA: Diagnosis not present

## 2020-12-18 NOTE — Therapy (Signed)
[facility] [address], [place], [place], [address] Phone: [phone]   Fax:  [phone]  Physical Therapy Treatment  Patient Details  Name: Larry [name] MRN: [id number] Date of Birth: 12-28-1931 Referring Provider (PT): Earleen Newport, MD   Encounter Date: 12/18/2020   PT End of Session - 12/18/20 1057    Visit Number 26    Date for PT Re-Evaluation 01/28/20    Authorization Type Medicare and [employer]    Progress Note Due on Visit 30    PT Start Time 1015    PT Stop Time 1058    PT Time Calculation (min) 43 min    Activity Tolerance Patient tolerated treatment well    Behavior During Therapy [facility] for tasks assessed/performed           Past Medical History:  Diagnosis Date  . Allergic rhinitis, cause unspecified 03/12/2013  . Allergy   . CAD (coronary artery disease)    S/P CABG in 1999   . Cataract   . Decreased exercise tolerance    Exertional fatigue  . Degenerative arthritis of hip   . Diverticulosis of colon   . ED (erectile dysfunction)   . Erectile dysfunction 06/03/2014  . History of colonic polyps   . History of kidney stones   . History of MI (myocardial infarction)   . Hx of adenomatous colonic polyps 2000  . Hyperlipidemia    Low HDL  . Lumbar disc disease    Hx of with recent back pain and buttock pain  . Myocardial infarction ([place]) 1999  . Right knee meniscal tear   . S/P removal of thyroid nodule   . Spinal stenosis of lumbar region 03/15/2014    Past Surgical History:  Procedure Laterality Date  . CARDIAC CATHETERIZATION    . CATARACT EXTRACTION    . COLONOSCOPY    . CORONARY ARTERY BYPASS GRAFT  1999  . CYSTOSCOPY/URETEROSCOPY/HOLMIUM LASER/STENT PLACEMENT Left 12/23/2017   Procedure: CYSTOSCOPY/RETROGRADE/URETEROSCOPY/HOLMIUM LASER/STENT PLACEMENT;  Surgeon: Festus Aloe, MD;  [facility];  Service: Urology;  Laterality: Left;  ONLY NEEDS 60 MIN  . LUMBAR  DISC SURGERY    . Nodules on Thyroid    . POLYPECTOMY    . PTCA    . SHOULDER ARTHROSCOPY WITH ROTATOR CUFF REPAIR AND SUBACROMIAL DECOMPRESSION Right 01/30/2015   Procedure: RIGHT ARTHROSCOPY SHOULDER SUBACROMIAL DECOMPRESSION DISTAL CLAVICAL RESECTION RETATOR CUFF REPAIR;  Surgeon: Justice Britain, MD;  Location: [place];  Service: Orthopedics;  Laterality: Right;  . TONSILLECTOMY    . VASECTOMY      There were no vitals filed for this visit.   Subjective Assessment - 12/18/20 1022    Currently in Pain? No/denies                             [facility] Adult PT Treatment/Exercise - 12/18/20 0001      Ambulation/Gait   Gait Comments floor ladder, marching and stepping with heel strike      Therapeutic Activites    ADL's sit to stand; stairs, lifting, carrying     Lifting 15# kettle bell to shin level 20x       Knee/Hip Exercises: Aerobic   Nustep L4 10 min while discussing status/progress       Knee/Hip Exercises: Machines for Strengthening   Total Gym Leg Press Seat 7: BLE 105# 2x15 reps, 60# single leg 2x10  Knee/Hip Exercises: Standing   Walking with Sports Cord 15# forward and reverse x10    Other Standing Knee Exercises 15# dead lifts 10x      Knee/Hip Exercises: Seated   Other Seated Knee/Hip Exercises seated heel lift 15# 2x10 bil each      Shoulder Exercises: Standing   Extension Both;15 reps    Extension Weight (lbs) 25    Row Both;15 reps    Row Weight (lbs) 25                    PT Short Term Goals - 10/27/20 1301      PT SHORT TERM GOAL #2   Title Pt will improve RLE gastroc/soleus strength to be able to perform 2-3 single leg heel lifts to improve push off during gait    Baseline 0 single leg heel raise on Rt, Lt x 5    Time 4    Period Weeks    Status On-going             PT Long Term Goals - 12/18/20 1025      PT LONG TERM GOAL #6   Title improve strength, endurance and stability to safely walk 15 minutes at the park  without need to sit and rest    Baseline 12 minutes    Status On-going                 Plan - 12/18/20 1029    Clinical Impression Statement The patient is able to participate in progressive strengthening of LEs and core muscles in addition of moderate balance challenges.  Pt requires close guarding and min assistance for stability with floor ladder although demonstrated improved independence with this task this week. Pt reports improved endurance, strength and balance at home and in the community.  Pt walks in the park without his cane and is able to walk for 12 minutes before a need for rest.  Pt denies any problems with negotiating steps and hasn't had an episode of catching toe in 2 weeks.    Therapist monitoring response and providing close supervision for all exercises today.  Pt will continue to benefit from skilled PT to address balance and endurance to improve safety and independence at home and in the community.    Comorbidities allergic rhinitis, CAD s/p CABG in 1999, OA of hip, h/o MI, adenomatous colonic polyps, HLD, lumbar disc disease, R knee meniscus tear, removal of thyroid nodule, lumbar spinal stenosis, shoulder arthroscopy wtih rotator cuff repair and subacromial decompression    Stability/Clinical Decision Making Stable/Uncomplicated    PT Frequency 2x / week    PT Duration 8 weeks    PT Treatment/Interventions ADLs/Self Care Home Management;Aquatic Therapy;Electrical Stimulation;DME Instruction;Gait training;Stair training;Functional mobility training;Therapeutic activities;Therapeutic exercise;Balance training;Neuromuscular re-education;Patient/family education    PT Next Visit Plan balance, strength, endurance, discontinue KX for new calendar year    PT Home Exercise Plan [id number]    Consulted and Agree with Plan of Care Patient           Patient will benefit from skilled therapeutic intervention in order to improve the following deficits and impairments:  Abnormal  gait,Decreased balance,Decreased strength,Difficulty walking  Visit Diagnosis: Muscle weakness (generalized)  Repeated falls  Other abnormalities of gait and mobility  Unsteadiness on feet     Problem List Patient Active Problem List   Diagnosis Date Noted  . Dysfunction of Eustachian tube, bilateral 07/21/2020  . Gait disorder 04/20/2016  . Hyperglycemia 04/20/2016  .  Abnormal CT scan, chest 06/03/2014  . Erectile dysfunction 06/03/2014  . Spinal stenosis of lumbar region 03/15/2014  . Cough 03/12/2013  . Allergic rhinitis 03/12/2013  . Leg pain, bilateral 12/30/2011  . Bladder neck obstruction 12/30/2011  . Preventative health care 12/25/2011  . Lumbar disc disease 12/25/2011  . S/P removal of thyroid nodule 12/25/2011  . History of MI (myocardial infarction) 12/25/2011  . Right knee meniscal tear 12/25/2011  . Degenerative arthritis of hip 12/25/2011  . HIP PAIN, RIGHT 05/14/2010  . FATIGUE 10/13/2009  . HYPERTENSION, BENIGN 02/25/2009  . CAD, AUTOLOGOUS BYPASS GRAFT 02/25/2009  . CORONARY ARTERY ANEURYSM 02/25/2009  . BACK PAIN 04/15/2008  . Hyperlipidemia 02/13/2008  . CORONARY ARTERY DISEASE 02/13/2008  . DIVERTICULOSIS, COLON 02/13/2008  . CARDIAC DISEASE, HX OF 02/13/2008  . COLONIC POLYPS, HX OF 02/13/2008     Sigurd Sos, PT 12/18/20 11:00 AM  [facility] [address] [place], [place], [address] Phone: [phone]   Fax:  [phone]  Name: Larry [name] MRN: [id number] Date of Birth: 09-30-1932

## 2020-12-24 DIAGNOSIS — C44329 Squamous cell carcinoma of skin of other parts of face: Secondary | ICD-10-CM | POA: Diagnosis not present

## 2020-12-24 DIAGNOSIS — C44622 Squamous cell carcinoma of skin of right upper limb, including shoulder: Secondary | ICD-10-CM | POA: Diagnosis not present

## 2020-12-26 ENCOUNTER — Encounter: Payer: Medicare Other | Admitting: Physical Therapy

## 2021-01-01 ENCOUNTER — Ambulatory Visit: Payer: Medicare Other | Attending: Neurological Surgery | Admitting: Physical Therapy

## 2021-01-01 ENCOUNTER — Other Ambulatory Visit: Payer: Self-pay

## 2021-01-01 DIAGNOSIS — R2689 Other abnormalities of gait and mobility: Secondary | ICD-10-CM

## 2021-01-01 DIAGNOSIS — M6281 Muscle weakness (generalized): Secondary | ICD-10-CM

## 2021-01-01 DIAGNOSIS — R296 Repeated falls: Secondary | ICD-10-CM | POA: Diagnosis not present

## 2021-01-01 DIAGNOSIS — R2681 Unsteadiness on feet: Secondary | ICD-10-CM

## 2021-01-01 NOTE — Therapy (Signed)
[facility] [address], [place], [place], [address] Phone: [phone]   Fax:  [phone]  Physical Therapy Treatment  Patient Details  Name: Larry [name] MRN: [id number] Date of Birth: Sep 09, 1932 Referring Provider (PT): Earleen Newport, MD   Encounter Date: 01/01/2021   PT End of Session - 01/01/21 1537    Visit Number 27    Date for PT Re-Evaluation 01/28/20    Authorization Type Medicare and [employer]    Progress Note Due on Visit 30    PT Start Time 1445    PT Stop Time 1527    PT Time Calculation (min) 42 min    Activity Tolerance Patient tolerated treatment well           Past Medical History:  Diagnosis Date  . Allergic rhinitis, cause unspecified 03/12/2013  . Allergy   . CAD (coronary artery disease)    S/P CABG in 1999   . Cataract   . Decreased exercise tolerance    Exertional fatigue  . Degenerative arthritis of hip   . Diverticulosis of colon   . ED (erectile dysfunction)   . Erectile dysfunction 06/03/2014  . History of colonic polyps   . History of kidney stones   . History of MI (myocardial infarction)   . Hx of adenomatous colonic polyps 2000  . Hyperlipidemia    Low HDL  . Lumbar disc disease    Hx of with recent back pain and buttock pain  . Myocardial infarction ([place]) 1999  . Right knee meniscal tear   . S/P removal of thyroid nodule   . Spinal stenosis of lumbar region 03/15/2014    Past Surgical History:  Procedure Laterality Date  . CARDIAC CATHETERIZATION    . CATARACT EXTRACTION    . COLONOSCOPY    . CORONARY ARTERY BYPASS GRAFT  1999  . CYSTOSCOPY/URETEROSCOPY/HOLMIUM LASER/STENT PLACEMENT Left 12/23/2017   Procedure: CYSTOSCOPY/RETROGRADE/URETEROSCOPY/HOLMIUM LASER/STENT PLACEMENT;  Surgeon: Festus Aloe, MD;  Location: [facility];  Service: Urology;  Laterality: Left;  ONLY NEEDS 60 MIN  . LUMBAR DISC SURGERY    . Nodules on Thyroid    . POLYPECTOMY    .  PTCA    . SHOULDER ARTHROSCOPY WITH ROTATOR CUFF REPAIR AND SUBACROMIAL DECOMPRESSION Right 01/30/2015   Procedure: RIGHT ARTHROSCOPY SHOULDER SUBACROMIAL DECOMPRESSION DISTAL CLAVICAL RESECTION RETATOR CUFF REPAIR;  Surgeon: Justice Britain, MD;  Location: [place];  Service: Orthopedics;  Laterality: Right;  . TONSILLECTOMY    . VASECTOMY      There were no vitals filed for this visit.   Subjective Assessment - 01/01/21 1448    Subjective Had some skin cancer removed on my arm and the side of my face.    Pertinent History In 1964 had disc removed with residual right LE weakness    Diagnostic tests Xray demonstrated lumbar scoliosis and spondylolisthesis stable from a year ago.    Patient Stated Goals Would like to improve balance to be able to walk longer distances, strengthen R calf, be able to get out of a chair, turn and bend forward without fear of falling.    Currently in Pain? No/denies                             [facility] Adult PT Treatment/Exercise - 01/01/21 0001      Therapeutic Activites    ADL's sit to stand; stairs, lifting, carrying  Neuro Re-ed    Neuro Re-ed Details  step and reach 3 ways foam rolls;turn and reach 1/4 and 1/2 turns; carrying a tray with tennis ball while stepping over and around obstacles;  backwards walking along the counter 3 laps      Knee/Hip Exercises: Aerobic   Nustep L4 10 min while discussing status/progress       Knee/Hip Exercises: Machines for Strengthening   Total Gym Leg Press Seat 7: BLE 105# 2x10 reps, 60# single leg 2x10      Knee/Hip Exercises: Standing   Gait Training lateral stepping up and over flat cones 1 minute    Other Standing Knee Exercises holding 2 5# kettlebells with march 5x; holding 2 5# with heel raises 5x    Other Standing Knee Exercises staggered stand with 5# kettlebell bicep curls      Knee/Hip Exercises: Seated   Other Seated Knee/Hip Exercises 2 5# kettlebells    Sit to Sand 10 reps   10#  kettlebell     Shoulder Exercises: Standing   Other Standing Exercises single arm 5# kettlebell upright pull from knee level to shoulder 10x Right/left                    PT Short Term Goals - 10/27/20 1301      PT SHORT TERM GOAL #2   Title Pt will improve RLE gastroc/soleus strength to be able to perform 2-3 single leg heel lifts to improve push off during gait    Baseline 0 single leg heel raise on Rt, Lt x 5    Time 4    Period Weeks    Status On-going             PT Long Term Goals - 01/01/21 1545      PT LONG TERM GOAL #1   Title Pt will demonstrate independence with final HEP    Time 8    Period Weeks    Status On-going    Target Date 01/28/21      PT LONG TERM GOAL #2   Title Pt will demonstrate ability to perform 5-6 single leg heel lifts on RLE to improve push off for gait    Status Deferred      PT LONG TERM GOAL #3   Title Pt will improve BERG balance score to >/= 51/56 to indicate decreased risk for falls    Time 8    Period Weeks    Status On-going      PT LONG TERM GOAL #4   Title Pt will negotiate 12 stairs with one rail, alternating sequence without catching either foot on stairs, MOD I    Time 8    Period Weeks    Status On-going      PT LONG TERM GOAL #5   Title Pt will demonstrate ability to stand from low chair (14") without use of UE x 5 reps in </= 12 seconds    Status Achieved      PT LONG TERM GOAL #6   Title improve strength, endurance and stability to safely walk 15 minutes at the park without need to sit and rest    Time 8    Period Weeks    Status On-going                 Plan - 01/01/21 1538    Clinical Impression Statement The patient returns after 2 weeks while recovering from skin cancer removal.  He is  able to continue with a progression of strengthening and increased static and dynamic balance challenges.  With balance ex's he does not demonstrate hesistation to start or require more than one attempt to get  into starting position.  He does have increased trunk and knee flexion during the task as well as some increase in rigidity.   He becomes mildly short of breath but this could also be related to exercising in a mask.  He does not required physical assistance to prevent losing his balance but CGA provided for safety.    Comorbidities allergic rhinitis, CAD s/p CABG in 1999, OA of hip, h/o MI, adenomatous colonic polyps, HLD, lumbar disc disease, R knee meniscus tear, removal of thyroid nodule, lumbar spinal stenosis, shoulder arthroscopy wtih rotator cuff repair and subacromial decompression    Rehab Potential Good    PT Frequency 2x / week    PT Duration 8 weeks    PT Treatment/Interventions ADLs/Self Care Home Management;Aquatic Therapy;Electrical Stimulation;DME Instruction;Gait training;Stair training;Functional mobility training;Therapeutic activities;Therapeutic exercise;Balance training;Neuromuscular re-education;Patient/family education    PT Next Visit Plan balance, strength, endurance, discontinue KX for new calendar year    PT Home Exercise Plan [email or web address]           Patient will benefit from skilled therapeutic intervention in order to improve the following deficits and impairments:  Abnormal gait,Decreased balance,Decreased strength,Difficulty walking  Visit Diagnosis: Muscle weakness (generalized)  Repeated falls  Other abnormalities of gait and mobility  Unsteadiness on feet     Problem List Patient Active Problem List   Diagnosis Date Noted  . Dysfunction of Eustachian tube, bilateral 07/21/2020  . Gait disorder 04/20/2016  . Hyperglycemia 04/20/2016  . Abnormal CT scan, chest 06/03/2014  . Erectile dysfunction 06/03/2014  . Spinal stenosis of lumbar region 03/15/2014  . Cough 03/12/2013  . Allergic rhinitis 03/12/2013  . Leg pain, bilateral 12/30/2011  . Bladder neck obstruction 12/30/2011  . Preventative health care 12/25/2011  . Lumbar disc disease 12/25/2011   . S/P removal of thyroid nodule 12/25/2011  . History of MI (myocardial infarction) 12/25/2011  . Right knee meniscal tear 12/25/2011  . Degenerative arthritis of hip 12/25/2011  . HIP PAIN, RIGHT 05/14/2010  . FATIGUE 10/13/2009  . HYPERTENSION, BENIGN 02/25/2009  . CAD, AUTOLOGOUS BYPASS GRAFT 02/25/2009  . CORONARY ARTERY ANEURYSM 02/25/2009  . BACK PAIN 04/15/2008  . Hyperlipidemia 02/13/2008  . CORONARY ARTERY DISEASE 02/13/2008  . DIVERTICULOSIS, COLON 02/13/2008  . CARDIAC DISEASE, HX OF 02/13/2008  . COLONIC POLYPS, HX OF 02/13/2008   Ruben Im, PT 01/01/21 3:47 PM Phone: [phone] Fax: [phone] Alvera Singh 01/01/2021, 3:47 PM  [facility] [address], [place], [place], [address] Phone: [phone]   Fax:  [phone]  Name: Jayvon Rodarte MRN: [id number] Date of Birth: 04-17-1932

## 2021-01-06 ENCOUNTER — Ambulatory Visit: Payer: Medicare Other

## 2021-01-06 ENCOUNTER — Other Ambulatory Visit: Payer: Self-pay

## 2021-01-06 DIAGNOSIS — R2681 Unsteadiness on feet: Secondary | ICD-10-CM

## 2021-01-06 DIAGNOSIS — R296 Repeated falls: Secondary | ICD-10-CM

## 2021-01-06 DIAGNOSIS — M6281 Muscle weakness (generalized): Secondary | ICD-10-CM | POA: Diagnosis not present

## 2021-01-06 DIAGNOSIS — R2689 Other abnormalities of gait and mobility: Secondary | ICD-10-CM

## 2021-01-06 NOTE — Therapy (Signed)
[facility] [address] [place], [place], [address] Phone: [phone]   Fax:  [phone]  Physical Therapy Treatment  Patient Details  Name: Larry [name] MRN: [id number] Date of Birth: December 14, 1932 Referring Provider (PT): Stefani Dama, MD   Encounter Date: 01/06/2021   PT End of Session - 01/06/21 1058    Visit Number 28    Date for PT Re-Evaluation 01/28/20    Authorization Type Medicare and [employer]    Progress Note Due on Visit 30    PT Start Time 1015    PT Stop Time 1059    PT Time Calculation (min) 44 min    Activity Tolerance Patient tolerated treatment well    Behavior During Therapy [facility] for tasks assessed/performed           Past Medical History:  Diagnosis Date  . Allergic rhinitis, cause unspecified 03/12/2013  . Allergy   . CAD (coronary artery disease)    S/P CABG in 1999   . Cataract   . Decreased exercise tolerance    Exertional fatigue  . Degenerative arthritis of hip   . Diverticulosis of colon   . ED (erectile dysfunction)   . Erectile dysfunction 06/03/2014  . History of colonic polyps   . History of kidney stones   . History of MI (myocardial infarction)   . Hx of adenomatous colonic polyps 2000  . Hyperlipidemia    Low HDL  . Lumbar disc disease    Hx of with recent back pain and buttock pain  . Myocardial infarction ([facility]) 1999  . Right knee meniscal tear   . S/P removal of thyroid nodule   . Spinal stenosis of lumbar region 03/15/2014    Past Surgical History:  Procedure Laterality Date  . CARDIAC CATHETERIZATION    . CATARACT EXTRACTION    . COLONOSCOPY    . CORONARY ARTERY BYPASS GRAFT  1999  . CYSTOSCOPY/URETEROSCOPY/HOLMIUM LASER/STENT PLACEMENT Left 12/23/2017   Procedure: CYSTOSCOPY/RETROGRADE/URETEROSCOPY/HOLMIUM LASER/STENT PLACEMENT;  Surgeon: Jerilee Field, MD;  Location: [facility];  Service: Urology;  Laterality: Left;  ONLY NEEDS 60 MIN  . LUMBAR  DISC SURGERY    . Nodules on Thyroid    . POLYPECTOMY    . PTCA    . SHOULDER ARTHROSCOPY WITH ROTATOR CUFF REPAIR AND SUBACROMIAL DECOMPRESSION Right 01/30/2015   Procedure: RIGHT ARTHROSCOPY SHOULDER SUBACROMIAL DECOMPRESSION DISTAL CLAVICAL RESECTION RETATOR CUFF REPAIR;  Surgeon: Francena Hanly, MD;  Location: [facility];  Service: Orthopedics;  Laterality: Right;  . TONSILLECTOMY    . VASECTOMY      There were no vitals filed for this visit.   Subjective Assessment - 01/06/21 1018    Subjective I am working at home.    Patient Stated Goals Would like to improve balance to be able to walk longer distances, strengthen R calf, be able to get out of a chair, turn and bend forward without fear of falling.    Currently in Pain? No/denies                             [facility] Adult PT Treatment/Exercise - 01/06/21 0001      Ambulation/Gait   Pre-Gait Activities walking while holding 2 5# kettle bells.  Weaving in/out of the dark tiles on floor to work on change of direction    Gait Comments floor ladder, marching and stepping with heel strike      Knee/Hip  Exercises: Aerobic   Nustep L4 10 min while discussing status/progress       Knee/Hip Exercises: Machines for Strengthening   Total Gym Leg Press Seat 7: BLE 105# 2x10 reps, 60# single leg 2x10      Knee/Hip Exercises: Standing   Other Standing Knee Exercises holding 2 5# kettlebells with march 5x; holding 2 5# with heel raises 5x    Other Standing Knee Exercises staggered stand with 5# kettlebell bicep curls      Knee/Hip Exercises: Seated   Sit to Sand 10 reps   10# kettlebell     Shoulder Exercises: Standing   Other Standing Exercises single arm 5# kettlebell upright pull from knee level to shoulder 10x Right/left                    PT Short Term Goals - 10/27/20 1301      PT SHORT TERM GOAL #2   Title Pt will improve RLE gastroc/soleus strength to be able to perform 2-3 single leg heel lifts to  improve push off during gait    Baseline 0 single leg heel raise on Rt, Lt x 5    Time 4    Period Weeks    Status On-going             PT Long Term Goals - 01/01/21 1545      PT LONG TERM GOAL #1   Title Pt will demonstrate independence with final HEP    Time 8    Period Weeks    Status On-going    Target Date 01/28/21      PT LONG TERM GOAL #2   Title Pt will demonstrate ability to perform 5-6 single leg heel lifts on RLE to improve push off for gait    Status Deferred      PT LONG TERM GOAL #3   Title Pt will improve BERG balance score to >/= 51/56 to indicate decreased risk for falls    Time 8    Period Weeks    Status On-going      PT LONG TERM GOAL #4   Title Pt will negotiate 12 stairs with one rail, alternating sequence without catching either foot on stairs, MOD I    Time 8    Period Weeks    Status On-going      PT LONG TERM GOAL #5   Title Pt will demonstrate ability to stand from low chair (14") without use of UE x 5 reps in </= 12 seconds    Status Achieved      PT LONG TERM GOAL #6   Title improve strength, endurance and stability to safely walk 15 minutes at the park without need to sit and rest    Time 8    Period Weeks    Status On-going                 Plan - 01/06/21 1021    Clinical Impression Statement Pt is able to continue with a progression of strengthening and increased static and dynamic balance challenges.   Pt has increased trunk and knee flexion during standing balance tasks as well as some increase in rigidity.   He becomes mildly short of breath but this could also be related to exercising in a mask.  He does not required physical assistance to prevent losing his balance but CGA provided for safety with most standing tasks.  Pt is signifcantly challenged with holding kettle bells and  heel raises and marching and requires closer supervision for this.    Rehab Potential Good    PT Frequency 2x / week    PT Duration 8 weeks    PT  Treatment/Interventions ADLs/Self Care Home Management;Aquatic Therapy;Electrical Stimulation;DME Instruction;Gait training;Stair training;Functional mobility training;Therapeutic activities;Therapeutic exercise;Balance training;Neuromuscular re-education;Patient/family education    PT Next Visit Plan balance, strength, endurance, discontinue KX for new calendar year    PT Home Exercise Plan [id number]    Recommended Other Services recert is signed    Consulted and Agree with Plan of Care Patient           Patient will benefit from skilled therapeutic intervention in order to improve the following deficits and impairments:  Abnormal gait,Decreased balance,Decreased strength,Difficulty walking  Visit Diagnosis: Muscle weakness (generalized)  Repeated falls  Other abnormalities of gait and mobility  Unsteadiness on feet     Problem List Patient Active Problem List   Diagnosis Date Noted  . Dysfunction of Eustachian tube, bilateral 07/21/2020  . Gait disorder 04/20/2016  . Hyperglycemia 04/20/2016  . Abnormal CT scan, chest 06/03/2014  . Erectile dysfunction 06/03/2014  . Spinal stenosis of lumbar region 03/15/2014  . Cough 03/12/2013  . Allergic rhinitis 03/12/2013  . Leg pain, bilateral 12/30/2011  . Bladder neck obstruction 12/30/2011  . Preventative health care 12/25/2011  . Lumbar disc disease 12/25/2011  . S/P removal of thyroid nodule 12/25/2011  . History of MI (myocardial infarction) 12/25/2011  . Right knee meniscal tear 12/25/2011  . Degenerative arthritis of hip 12/25/2011  . HIP PAIN, RIGHT 05/14/2010  . FATIGUE 10/13/2009  . HYPERTENSION, BENIGN 02/25/2009  . CAD, AUTOLOGOUS BYPASS GRAFT 02/25/2009  . CORONARY ARTERY ANEURYSM 02/25/2009  . BACK PAIN 04/15/2008  . Hyperlipidemia 02/13/2008  . CORONARY ARTERY DISEASE 02/13/2008  . DIVERTICULOSIS, COLON 02/13/2008  . CARDIAC DISEASE, HX OF 02/13/2008  . COLONIC POLYPS, HX OF 02/13/2008     Larry [name],  PT 01/06/21 11:00 AM   [facility] [address] [place], [place], [address] Phone: [phone]   Fax:  [phone]  Name: Larry [name] MRN: [id number] Date of Birth: 1932-12-01

## 2021-01-08 ENCOUNTER — Ambulatory Visit: Payer: Medicare Other | Admitting: Physical Therapy

## 2021-01-08 ENCOUNTER — Other Ambulatory Visit: Payer: Self-pay

## 2021-01-08 ENCOUNTER — Encounter: Payer: Self-pay | Admitting: Physical Therapy

## 2021-01-08 DIAGNOSIS — R2689 Other abnormalities of gait and mobility: Secondary | ICD-10-CM

## 2021-01-08 DIAGNOSIS — R2681 Unsteadiness on feet: Secondary | ICD-10-CM

## 2021-01-08 DIAGNOSIS — M6281 Muscle weakness (generalized): Secondary | ICD-10-CM | POA: Diagnosis not present

## 2021-01-08 DIAGNOSIS — R296 Repeated falls: Secondary | ICD-10-CM

## 2021-01-08 NOTE — Therapy (Signed)
[facility] [address] [place], [place], [address] Phone: [phone]   Fax:  [phone]  Physical Therapy Treatment  Patient Details  Name: Larry [name] MRN: [id number] Date of Birth: 18-Sep-1932 Referring Provider (PT): Earleen Newport, MD   Encounter Date: 01/08/2021   PT End of Session - 01/08/21 1405    Visit Number 29    Date for PT Re-Evaluation 01/28/20    Authorization Type Medicare and [employer]    Progress Note Due on Visit 30    PT Start Time 1358    PT Stop Time 1437    PT Time Calculation (min) 39 min    Activity Tolerance Patient tolerated treatment well           Past Medical History:  Diagnosis Date  . Allergic rhinitis, cause unspecified 03/12/2013  . Allergy   . CAD (coronary artery disease)    S/P CABG in 1999   . Cataract   . Decreased exercise tolerance    Exertional fatigue  . Degenerative arthritis of hip   . Diverticulosis of colon   . ED (erectile dysfunction)   . Erectile dysfunction 06/03/2014  . History of colonic polyps   . History of kidney stones   . History of MI (myocardial infarction)   . Hx of adenomatous colonic polyps 2000  . Hyperlipidemia    Low HDL  . Lumbar disc disease    Hx of with recent back pain and buttock pain  . Myocardial infarction ([place]) 1999  . Right knee meniscal tear   . S/P removal of thyroid nodule   . Spinal stenosis of lumbar region 03/15/2014    Past Surgical History:  Procedure Laterality Date  . CARDIAC CATHETERIZATION    . CATARACT EXTRACTION    . COLONOSCOPY    . CORONARY ARTERY BYPASS GRAFT  1999  . CYSTOSCOPY/URETEROSCOPY/HOLMIUM LASER/STENT PLACEMENT Left 12/23/2017   Procedure: CYSTOSCOPY/RETROGRADE/URETEROSCOPY/HOLMIUM LASER/STENT PLACEMENT;  Surgeon: Festus Aloe, MD;  Location: [facility];  Service: Urology;  Laterality: Left;  ONLY NEEDS 60 MIN  . LUMBAR DISC SURGERY    . Nodules on Thyroid    . POLYPECTOMY    .  PTCA    . SHOULDER ARTHROSCOPY WITH ROTATOR CUFF REPAIR AND SUBACROMIAL DECOMPRESSION Right 01/30/2015   Procedure: RIGHT ARTHROSCOPY SHOULDER SUBACROMIAL DECOMPRESSION DISTAL CLAVICAL RESECTION RETATOR CUFF REPAIR;  Surgeon: Justice Britain, MD;  Location: [facility];  Service: Orthopedics;  Laterality: Right;  . TONSILLECTOMY    . VASECTOMY      There were no vitals filed for this visit.   Subjective Assessment - 01/08/21 1400    Subjective Very good week.   I think my balance is improved.  I used to feel unstable when I got up at night to go to the bathroom.  Now I just sail right in there.  This has really helped me.    Pertinent History In 1964 had disc removed with residual right LE weakness    Diagnostic tests Xray demonstrated lumbar scoliosis and spondylolisthesis stable from a year ago.    Patient Stated Goals Would like to improve balance to be able to walk longer distances, strengthen R calf, be able to get out of a chair, turn and bend forward without fear of falling.    Currently in Pain? No/denies  [facility] Adult PT Treatment/Exercise - 01/08/21 0001      Therapeutic Activites    ADL's sit to stand; stairs, lifting, carrying       Neuro Re-ed    Neuro Re-ed Details  turn and reach color circles on floor and counter 3 rounds; 1/4 and 1/2 turns; backwards walking next to counter; step touch circles on floor multi directions without UE assist;  walking around floor obstacles      Knee/Hip Exercises: Stretches   Other Knee/Hip Stretches hip flexor stretch with bil UE reaches 5x right/left      Knee/Hip Exercises: Aerobic   Nustep L4 10 min while discussing status/progress       Knee/Hip Exercises: Machines for Strengthening   Total Gym Leg Press Seat 7: BLE 110#  2x10 reps, 60# single leg 2x10      Knee/Hip Exercises: Standing   Gait Training march with foot touch to cone 10x right/left with UE use needed    Other Standing Knee  Exercises stepping over flat cones heel touch 10x right/left    Other Standing Knee Exercises march while holding 5# kettlebell 10x in each hand                    PT Short Term Goals - 10/27/20 1301      PT SHORT TERM GOAL #2   Title Pt will improve RLE gastroc/soleus strength to be able to perform 2-3 single leg heel lifts to improve push off during gait    Baseline 0 single leg heel raise on Rt, Lt x 5    Time 4    Period Weeks    Status On-going             PT Long Term Goals - 01/01/21 1545      PT LONG TERM GOAL #1   Title Pt will demonstrate independence with final HEP    Time 8    Period Weeks    Status On-going    Target Date 01/28/21      PT LONG TERM GOAL #2   Title Pt will demonstrate ability to perform 5-6 single leg heel lifts on RLE to improve push off for gait    Status Deferred      PT LONG TERM GOAL #3   Title Pt will improve BERG balance score to >/= 51/56 to indicate decreased risk for falls    Time 8    Period Weeks    Status On-going      PT LONG TERM GOAL #4   Title Pt will negotiate 12 stairs with one rail, alternating sequence without catching either foot on stairs, MOD I    Time 8    Period Weeks    Status On-going      PT LONG TERM GOAL #5   Title Pt will demonstrate ability to stand from low chair (14") without use of UE x 5 reps in </= 12 seconds    Status Achieved      PT LONG TERM GOAL #6   Title improve strength, endurance and stability to safely walk 15 minutes at the park without need to sit and rest    Time 8    Period Weeks    Status On-going                 Plan - 01/08/21 1957    Clinical Impression Statement The patient performs moderate balance challenges  with no difficulty with quick turns and reaching  low and higher targets.  He has significant difficulty with marching/high knee lifts secondary to the single stance time and requires UE support to keep from falling.  He has less difficulty with quick  stepping to touch toe to floor targets but still needs CGA for safety.  He has increased shortness of breath particularly with standing ex's but recovers quickly often with just standing still for 20 sec.    Comorbidities allergic rhinitis, CAD s/p CABG in 1999, OA of hip, h/o MI, adenomatous colonic polyps, HLD, lumbar disc disease, R knee meniscus tear, removal of thyroid nodule, lumbar spinal stenosis, shoulder arthroscopy wtih rotator cuff repair and subacromial decompression    Examination-Participation Restrictions Community Activity    Stability/Clinical Decision Making Stable/Uncomplicated    Rehab Potential Good    PT Frequency 2x / week    PT Duration 8 weeks    PT Treatment/Interventions ADLs/Self Care Home Management;Aquatic Therapy;Electrical Stimulation;DME Instruction;Gait training;Stair training;Functional mobility training;Therapeutic activities;Therapeutic exercise;Balance training;Neuromuscular re-education;Patient/family education    PT Next Visit Plan 30th visit progress note;  balance, strength, endurance, discontinue KX for new calendar year    PT Home Exercise Plan [id number]           Patient will benefit from skilled therapeutic intervention in order to improve the following deficits and impairments:  Abnormal gait,Decreased balance,Decreased strength,Difficulty walking  Visit Diagnosis: Muscle weakness (generalized)  Repeated falls  Other abnormalities of gait and mobility  Unsteadiness on feet     Problem List Patient Active Problem List   Diagnosis Date Noted  . Dysfunction of Eustachian tube, bilateral 07/21/2020  . Gait disorder 04/20/2016  . Hyperglycemia 04/20/2016  . Abnormal CT scan, chest 06/03/2014  . Erectile dysfunction 06/03/2014  . Spinal stenosis of lumbar region 03/15/2014  . Cough 03/12/2013  . Allergic rhinitis 03/12/2013  . Leg pain, bilateral 12/30/2011  . Bladder neck obstruction 12/30/2011  . Preventative health care 12/25/2011   . Lumbar disc disease 12/25/2011  . S/P removal of thyroid nodule 12/25/2011  . History of MI (myocardial infarction) 12/25/2011  . Right knee meniscal tear 12/25/2011  . Degenerative arthritis of hip 12/25/2011  . HIP PAIN, RIGHT 05/14/2010  . FATIGUE 10/13/2009  . HYPERTENSION, BENIGN 02/25/2009  . CAD, AUTOLOGOUS BYPASS GRAFT 02/25/2009  . CORONARY ARTERY ANEURYSM 02/25/2009  . BACK PAIN 04/15/2008  . Hyperlipidemia 02/13/2008  . CORONARY ARTERY DISEASE 02/13/2008  . DIVERTICULOSIS, COLON 02/13/2008  . CARDIAC DISEASE, HX OF 02/13/2008  . COLONIC POLYPS, HX OF 02/13/2008   Ruben Im, PT 01/08/21 8:04 PM Phone: [phone] Fax: [phone] Alvera Singh 01/08/2021, 8:03 PM  [facility] [address], [place], [place], [address] Phone: [phone]   Fax:  [phone]  Name: Larry [name] MRN: [id number] Date of Birth: 24-Nov-1932

## 2021-01-13 ENCOUNTER — Other Ambulatory Visit: Payer: Self-pay

## 2021-01-13 ENCOUNTER — Ambulatory Visit: Payer: Medicare Other | Admitting: Physical Therapy

## 2021-01-13 DIAGNOSIS — M6281 Muscle weakness (generalized): Secondary | ICD-10-CM

## 2021-01-13 DIAGNOSIS — R2689 Other abnormalities of gait and mobility: Secondary | ICD-10-CM

## 2021-01-13 DIAGNOSIS — R296 Repeated falls: Secondary | ICD-10-CM

## 2021-01-13 DIAGNOSIS — R2681 Unsteadiness on feet: Secondary | ICD-10-CM

## 2021-01-13 NOTE — Therapy (Signed)
[facility] [address] [place], [place], [address] Phone: [phone]   Fax:  [phone]  Physical Therapy Treatment  Patient Details  Name: Larry [name] MRN: [id number] Date of Birth: 07/24/1932 Referring Provider (PT): Earleen Newport, MD  Progress Note Reporting Period 11/26/2020 to 01/13/2021  See note below for Objective Data and Assessment of Progress/Goals.      Encounter Date: 01/13/2021   PT End of Session - 01/13/21 1231    Visit Number 30    Date for PT Re-Evaluation 01/28/20    Authorization Type Medicare and [employer]    PT Start Time 1148    PT Stop Time 1228    PT Time Calculation (min) 40 min    Activity Tolerance Patient tolerated treatment well           Past Medical History:  Diagnosis Date  . Allergic rhinitis, cause unspecified 03/12/2013  . Allergy   . CAD (coronary artery disease)    S/P CABG in 1999   . Cataract   . Decreased exercise tolerance    Exertional fatigue  . Degenerative arthritis of hip   . Diverticulosis of colon   . ED (erectile dysfunction)   . Erectile dysfunction 06/03/2014  . History of colonic polyps   . History of kidney stones   . History of MI (myocardial infarction)   . Hx of adenomatous colonic polyps 2000  . Hyperlipidemia    Low HDL  . Lumbar disc disease    Hx of with recent back pain and buttock pain  . Myocardial infarction ([place]) 1999  . Right knee meniscal tear   . S/P removal of thyroid nodule   . Spinal stenosis of lumbar region 03/15/2014    Past Surgical History:  Procedure Laterality Date  . CARDIAC CATHETERIZATION    . CATARACT EXTRACTION    . COLONOSCOPY    . CORONARY ARTERY BYPASS GRAFT  1999  . CYSTOSCOPY/URETEROSCOPY/HOLMIUM LASER/STENT PLACEMENT Left 12/23/2017   Procedure: CYSTOSCOPY/RETROGRADE/URETEROSCOPY/HOLMIUM LASER/STENT PLACEMENT;  Surgeon: Festus Aloe, MD;  Location: [facility];  Service: Urology;  Laterality:  Left;  ONLY NEEDS 60 MIN  . LUMBAR DISC SURGERY    . Nodules on Thyroid    . POLYPECTOMY    . PTCA    . SHOULDER ARTHROSCOPY WITH ROTATOR CUFF REPAIR AND SUBACROMIAL DECOMPRESSION Right 01/30/2015   Procedure: RIGHT ARTHROSCOPY SHOULDER SUBACROMIAL DECOMPRESSION DISTAL CLAVICAL RESECTION RETATOR CUFF REPAIR;  Surgeon: Justice Britain, MD;  Location: [facility];  Service: Orthopedics;  Laterality: Right;  . TONSILLECTOMY    . VASECTOMY      There were no vitals filed for this visit.   Subjective Assessment - 01/13/21 1148    Subjective Balance pretty good over the weekend.    Pertinent History In 1964 had disc removed with residual right LE weakness    Diagnostic tests Xray demonstrated lumbar scoliosis and spondylolisthesis stable from a year ago.    Patient Stated Goals Would like to improve balance to be able to walk longer distances, strengthen R calf, be able to get out of a chair, turn and bend forward without fear of falling.    Currently in Pain? No/denies              [facility] PT Assessment - 01/13/21 0001      Berg Balance Test   Sit to Stand Able to stand without using hands and stabilize independently    Standing Unsupported Able to stand safely 2  minutes    Sitting with Back Unsupported but Feet Supported on Floor or Stool Able to sit safely and securely 2 minutes    Stand to Sit Sits safely with minimal use of hands    Transfers Able to transfer safely, minor use of hands    Standing Unsupported with Eyes Closed Able to stand 10 seconds with supervision    Standing Unsupported with Feet Together Able to place feet together independently and stand for 1 minute with supervision    From Standing, Reach Forward with Outstretched Arm Can reach confidently >25 cm (10")    From Standing Position, Pick up Object from Floor Able to pick up shoe safely and easily    From Standing Position, Turn to Look Behind Over each Shoulder Looks behind from both sides and weight shifts well    Turn  360 Degrees Needs close supervision or verbal cueing    Standing Unsupported, Alternately Place Feet on Step/Stool Able to stand independently and safely and complete 8 steps in 20 seconds    Standing Unsupported, One Foot in Front Able to plae foot ahead of the other independently and hold 30 seconds    Standing on One Leg Tries to lift leg/unable to hold 3 seconds but remains standing independently    Total Score 47                     Nu-Step 6 min L4 while discussing status/progress    [facility] Adult PT Treatment/Exercise - 01/13/21 0001      Therapeutic Activites    ADL's sit to stand; stairs, lifting, carrying       Neuro Re-ed    Neuro Re-ed Details  turn and reach color circles on floor and counter 3 rounds; 1/4 and 1/2 turns; backwards walking next to counter; step touch circles on floor multi directions without UE assist; lateral ball on wall bounces right/left and alternating      Knee/Hip Exercises: Machines for Strengthening   Total Gym Leg Press Seat 7: BLE 110#  2x10 reps, 60# single leg 2x10                    PT Short Term Goals - 10/27/20 1301      PT SHORT TERM GOAL #2   Title Pt will improve RLE gastroc/soleus strength to be able to perform 2-3 single leg heel lifts to improve push off during gait    Baseline 0 single leg heel raise on Rt, Lt x 5    Time 4    Period Weeks    Status On-going             PT Long Term Goals - 01/01/21 1545      PT LONG TERM GOAL #1   Title Pt will demonstrate independence with final HEP    Time 8    Period Weeks    Status On-going    Target Date 01/28/21      PT LONG TERM GOAL #2   Title Pt will demonstrate ability to perform 5-6 single leg heel lifts on RLE to improve push off for gait    Status Deferred      PT LONG TERM GOAL #3   Title Pt will improve BERG balance score to >/= 51/56 to indicate decreased risk for falls    Time 8    Period Weeks    Status On-going      PT LONG TERM GOAL #4  Title Pt will negotiate 12 stairs with one rail, alternating sequence without catching either foot on stairs, MOD I    Time 8    Period Weeks    Status On-going      PT LONG TERM GOAL #5   Title Pt will demonstrate ability to stand from low chair (14") without use of UE x 5 reps in </= 12 seconds    Status Achieved      PT LONG TERM GOAL #6   Title improve strength, endurance and stability to safely walk 15 minutes at the park without need to sit and rest    Time 8    Period Weeks    Status On-going                 Plan - 01/13/21 1914    Clinical Impression Statement The patient has much improved 5x sit to stand test from 12 sec initially to 9.3 sec today (no UE assist needed).  His BERG score has significantly improved as well from 41/56 initially (high risk of falls) to 47/56 (moderate risk of falls).  He is able to perform higher intensity balance challenges with 1x of physical assist needed but mostly CGA for safety.  Less postural reaction (excessive forward trunk lean) with challenges.    Comorbidities allergic rhinitis, CAD s/p CABG in 1999, OA of hip, h/o MI, adenomatous colonic polyps, HLD, lumbar disc disease, R knee meniscus tear, removal of thyroid nodule, lumbar spinal stenosis, shoulder arthroscopy wtih rotator cuff repair and subacromial decompression    Rehab Potential Good    PT Frequency 2x / week    PT Duration 8 weeks    PT Treatment/Interventions ADLs/Self Care Home Management;Aquatic Therapy;Electrical Stimulation;DME Instruction;Gait training;Stair training;Functional mobility training;Therapeutic activities;Therapeutic exercise;Balance training;Neuromuscular re-education;Patient/family education    PT Next Visit Plan balance turning tandem and staggered stand, single leg stand , strength, endurance, discontinue KX for new calendar year    PT Home Exercise Plan [email or web address]           Patient will benefit from skilled therapeutic intervention in order to  improve the following deficits and impairments:  Abnormal gait,Decreased balance,Decreased strength,Difficulty walking  Visit Diagnosis: Muscle weakness (generalized)  Repeated falls  Other abnormalities of gait and mobility  Unsteadiness on feet     Problem List Patient Active Problem List   Diagnosis Date Noted  . Dysfunction of Eustachian tube, bilateral 07/21/2020  . Gait disorder 04/20/2016  . Hyperglycemia 04/20/2016  . Abnormal CT scan, chest 06/03/2014  . Erectile dysfunction 06/03/2014  . Spinal stenosis of lumbar region 03/15/2014  . Cough 03/12/2013  . Allergic rhinitis 03/12/2013  . Leg pain, bilateral 12/30/2011  . Bladder neck obstruction 12/30/2011  . Preventative health care 12/25/2011  . Lumbar disc disease 12/25/2011  . S/P removal of thyroid nodule 12/25/2011  . History of MI (myocardial infarction) 12/25/2011  . Right knee meniscal tear 12/25/2011  . Degenerative arthritis of hip 12/25/2011  . HIP PAIN, RIGHT 05/14/2010  . FATIGUE 10/13/2009  . HYPERTENSION, BENIGN 02/25/2009  . CAD, AUTOLOGOUS BYPASS GRAFT 02/25/2009  . CORONARY ARTERY ANEURYSM 02/25/2009  . BACK PAIN 04/15/2008  . Hyperlipidemia 02/13/2008  . CORONARY ARTERY DISEASE 02/13/2008  . DIVERTICULOSIS, COLON 02/13/2008  . CARDIAC DISEASE, HX OF 02/13/2008  . COLONIC POLYPS, HX OF 02/13/2008   Ruben Im, PT 01/13/21 7:28 PM Phone: [phone] Fax: [phone] Alvera Singh 01/13/2021, 7:27 PM  [facility] [address]  [place], [place], [address] Phone: [phone]   Fax:  [phone]  Name: Kaleo Condrey MRN: [id number] Date of Birth: 09/11/1932

## 2021-01-15 ENCOUNTER — Ambulatory Visit: Payer: Medicare Other | Admitting: Physical Therapy

## 2021-01-15 ENCOUNTER — Other Ambulatory Visit: Payer: Self-pay

## 2021-01-15 DIAGNOSIS — R2681 Unsteadiness on feet: Secondary | ICD-10-CM

## 2021-01-15 DIAGNOSIS — M6281 Muscle weakness (generalized): Secondary | ICD-10-CM | POA: Diagnosis not present

## 2021-01-15 DIAGNOSIS — R2689 Other abnormalities of gait and mobility: Secondary | ICD-10-CM

## 2021-01-15 DIAGNOSIS — R296 Repeated falls: Secondary | ICD-10-CM

## 2021-01-15 NOTE — Therapy (Signed)
[facility] [address] [place], [place], [address] Phone: [phone]   Fax:  [phone]  Physical Therapy Treatment  Patient Details  Name: Larry [name] MRN: [id number] Date of Birth: May 05, 1932 Referring Provider (PT): Earleen Newport, MD   Encounter Date: 01/15/2021   PT End of Session - 01/15/21 1733    Visit Number 31    Date for PT Re-Evaluation 01/28/20    Authorization Type Medicare and [employer]    Progress Note Due on Visit 30    PT Start Time 1145    PT Stop Time 1229    PT Time Calculation (min) 44 min    Activity Tolerance Patient tolerated treatment well           Past Medical History:  Diagnosis Date  . Allergic rhinitis, cause unspecified 03/12/2013  . Allergy   . CAD (coronary artery disease)    S/P CABG in 1999   . Cataract   . Decreased exercise tolerance    Exertional fatigue  . Degenerative arthritis of hip   . Diverticulosis of colon   . ED (erectile dysfunction)   . Erectile dysfunction 06/03/2014  . History of colonic polyps   . History of kidney stones   . History of MI (myocardial infarction)   . Hx of adenomatous colonic polyps 2000  . Hyperlipidemia    Low HDL  . Lumbar disc disease    Hx of with recent back pain and buttock pain  . Myocardial infarction ([place]) 1999  . Right knee meniscal tear   . S/P removal of thyroid nodule   . Spinal stenosis of lumbar region 03/15/2014    Past Surgical History:  Procedure Laterality Date  . CARDIAC CATHETERIZATION    . CATARACT EXTRACTION    . COLONOSCOPY    . CORONARY ARTERY BYPASS GRAFT  1999  . CYSTOSCOPY/URETEROSCOPY/HOLMIUM LASER/STENT PLACEMENT Left 12/23/2017   Procedure: CYSTOSCOPY/RETROGRADE/URETEROSCOPY/HOLMIUM LASER/STENT PLACEMENT;  Surgeon: Festus Aloe, MD;  Location: [facility];  Service: Urology;  Laterality: Left;  ONLY NEEDS 60 MIN  . LUMBAR DISC SURGERY    . Nodules on Thyroid    . POLYPECTOMY    .  PTCA    . SHOULDER ARTHROSCOPY WITH ROTATOR CUFF REPAIR AND SUBACROMIAL DECOMPRESSION Right 01/30/2015   Procedure: RIGHT ARTHROSCOPY SHOULDER SUBACROMIAL DECOMPRESSION DISTAL CLAVICAL RESECTION RETATOR CUFF REPAIR;  Surgeon: Justice Britain, MD;  Location: [place];  Service: Orthopedics;  Laterality: Right;  . TONSILLECTOMY    . VASECTOMY      There were no vitals filed for this visit.   Subjective Assessment - 01/15/21 1146    Subjective Denies soreness or any difficulty after last session.  "I feel like I could get down and up off the floor if I had to."    Pertinent History In 1964 had disc removed with residual right LE weakness    Diagnostic tests Xray demonstrated lumbar scoliosis and spondylolisthesis stable from a year ago.    Patient Stated Goals Would like to improve balance to be able to walk longer distances, strengthen R calf, be able to get out of a chair, turn and bend forward without fear of falling.    Currently in Pain? No/denies                             [facility] Adult PT Treatment/Exercise - 01/15/21 0001      Therapeutic Activites  ADL's sit to stand; stairs, lifting, carrying       Neuro Re-ed    Neuro Re-ed Details  sit to stand with step and reach color circles on floor and counter 3 rounds;  step touch circles on floor multi directions without UE assist; lateral ball toss/bounce against wall with head turns      Knee/Hip Exercises: Aerobic   Nustep L4 10 min while discussing status/progress       Knee/Hip Exercises: Standing   Other Standing Knee Exercises standing on black foam with golf swing with eyes open and closed    Other Standing Knee Exercises standing on black foam with 5# kettlebell snatch to shoulder then press overhead 8x right/left      Shoulder Exercises: Standing   Other Standing Exercises counter top push ups 10x    Other Standing Exercises plank on counter with side stepping 10x                    PT Short Term  Goals - 10/27/20 1301      PT SHORT TERM GOAL #2   Title Pt will improve RLE gastroc/soleus strength to be able to perform 2-3 single leg heel lifts to improve push off during gait    Baseline 0 single leg heel raise on Rt, Lt x 5    Time 4    Period Weeks    Status On-going             PT Long Term Goals - 01/01/21 1545      PT LONG TERM GOAL #1   Title Pt will demonstrate independence with final HEP    Time 8    Period Weeks    Status On-going    Target Date 01/28/21      PT LONG TERM GOAL #2   Title Pt will demonstrate ability to perform 5-6 single leg heel lifts on RLE to improve push off for gait    Status Deferred      PT LONG TERM GOAL #3   Title Pt will improve BERG balance score to >/= 51/56 to indicate decreased risk for falls    Time 8    Period Weeks    Status On-going      PT LONG TERM GOAL #4   Title Pt will negotiate 12 stairs with one rail, alternating sequence without catching either foot on stairs, MOD I    Time 8    Period Weeks    Status On-going      PT LONG TERM GOAL #5   Title Pt will demonstrate ability to stand from low chair (14") without use of UE x 5 reps in </= 12 seconds    Status Achieved      PT LONG TERM GOAL #6   Title improve strength, endurance and stability to safely walk 15 minutes at the park without need to sit and rest    Time 8    Period Weeks    Status On-going                 Plan - 01/15/21 1733    Clinical Impression Statement The patient is performing higher level balance challenges (standing on soft surfaces with eyes closed and quick direction changes at faster speeds).  Fewer compensatory postural reactions like forward trunk lean noted since start of care.  He does have increased shortness of breath with balance challenges but quickly recovers with short standing or sitting rest breaks.  CGA and occassional min assist from therapist with eyes closed and backward stepping tasks.   Progressing very well with  balance rehab.    Comorbidities allergic rhinitis, CAD s/p CABG in 1999, OA of hip, h/o MI, adenomatous colonic polyps, HLD, lumbar disc disease, R knee meniscus tear, removal of thyroid nodule, lumbar spinal stenosis, shoulder arthroscopy wtih rotator cuff repair and subacromial decompression    Rehab Potential Good    PT Frequency 2x / week    PT Duration 8 weeks    PT Treatment/Interventions ADLs/Self Care Home Management;Aquatic Therapy;Electrical Stimulation;DME Instruction;Gait training;Stair training;Functional mobility training;Therapeutic activities;Therapeutic exercise;Balance training;Neuromuscular re-education;Patient/family education    PT Next Visit Plan balance turning tandem and staggered stand, single leg stand , strength, endurance, discontinue KX for new calendar year    PT Home Exercise Plan [email or web address]           Patient will benefit from skilled therapeutic intervention in order to improve the following deficits and impairments:  Abnormal gait,Decreased balance,Decreased strength,Difficulty walking  Visit Diagnosis: Muscle weakness (generalized)  Repeated falls  Other abnormalities of gait and mobility  Unsteadiness on feet     Problem List Patient Active Problem List   Diagnosis Date Noted  . Dysfunction of Eustachian tube, bilateral 07/21/2020  . Gait disorder 04/20/2016  . Hyperglycemia 04/20/2016  . Abnormal CT scan, chest 06/03/2014  . Erectile dysfunction 06/03/2014  . Spinal stenosis of lumbar region 03/15/2014  . Cough 03/12/2013  . Allergic rhinitis 03/12/2013  . Leg pain, bilateral 12/30/2011  . Bladder neck obstruction 12/30/2011  . Preventative health care 12/25/2011  . Lumbar disc disease 12/25/2011  . S/P removal of thyroid nodule 12/25/2011  . History of MI (myocardial infarction) 12/25/2011  . Right knee meniscal tear 12/25/2011  . Degenerative arthritis of hip 12/25/2011  . HIP PAIN, RIGHT 05/14/2010  . FATIGUE 10/13/2009  .  HYPERTENSION, BENIGN 02/25/2009  . CAD, AUTOLOGOUS BYPASS GRAFT 02/25/2009  . CORONARY ARTERY ANEURYSM 02/25/2009  . BACK PAIN 04/15/2008  . Hyperlipidemia 02/13/2008  . CORONARY ARTERY DISEASE 02/13/2008  . DIVERTICULOSIS, COLON 02/13/2008  . CARDIAC DISEASE, HX OF 02/13/2008  . COLONIC POLYPS, HX OF 02/13/2008   Ruben Im, PT 01/15/21 5:40 PM Phone: [phone] Fax: [phone] Alvera Singh 01/15/2021, 5:40 PM  [facility] [address], [place], [place], [address] Phone: [phone]   Fax:  [phone]  Name: Larry [name] MRN: [id number] Date of Birth: 11/18/1932

## 2021-01-19 ENCOUNTER — Ambulatory Visit: Payer: Medicare Other

## 2021-01-19 ENCOUNTER — Other Ambulatory Visit: Payer: Self-pay

## 2021-01-19 DIAGNOSIS — M6281 Muscle weakness (generalized): Secondary | ICD-10-CM | POA: Diagnosis not present

## 2021-01-19 DIAGNOSIS — R2689 Other abnormalities of gait and mobility: Secondary | ICD-10-CM

## 2021-01-19 DIAGNOSIS — R296 Repeated falls: Secondary | ICD-10-CM

## 2021-01-19 DIAGNOSIS — R2681 Unsteadiness on feet: Secondary | ICD-10-CM

## 2021-01-19 NOTE — Therapy (Signed)
[facility] [address], [place], [place], [address] Phone: [phone]   Fax:  [phone]  Physical Therapy Treatment  Patient Details  Name: Larry [name] MRN: [id number] Date of Birth: Nov 16, 1932 Referring Provider (PT): Earleen Newport, MD   Encounter Date: 01/19/2021   PT End of Session - 01/19/21 1443    Visit Number 32    Date for PT Re-Evaluation 01/28/20    Authorization Type Medicare and [employer]    Progress Note Due on Visit 40    PT Start Time 1357    PT Stop Time 1439    PT Time Calculation (min) 42 min    Activity Tolerance Patient tolerated treatment well    Behavior During Therapy [facility] for tasks assessed/performed           Past Medical History:  Diagnosis Date  . Allergic rhinitis, cause unspecified 03/12/2013  . Allergy   . CAD (coronary artery disease)    S/P CABG in 1999   . Cataract   . Decreased exercise tolerance    Exertional fatigue  . Degenerative arthritis of hip   . Diverticulosis of colon   . ED (erectile dysfunction)   . Erectile dysfunction 06/03/2014  . History of colonic polyps   . History of kidney stones   . History of MI (myocardial infarction)   . Hx of adenomatous colonic polyps 2000  . Hyperlipidemia    Low HDL  . Lumbar disc disease    Hx of with recent back pain and buttock pain  . Myocardial infarction ([place]) 1999  . Right knee meniscal tear   . S/P removal of thyroid nodule   . Spinal stenosis of lumbar region 03/15/2014    Past Surgical History:  Procedure Laterality Date  . CARDIAC CATHETERIZATION    . CATARACT EXTRACTION    . COLONOSCOPY    . CORONARY ARTERY BYPASS GRAFT  1999  . CYSTOSCOPY/URETEROSCOPY/HOLMIUM LASER/STENT PLACEMENT Left 12/23/2017   Procedure: CYSTOSCOPY/RETROGRADE/URETEROSCOPY/HOLMIUM LASER/STENT PLACEMENT;  Surgeon: Festus Aloe, MD;  Location: [facility];  Service: Urology;  Laterality: Left;  ONLY NEEDS 60 MIN  . LUMBAR  DISC SURGERY    . Nodules on Thyroid    . POLYPECTOMY    . PTCA    . SHOULDER ARTHROSCOPY WITH ROTATOR CUFF REPAIR AND SUBACROMIAL DECOMPRESSION Right 01/30/2015   Procedure: RIGHT ARTHROSCOPY SHOULDER SUBACROMIAL DECOMPRESSION DISTAL CLAVICAL RESECTION RETATOR CUFF REPAIR;  Surgeon: Justice Britain, MD;  Location: [facility];  Service: Orthopedics;  Laterality: Right;  . TONSILLECTOMY    . VASECTOMY      There were no vitals filed for this visit.   Subjective Assessment - 01/19/21 1401    Subjective I am getting stronger.    Currently in Pain? No/denies                             [facility] Adult PT Treatment/Exercise - 01/19/21 0001      Therapeutic Activites    ADL's sit to stand; stairs, lifting, carrying       Neuro Re-ed    Neuro Re-ed Details  sit to stand with step tap on foam pads Rt, middle, Lt and reach to wall above each pad Rt, middle, Lt ; lateral ball toss/bounce against wall with head turns      Knee/Hip Exercises: Aerobic   Nustep L4 10 min while discussing status/progress       Knee/Hip Exercises: Machines  for Strengthening   Total Gym Leg Press Seat 7: BLE 110#  2x10 reps, 60# single leg 2x10      Knee/Hip Exercises: Standing   Other Standing Knee Exercises standing on black pad with alternating reaching across body- verbal cues to return to neutral before next reach.  Alternating step taps on 6" step standing on black pad                    PT Short Term Goals - 10/27/20 1301      PT SHORT TERM GOAL #2   Title Pt will improve RLE gastroc/soleus strength to be able to perform 2-3 single leg heel lifts to improve push off during gait    Baseline 0 single leg heel raise on Rt, Lt x 5    Time 4    Period Weeks    Status On-going             PT Long Term Goals - 01/01/21 1545      PT LONG TERM GOAL #1   Title Pt will demonstrate independence with final HEP    Time 8    Period Weeks    Status On-going    Target Date 01/28/21       PT LONG TERM GOAL #2   Title Pt will demonstrate ability to perform 5-6 single leg heel lifts on RLE to improve push off for gait    Status Deferred      PT LONG TERM GOAL #3   Title Pt will improve BERG balance score to >/= 51/56 to indicate decreased risk for falls    Time 8    Period Weeks    Status On-going      PT LONG TERM GOAL #4   Title Pt will negotiate 12 stairs with one rail, alternating sequence without catching either foot on stairs, MOD I    Time 8    Period Weeks    Status On-going      PT LONG TERM GOAL #5   Title Pt will demonstrate ability to stand from low chair (14") without use of UE x 5 reps in </= 12 seconds    Status Achieved      PT LONG TERM GOAL #6   Title improve strength, endurance and stability to safely walk 15 minutes at the park without need to sit and rest    Time 8    Period Weeks    Status On-going                 Plan - 01/19/21 1422    Clinical Impression Statement The patient is performing higher level balance challenges (standing on soft surfaces, change of directions and reaching with feet and hands.  Fewer compensatory postural reactions like forward trunk lean noted since start of care.   He does have increased shortness of breath with balance challenges but quickly recovers with short standing or sitting rest breaks.  CGA and occasional min assist from therapist with eyes closed and backward stepping tasks.   Pt was able to get up/down from the floor at home last weekend.  Pt is making good progress yet slow related to chronicity of condition.    Rehab Potential Good    PT Frequency 2x / week    PT Duration 8 weeks    PT Treatment/Interventions ADLs/Self Care Home Management;Aquatic Therapy;Electrical Stimulation;DME Instruction;Gait training;Stair training;Functional mobility training;Therapeutic activities;Therapeutic exercise;Balance training;Neuromuscular re-education;Patient/family education    PT Next Visit Plan  balance  turning tandem and staggered stand, single leg stand , strength, endurance, discontinue KX for new calendar year    PT Home Exercise Plan [email or web address]    Consulted and Agree with Plan of Care Patient           Patient will benefit from skilled therapeutic intervention in order to improve the following deficits and impairments:  Abnormal gait,Decreased balance,Decreased strength,Difficulty walking  Visit Diagnosis: Muscle weakness (generalized)  Repeated falls  Other abnormalities of gait and mobility  Unsteadiness on feet     Problem List Patient Active Problem List   Diagnosis Date Noted  . Dysfunction of Eustachian tube, bilateral 07/21/2020  . Gait disorder 04/20/2016  . Hyperglycemia 04/20/2016  . Abnormal CT scan, chest 06/03/2014  . Erectile dysfunction 06/03/2014  . Spinal stenosis of lumbar region 03/15/2014  . Cough 03/12/2013  . Allergic rhinitis 03/12/2013  . Leg pain, bilateral 12/30/2011  . Bladder neck obstruction 12/30/2011  . Preventative health care 12/25/2011  . Lumbar disc disease 12/25/2011  . S/P removal of thyroid nodule 12/25/2011  . History of MI (myocardial infarction) 12/25/2011  . Right knee meniscal tear 12/25/2011  . Degenerative arthritis of hip 12/25/2011  . HIP PAIN, RIGHT 05/14/2010  . FATIGUE 10/13/2009  . HYPERTENSION, BENIGN 02/25/2009  . CAD, AUTOLOGOUS BYPASS GRAFT 02/25/2009  . CORONARY ARTERY ANEURYSM 02/25/2009  . BACK PAIN 04/15/2008  . Hyperlipidemia 02/13/2008  . CORONARY ARTERY DISEASE 02/13/2008  . DIVERTICULOSIS, COLON 02/13/2008  . CARDIAC DISEASE, HX OF 02/13/2008  . COLONIC POLYPS, HX OF 02/13/2008    Sigurd Sos, PT 01/19/21 2:45 PM  [facility] [address], [place], [place], [address] Phone: [phone]   Fax:  [phone]  Name: Lior Hoen MRN: [id number] Date of Birth: 02-09-1932

## 2021-01-21 ENCOUNTER — Ambulatory Visit: Payer: Medicare Other | Attending: Neurological Surgery

## 2021-01-21 ENCOUNTER — Other Ambulatory Visit: Payer: Self-pay

## 2021-01-21 DIAGNOSIS — R2689 Other abnormalities of gait and mobility: Secondary | ICD-10-CM

## 2021-01-21 DIAGNOSIS — R296 Repeated falls: Secondary | ICD-10-CM

## 2021-01-21 DIAGNOSIS — R2681 Unsteadiness on feet: Secondary | ICD-10-CM | POA: Diagnosis not present

## 2021-01-21 DIAGNOSIS — M6281 Muscle weakness (generalized): Secondary | ICD-10-CM | POA: Diagnosis not present

## 2021-01-21 NOTE — Therapy (Signed)
[facility] [address] [place], [place], [address] Phone: [phone]   Fax:  [phone]  Physical Therapy Treatment  Patient Details  Name: Larry [name] MRN: [id number] Date of Birth: 1931-12-26 Referring Provider (PT): Earleen Newport, MD   Encounter Date: 01/21/2021   PT End of Session - 01/21/21 1433    Visit Number 33    Date for PT Re-Evaluation 01/28/20    Authorization Type Medicare and [employer]- visit 7 for 2022    Progress Note Due on Visit 3    PT Start Time 1402    PT Stop Time 1444    PT Time Calculation (min) 42 min    Equipment Utilized During Treatment Gait belt    Activity Tolerance Patient tolerated treatment well    Behavior During Therapy [facility] for tasks assessed/performed           Past Medical History:  Diagnosis Date  . Allergic rhinitis, cause unspecified 03/12/2013  . Allergy   . CAD (coronary artery disease)    S/P CABG in 1999   . Cataract   . Decreased exercise tolerance    Exertional fatigue  . Degenerative arthritis of hip   . Diverticulosis of colon   . ED (erectile dysfunction)   . Erectile dysfunction 06/03/2014  . History of colonic polyps   . History of kidney stones   . History of MI (myocardial infarction)   . Hx of adenomatous colonic polyps 2000  . Hyperlipidemia    Low HDL  . Lumbar disc disease    Hx of with recent back pain and buttock pain  . Myocardial infarction ([place]) 1999  . Right knee meniscal tear   . S/P removal of thyroid nodule   . Spinal stenosis of lumbar region 03/15/2014    Past Surgical History:  Procedure Laterality Date  . CARDIAC CATHETERIZATION    . CATARACT EXTRACTION    . COLONOSCOPY    . CORONARY ARTERY BYPASS GRAFT  1999  . CYSTOSCOPY/URETEROSCOPY/HOLMIUM LASER/STENT PLACEMENT Left 12/23/2017   Procedure: CYSTOSCOPY/RETROGRADE/URETEROSCOPY/HOLMIUM LASER/STENT PLACEMENT;  Surgeon: Festus Aloe, MD;  Location: [facility];   Service: Urology;  Laterality: Left;  ONLY NEEDS 60 MIN  . LUMBAR DISC SURGERY    . Nodules on Thyroid    . POLYPECTOMY    . PTCA    . SHOULDER ARTHROSCOPY WITH ROTATOR CUFF REPAIR AND SUBACROMIAL DECOMPRESSION Right 01/30/2015   Procedure: RIGHT ARTHROSCOPY SHOULDER SUBACROMIAL DECOMPRESSION DISTAL CLAVICAL RESECTION RETATOR CUFF REPAIR;  Surgeon: Justice Britain, MD;  Location: [facility];  Service: Orthopedics;  Laterality: Right;  . TONSILLECTOMY    . VASECTOMY      There were no vitals filed for this visit.   Subjective Assessment - 01/21/21 1401    Subjective Doing good today.  Felt good after last session.    Currently in Pain? No/denies                             [facility] Adult PT Treatment/Exercise - 01/21/21 0001      Therapeutic Activites    ADL's sit to stand; stairs, lifting, carrying       Neuro Re-ed    Neuro Re-ed Details  sit to stand with step tap on foam pads Rt, middle, Lt and reach to wall above each pad Rt, middle, Lt ; lateral ball toss/bounce against wall with head turns      Knee/Hip Exercises: Aerobic  Nustep L4 10 min while discussing status/progress       Knee/Hip Exercises: Machines for Strengthening   Total Gym Leg Press Seat 7: BLE 110#  2x10 reps, 60# single leg 2x10      Knee/Hip Exercises: Standing   Other Standing Knee Exercises standing on black pad with alternating reaching across body- verbal cues to return to neutral before next reach.  Alternating step taps on 6" step standing on black pad    Other Standing Knee Exercises standing on black foam with 5# kettlebell snatch to shoulder then press overhead 8x right/left                    PT Short Term Goals - 10/27/20 1301      PT SHORT TERM GOAL #2   Title Pt will improve RLE gastroc/soleus strength to be able to perform 2-3 single leg heel lifts to improve push off during gait    Baseline 0 single leg heel raise on Rt, Lt x 5    Time 4    Period Weeks    Status  On-going             PT Long Term Goals - 01/01/21 1545      PT LONG TERM GOAL #1   Title Pt will demonstrate independence with final HEP    Time 8    Period Weeks    Status On-going    Target Date 01/28/21      PT LONG TERM GOAL #2   Title Pt will demonstrate ability to perform 5-6 single leg heel lifts on RLE to improve push off for gait    Status Deferred      PT LONG TERM GOAL #3   Title Pt will improve BERG balance score to >/= 51/56 to indicate decreased risk for falls    Time 8    Period Weeks    Status On-going      PT LONG TERM GOAL #4   Title Pt will negotiate 12 stairs with one rail, alternating sequence without catching either foot on stairs, MOD I    Time 8    Period Weeks    Status On-going      PT LONG TERM GOAL #5   Title Pt will demonstrate ability to stand from low chair (14") without use of UE x 5 reps in </= 12 seconds    Status Achieved      PT LONG TERM GOAL #6   Title improve strength, endurance and stability to safely walk 15 minutes at the park without need to sit and rest    Time 8    Period Weeks    Status On-going                 Plan - 01/21/21 1408    Clinical Impression Statement The patient is performing higher level balance challenges (standing on soft surfaces, change of directions and reaching with feet and hands).  Pt is able to adjust independently and doesn't use as many compensatory motions with trunk.    He does have increased shortness of breath with balance challenges but quickly recovers with short standing or sitting rest breaks.  CGA and occasional min assist from therapist with higher level tasks.   Pt was able to get up/down from the floor at home last weekend.  Pt is making good progress yet slow related to chronicity of condition.    PT Frequency 2x / week  PT Duration 8 weeks    PT Treatment/Interventions ADLs/Self Care Home Management;Aquatic Therapy;Electrical Stimulation;DME Instruction;Gait training;Stair  training;Functional mobility training;Therapeutic activities;Therapeutic exercise;Balance training;Neuromuscular re-education;Patient/family education    PT Next Visit Plan balance turning tandem and staggered stand, single leg stand , KX later this year    PT Home Exercise Plan [email or web address]    Consulted and Agree with Plan of Care Patient           Patient will benefit from skilled therapeutic intervention in order to improve the following deficits and impairments:  Abnormal gait,Decreased balance,Decreased strength,Difficulty walking  Visit Diagnosis: Muscle weakness (generalized)  Repeated falls  Other abnormalities of gait and mobility  Unsteadiness on feet     Problem List Patient Active Problem List   Diagnosis Date Noted  . Dysfunction of Eustachian tube, bilateral 07/21/2020  . Gait disorder 04/20/2016  . Hyperglycemia 04/20/2016  . Abnormal CT scan, chest 06/03/2014  . Erectile dysfunction 06/03/2014  . Spinal stenosis of lumbar region 03/15/2014  . Cough 03/12/2013  . Allergic rhinitis 03/12/2013  . Leg pain, bilateral 12/30/2011  . Bladder neck obstruction 12/30/2011  . Preventative health care 12/25/2011  . Lumbar disc disease 12/25/2011  . S/P removal of thyroid nodule 12/25/2011  . History of MI (myocardial infarction) 12/25/2011  . Right knee meniscal tear 12/25/2011  . Degenerative arthritis of hip 12/25/2011  . HIP PAIN, RIGHT 05/14/2010  . FATIGUE 10/13/2009  . HYPERTENSION, BENIGN 02/25/2009  . CAD, AUTOLOGOUS BYPASS GRAFT 02/25/2009  . CORONARY ARTERY ANEURYSM 02/25/2009  . BACK PAIN 04/15/2008  . Hyperlipidemia 02/13/2008  . CORONARY ARTERY DISEASE 02/13/2008  . DIVERTICULOSIS, COLON 02/13/2008  . CARDIAC DISEASE, HX OF 02/13/2008  . COLONIC POLYPS, HX OF 02/13/2008     Sigurd Sos, PT 01/21/21 2:35 PM  [facility] [address] [place], [place], [address] Phone: [phone]    Fax:  [phone]  Name: Larry [name] MRN: [id number] Date of Birth: Jun 12, 1932

## 2021-01-26 ENCOUNTER — Other Ambulatory Visit: Payer: Self-pay

## 2021-01-26 ENCOUNTER — Ambulatory Visit: Payer: Medicare Other

## 2021-01-26 DIAGNOSIS — M6281 Muscle weakness (generalized): Secondary | ICD-10-CM

## 2021-01-26 DIAGNOSIS — R296 Repeated falls: Secondary | ICD-10-CM

## 2021-01-26 DIAGNOSIS — R2689 Other abnormalities of gait and mobility: Secondary | ICD-10-CM

## 2021-01-26 DIAGNOSIS — R2681 Unsteadiness on feet: Secondary | ICD-10-CM

## 2021-01-26 NOTE — Therapy (Signed)
[facility] [address] [place], [place], [address] Phone: [phone]   Fax:  [phone]  Physical Therapy Treatment  Patient Details  Name: Larry [name] MRN: [id number] Date of Birth: February 20, 1932 Referring Provider (PT): Earleen Newport, MD   Encounter Date: 01/26/2021   PT End of Session - 01/26/21 1141    Visit Number 34    Date for PT Re-Evaluation 03/23/21    Authorization Type Medicare and [employer]- visit 8 for 2022  ($2110 is threshold for KX- keep an eye on this)    Progress Note Due on Visit 40    PT Start Time 1100    PT Stop Time 1144    PT Time Calculation (min) 44 min    Equipment Utilized During Treatment Gait belt    Activity Tolerance Patient tolerated treatment well    Behavior During Therapy [facility] for tasks assessed/performed           Past Medical History:  Diagnosis Date  . Allergic rhinitis, cause unspecified 03/12/2013  . Allergy   . CAD (coronary artery disease)    S/P CABG in 1999   . Cataract   . Decreased exercise tolerance    Exertional fatigue  . Degenerative arthritis of hip   . Diverticulosis of colon   . ED (erectile dysfunction)   . Erectile dysfunction 06/03/2014  . History of colonic polyps   . History of kidney stones   . History of MI (myocardial infarction)   . Hx of adenomatous colonic polyps 2000  . Hyperlipidemia    Low HDL  . Lumbar disc disease    Hx of with recent back pain and buttock pain  . Myocardial infarction ([place]) 1999  . Right knee meniscal tear   . S/P removal of thyroid nodule   . Spinal stenosis of lumbar region 03/15/2014    Past Surgical History:  Procedure Laterality Date  . CARDIAC CATHETERIZATION    . CATARACT EXTRACTION    . COLONOSCOPY    . CORONARY ARTERY BYPASS GRAFT  1999  . CYSTOSCOPY/URETEROSCOPY/HOLMIUM LASER/STENT PLACEMENT Left 12/23/2017   Procedure: CYSTOSCOPY/RETROGRADE/URETEROSCOPY/HOLMIUM LASER/STENT PLACEMENT;   Surgeon: Festus Aloe, MD;  Location: [facility];  Service: Urology;  Laterality: Left;  ONLY NEEDS 60 MIN  . LUMBAR DISC SURGERY    . Nodules on Thyroid    . POLYPECTOMY    . PTCA    . SHOULDER ARTHROSCOPY WITH ROTATOR CUFF REPAIR AND SUBACROMIAL DECOMPRESSION Right 01/30/2015   Procedure: RIGHT ARTHROSCOPY SHOULDER SUBACROMIAL DECOMPRESSION DISTAL CLAVICAL RESECTION RETATOR CUFF REPAIR;  Surgeon: Justice Britain, MD;  Location: [facility];  Service: Orthopedics;  Laterality: Right;  . TONSILLECTOMY    . VASECTOMY      There were no vitals filed for this visit.       [facility] PT Assessment - 01/26/21 0001      Assessment   Medical Diagnosis Balance impairments, fall    Referring Provider (PT) Earleen Newport, MD    Onset Date/Surgical Date 05/30/20      Home Environment   Living Environment Private residence      Prior Function   Level of Independence Independent    Vocation Retired    Leisure Doesn't use cane in the house, uses outside when he walks at the park.  Rides stationary bike upstairs in condo      Cognition   Overall Cognitive Status Within Functional Limits for tasks assessed      Transfers  Five time sit to stand comments  7.66 seconds- improved      Balance   Balance Assessed Yes      Standardized Balance Assessment   Five times sit to stand comments  7.66 seconds      Berg Balance Test   Standing Unsupported Able to stand safely 2 minutes    Sitting with Back Unsupported but Feet Supported on Floor or Stool Able to sit safely and securely 2 minutes    Stand to Sit Sits safely with minimal use of hands    Transfers Able to transfer safely, minor use of hands    Standing Unsupported with Eyes Closed Able to stand 10 seconds with supervision    Standing Unsupported with Feet Together Able to place feet together independently and stand for 1 minute with supervision    From Standing, Reach Forward with Outstretched Arm Can reach confidently >25 cm (10")    From  Standing Position, Pick up Object from Floor Able to pick up shoe safely and easily    From Standing Position, Turn to Look Behind Over each Shoulder Looks behind from both sides and weight shifts well    Turn 360 Degrees Needs close supervision or verbal cueing    Standing Unsupported, Alternately Place Feet on Step/Stool Able to stand independently and safely and complete 8 steps in 20 seconds    Standing Unsupported, One Foot in Front Able to plae foot ahead of the other independently and hold 30 seconds    Standing on One Leg Tries to lift leg/unable to hold 3 seconds but remains standing independently    Berg comment: 47/56- falls risk      Dynamic Gait Index   Level Surface Mild Impairment    Change in Gait Speed Mild Impairment    Gait with Horizontal Head Turns Mild Impairment    Gait with Vertical Head Turns Mild Impairment    Gait and Pivot Turn Mild Impairment    Step Over Obstacle Mild Impairment    Step Around Obstacles Normal    Steps Mild Impairment    Total Score 17      High Level Balance   High Level Balance Comments ABC: 84% (1345 raw score)                         [facility] Adult PT Treatment/Exercise - 01/26/21 0001      Neuro Re-ed    Neuro Re-ed Details  standing with ball toss on walls with gait belt 2x10      Knee/Hip Exercises: Aerobic   Nustep L4 10 min while discussing status/progress       Knee/Hip Exercises: Machines for Strengthening   Total Gym Leg Press Seat 7: BLE 110#  2x10 reps, 60# single leg 2x10      Knee/Hip Exercises: Standing   Other Standing Knee Exercises standing on black pad alternating step taps on 6" step standing on black pad      Knee/Hip Exercises: Seated   Sit to Sand 20 reps;without UE support   10# kettlebell                   PT Short Term Goals - 10/27/20 1301      PT SHORT TERM GOAL #2   Title Pt will improve RLE gastroc/soleus strength to be able to perform 2-3 single leg heel lifts to improve  push off during gait    Baseline 0 single leg heel raise  on Rt, Lt x 5    Time 4    Period Weeks    Status On-going             PT Long Term Goals - 01/26/21 1105      PT LONG TERM GOAL #1   Title Pt will demonstrate independence with final HEP    Time 8    Period Weeks    Status On-going    Target Date 03/23/21      PT LONG TERM GOAL #2   Title improve ABC to > or = to 90% to improve community ambulation and safety    Baseline 84%    Time 8    Period Weeks    Status On-going    Target Date 03/23/21      PT LONG TERM GOAL #3   Title Pt will improve BERG balance score to >/= 51/56 to indicate decreased risk for falls    Baseline 47/56-significant to high falls risk    Time 8    Period Weeks    Status On-going    Target Date 03/23/21      PT LONG TERM GOAL #4   Title Pt will negotiate 12 stairs with one rail, alternating sequence without catching either foot on stairs, MOD I    Baseline no longer occurring regularly    Status Achieved      PT LONG TERM GOAL #5   Title Pt will demonstrate ability to stand from low chair (14") without use of UE x 5 reps in </= 12 seconds    Baseline 7.66 seconds    Status Achieved      PT LONG TERM GOAL #6   Title improve strength, endurance and stability to safely walk 20-25  minutes at the park without need to sit and rest to improve community independence    Baseline 15    Time 8    Period Weeks    Status Revised    Target Date 03/23/21                 Plan - 01/26/21 1134    Clinical Impression Statement The patient is performing higher level balance challenges (standing on soft surfaces, change of directions and reaching with feet and hands).  Pt is able to adjust independently and doesn't use as many compensatory motions with trunk.  CGA and occasional min assist from therapist with higher level tasks.   Pt was able to get up/down from the floor without assistance from furniture now indicating improved functional  strength.  Step length remains shortened with flexed trunk.  5x sit to stand is 7.66 seconds today indicating reduced falls risk although Berg and DGI indicate a continued falls risk.   Pt is making good progress yet slow related to chronicity of condition.  Pt will reduce to 1x/wk and pt will continue to benefit from skilled PT to improve safety at home and in the community.    Rehab Potential Good    PT Frequency 1x / week    PT Duration 8 weeks    PT Treatment/Interventions ADLs/Self Care Home Management;Aquatic Therapy;Electrical Stimulation;DME Instruction;Gait training;Stair training;Functional mobility training;Therapeutic activities;Therapeutic exercise;Balance training;Neuromuscular re-education;Patient/family education    PT Next Visit Plan balance turning tandem and staggered stand, single leg stand , KX later this year    Recommended Other Services recert sent 08/22/78    Consulted and Agree with Plan of Care Patient  Patient will benefit from skilled therapeutic intervention in order to improve the following deficits and impairments:  Abnormal gait,Decreased balance,Decreased strength,Difficulty walking  Visit Diagnosis: Muscle weakness (generalized) - Plan: PT plan of care cert/re-cert  Repeated falls - Plan: PT plan of care cert/re-cert  Other abnormalities of gait and mobility - Plan: PT plan of care cert/re-cert  Unsteadiness on feet - Plan: PT plan of care cert/re-cert     Problem List Patient Active Problem List   Diagnosis Date Noted  . Dysfunction of Eustachian tube, bilateral 07/21/2020  . Gait disorder 04/20/2016  . Hyperglycemia 04/20/2016  . Abnormal CT scan, chest 06/03/2014  . Erectile dysfunction 06/03/2014  . Spinal stenosis of lumbar region 03/15/2014  . Cough 03/12/2013  . Allergic rhinitis 03/12/2013  . Leg pain, bilateral 12/30/2011  . Bladder neck obstruction 12/30/2011  . Preventative health care 12/25/2011  . Lumbar disc disease  12/25/2011  . S/P removal of thyroid nodule 12/25/2011  . History of MI (myocardial infarction) 12/25/2011  . Right knee meniscal tear 12/25/2011  . Degenerative arthritis of hip 12/25/2011  . HIP PAIN, RIGHT 05/14/2010  . FATIGUE 10/13/2009  . HYPERTENSION, BENIGN 02/25/2009  . CAD, AUTOLOGOUS BYPASS GRAFT 02/25/2009  . CORONARY ARTERY ANEURYSM 02/25/2009  . BACK PAIN 04/15/2008  . Hyperlipidemia 02/13/2008  . CORONARY ARTERY DISEASE 02/13/2008  . DIVERTICULOSIS, COLON 02/13/2008  . CARDIAC DISEASE, HX OF 02/13/2008  . COLONIC POLYPS, HX OF 02/13/2008     Lorrene Reid, PT 01/26/21 11:45 AM  [facility] [address] [place], [place], [address] Phone: [phone]   Fax:  [phone]  Name: Larry [name] MRN: [id number] Date of Birth: 03/26/1932

## 2021-01-28 ENCOUNTER — Other Ambulatory Visit: Payer: Self-pay

## 2021-01-28 ENCOUNTER — Ambulatory Visit (INDEPENDENT_AMBULATORY_CARE_PROVIDER_SITE_OTHER): Payer: Medicare Other

## 2021-01-28 DIAGNOSIS — Z23 Encounter for immunization: Secondary | ICD-10-CM | POA: Diagnosis not present

## 2021-01-28 DIAGNOSIS — Z0000 Encounter for general adult medical examination without abnormal findings: Secondary | ICD-10-CM

## 2021-01-28 NOTE — Patient Instructions (Signed)
Larry [name] , Thank you for taking time to come for your Medicare Wellness Visit. I appreciate your ongoing commitment to your health goals. Please review the following plan we discussed and let me know if I can assist you in the future.   Screening recommendations/referrals: Colonoscopy: no repeat due to age Recommended yearly ophthalmology/optometry visit for glaucoma screening and checkup Recommended yearly dental visit for hygiene and checkup  Vaccinations: Influenza vaccine: 09/30/2020 Pneumococcal vaccine: 03/15/2014, 01/28/2021 Tdap vaccine: due until 2024 Shingles vaccine: up to date   Covid-19: up to date  Advanced directives: Please bring a copy of your health care power of attorney and living will to the office at your convenience.  Conditions/risks identified: Yes; Reviewed health maintenance screenings with patient today and relevant education, vaccines, and/or referrals were provided. Please continue to do your personal lifestyle choices by: daily care of teeth and gums, regular physical activity (goal should be 5 days a week for 30 minutes), eat a healthy diet, avoid tobacco and drug use, limiting any alcohol intake, taking a low-dose aspirin (if not allergic or have been advised by your provider otherwise) and taking vitamins and minerals as recommended by your provider. Continue doing brain stimulating activities (puzzles, reading, adult coloring books, staying active) to keep memory sharp. Continue to eat heart healthy diet (full of fruits, vegetables, whole grains, lean protein, water--limit salt, fat, and sugar intake) and increase physical activity as tolerated.  Next appointment: Please schedule your next Medicare Wellness Visit with your Nurse Health Advisor in 1 year by calling [phone].  Preventive Care 23 Years and Older, Male Preventive care refers to lifestyle choices and visits with your health care provider that can promote health and wellness. What does  preventive care include?  A yearly physical exam. This is also called an annual well check.  Dental exams once or twice a year.  Routine eye exams. Ask your health care provider how often you should have your eyes checked.  Personal lifestyle choices, including:  Daily care of your teeth and gums.  Regular physical activity.  Eating a healthy diet.  Avoiding tobacco and drug use.  Limiting alcohol use.  Practicing safe sex.  Taking low doses of aspirin every day.  Taking vitamin and mineral supplements as recommended by your health care provider. What happens during an annual well check? The services and screenings done by your health care provider during your annual well check will depend on your age, overall health, lifestyle risk factors, and family history of disease. Counseling  Your health care provider may ask you questions about your:  Alcohol use.  Tobacco use.  Drug use.  Emotional well-being.  Home and relationship well-being.  Sexual activity.  Eating habits.  History of falls.  Memory and ability to understand (cognition).  Work and work Statistician. Screening  You may have the following tests or measurements:  Height, weight, and BMI.  Blood pressure.  Lipid and cholesterol levels. These may be checked every 5 years, or more frequently if you are over 9 years old.  Skin check.  Lung cancer screening. You may have this screening every year starting at age 42 if you have a 30-pack-year history of smoking and currently smoke or have quit within the past 15 years.  Fecal occult blood test (FOBT) of the stool. You may have this test every year starting at age 67.  Flexible sigmoidoscopy or colonoscopy. You may have a sigmoidoscopy every 5 years or a colonoscopy every 10 years starting at  age 6.  Prostate cancer screening. Recommendations will vary depending on your family history and other risks.  Hepatitis C blood test.  Hepatitis B  blood test.  Sexually transmitted disease (STD) testing.  Diabetes screening. This is done by checking your blood sugar (glucose) after you have not eaten for a while (fasting). You may have this done every 1-3 years.  Abdominal aortic aneurysm (AAA) screening. You may need this if you are a current or former smoker.  Osteoporosis. You may be screened starting at age 73 if you are at high risk. Talk with your health care provider about your test results, treatment options, and if necessary, the need for more tests. Vaccines  Your health care provider may recommend certain vaccines, such as:  Influenza vaccine. This is recommended every year.  Tetanus, diphtheria, and acellular pertussis (Tdap, Td) vaccine. You may need a Td booster every 10 years.  Zoster vaccine. You may need this after age 21.  Pneumococcal 13-valent conjugate (PCV13) vaccine. One dose is recommended after age 47.  Pneumococcal polysaccharide (PPSV23) vaccine. One dose is recommended after age 80. Talk to your health care provider about which screenings and vaccines you need and how often you need them. This information is not intended to replace advice given to you by your health care provider. Make sure you discuss any questions you have with your health care provider. Document Released: 01/02/2016 Document Revised: 08/25/2016 Document Reviewed: 10/07/2015 Elsevier Interactive Patient Education  2017 [place] Prevention in the Home Falls can cause injuries. They can happen to people of all ages. There are many things you can do to make your home safe and to help prevent falls. What can I do on the outside of my home?  Regularly fix the edges of walkways and driveways and fix any cracks.  Remove anything that might make you trip as you walk through a door, such as a raised step or threshold.  Trim any bushes or trees on the path to your home.  Use bright outdoor lighting.  Clear any walking paths  of anything that might make someone trip, such as rocks or tools.  Regularly check to see if handrails are loose or broken. Make sure that both sides of any steps have handrails.  Any raised decks and porches should have guardrails on the edges.  Have any leaves, snow, or ice cleared regularly.  Use sand or salt on walking paths during winter.  Clean up any spills in your garage right away. This includes oil or grease spills. What can I do in the bathroom?  Use night lights.  Install grab bars by the toilet and in the tub and shower. Do not use towel bars as grab bars.  Use non-skid mats or decals in the tub or shower.  If you need to sit down in the shower, use a plastic, non-slip stool.  Keep the floor dry. Clean up any water that spills on the floor as soon as it happens.  Remove soap buildup in the tub or shower regularly.  Attach bath mats securely with double-sided non-slip rug tape.  Do not have throw rugs and other things on the floor that can make you trip. What can I do in the bedroom?  Use night lights.  Make sure that you have a light by your bed that is easy to reach.  Do not use any sheets or blankets that are too big for your bed. They should not hang down onto the  floor.  Have a firm chair that has side arms. You can use this for support while you get dressed.  Do not have throw rugs and other things on the floor that can make you trip. What can I do in the kitchen?  Clean up any spills right away.  Avoid walking on wet floors.  Keep items that you use a lot in easy-to-reach places.  If you need to reach something above you, use a strong step stool that has a grab bar.  Keep electrical cords out of the way.  Do not use floor polish or wax that makes floors slippery. If you must use wax, use non-skid floor wax.  Do not have throw rugs and other things on the floor that can make you trip. What can I do with my stairs?  Do not leave any items on  the stairs.  Make sure that there are handrails on both sides of the stairs and use them. Fix handrails that are broken or loose. Make sure that handrails are as long as the stairways.  Check any carpeting to make sure that it is firmly attached to the stairs. Fix any carpet that is loose or worn.  Avoid having throw rugs at the top or bottom of the stairs. If you do have throw rugs, attach them to the floor with carpet tape.  Make sure that you have a light switch at the top of the stairs and the bottom of the stairs. If you do not have them, ask someone to add them for you. What else can I do to help prevent falls?  Wear shoes that:  Do not have high heels.  Have rubber bottoms.  Are comfortable and fit you well.  Are closed at the toe. Do not wear sandals.  If you use a stepladder:  Make sure that it is fully opened. Do not climb a closed stepladder.  Make sure that both sides of the stepladder are locked into place.  Ask someone to hold it for you, if possible.  Clearly mark and make sure that you can see:  Any grab bars or handrails.  First and last steps.  Where the edge of each step is.  Use tools that help you move around (mobility aids) if they are needed. These include:  Canes.  Walkers.  Scooters.  Crutches.  Turn on the lights when you go into a dark area. Replace any light bulbs as soon as they burn out.  Set up your furniture so you have a clear path. Avoid moving your furniture around.  If any of your floors are uneven, fix them.  If there are any pets around you, be aware of where they are.  Review your medicines with your doctor. Some medicines can make you feel dizzy. This can increase your chance of falling. Ask your doctor what other things that you can do to help prevent falls. This information is not intended to replace advice given to you by your health care provider. Make sure you discuss any questions you have with your health care  provider. Document Released: 10/02/2009 Document Revised: 05/13/2016 Document Reviewed: 01/10/2015 Elsevier Interactive Patient Education  2017 [employer].

## 2021-01-28 NOTE — Progress Notes (Signed)
Subjective:   Larry [name] is a 87 y.o. male who presents for Medicare Annual/Subsequent preventive examination.  Review of Systems    No ROS. Medicare Wellness Visit. Additional risk factors are reflected in social history. Cardiac Risk Factors include: advanced age (>54men, >53 women);dyslipidemia;family history of premature cardiovascular disease;male gender     Objective:    Today's Vitals   01/28/21 1540  BP: 118/80  Pulse: 88  Temp: 98.3 F (36.8 C)  SpO2: 97%  Weight: 178 lb (80.7 kg)  Height: 5\' 7"  (1.702 m)  PainSc: 0-No pain   Body mass index is 27.88 kg/m.  Advanced Directives 01/28/2021 08/11/2020 09/05/2018 01/25/2018 12/19/2017 11/22/2017 08/31/2017  Does Patient Have a Medical Advance Directive? Yes Yes Yes - Yes No Yes  Type of Advance Directive - [place];Living will [place];Living will - Living will - [place];Living will  Does patient want to make changes to medical advance directive? No - Patient declined - - - No - Patient declined - -  Copy of [place] in Chart? - - No - copy requested - - - No - copy requested  Would patient like information on creating a medical advance directive? - - - No - Patient declined - No - Patient declined -    Current Medications (verified) Outpatient Encounter Medications as of 01/28/2021  Medication Sig  . acetaminophen (TYLENOL) 650 MG CR tablet Take 650 mg by mouth every 8 (eight) hours as needed for pain.   . calcium-vitamin D (OSCAL WITH D) 500-200 MG-UNIT per tablet Take 1 tablet by mouth 2 (two) times daily.   . cetirizine (ZYRTEC) 10 MG tablet Take 10 mg by mouth daily.  . clopidogrel (PLAVIX) 75 MG tablet TAKE 1 TABLET BY MOUTH EVERY DAY  . fish oil-omega-3 fatty acids 1000 MG capsule Take 1 g by mouth 2 (two) times daily.   . folic acid (FOLVITE) 867 MCG tablet Take 400 mcg by mouth daily.    . Multiple Vitamin (MULTIVITAMIN WITH  MINERALS) TABS tablet Take 1 tablet by mouth daily. Centrum Silver  . rosuvastatin (CRESTOR) 10 MG tablet Take 1 tablet (10 mg total) by mouth in the morning and at bedtime. Take 1 tablet daily alternating with half tablet daily.  . tamsulosin (FLOMAX) 0.4 MG CAPS capsule Take 1 capsule (0.4 mg total) by mouth daily.   No facility-administered encounter medications on file as of 01/28/2021.    Allergies (verified) Beta adrenergic blockers and Simvastatin   History: Past Medical History:  Diagnosis Date  . Allergic rhinitis, cause unspecified 03/12/2013  . Allergy   . CAD (coronary artery disease)    S/P CABG in 1999   . Cataract   . Decreased exercise tolerance    Exertional fatigue  . Degenerative arthritis of hip   . Diverticulosis of colon   . ED (erectile dysfunction)   . Erectile dysfunction 06/03/2014  . History of colonic polyps   . History of kidney stones   . History of MI (myocardial infarction)   . Hx of adenomatous colonic polyps 2000  . Hyperlipidemia    Low HDL  . Lumbar disc disease    Hx of with recent back pain and buttock pain  . Myocardial infarction ([place]) 1999  . Right knee meniscal tear   . S/P removal of thyroid nodule   . Spinal stenosis of lumbar region 03/15/2014   Past Surgical History:  Procedure Laterality Date  .  CARDIAC CATHETERIZATION    . CATARACT EXTRACTION    . COLONOSCOPY    . CORONARY ARTERY BYPASS GRAFT  1999  . CYSTOSCOPY/URETEROSCOPY/HOLMIUM LASER/STENT PLACEMENT Left 12/23/2017   Procedure: CYSTOSCOPY/RETROGRADE/URETEROSCOPY/HOLMIUM LASER/STENT PLACEMENT;  Surgeon: Festus Aloe, MD;  Location: [facility];  Service: Urology;  Laterality: Left;  ONLY NEEDS 60 MIN  . LUMBAR DISC SURGERY    . Nodules on Thyroid    . POLYPECTOMY    . PTCA    . SHOULDER ARTHROSCOPY WITH ROTATOR CUFF REPAIR AND SUBACROMIAL DECOMPRESSION Right 01/30/2015   Procedure: RIGHT ARTHROSCOPY SHOULDER SUBACROMIAL DECOMPRESSION DISTAL CLAVICAL RESECTION RETATOR CUFF  REPAIR;  Surgeon: Justice Britain, MD;  Location: [place] [facility];  Service: Orthopedics;  Laterality: Right;  . TONSILLECTOMY    . VASECTOMY     Family History  Problem Relation Age of Onset  . Prostate cancer Other   . Prostate cancer Other   . Heart disease Mother   . Heart disease Brother   . Colon cancer Neg Hx   . Rectal cancer Neg Hx   . Stomach cancer Neg Hx    Social History   Socioeconomic History  . Marital status: Married    Spouse name: Not on file  . Number of children: 2  . Years of education: Not on file  . Highest education level: Not on file  Occupational History  . 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 Smoking status: Never Smoker  . Smokeless tobacco: Never Used  Vaping Use  . Vaping Use: Never used  Substance and Sexual Activity  . Alcohol use: Yes    Alcohol/week: 0.0 standard drinks    Comment: rare  . Drug use: No  . Sexual activity: Not Currently  Other Topics Concern  . Not on file  Social History Narrative   Moved from [facility]   Married      [employer] on file   Social Determinants of Health   Financial Resource Strain: Low Risk   . Difficulty of Paying Living Expenses: Not hard at all  Food Insecurity: No Food Insecurity  . Worried About Charity fundraiser in the Last Year: Never true  . Ran Out of Food in the Last Year: Never true  Transportation Needs: No Transportation Needs  . Lack of Transportation (Medical): No  . Lack of Transportation (Non-Medical): No  Physical Activity: Sufficiently Active  . Days of Exercise per Week: 5 days  . Minutes of Exercise per Session: 30 min  Stress: No Stress Concern Present  . Feeling of Stress : Not at all  Social Connections: Socially Integrated  . Frequency of Communication with Friends and Family: More than three times a week  . Frequency of Social Gatherings with Friends and Family: Once a week  . Attends Religious Services: 1 to 4 times per  year  . Active Member of Clubs or Organizations: No  . Attends Archivist Meetings: 1 to 4 times per year  . Marital Status: Married    Tobacco Counseling Counseling given: Not Answered   Clinical Intake:  Pre-visit preparation completed: Yes  Pain : No/denies pain Pain Score: 0-No pain     BMI - recorded: 27.88 Nutritional Status: BMI 25 -29 Overweight Nutritional Risks: None Diabetes: No  How often do you need to have someone help you when you read instructions, pamphlets, or other written materials from your doctor or pharmacy?: 1 - Never What is the last  grade level you completed in school?: High School Graduate  Diabetic? no  Interpreter Needed?: No  Information entered by :: Lisette Abu, LPN   Activities of Daily Living In your present state of health, do you have any difficulty performing the following activities: 01/28/2021 09/30/2020  Hearing? N N  Vision? N N  Difficulty concentrating or making decisions? N N  Walking or climbing stairs? N N  Dressing or bathing? N N  Doing errands, shopping? N N  Preparing Food and eating ? N -  Using the Toilet? N -  In the past six months, have you accidently leaked urine? N -  Do you have problems with loss of bowel control? N -  Managing your Medications? N -  Managing your Finances? N -  Housekeeping or managing your Housekeeping? N -  Some recent data might be hidden    Patient Care Team: Biagio Borg, MD as PCP - General Angelena Form Annita Brod, MD as PCP - Cardiology (Cardiology)  Indicate any recent Medical Services you may have received from other than [facility] providers in the past year (date may be approximate).     Assessment:   This is a routine wellness examination for [place].  Hearing/Vision screen No exam data present  Dietary issues and exercise activities discussed: Current Exercise Habits: Home exercise routine, Type of exercise: walking;strength training/weights;Other - see  comments (rides [place] bicycle; in therapy for balance 2 days/week), Time (Minutes): 30, Frequency (Times/Week): 5, Weekly Exercise (Minutes/Week): 150, Intensity: Moderate  Goals    .  Client understands the importance of follow-up with providers by attending scheduled visits (pt-stated)      Continue to stay as healthy as I can.    .  I want to be able to walk further and have more endurance      Do more physical therapy for balance, get a walker to use, and be as active as possible.    Marland Kitchen  LDL CALC < 70    .  patient (pt-stated)      Goal is to stay healthy and continue to progress as tolerated     .  Patient Stated      Continue to exercise, eat healthy, enjoy life and family.      Depression Screen PHQ 2/9 Scores 01/28/2021 09/30/2020 09/30/2020 09/07/2019 09/05/2018 09/01/2018 08/31/2017  PHQ - 2 Score 0 0 0 0 1 1 0  PHQ- 9 Score - - - - - - 2    Fall Risk Fall Risk  01/28/2021 09/30/2020 09/30/2020 09/07/2019 09/05/2018  Falls in the past year? 1 0 0 0 No  Number falls in past yr: 0 - 0 - -  Injury with Fall? 0 - 0 - -  Risk for fall due to : Impaired balance/gait - No Fall Risks - Impaired balance/gait;Impaired mobility  Follow up Falls evaluation completed;Education provided - Falls evaluation completed - -    FALL RISK PREVENTION PERTAINING TO THE HOME:  Any stairs in or around the home? Yes  If so, are there any without handrails? No  Home free of loose throw rugs in walkways, pet beds, electrical cords, etc? Yes  Adequate lighting in your home to reduce risk of falls? Yes   ASSISTIVE DEVICES UTILIZED TO PREVENT FALLS:  Life alert? No  Use of a cane, walker or w/c? Yes  Grab bars in the bathroom? Yes  Shower chair or bench in shower? Yes  Elevated toilet seat or a handicapped toilet? Yes  TIMED UP AND GO:  Was the test performed? No .  Length of time to ambulate 10 feet: 0 sec.   Gait steady and fast with assistive device  Cognitive Function: MMSE - Mini Mental  State Exam 09/05/2018 04/20/2016 04/20/2016  Not completed: - (No Data) (No Data)  Orientation to time 5 - -  Orientation to Place 5 - -  Registration 3 - -  Attention/ Calculation 4 - -  Recall 2 - -  Language- name 2 objects 2 - -  Language- repeat 1 - -  Language- follow 3 step command 3 - -  Language- read & follow direction 1 - -  Write a sentence 1 - -  Copy design 1 - -  Total score 28 - -        Immunizations Immunization History  Administered Date(s) Administered  . Fluad Quad(high Dose 65+) 09/07/2019, 09/30/2020  . Influenza Split 10/01/2011, 09/06/2012  . Influenza Whole 09/20/2007, 10/04/2008, 10/13/2009, 09/07/2010  . Influenza, High Dose Seasonal PF 08/12/2017, 09/01/2018  . Influenza,inj,Quad PF,6+ Mos 09/26/2013, 09/19/2014  . Influenza-Unspecified 12/02/1999, 10/15/2015  . PFIZER(Purple Top)SARS-COV-2 Vaccination 01/25/2020, 02/15/2020, 09/27/2020  . Pneumococcal Conjugate-13 03/15/2014  . Pneumococcal Polysaccharide-23 01/20/2007  . Td 01/20/2002  . Tetanus 03/12/2013  . Zoster Recombinat (Shingrix) 01/30/2019, 07/03/2019    TDAP status: Up to date  Flu Vaccine status: Up to date  Pneumococcal vaccine status: Up to date  Covid-19 vaccine status: Completed vaccines  Qualifies for Shingles Vaccine? Yes   Zostavax completed Yes   Shingrix Completed?: Yes  Screening Tests Health Maintenance  Topic Date Due  . TETANUS/TDAP  03/13/2023  . INFLUENZA VACCINE  Completed  . COVID-19 Vaccine  Completed  . PNA vac Low Risk Adult  Completed    Health Maintenance  There are no preventive care reminders to display for this patient.  Colorectal cancer screening: No longer required.   Lung Cancer Screening: (Low Dose CT Chest recommended if Age 64-80 years, 30 pack-year currently smoking OR have quit w/in 15years.) does not qualify.   Lung Cancer Screening Referral: no  Additional Screening:  Hepatitis C Screening: does not qualify; Completed:  no  Vision Screening: Recommended annual ophthalmology exams for early detection of glaucoma and other disorders of the eye. Is the patient up to date with their annual eye exam?  Yes  Who is the provider or what is the name of the office in which the patient attends annual eye exams? Clent Jacks, MD. If pt is not established with a provider, would they like to be referred to a provider to establish care? No .   Dental Screening: Recommended annual dental exams for proper oral hygiene  Community Resource Referral / Chronic Care Management: CRR required this visit?  No   CCM required this visit?  No      Plan:     I have personally reviewed and noted the following in the patient's chart:   . Medical and social history . Use of alcohol, tobacco or illicit drugs  . Current medications and supplements . Functional ability and status . Nutritional status . Physical activity . Advanced directives . List of other physicians . Hospitalizations, surgeries, and ER visits in previous 12 months . Vitals . Screenings to include cognitive, depression, and falls . Referrals and appointments  In addition, I have reviewed and discussed with patient certain preventive protocols, quality metrics, and best practice recommendations. A written personalized care plan for preventive services as well as general preventive  health recommendations were provided to patient.     Sheral Flow, LPN   0/0/2984   Nurse Notes: n/a

## 2021-02-04 ENCOUNTER — Other Ambulatory Visit: Payer: Self-pay

## 2021-02-04 ENCOUNTER — Ambulatory Visit: Payer: Medicare Other

## 2021-02-04 DIAGNOSIS — M6281 Muscle weakness (generalized): Secondary | ICD-10-CM | POA: Diagnosis not present

## 2021-02-04 DIAGNOSIS — R2681 Unsteadiness on feet: Secondary | ICD-10-CM

## 2021-02-04 DIAGNOSIS — R296 Repeated falls: Secondary | ICD-10-CM

## 2021-02-04 DIAGNOSIS — R2689 Other abnormalities of gait and mobility: Secondary | ICD-10-CM

## 2021-02-04 NOTE — Therapy (Signed)
[facility] [address], [place], [place], [address] Phone: [phone]   Fax:  [phone]  Physical Therapy Treatment  Patient Details  Name: Larry [name] MRN: [id number] Date of Birth: January 20, 1932 Referring Provider (PT): Earleen Newport, MD   Encounter Date: 02/04/2021   PT End of Session - 02/04/21 1015    Visit Number 35    Date for PT Re-Evaluation 03/23/21    Authorization Type Medicare and [employer]- visit 8 for 2022  ($2110 is threshold for KX- keep an eye on this)    Progress Note Due on Visit 40    PT Start Time 0932    PT Stop Time 1014    PT Time Calculation (min) 42 min    Equipment Utilized During Treatment Gait belt    Activity Tolerance Patient tolerated treatment well    Behavior During Therapy [facility] for tasks assessed/performed           Past Medical History:  Diagnosis Date  . Allergic rhinitis, cause unspecified 03/12/2013  . Allergy   . CAD (coronary artery disease)    S/P CABG in 1999   . Cataract   . Decreased exercise tolerance    Exertional fatigue  . Degenerative arthritis of hip   . Diverticulosis of colon   . ED (erectile dysfunction)   . Erectile dysfunction 06/03/2014  . History of colonic polyps   . History of kidney stones   . History of MI (myocardial infarction)   . Hx of adenomatous colonic polyps 2000  . Hyperlipidemia    Low HDL  . Lumbar disc disease    Hx of with recent back pain and buttock pain  . Myocardial infarction ([place]) 1999  . Right knee meniscal tear   . S/P removal of thyroid nodule   . Spinal stenosis of lumbar region 03/15/2014    Past Surgical History:  Procedure Laterality Date  . CARDIAC CATHETERIZATION    . CATARACT EXTRACTION    . COLONOSCOPY    . CORONARY ARTERY BYPASS GRAFT  1999  . CYSTOSCOPY/URETEROSCOPY/HOLMIUM LASER/STENT PLACEMENT Left 12/23/2017   Procedure: CYSTOSCOPY/RETROGRADE/URETEROSCOPY/HOLMIUM LASER/STENT PLACEMENT;   Surgeon: Festus Aloe, MD;  Location: [facility];  Service: Urology;  Laterality: Left;  ONLY NEEDS 60 MIN  . LUMBAR DISC SURGERY    . Nodules on Thyroid    . POLYPECTOMY    . PTCA    . SHOULDER ARTHROSCOPY WITH ROTATOR CUFF REPAIR AND SUBACROMIAL DECOMPRESSION Right 01/30/2015   Procedure: RIGHT ARTHROSCOPY SHOULDER SUBACROMIAL DECOMPRESSION DISTAL CLAVICAL RESECTION RETATOR CUFF REPAIR;  Surgeon: Justice Britain, MD;  Location: [place];  Service: Orthopedics;  Laterality: Right;  . TONSILLECTOMY    . VASECTOMY      There were no vitals filed for this visit.   Subjective Assessment - 02/04/21 0940    Patient Stated Goals Would like to improve balance to be able to walk longer distances, strengthen R calf, be able to get out of a chair, turn and bend forward without fear of falling.    Currently in Pain? No/denies                             [facility] Adult PT Treatment/Exercise - 02/04/21 0001      Neuro Re-ed    Neuro Re-ed Details  standing with ball toss on walls with gait belt 2x10      Knee/Hip Exercises: Aerobic   Nustep L4  10 min while discussing status/progress       Knee/Hip Exercises: Machines for Strengthening   Total Gym Leg Press Seat 7: BLE 110#  2x10 reps, 60# single leg 2x10      Knee/Hip Exercises: Standing   Other Standing Knee Exercises standing on black pad alternating step taps on 6" step standing on black pad      Knee/Hip Exercises: Seated   Sit to Sand 20 reps;without UE support   10# kettlebell              Balance Exercises - 02/04/21 0001      Balance Exercises: Standing   Tandem Gait --    Sidestepping --    Sidestepping Limitations --    Other Standing Exercises using colored discs on the floor: call out color and have pt step over low cone and alternating tap on disc on floor. Step over cone when returning to start position  Gait belt used for safety.    Other Standing Exercises Comments stand from mat table and tap colored  disc on opposite mat table.  Placed at various positions and pt was encouraged to touch with both hands. Gait belt used for safety               PT Short Term Goals - 10/27/20 1301      PT SHORT TERM GOAL #2   Title Pt will improve RLE gastroc/soleus strength to be able to perform 2-3 single leg heel lifts to improve push off during gait    Baseline 0 single leg heel raise on Rt, Lt x 5    Time 4    Period Weeks    Status On-going             PT Long Term Goals - 01/26/21 1105      PT LONG TERM GOAL #1   Title Pt will demonstrate independence with final HEP    Time 8    Period Weeks    Status On-going    Target Date 03/23/21      PT LONG TERM GOAL #2   Title improve ABC to > or = to 90% to improve community ambulation and safety    Baseline 84%    Time 8    Period Weeks    Status On-going    Target Date 03/23/21      PT LONG TERM GOAL #3   Title Pt will improve BERG balance score to >/= 51/56 to indicate decreased risk for falls    Baseline 47/56-significant to high falls risk    Time 8    Period Weeks    Status On-going    Target Date 03/23/21      PT LONG TERM GOAL #4   Title Pt will negotiate 12 stairs with one rail, alternating sequence without catching either foot on stairs, MOD I    Baseline no longer occurring regularly    Status Achieved      PT LONG TERM GOAL #5   Title Pt will demonstrate ability to stand from low chair (14") without use of UE x 5 reps in </= 12 seconds    Baseline 7.66 seconds    Status Achieved      PT LONG TERM GOAL #6   Title improve strength, endurance and stability to safely walk 20-25  minutes at the park without need to sit and rest to improve community independence    Baseline 15    Time 8  Period Weeks    Status Revised    Target Date 03/23/21                 Plan - 02/04/21 0956    Clinical Impression Statement The patient is performing higher level balance challenges (standing on soft surfaces, change  of directions and reaching with feet and hands).  Pt is able to adjust independently and doesn't use as many compensatory motions with trunk.  CGA and occasional min assist from therapist with higher level tasksStep length remains shortened with flexed trunk.   Pt is making good progress yet slow related to chronicity of condition.  With use of props, pt is able to improve step height and length  Pt will continue 1x/wk and pt will continue to benefit from skilled PT to improve safety at home and in the community.    Rehab Potential Good    PT Frequency 1x / week    PT Duration 8 weeks    PT Treatment/Interventions ADLs/Self Care Home Management;Aquatic Therapy;Electrical Stimulation;DME Instruction;Gait training;Stair training;Functional mobility training;Therapeutic activities;Therapeutic exercise;Balance training;Neuromuscular re-education;Patient/family education    PT Next Visit Plan balance turning tandem and staggered stand, stepping over and on obstacles, reaching, KX later this year    PT [place]           Patient will benefit from skilled therapeutic intervention in order to improve the following deficits and impairments:  Abnormal gait,Decreased balance,Decreased strength,Difficulty walking  Visit Diagnosis: Muscle weakness (generalized)  Repeated falls  Other abnormalities of gait and mobility  Unsteadiness on feet     Problem List Patient Active Problem List   Diagnosis Date Noted  . Dysfunction of Eustachian tube, bilateral 07/21/2020  . Gait disorder 04/20/2016  . Hyperglycemia 04/20/2016  . Abnormal CT scan, chest 06/03/2014  . Erectile dysfunction 06/03/2014  . Spinal stenosis of lumbar region 03/15/2014  . Cough 03/12/2013  . Allergic rhinitis 03/12/2013  . Leg pain, bilateral 12/30/2011  . Bladder neck obstruction 12/30/2011  . Preventative health care 12/25/2011  . Lumbar disc disease 12/25/2011  . S/P removal of thyroid nodule  12/25/2011  . History of MI (myocardial infarction) 12/25/2011  . Right knee meniscal tear 12/25/2011  . Degenerative arthritis of hip 12/25/2011  . HIP PAIN, RIGHT 05/14/2010  . FATIGUE 10/13/2009  . HYPERTENSION, BENIGN 02/25/2009  . CAD, AUTOLOGOUS BYPASS GRAFT 02/25/2009  . CORONARY ARTERY ANEURYSM 02/25/2009  . BACK PAIN 04/15/2008  . Hyperlipidemia 02/13/2008  . CORONARY ARTERY DISEASE 02/13/2008  . DIVERTICULOSIS, COLON 02/13/2008  . CARDIAC DISEASE, HX OF 02/13/2008  . COLONIC POLYPS, HX OF 02/13/2008     Sigurd Sos, PT 02/04/21 10:16 AM  [facility] [address] [place], [place], [address] Phone: [phone]   Fax:  [phone]  Name: Dvid Pendry MRN: [id number] Date of Birth: 02/07/1932

## 2021-02-05 ENCOUNTER — Other Ambulatory Visit: Payer: Self-pay | Admitting: Internal Medicine

## 2021-02-05 NOTE — Telephone Encounter (Signed)
Please refill as per office routine med refill policy (all routine meds refilled for 3 mo or monthly per pt preference up to one year from last visit, then month to month grace period for 3 mo, then further med refills will have to be denied)  

## 2021-02-17 ENCOUNTER — Other Ambulatory Visit: Payer: Self-pay

## 2021-02-17 ENCOUNTER — Ambulatory Visit: Payer: Medicare Other | Attending: Neurological Surgery | Admitting: Physical Therapy

## 2021-02-17 DIAGNOSIS — R2689 Other abnormalities of gait and mobility: Secondary | ICD-10-CM | POA: Diagnosis not present

## 2021-02-17 DIAGNOSIS — R296 Repeated falls: Secondary | ICD-10-CM | POA: Insufficient documentation

## 2021-02-17 DIAGNOSIS — R2681 Unsteadiness on feet: Secondary | ICD-10-CM | POA: Diagnosis not present

## 2021-02-17 DIAGNOSIS — M6281 Muscle weakness (generalized): Secondary | ICD-10-CM | POA: Diagnosis not present

## 2021-02-17 NOTE — Therapy (Signed)
[facility] [address] [place], [place], [address] Phone: [phone]   Fax:  [phone]  Physical Therapy Treatment  Patient Details  Name: Larry [name] MRN: [id number] Date of Birth: 10-16-1932 Referring Provider (PT): Earleen Newport, MD   Encounter Date: 02/17/2021   PT End of Session - 02/17/21 1056    Visit Number 36    Date for PT Re-Evaluation 03/23/21    Authorization Type Medicare and [employer]- visit 8 for 2022  ($2110 is threshold for KX- keep an eye on this)    Progress Note Due on Visit 40    PT Start Time 1016    PT Stop Time 1100    PT Time Calculation (min) 44 min    Activity Tolerance Patient tolerated treatment well           Past Medical History:  Diagnosis Date  . Allergic rhinitis, cause unspecified 03/12/2013  . Allergy   . CAD (coronary artery disease)    S/P CABG in 1999   . Cataract   . Decreased exercise tolerance    Exertional fatigue  . Degenerative arthritis of hip   . Diverticulosis of colon   . ED (erectile dysfunction)   . Erectile dysfunction 06/03/2014  . History of colonic polyps   . History of kidney stones   . History of MI (myocardial infarction)   . Hx of adenomatous colonic polyps 2000  . Hyperlipidemia    Low HDL  . Lumbar disc disease    Hx of with recent back pain and buttock pain  . Myocardial infarction ([place]) 1999  . Right knee meniscal tear   . S/P removal of thyroid nodule   . Spinal stenosis of lumbar region 03/15/2014    Past Surgical History:  Procedure Laterality Date  . CARDIAC CATHETERIZATION    . CATARACT EXTRACTION    . COLONOSCOPY    . CORONARY ARTERY BYPASS GRAFT  1999  . CYSTOSCOPY/URETEROSCOPY/HOLMIUM LASER/STENT PLACEMENT Left 12/23/2017   Procedure: CYSTOSCOPY/RETROGRADE/URETEROSCOPY/HOLMIUM LASER/STENT PLACEMENT;  Surgeon: Festus Aloe, MD;  Location: [facility];  Service: Urology;  Laterality: Left;  ONLY NEEDS 60 MIN  .  LUMBAR DISC SURGERY    . Nodules on Thyroid    . POLYPECTOMY    . PTCA    . SHOULDER ARTHROSCOPY WITH ROTATOR CUFF REPAIR AND SUBACROMIAL DECOMPRESSION Right 01/30/2015   Procedure: RIGHT ARTHROSCOPY SHOULDER SUBACROMIAL DECOMPRESSION DISTAL CLAVICAL RESECTION RETATOR CUFF REPAIR;  Surgeon: Justice Britain, MD;  Location: [place];  Service: Orthopedics;  Laterality: Right;  . TONSILLECTOMY    . VASECTOMY      There were no vitals filed for this visit.   Subjective Assessment - 02/17/21 1020    Subjective My balance still needs work.  Closing my eyes is difficult.  It's hard to straighten up.    Pertinent History In 1964 had disc removed with residual right LE weakness    Diagnostic tests Xray demonstrated lumbar scoliosis and spondylolisthesis stable from a year ago.    Patient Stated Goals Would like to improve balance to be able to walk longer distances, strengthen R calf, be able to get out of a chair, turn and bend forward without fear of falling.    Currently in Pain? No/denies    Pain Score 0-No pain                             [facility] Adult PT  Treatment/Exercise - 02/17/21 0001      Therapeutic Activites    ADL's sit to stand; stairs, lifting, carrying       Neuro Re-ed    Neuro Re-ed Details  lateral, forward and backward over cones; cornhole toss with turns; tandem stand with head turns, eyes closed, reaching and cognitive component      Knee/Hip Exercises: Aerobic   Nustep L4 10 min while discussing status/progress       Knee/Hip Exercises: Machines for Strengthening   Total Gym Leg Press Seat 7: BLE 110#  2x10 reps, 60# single leg 2x10      Knee/Hip Exercises: Standing   Other Standing Knee Exercises 15# dead lifts 15x    Other Standing Knee Exercises 15# kettlebell swings10x                    PT Short Term Goals - 10/27/20 1301      PT SHORT TERM GOAL #2   Title Pt will improve RLE gastroc/soleus strength to be able to perform 2-3 single  leg heel lifts to improve push off during gait    Baseline 0 single leg heel raise on Rt, Lt x 5    Time 4    Period Weeks    Status On-going             PT Long Term Goals - 01/26/21 1105      PT LONG TERM GOAL #1   Title Pt will demonstrate independence with final HEP    Time 8    Period Weeks    Status On-going    Target Date 03/23/21      PT LONG TERM GOAL #2   Title improve ABC to > or = to 90% to improve community ambulation and safety    Baseline 84%    Time 8    Period Weeks    Status On-going    Target Date 03/23/21      PT LONG TERM GOAL #3   Title Pt will improve BERG balance score to >/= 51/56 to indicate decreased risk for falls    Baseline 47/56-significant to high falls risk    Time 8    Period Weeks    Status On-going    Target Date 03/23/21      PT LONG TERM GOAL #4   Title Pt will negotiate 12 stairs with one rail, alternating sequence without catching either foot on stairs, MOD I    Baseline no longer occurring regularly    Status Achieved      PT LONG TERM GOAL #5   Title Pt will demonstrate ability to stand from low chair (14") without use of UE x 5 reps in </= 12 seconds    Baseline 7.66 seconds    Status Achieved      PT LONG TERM GOAL #6   Title improve strength, endurance and stability to safely walk 20-25  minutes at the park without need to sit and rest to improve community independence    Baseline 15    Time 8    Period Weeks    Status Revised    Target Date 03/23/21                 Plan - 02/17/21 1058    Clinical Impression Statement The patient demonstrates reduced compensatory trunk forward lean and flexed knees with more challenging balance ex's.  He needs only short duration rest breaks for shortness of breath which could  also be related to mask-wearing.  He needs some self recovery of finger tip support on counter.   CGA for safety provided for all standing interventions but no physical assist needed.  On track to meet  remainder of goals in the next 4 visits.    Comorbidities allergic rhinitis, CAD s/p CABG in 1999, OA of hip, h/o MI, adenomatous colonic polyps, HLD, lumbar disc disease, R knee meniscus tear, removal of thyroid nodule, lumbar spinal stenosis, shoulder arthroscopy wtih rotator cuff repair and subacromial decompression    Rehab Potential Good    PT Frequency 1x / week    PT Duration 8 weeks    PT Treatment/Interventions ADLs/Self Care Home Management;Aquatic Therapy;Electrical Stimulation;DME Instruction;Gait training;Stair training;Functional mobility training;Therapeutic activities;Therapeutic exercise;Balance training;Neuromuscular re-education;Patient/family education    PT Next Visit Plan balance turning tandem and staggered stand, stepping over and on obstacles, reaching, KX later this year    PT [place]           Patient will benefit from skilled therapeutic intervention in order to improve the following deficits and impairments:  Abnormal gait,Decreased balance,Decreased strength,Difficulty walking  Visit Diagnosis: Muscle weakness (generalized)  Repeated falls  Other abnormalities of gait and mobility  Unsteadiness on feet     Problem List Patient Active Problem List   Diagnosis Date Noted  . Dysfunction of Eustachian tube, bilateral 07/21/2020  . Gait disorder 04/20/2016  . Hyperglycemia 04/20/2016  . Abnormal CT scan, chest 06/03/2014  . Erectile dysfunction 06/03/2014  . Spinal stenosis of lumbar region 03/15/2014  . Cough 03/12/2013  . Allergic rhinitis 03/12/2013  . Leg pain, bilateral 12/30/2011  . Bladder neck obstruction 12/30/2011  . Preventative health care 12/25/2011  . Lumbar disc disease 12/25/2011  . S/P removal of thyroid nodule 12/25/2011  . History of MI (myocardial infarction) 12/25/2011  . Right knee meniscal tear 12/25/2011  . Degenerative arthritis of hip 12/25/2011  . HIP PAIN, RIGHT 05/14/2010  . FATIGUE 10/13/2009  .  HYPERTENSION, BENIGN 02/25/2009  . CAD, AUTOLOGOUS BYPASS GRAFT 02/25/2009  . CORONARY ARTERY ANEURYSM 02/25/2009  . BACK PAIN 04/15/2008  . Hyperlipidemia 02/13/2008  . CORONARY ARTERY DISEASE 02/13/2008  . DIVERTICULOSIS, COLON 02/13/2008  . CARDIAC DISEASE, HX OF 02/13/2008  . COLONIC POLYPS, HX OF 02/13/2008   Ruben Im, PT 02/17/21 5:45 PM Phone: [phone] Fax: [phone] Alvera Singh 02/17/2021, 5:44 PM  [facility] [address], [place], [place], [address] Phone: [phone]   Fax:  [phone]  Name: Larry [name] MRN: [id number] Date of Birth: 11-Jul-1932

## 2021-02-18 DIAGNOSIS — Z85828 Personal history of other malignant neoplasm of skin: Secondary | ICD-10-CM | POA: Diagnosis not present

## 2021-02-18 DIAGNOSIS — L570 Actinic keratosis: Secondary | ICD-10-CM | POA: Diagnosis not present

## 2021-02-24 ENCOUNTER — Ambulatory Visit: Payer: Medicare Other | Admitting: Physical Therapy

## 2021-02-24 ENCOUNTER — Other Ambulatory Visit: Payer: Self-pay

## 2021-02-24 DIAGNOSIS — M6281 Muscle weakness (generalized): Secondary | ICD-10-CM

## 2021-02-24 DIAGNOSIS — R2689 Other abnormalities of gait and mobility: Secondary | ICD-10-CM

## 2021-02-24 DIAGNOSIS — R296 Repeated falls: Secondary | ICD-10-CM

## 2021-02-24 DIAGNOSIS — R2681 Unsteadiness on feet: Secondary | ICD-10-CM

## 2021-02-24 NOTE — Therapy (Signed)
[facility] [address], [place], [place], [address] Phone: [phone]   Fax:  [phone]  Physical Therapy Treatment  Patient Details  Name: Larry [name] MRN: [id number] Date of Birth: Dec 08, 1932 Referring Provider (PT): Earleen Newport, MD   Encounter Date: 02/24/2021   PT End of Session - 02/24/21 1610    Visit Number 37    Date for PT Re-Evaluation 03/23/21    Authorization Type Medicare and [employer]- visit 8 for 2022  ($2110 is threshold for KX- keep an eye on this)    Progress Note Due on Visit 40    PT Start Time 1530    PT Stop Time 1612    PT Time Calculation (min) 42 min    Activity Tolerance Patient tolerated treatment well           Past Medical History:  Diagnosis Date  . Allergic rhinitis, cause unspecified 03/12/2013  . Allergy   . CAD (coronary artery disease)    S/P CABG in 1999   . Cataract   . Decreased exercise tolerance    Exertional fatigue  . Degenerative arthritis of hip   . Diverticulosis of colon   . ED (erectile dysfunction)   . Erectile dysfunction 06/03/2014  . History of colonic polyps   . History of kidney stones   . History of MI (myocardial infarction)   . Hx of adenomatous colonic polyps 2000  . Hyperlipidemia    Low HDL  . Lumbar disc disease    Hx of with recent back pain and buttock pain  . Myocardial infarction ([place]) 1999  . Right knee meniscal tear   . S/P removal of thyroid nodule   . Spinal stenosis of lumbar region 03/15/2014    Past Surgical History:  Procedure Laterality Date  . CARDIAC CATHETERIZATION    . CATARACT EXTRACTION    . COLONOSCOPY    . CORONARY ARTERY BYPASS GRAFT  1999  . CYSTOSCOPY/URETEROSCOPY/HOLMIUM LASER/STENT PLACEMENT Left 12/23/2017   Procedure: CYSTOSCOPY/RETROGRADE/URETEROSCOPY/HOLMIUM LASER/STENT PLACEMENT;  Surgeon: Festus Aloe, MD;  Location: [facility];  Service: Urology;  Laterality: Left;  ONLY NEEDS 60 MIN  .  LUMBAR DISC SURGERY    . Nodules on Thyroid    . POLYPECTOMY    . PTCA    . SHOULDER ARTHROSCOPY WITH ROTATOR CUFF REPAIR AND SUBACROMIAL DECOMPRESSION Right 01/30/2015   Procedure: RIGHT ARTHROSCOPY SHOULDER SUBACROMIAL DECOMPRESSION DISTAL CLAVICAL RESECTION RETATOR CUFF REPAIR;  Surgeon: Justice Britain, MD;  Location: [place];  Service: Orthopedics;  Laterality: Right;  . TONSILLECTOMY    . VASECTOMY      There were no vitals filed for this visit.   Subjective Assessment - 02/24/21 1537    Subjective I've been walking 3 days in a row at the park    Pertinent History In 1964 had disc removed with residual right LE weakness    Diagnostic tests Xray demonstrated lumbar scoliosis and spondylolisthesis stable from a year ago.    Patient Stated Goals Would like to improve balance to be able to walk longer distances, strengthen R calf, be able to get out of a chair, turn and bend forward without fear of falling.    Currently in Pain? No/denies    Pain Score 0-No pain                             [facility] Adult PT Treatment/Exercise - 02/24/21 0001  Therapeutic Activites    ADL's sit to stand; stairs, lifting, carrying 2 5# through obstacle course      Neuro Re-ed    Neuro Re-ed Details  lateral, forward and backward over cones; cornhole toss with turns; tandem stand with head turns, eyes closed, reaching and cognitive component; foot prop on BOSU with head turns and UE movements      Knee/Hip Exercises: Aerobic   Nustep L4 10 min while discussing status/progress       Knee/Hip Exercises: Machines for Strengthening   Total Gym Leg Press Seat 7: BLE 110#  2x10 reps, 60# single leg 2x10      Knee/Hip Exercises: Standing   Other Standing Knee Exercises 5# snatch and press overhead    Other Standing Knee Exercises --                    PT Short Term Goals - 10/27/20 1301      PT SHORT TERM GOAL #2   Title Pt will improve RLE gastroc/soleus strength to be  able to perform 2-3 single leg heel lifts to improve push off during gait    Baseline 0 single leg heel raise on Rt, Lt x 5    Time 4    Period Weeks    Status On-going             PT Long Term Goals - 01/26/21 1105      PT LONG TERM GOAL #1   Title Pt will demonstrate independence with final HEP    Time 8    Period Weeks    Status On-going    Target Date 03/23/21      PT LONG TERM GOAL #2   Title improve ABC to > or = to 90% to improve community ambulation and safety    Baseline 84%    Time 8    Period Weeks    Status On-going    Target Date 03/23/21      PT LONG TERM GOAL #3   Title Pt will improve BERG balance score to >/= 51/56 to indicate decreased risk for falls    Baseline 47/56-significant to high falls risk    Time 8    Period Weeks    Status On-going    Target Date 03/23/21      PT LONG TERM GOAL #4   Title Pt will negotiate 12 stairs with one rail, alternating sequence without catching either foot on stairs, MOD I    Baseline no longer occurring regularly    Status Achieved      PT LONG TERM GOAL #5   Title Pt will demonstrate ability to stand from low chair (14") without use of UE x 5 reps in </= 12 seconds    Baseline 7.66 seconds    Status Achieved      PT LONG TERM GOAL #6   Title improve strength, endurance and stability to safely walk 20-25  minutes at the park without need to sit and rest to improve community independence    Baseline 15    Time 8    Period Weeks    Status Revised    Target Date 03/23/21                 Plan - 02/24/21 1611    Clinical Impression Statement The patient is able perform moderate balance challenges with narrow base of support, head turns and obstacle negotiation.  Overall less postural compensations including flexed knees  and hips.  He does need light UE assist particularly with single leg balance on right more so than left.  Therapist able to self stabilize with 2-3 incidences of mild loss of balance.   Therapist providing CGA only for safety.  He should meet remaining goals in the next 2-3 visits.    Comorbidities allergic rhinitis, CAD s/p CABG in 1999, OA of hip, h/o MI, adenomatous colonic polyps, HLD, lumbar disc disease, R knee meniscus tear, removal of thyroid nodule, lumbar spinal stenosis, shoulder arthroscopy wtih rotator cuff repair and subacromial decompression    Rehab Potential Good    PT Frequency 1x / week    PT Duration 8 weeks    PT Treatment/Interventions ADLs/Self Care Home Management;Aquatic Therapy;Electrical Stimulation;DME Instruction;Gait training;Stair training;Functional mobility training;Therapeutic activities;Therapeutic exercise;Balance training;Neuromuscular re-education;Patient/family education    PT Next Visit Plan balance turning tandem and staggered stand, stepping over and on obstacles, reaching, KX later this year    PT [place]           Patient will benefit from skilled therapeutic intervention in order to improve the following deficits and impairments:  Abnormal gait,Decreased balance,Decreased strength,Difficulty walking  Visit Diagnosis: Muscle weakness (generalized)  Repeated falls  Other abnormalities of gait and mobility  Unsteadiness on feet     Problem List Patient Active Problem List   Diagnosis Date Noted  . Dysfunction of Eustachian tube, bilateral 07/21/2020  . Gait disorder 04/20/2016  . Hyperglycemia 04/20/2016  . Abnormal CT scan, chest 06/03/2014  . Erectile dysfunction 06/03/2014  . Spinal stenosis of lumbar region 03/15/2014  . Cough 03/12/2013  . Allergic rhinitis 03/12/2013  . Leg pain, bilateral 12/30/2011  . Bladder neck obstruction 12/30/2011  . Preventative health care 12/25/2011  . Lumbar disc disease 12/25/2011  . S/P removal of thyroid nodule 12/25/2011  . History of MI (myocardial infarction) 12/25/2011  . Right knee meniscal tear 12/25/2011  . Degenerative arthritis of hip 12/25/2011   . HIP PAIN, RIGHT 05/14/2010  . FATIGUE 10/13/2009  . HYPERTENSION, BENIGN 02/25/2009  . CAD, AUTOLOGOUS BYPASS GRAFT 02/25/2009  . CORONARY ARTERY ANEURYSM 02/25/2009  . BACK PAIN 04/15/2008  . Hyperlipidemia 02/13/2008  . CORONARY ARTERY DISEASE 02/13/2008  . DIVERTICULOSIS, COLON 02/13/2008  . CARDIAC DISEASE, HX OF 02/13/2008  . COLONIC POLYPS, HX OF 02/13/2008   Ruben Im, PT 02/24/21 5:06 PM Phone: [phone] Fax: [phone] Alvera Singh 02/24/2021, 5:05 PM  [facility] [address] [place], [place], [address] Phone: [phone]   Fax:  [phone]  Name: Kemon Devincenzi MRN: [id number] Date of Birth: July 10, 1932

## 2021-03-03 ENCOUNTER — Other Ambulatory Visit: Payer: Self-pay

## 2021-03-03 ENCOUNTER — Ambulatory Visit: Payer: Medicare Other | Admitting: Physical Therapy

## 2021-03-03 DIAGNOSIS — R2681 Unsteadiness on feet: Secondary | ICD-10-CM

## 2021-03-03 DIAGNOSIS — M6281 Muscle weakness (generalized): Secondary | ICD-10-CM | POA: Diagnosis not present

## 2021-03-03 DIAGNOSIS — R2689 Other abnormalities of gait and mobility: Secondary | ICD-10-CM

## 2021-03-03 DIAGNOSIS — R296 Repeated falls: Secondary | ICD-10-CM

## 2021-03-03 NOTE — Therapy (Signed)
[facility] [address] [place], [place], [address] Phone: [phone]   Fax:  [phone]  Physical Therapy Treatment  Patient Details  Name: Larry [name] MRN: [id number] Date of Birth: 02-Feb-1932 Referring Provider (PT): Earleen Newport, MD   Encounter Date: 03/03/2021   PT End of Session - 03/03/21 1610    Visit Number 38    Date for PT Re-Evaluation 03/23/21    Authorization Type Medicare and [employer]- visit 8 for 2022  ($2110 is threshold for KX- keep an eye on this)    Progress Note Due on Visit 40    PT Start Time 1530    PT Stop Time 1613    PT Time Calculation (min) 43 min    Activity Tolerance Patient tolerated treatment well           Past Medical History:  Diagnosis Date  . Allergic rhinitis, cause unspecified 03/12/2013  . Allergy   . CAD (coronary artery disease)    S/P CABG in 1999   . Cataract   . Decreased exercise tolerance    Exertional fatigue  . Degenerative arthritis of hip   . Diverticulosis of colon   . ED (erectile dysfunction)   . Erectile dysfunction 06/03/2014  . History of colonic polyps   . History of kidney stones   . History of MI (myocardial infarction)   . Hx of adenomatous colonic polyps 2000  . Hyperlipidemia    Low HDL  . Lumbar disc disease    Hx of with recent back pain and buttock pain  . Myocardial infarction ([place]) 1999  . Right knee meniscal tear   . S/P removal of thyroid nodule   . Spinal stenosis of lumbar region 03/15/2014    Past Surgical History:  Procedure Laterality Date  . CARDIAC CATHETERIZATION    . CATARACT EXTRACTION    . COLONOSCOPY    . CORONARY ARTERY BYPASS GRAFT  1999  . CYSTOSCOPY/URETEROSCOPY/HOLMIUM LASER/STENT PLACEMENT Left 12/23/2017   Procedure: CYSTOSCOPY/RETROGRADE/URETEROSCOPY/HOLMIUM LASER/STENT PLACEMENT;  Surgeon: Festus Aloe, MD;  Location: [facility];  Service: Urology;  Laterality: Left;  ONLY NEEDS 60 MIN  .  LUMBAR DISC SURGERY    . Nodules on Thyroid    . POLYPECTOMY    . PTCA    . SHOULDER ARTHROSCOPY WITH ROTATOR CUFF REPAIR AND SUBACROMIAL DECOMPRESSION Right 01/30/2015   Procedure: RIGHT ARTHROSCOPY SHOULDER SUBACROMIAL DECOMPRESSION DISTAL CLAVICAL RESECTION RETATOR CUFF REPAIR;  Surgeon: Justice Britain, MD;  Location: [employer];  Service: Orthopedics;  Laterality: Right;  . TONSILLECTOMY    . VASECTOMY      There were no vitals filed for this visit.   Subjective Assessment - 03/03/21 1534    Subjective I walked at the park.    Pertinent History In 1964 had disc removed with residual right LE weakness    Patient Stated Goals Would like to improve balance to be able to walk longer distances, strengthen R calf, be able to get out of a chair, turn and bend forward without fear of falling.    Currently in Pain? No/denies    Pain Score 0-No pain                             [facility] Adult PT Treatment/Exercise - 03/03/21 0001      Therapeutic Activites    ADL's sit to stand; stairs, lifting, carrying 2 5# through obstacle course  Neuro Re-ed    Neuro Re-ed Details  lateral, forward and backward over cones; cornhole toss with turns; tandem stand with head turns, eyes closed, reaching standing on foam compliant surface; eyes close closed; staggered stand; stooping with eyes closed; turning with bean bag toss to targets      Knee/Hip Exercises: Aerobic   Nustep L4 10 min while discussing status/progress       Knee/Hip Exercises: Machines for Strengthening   Total Gym Leg Press Seat 7: BLE 110#  2x10 reps, 60# single leg 2x10      Knee/Hip Exercises: Standing   Gait Training side stepping, backwards walk, marching    Other Standing Knee Exercises yellow loop around toes with side to side stepping 10x    Other Standing Knee Exercises step downs from foam and step over cones 10x right/left                    PT Short Term Goals - 10/27/20 1301      PT SHORT  TERM GOAL #2   Title Pt will improve RLE gastroc/soleus strength to be able to perform 2-3 single leg heel lifts to improve push off during gait    Baseline 0 single leg heel raise on Rt, Lt x 5    Time 4    Period Weeks    Status On-going             PT Long Term Goals - 01/26/21 1105      PT LONG TERM GOAL #1   Title Pt will demonstrate independence with final HEP    Time 8    Period Weeks    Status On-going    Target Date 03/23/21      PT LONG TERM GOAL #2   Title improve ABC to > or = to 90% to improve community ambulation and safety    Baseline 84%    Time 8    Period Weeks    Status On-going    Target Date 03/23/21      PT LONG TERM GOAL #3   Title Pt will improve BERG balance score to >/= 51/56 to indicate decreased risk for falls    Baseline 47/56-significant to high falls risk    Time 8    Period Weeks    Status On-going    Target Date 03/23/21      PT LONG TERM GOAL #4   Title Pt will negotiate 12 stairs with one rail, alternating sequence without catching either foot on stairs, MOD I    Baseline no longer occurring regularly    Status Achieved      PT LONG TERM GOAL #5   Title Pt will demonstrate ability to stand from low chair (14") without use of UE x 5 reps in </= 12 seconds    Baseline 7.66 seconds    Status Achieved      PT LONG TERM GOAL #6   Title improve strength, endurance and stability to safely walk 20-25  minutes at the park without need to sit and rest to improve community independence    Baseline 15    Time 8    Period Weeks    Status Revised    Target Date 03/23/21                 Plan - 03/03/21 1611    Clinical Impression Statement The patient is progressing with moderate dynamic balance challenges including standing on a soft surface, eyes  closed and narrow base of support.  He requires CGA throughout these challenges and 1x of min to mod physical assist from therapist when stepping backwards onto the foam.  He does well  today with head turns and turning his body side to side.  Eyes closed continues to be very challenging.  He should meet remaining goals in the next 2 visits and be ready for discharge to an independent HEP.    Comorbidities allergic rhinitis, CAD s/p CABG in 1999, OA of hip, h/o MI, adenomatous colonic polyps, HLD, lumbar disc disease, R knee meniscus tear, removal of thyroid nodule, lumbar spinal stenosis, shoulder arthroscopy wtih rotator cuff repair and subacromial decompression    Rehab Potential Good    PT Frequency 1x / week    PT Duration 8 weeks    PT Treatment/Interventions ADLs/Self Care Home Management;Aquatic Therapy;Electrical Stimulation;DME Instruction;Gait training;Stair training;Functional mobility training;Therapeutic activities;Therapeutic exercise;Balance training;Neuromuscular re-education;Patient/family education    PT Next Visit Plan balance turning tandem and staggered stand, stepping over and on obstacles, reaching, KX later this year    PT [place]           Patient will benefit from skilled therapeutic intervention in order to improve the following deficits and impairments:  Abnormal gait,Decreased balance,Decreased strength,Difficulty walking  Visit Diagnosis: Muscle weakness (generalized)  Repeated falls  Other abnormalities of gait and mobility  Unsteadiness on feet     Problem List Patient Active Problem List   Diagnosis Date Noted  . Dysfunction of Eustachian tube, bilateral 07/21/2020  . Gait disorder 04/20/2016  . Hyperglycemia 04/20/2016  . Abnormal CT scan, chest 06/03/2014  . Erectile dysfunction 06/03/2014  . Spinal stenosis of lumbar region 03/15/2014  . Cough 03/12/2013  . Allergic rhinitis 03/12/2013  . Leg pain, bilateral 12/30/2011  . Bladder neck obstruction 12/30/2011  . Preventative health care 12/25/2011  . Lumbar disc disease 12/25/2011  . S/P removal of thyroid nodule 12/25/2011  . History of MI (myocardial  infarction) 12/25/2011  . Right knee meniscal tear 12/25/2011  . Degenerative arthritis of hip 12/25/2011  . HIP PAIN, RIGHT 05/14/2010  . FATIGUE 10/13/2009  . HYPERTENSION, BENIGN 02/25/2009  . CAD, AUTOLOGOUS BYPASS GRAFT 02/25/2009  . CORONARY ARTERY ANEURYSM 02/25/2009  . BACK PAIN 04/15/2008  . Hyperlipidemia 02/13/2008  . CORONARY ARTERY DISEASE 02/13/2008  . DIVERTICULOSIS, COLON 02/13/2008  . CARDIAC DISEASE, HX OF 02/13/2008  . COLONIC POLYPS, HX OF 02/13/2008   Ruben Im, PT 03/03/21 5:14 PM Phone: [phone] Fax: [phone] Alvera Singh 03/03/2021, 5:13 PM  [facility] [address] [place], [place], [address] Phone: [phone]   Fax:  [phone]  Name: Larry [name] MRN: [id number] Date of Birth: 1932/04/26

## 2021-03-10 ENCOUNTER — Other Ambulatory Visit: Payer: Self-pay

## 2021-03-10 ENCOUNTER — Ambulatory Visit: Payer: Medicare Other | Admitting: Physical Therapy

## 2021-03-10 DIAGNOSIS — M6281 Muscle weakness (generalized): Secondary | ICD-10-CM

## 2021-03-10 DIAGNOSIS — R2689 Other abnormalities of gait and mobility: Secondary | ICD-10-CM

## 2021-03-10 DIAGNOSIS — R296 Repeated falls: Secondary | ICD-10-CM

## 2021-03-10 DIAGNOSIS — R2681 Unsteadiness on feet: Secondary | ICD-10-CM

## 2021-03-10 NOTE — Therapy (Addendum)
[facility] [address] [place], [place], [address] Phone: [phone]   Fax:  [phone]  Physical Therapy Treatment/Discharge Summary   Patient Details  Name: Larry [name] MRN: [id number] Date of Birth: [birth year] Referring Provider (PT): Earleen Newport, MD   Encounter Date: 03/10/2021   PT End of Session - 03/10/21 1452    Visit Number 39    Date for PT Re-Evaluation 03/23/21    Authorization Type Medicare and [employer]- visit 8 for 2022  ($2110 is threshold for KX- keep an eye on this)    Progress Note Due on Visit 40    PT Start Time 1445    PT Stop Time 1527    PT Time Calculation (min) 42 min    Activity Tolerance Patient tolerated treatment well           Past Medical History:  Diagnosis Date  . Allergic rhinitis, cause unspecified 03/12/2013  . Allergy   . CAD (coronary artery disease)    S/P CABG in 1999   . Cataract   . Decreased exercise tolerance    Exertional fatigue  . Degenerative arthritis of hip   . Diverticulosis of colon   . ED (erectile dysfunction)   . Erectile dysfunction 06/03/2014  . History of colonic polyps   . History of kidney stones   . History of MI (myocardial infarction)   . Hx of adenomatous colonic polyps 2000  . Hyperlipidemia    Low HDL  . Lumbar disc disease    Hx of with recent back pain and buttock pain  . Myocardial infarction ([place]) 1999  . Right knee meniscal tear   . S/P removal of thyroid nodule   . Spinal stenosis of lumbar region 03/15/2014    Past Surgical History:  Procedure Laterality Date  . CARDIAC CATHETERIZATION    . CATARACT EXTRACTION    . COLONOSCOPY    . CORONARY ARTERY BYPASS GRAFT  1999  . CYSTOSCOPY/URETEROSCOPY/HOLMIUM LASER/STENT PLACEMENT Left 12/23/2017   Procedure: CYSTOSCOPY/RETROGRADE/URETEROSCOPY/HOLMIUM LASER/STENT PLACEMENT;  Surgeon: Festus Aloe, MD;  Location: [facility];  Service: Urology;  Laterality: Left;  ONLY  NEEDS 60 MIN  . LUMBAR DISC SURGERY    . Nodules on Thyroid    . POLYPECTOMY    . PTCA    . SHOULDER ARTHROSCOPY WITH ROTATOR CUFF REPAIR AND SUBACROMIAL DECOMPRESSION Right 01/30/2015   Procedure: RIGHT ARTHROSCOPY SHOULDER SUBACROMIAL DECOMPRESSION DISTAL CLAVICAL RESECTION RETATOR CUFF REPAIR;  Surgeon: Justice Britain, MD;  Location: [facility];  Service: Orthopedics;  Laterality: Right;  . TONSILLECTOMY    . VASECTOMY      There were no vitals filed for this visit.   Subjective Assessment - 03/10/21 1447    Subjective Walked at the park yesterday.  I have some pain in my (hamstring region) when I walk.  I think the PT has helped.    Pertinent History In 1964 had disc removed with residual right LE weakness    How long can you walk comfortably? 1 round at the [facility] takes 1 hour    Diagnostic tests Xray demonstrated lumbar scoliosis and spondylolisthesis stable from a year ago.    Patient Stated Goals Would like to improve balance to be able to walk longer distances, strengthen R calf, be able to get out of a chair, turn and bend forward without fear of falling.    Currently in Pain? No/denies    Pain Score 0-No pain  [facility] PT Assessment - 03/10/21 0001      Observation/Other Assessments   Activities of Balance Confidence Scale (ABC Scale)  73.68%                         [facility] Adult PT Treatment/Exercise - 03/10/21 0001      Therapeutic Activites    ADL's sit to stand; stairs, lifting, carrying; reaching, turning      Neuro Re-ed    Neuro Re-ed Details  outdoor ball toss against wall; ambulation on thick/soft grass; bend/stooping; stepping over and around objects; throwing/reaching; fast walking      Knee/Hip Exercises: Aerobic   Nustep L4 10 min while discussing status/progress       Knee/Hip Exercises: Machines for Strengthening   Total Gym Leg Press Seat 7: BLE 110#  2x10 reps, 60# single leg 2x10                    PT Short Term Goals  - 10/27/20 1301      PT SHORT TERM GOAL #2   Title Pt will improve RLE gastroc/soleus strength to be able to perform 2-3 single leg heel lifts to improve push off during gait    Baseline 0 single leg heel raise on Rt, Lt x 5    Time 4    Period Weeks    Status On-going             PT Long Term Goals - 03/10/21 1807      PT LONG TERM GOAL #2   Title improve ABC to > or = to 90% to improve community ambulation and safety    Status Not Met      PT LONG TERM GOAL #6   Title improve strength, endurance and stability to safely walk 20-25  minutes at the park without need to sit and rest to improve community independence    Status Achieved                 Plan - 03/10/21 1803    Clinical Impression Statement The patient has a Activities Specific Balance Confidence Scale score of 73.68% with generally high confidence with walking, reaching, bending, in/out of the car and stairs.  He lacks confidence walking in a crowded place, on/off the escalator and walking outside on icy sidewalks.  Much of the treatment session today performed on outdoor terrain on paved and grassy surfaces.  CGA for safety for no physical assist needed for loss of balance.  Will retest BERG and DGI next visit for anticipated discharge to HEP at next visit.    Comorbidities allergic rhinitis, CAD s/p CABG in 1999, OA of hip, h/o MI, adenomatous colonic polyps, HLD, lumbar disc disease, R knee meniscus tear, removal of thyroid nodule, lumbar spinal stenosis, shoulder arthroscopy wtih rotator cuff repair and subacromial decompression    Stability/Clinical Decision Making Stable/Uncomplicated    Rehab Potential Good    PT Frequency 1x / week    PT Duration 8 weeks    PT Treatment/Interventions ADLs/Self Care Home Management;Aquatic Therapy;Electrical Stimulation;DME Instruction;Gait training;Stair training;Functional mobility training;Therapeutic activities;Therapeutic exercise;Balance training;Neuromuscular  re-education;Patient/family education    PT Next Visit Plan BERG; DGI; check remaining goals for discharge next visit    PT [place]           Patient will benefit from skilled therapeutic intervention in order to improve the following deficits and impairments:  Abnormal gait,Decreased balance,Decreased strength,Difficulty walking  Visit Diagnosis: Muscle weakness (generalized)  Repeated falls  Other abnormalities of gait and mobility  Unsteadiness on feet    PHYSICAL THERAPY DISCHARGE SUMMARY  Visits from Start of Care: 39  Current functional level related to goals / functional outcomes: The patient called to cancel his last scheduled appt for planned discharge (conflicting MD appt).  He has progressed very well with balance rehab and perforing moderate to high level challenges including head turns, narrow base of support, fast speeds and eyes closed tasks.  His last balance objective tests were in the "low risk of falls" category.  He continues to walk regularly at the park.  He has met the majority of rehab goals and will discharge him from PT at this time.     Remaining deficits: As above   Education / Equipment: HEP Plan: Patient agrees to discharge.  Patient goals were met. Patient is being discharged due to meeting the stated rehab goals.  ?????         Problem List Patient Active Problem List   Diagnosis Date Noted  . Dysfunction of Eustachian tube, bilateral 07/21/2020  . Gait disorder 04/20/2016  . Hyperglycemia 04/20/2016  . Abnormal CT scan, chest 06/03/2014  . Erectile dysfunction 06/03/2014  . Spinal stenosis of lumbar region 03/15/2014  . Cough 03/12/2013  . Allergic rhinitis 03/12/2013  . Leg pain, bilateral 12/30/2011  . Bladder neck obstruction 12/30/2011  . Preventative health care 12/25/2011  . Lumbar disc disease 12/25/2011  . S/P removal of thyroid nodule 12/25/2011  . History of MI (myocardial infarction) 12/25/2011   . Right knee meniscal tear 12/25/2011  . Degenerative arthritis of hip 12/25/2011  . HIP PAIN, RIGHT 05/14/2010  . FATIGUE 10/13/2009  . HYPERTENSION, BENIGN 02/25/2009  . CAD, AUTOLOGOUS BYPASS GRAFT 02/25/2009  . CORONARY ARTERY ANEURYSM 02/25/2009  . BACK PAIN 04/15/2008  . Hyperlipidemia 02/13/2008  . CORONARY ARTERY DISEASE 02/13/2008  . DIVERTICULOSIS, COLON 02/13/2008  . CARDIAC DISEASE, HX OF 02/13/2008  . COLONIC POLYPS, HX OF 02/13/2008   Ruben Im, PT 03/10/21 6:08 PM Phone: [phone] Fax: [phone] Alvera Singh 03/10/2021, 6:08 PM  [facility] [address] [place], [place], [address] Phone: [phone]   Fax:  [phone]  Name: Larry [name] MRN: [id number] Date of Birth: 06/28/1932

## 2021-03-16 NOTE — Telephone Encounter (Signed)
Spoke with patient's wife.  Adv received message that dr. Angelena Form would like for pt to be see tomorrow to evaluate his symptoms.   She will bring him for 2:20 pm.

## 2021-03-17 ENCOUNTER — Other Ambulatory Visit: Payer: Self-pay

## 2021-03-17 ENCOUNTER — Encounter: Payer: Medicare Other | Admitting: Physical Therapy

## 2021-03-17 ENCOUNTER — Encounter: Payer: Self-pay | Admitting: Cardiovascular Disease

## 2021-03-17 ENCOUNTER — Ambulatory Visit (INDEPENDENT_AMBULATORY_CARE_PROVIDER_SITE_OTHER): Payer: Medicare Other | Admitting: Cardiovascular Disease

## 2021-03-17 VITALS — BP 136/78 | HR 78 | Ht 67.0 in | Wt 177.6 lb

## 2021-03-17 DIAGNOSIS — I493 Ventricular premature depolarization: Secondary | ICD-10-CM | POA: Diagnosis not present

## 2021-03-17 DIAGNOSIS — R202 Paresthesia of skin: Secondary | ICD-10-CM

## 2021-03-17 DIAGNOSIS — E7800 Pure hypercholesterolemia, unspecified: Secondary | ICD-10-CM | POA: Diagnosis not present

## 2021-03-17 DIAGNOSIS — R200 Anesthesia of skin: Secondary | ICD-10-CM

## 2021-03-17 DIAGNOSIS — I25810 Atherosclerosis of coronary artery bypass graft(s) without angina pectoris: Secondary | ICD-10-CM | POA: Diagnosis not present

## 2021-03-17 MED ORDER — CLOPIDOGREL BISULFATE 75 MG PO TABS: 75.0000 mg | ORAL_TABLET | Freq: Every day | ORAL | 3 refills | Status: DC

## 2021-03-17 MED ORDER — ROSUVASTATIN CALCIUM 5 MG PO TABS: ORAL_TABLET | ORAL | 3 refills | Status: DC

## 2021-03-17 NOTE — Patient Instructions (Signed)

## 2021-03-17 NOTE — Progress Notes (Signed)
Chief Complaint  Patient presents with  . Follow-up    Right arm numbness   History of Present Illness: 87 yo male with history of CAD s/p 3V CABG in 1999, HLD and PVCs here for cardiac follow up. He had 3V CABG in 1999 at [facility] in [place]. In 2008 he had a catheterization and was found to have a patent LIMA to LAD and a patent vein graft to the right coronary and no significant obstruction in the circumflex artery. Exercise stress test April 2015 with no ischemia. He has had PVCs noted on cardiac monitor and was started on Cardizem but he did not tolerate the Cardizem due to dizziness so it was stopped. Echo January 2020 with LVEF=50-55%, mild AI and mildly dilated aortic root. Chest CTA May 2021 with stable 4.3-4.4 cm ascending aortic aneurysm with slight enlargement of 4.3 cm at the sinuses of Valsalva.   He is here today for follow up. The patient denies any chest pain, dyspnea, palpitations, lower extremity edema, orthopnea, PND, dizziness, near syncope or syncope. He has been having tingling in his right arm at night and during the day. This is positional. No exertional pain in his arms. He has tingling in his right arm if he lays on his right side.   Primary Care Physician: Biagio Borg, MD  Past Medical History:  Diagnosis Date  . Allergic rhinitis, cause unspecified 03/12/2013  . Allergy   . CAD (coronary artery disease)    S/P CABG in 1999   . Cataract   . Decreased exercise tolerance    Exertional fatigue  . Degenerative arthritis of hip   . Diverticulosis of colon   . ED (erectile dysfunction)   . Erectile dysfunction 06/03/2014  . History of colonic polyps   . History of kidney stones   . History of MI (myocardial infarction)   . Hx of adenomatous colonic polyps 2000  . Hyperlipidemia    Low HDL  . Lumbar disc disease    Hx of with recent back pain and buttock pain  . Myocardial infarction ([place]) 1999  . Right knee meniscal tear   . S/P removal of  thyroid nodule   . Spinal stenosis of lumbar region 03/15/2014    Past Surgical History:  Procedure Laterality Date  . CARDIAC CATHETERIZATION    . CATARACT EXTRACTION    . COLONOSCOPY    . CORONARY ARTERY BYPASS GRAFT  1999  . CYSTOSCOPY/URETEROSCOPY/HOLMIUM LASER/STENT PLACEMENT Left 12/23/2017   Procedure: CYSTOSCOPY/RETROGRADE/URETEROSCOPY/HOLMIUM LASER/STENT PLACEMENT;  Surgeon: Festus Aloe, MD;  Location: [facility];  Service: Urology;  Laterality: Left;  ONLY NEEDS 60 MIN  . LUMBAR DISC SURGERY    . Nodules on Thyroid    . POLYPECTOMY    . PTCA    . SHOULDER ARTHROSCOPY WITH ROTATOR CUFF REPAIR AND SUBACROMIAL DECOMPRESSION Right 01/30/2015   Procedure: RIGHT ARTHROSCOPY SHOULDER SUBACROMIAL DECOMPRESSION DISTAL CLAVICAL RESECTION RETATOR CUFF REPAIR;  Surgeon: Justice Britain, MD;  Location: [place];  Service: Orthopedics;  Laterality: Right;  . TONSILLECTOMY    . VASECTOMY      Current Outpatient Medications  Medication Sig Dispense Refill  . acetaminophen (TYLENOL) 650 MG CR tablet Take 650 mg by mouth every 8 (eight) hours as needed for pain.    . calcium-vitamin D (OSCAL WITH D) 500-200 MG-UNIT per tablet Take 1 tablet by mouth 2 (two) times daily.    . cetirizine (ZYRTEC) 10 MG tablet Take 10 mg by mouth daily.    Marland Kitchen  fish oil-omega-3 fatty acids 1000 MG capsule Take 1 g by mouth 2 (two) times daily.    . folic acid (FOLVITE) 254 MCG tablet Take 400 mcg by mouth daily.    . Multiple Vitamin (MULTIVITAMIN WITH MINERALS) TABS tablet Take 1 tablet by mouth daily. Centrum Silver    . tamsulosin (FLOMAX) 0.4 MG CAPS capsule Take 1 capsule (0.4 mg total) by mouth daily. 90 capsule 1  . clopidogrel (PLAVIX) 75 MG tablet Take 1 tablet (75 mg total) by mouth daily. 90 tablet 3  . rosuvastatin (CRESTOR) 5 MG tablet Take 2 tablets daily alternating with 1 tablet every other day 135 tablet 3   No current facility-administered medications for this visit.    Allergies  Allergen Reactions   . Beta Adrenergic Blockers Other (See Comments)    Dropped blood pressure too low  . Simvastatin Other (See Comments)    myalgia    Social History   Socioeconomic History  . Marital status: Married    Spouse name: Not on file  . Number of children: 2  . Years of education: Not on file  . Highest education level: Not on file  Occupational History  . 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 Smoking status: Never Smoker  . Smokeless tobacco: Never Used  Vaping Use  . Vaping Use: Never used  Substance and Sexual Activity  . Alcohol use: Yes    Alcohol/week: 0.0 standard drinks    Comment: rare  . Drug use: No  . Sexual activity: Not Currently  Other Topics Concern  . Not on file  Social History Narrative   Moved from [facility]   Married      [employer] on file   Social Determinants of Health   Financial Resource Strain: Low Risk   . Difficulty of Paying Living Expenses: Not hard at all  Food Insecurity: No Food Insecurity  . Worried About Charity fundraiser in the Last Year: Never true  . Ran Out of Food in the Last Year: Never true  Transportation Needs: No Transportation Needs  . Lack of Transportation (Medical): No  . Lack of Transportation (Non-Medical): No  Physical Activity: Sufficiently Active  . Days of Exercise per Week: 5 days  . Minutes of Exercise per Session: 30 min  Stress: No Stress Concern Present  . Feeling of Stress : Not at all  Social Connections: Socially Integrated  . Frequency of Communication with Friends and Family: More than three times a week  . Frequency of Social Gatherings with Friends and Family: Once a week  . Attends Religious Services: 1 to 4 times per year  . Active Member of Clubs or Organizations: No  . Attends Archivist Meetings: 1 to 4 times per year  . Marital Status: Married  Human resources officer Violence: Not on file    Family History  Problem Relation Age of  Onset  . Prostate cancer Other   . Prostate cancer Other   . Heart disease Mother   . Heart disease Brother   . Colon cancer Neg Hx   . Rectal cancer Neg Hx   . Stomach cancer Neg Hx     Review of Systems:  As stated in the HPI and otherwise negative.   BP 136/78   Pulse 78   Ht 5\' 7"  (1.702 m)   Wt 177 lb 9.6 oz (80.6 kg)   SpO2 98%   BMI  27.82 kg/m   Physical Examination: General: Well developed, well nourished, NAD  HEENT: OP clear, mucus membranes moist  SKIN: warm, dry. No rashes. Neuro: No focal deficits  Musculoskeletal: Muscle strength 5/5 all ext  Psychiatric: Mood and affect normal  Neck: No JVD, no carotid bruits, no thyromegaly, no lymphadenopathy.  Lungs:Clear bilaterally, no wheezes, rhonci, crackles Cardiovascular: Regular rate and rhythm. No murmurs, gallops or rubs. Abdomen:Soft. Bowel sounds present. Non-tender.  Extremities: No lower extremity edema. Pulses are 2 + in the bilateral DP/PT.  Echo January 2020: - Left ventricle: The cavity size was normal. Wall thickness was   increased in a pattern of mild LVH. Systolic function was normal.   The estimated ejection fraction was in the range of 50% to 55%.   Wall motion was normal; there were no regional wall motion   abnormalities. Doppler parameters are consistent with abnormal   left ventricular relaxation (grade 1 diastolic dysfunction). - Aortic valve: There was mild regurgitation. - Aortic root: The aortic root was mildly dilated. - Ascending aorta: The ascending aorta was moderately dilated.  Impressions: - Low normal LV systolic function; mild LVH; mild diastolic   dysfunction; calcified aortic valve with mild AI; moderately   dilated ascending aorta (suggest CTA or MRA to further assess).  EKG:  EKG is ordered today The ekg ordered today demonstrates sinus  Recent Labs: 09/30/2020: ALT 15; BUN 23; Creatinine, Ser 1.15; Hemoglobin 16.4; Platelets 251.0; Potassium 3.9; Sodium 138; TSH 1.75    Lipid Panel    Component Value Date/Time   CHOL 112 09/30/2020 1434   CHOL 121 05/23/2018 0939   CHOL 118 04/03/2014 0843   TRIG 354.0 (H) 09/30/2020 1434   TRIG 162 (H) 04/03/2014 0843   HDL 31.70 (L) 09/30/2020 1434   HDL 34 (L) 05/23/2018 0939   HDL 36 (L) 04/03/2014 0843   CHOLHDL 4 09/30/2020 1434   VLDL 70.8 (H) 09/30/2020 1434   LDLCALC 41 09/07/2019 0955   LDLCALC 61 05/23/2018 0939   LDLCALC 50 04/03/2014 0843   LDLDIRECT 52.0 09/30/2020 1434     Wt Readings from Last 3 Encounters:  03/17/21 177 lb 9.6 oz (80.6 kg)  01/28/21 178 lb (80.7 kg)  09/30/20 177 lb (80.3 kg)     Other studies Reviewed: Additional studies/ records that were reviewed today include: . Review of the above records demonstrates:    Assessment and Plan:   1. CAD s/p CABG without angina: No chest pain. LV function normal by echo in January 2020. Continue Plavix and statin.   2. PVCs: No palpitations. He did not tolerate Cardizem in the past and does not wish to take a beta blocker or other calcium channel blocker.   3. HLD: LDL at goal in October 2021. Will continue statin.    4. Right arm numbness/tingling: He has good pulses in his right brachial artery and right radial artery. His fingers are warm with good capillary refill. I suspect that the tingling in his right arm is due to a cervical nerve issue or other neuropathy.   5. Ascending aortic aneurysm: Ascending aorta dilated at 4.4 cm by CTA chest May 2021. He does not wish to repeat this test due to his age.   Current medicines are reviewed at length with the patient today.  The patient does not have concerns regarding medicines.  The following changes have been made:  no change  Labs/ tests ordered today include:   No orders of the defined types were placed  in this encounter.  Disposition:   F/U with me in 12 months  Signed, Lauree Chandler, MD 03/17/2021 2:42 PM    [facility] [place],  [place], [place]  [address] Phone: [phone]; Fax: [phone]

## 2021-03-19 NOTE — Addendum Note (Signed)
Addended by: Mendel Ryder on: 03/19/2021 12:10 PM   Modules accepted: Orders

## 2021-03-23 DIAGNOSIS — Z23 Encounter for immunization: Secondary | ICD-10-CM | POA: Diagnosis not present

## 2021-04-20 ENCOUNTER — Ambulatory Visit: Payer: Medicare Other | Admitting: Cardiovascular Disease

## 2021-05-16 ENCOUNTER — Other Ambulatory Visit: Payer: Self-pay | Admitting: Internal Medicine

## 2021-05-16 NOTE — Telephone Encounter (Signed)
Please refill as per office routine med refill policy (all routine meds refilled for 3 mo or monthly per pt preference up to one year from last visit, then month to month grace period for 3 mo, then further med refills will have to be denied)  

## 2021-05-29 DIAGNOSIS — R030 Elevated blood-pressure reading, without diagnosis of hypertension: Secondary | ICD-10-CM | POA: Diagnosis not present

## 2021-05-29 DIAGNOSIS — R2689 Other abnormalities of gait and mobility: Secondary | ICD-10-CM | POA: Diagnosis not present

## 2021-05-29 DIAGNOSIS — Z6827 Body mass index (BMI) 27.0-27.9, adult: Secondary | ICD-10-CM | POA: Diagnosis not present

## 2021-06-04 DIAGNOSIS — M48062 Spinal stenosis, lumbar region with neurogenic claudication: Secondary | ICD-10-CM | POA: Diagnosis not present

## 2021-06-04 DIAGNOSIS — R2689 Other abnormalities of gait and mobility: Secondary | ICD-10-CM | POA: Diagnosis not present

## 2021-06-04 DIAGNOSIS — M545 Low back pain, unspecified: Secondary | ICD-10-CM | POA: Diagnosis not present

## 2021-06-05 DIAGNOSIS — N499 Inflammatory disorder of unspecified male genital organ: Secondary | ICD-10-CM | POA: Diagnosis not present

## 2021-06-05 DIAGNOSIS — R351 Nocturia: Secondary | ICD-10-CM | POA: Diagnosis not present

## 2021-06-05 DIAGNOSIS — N401 Enlarged prostate with lower urinary tract symptoms: Secondary | ICD-10-CM | POA: Diagnosis not present

## 2021-06-17 ENCOUNTER — Telehealth: Payer: Self-pay

## 2021-06-17 NOTE — Telephone Encounter (Signed)
   [facility] Pre-operative Risk Assessment    Patient Name: Larry [name]  DOB: 15-May-1932  MRN: [id number]   HEARTCARE STAFF: - Please ensure there is not already an duplicate clearance open for this procedure. - Under Visit Info/Reason for Call, type in Other and utilize the format Clearance MM/DD/YY or Clearance TBD. Do not use dashes or single digits. - If request is for dental extraction, please clarify the # of teeth to be extracted. - If the patient is currently at the dentist's office, call Pre-Op APP to address. If the patient is not currently in the dentist office, please route to the Pre-Op pool  Request for surgical clearance:  What type of surgery is being performed? Bilateral L3-4 Laminectomy/ Foraminotomy   When is this surgery scheduled? TBD   What type of clearance is required (medical clearance vs. Pharmacy clearance to hold med vs. Both)? Both   Are there any medications that need to be held prior to surgery and how long?Plavix   Practice name and name of physician performing surgery? [facility]   Dr Kristeen Miss   What is the office phone number? [phone] ( Office contact person Janett Billow)    7.   What is the office fax number? [phone]  8.   Anesthesia type (None, local, MAC, general) ? None specified    Ulice Brilliant T 06/17/2021, 11:35 AM  _________________________________________________________________   (provider comments below)

## 2021-06-18 NOTE — Telephone Encounter (Signed)
   Name: Larry [name]  DOB: 10/02/1932  MRN: [id number]  Primary Cardiologist: Lauree Chandler, MD  Chart reviewed as part of pre-operative protocol coverage. Because of [name] past medical history and time since last visit, he will require a follow-up visit in order to better assess preoperative cardiovascular risk.   Spoke with patient's wife - he is able to complete more than 4.0 METS without angina. However, she is very concerned about risk of surgery and would prefer to speak with Dr. Angelena Form directly. I let her know I would ask scheduling to get him in ASAP.   Pre-op covering staff: - Please schedule appointment and call patient to inform them. If patient already had an upcoming appointment within acceptable timeframe, please add "pre-op clearance" to the appointment notes so provider is aware. - Please contact requesting surgeon's office via preferred method (i.e, phone, fax) to inform them of need for appointment prior to surgery.  If applicable, this message will also be routed to pharmacy pool and/or primary cardiologist for input on holding anticoagulant/antiplatelet agent as requested below so that this information is available to the clearing provider at time of patient's appointment.   Tami Lin Zorian Gunderman, PA  06/18/2021, 9:13 AM

## 2021-06-18 NOTE — Telephone Encounter (Signed)
I have myself have reviewed the schedule for Dr. Angelena Form and I do not see any availability with out a time slot being held. Dr. Camillia Herter nurse is not available at this time to see if she may squeeze the pt in somewhere for pre op clearance, see note from Doreene Adas, [facility] pre op provider. Note was sent to MD as to if he would be able to see the pt for pre op.

## 2021-06-19 NOTE — Telephone Encounter (Signed)
   Primary Cardiologist: Lauree Chandler, MD  Chart reviewed as part of pre-operative protocol coverage. Given past medical history and time since last visit, based on ACC/AHA guidelines, Verne Lanuza would be at acceptable risk for the planned procedure without further cardiovascular testing.   His Plavix may be held for 7 days prior to his procedure.  Please resume as soon as hemostasis is achieved.  I will route this recommendation to the requesting party via Epic fax function and remove from pre-op pool.  Please call with questions.  Jossie Ng. Allycia Pitz NP-C    06/19/2021, 4:40 PM [facility] [address] Office [phone] Fax [phone]

## 2021-06-23 ENCOUNTER — Other Ambulatory Visit: Payer: Self-pay | Admitting: Neurological Surgery

## 2021-07-01 NOTE — Progress Notes (Signed)
Surgical Instructions    Your procedure is scheduled on 07/06/21.  Report to [facility] Main Entrance "A" at 05:30 A.M., then check in with the Admitting office.  Call this number if you have problems the morning of surgery:  [phone]   If you have any questions prior to your surgery date call [phone]: Open Monday-Friday 8am-4pm    Remember:  Do not eat after midnight the night before your surgery  You may drink clear liquids until 04:30am the morning of your surgery.   Clear liquids allowed are: Water, Non-Citrus Juices (without pulp), Carbonated Beverages, Clear Tea, Black Coffee Only, and Gatorade    Take these medicines the morning of surgery with A SIP OF WATER  acetaminophen (TYLENOL) if needed cetirizine (ZYRTEC)  rosuvastatin (CRESTOR)  tamsulosin (FLOMAX)  As of today, STOP taking any (unless otherwise instructed by your surgeon) Aleve, Naproxen, Ibuprofen, Motrin, Advil, Goody's, BC's, all herbal medications, fish oil, and all vitamins.  Please stop taking clopidogrel (PLAVIX) 7 days prior to surgery. Your last dose will be 06/28/21.           Do not wear jewelry or makeup Do not wear lotions, powders, colognes, or deodorant. Men may shave face and neck. Do not bring valuables to the hospital.  DO Not wear nail polish, gel polish, artificial nails, or any other type of covering on natural nails  including finger and toenails. If patients have artificial nails, gel coating, etc. that need to be removed by a nail salon please have this removed prior to surgery or surgery may need to be canceled/delayed if the surgeon/ anesthesia feels like the patient is unable to be adequately monitored.             [facility] is not responsible for any belongings or valuables.  Do NOT Smoke (Tobacco/Vaping) or drink Alcohol 24 hours prior to your procedure If you use a CPAP at night, you may bring all equipment for your overnight stay.   Contacts, glasses, dentures or  bridgework may not be worn into surgery, please bring cases for these belongings   For patients admitted to the hospital, discharge time will be determined by your treatment team.   Patients discharged the day of surgery will not be allowed to drive home, and someone needs to stay with them for 24 hours.  ONLY 1 SUPPORT PERSON MAY BE PRESENT WHILE YOU ARE IN SURGERY. IF YOU ARE TO BE ADMITTED ONCE YOU ARE IN YOUR ROOM YOU WILL BE ALLOWED TWO (2) VISITORS.  Minor children may have two parents present. Special consideration for safety and communication needs will be reviewed on a case by case basis.  Special instructions:    Oral Hygiene is also important to reduce your risk of infection.  Remember - BRUSH YOUR TEETH THE MORNING OF SURGERY WITH YOUR REGULAR TOOTHPASTE   [place]- Preparing For Surgery  Before surgery, you can play an important role. Because skin is not sterile, your skin needs to be as free of germs as possible. You can reduce the number of germs on your skin by washing with CHG (chlorahexidine gluconate) Soap before surgery.  CHG is an antiseptic cleaner which kills germs and bonds with the skin to continue killing germs even after washing.     Please do not use if you have an allergy to CHG or antibacterial soaps. If your skin becomes reddened/irritated stop using the CHG.  Do not shave (including legs and underarms) for at least 48 hours  prior to first CHG shower. It is OK to shave your face.  Please follow these instructions carefully.     Shower the NIGHT BEFORE SURGERY and the MORNING OF SURGERY with CHG Soap.   If you chose to wash your hair, wash your hair first as usual with your normal shampoo. After you shampoo, rinse your hair and body thoroughly to remove the shampoo.  Then [employer] and genitals (private parts) with your normal soap and rinse thoroughly to remove soap.  After that Use CHG Soap as you would any other liquid soap. You can apply CHG directly to  the skin and wash gently with a scrungie or a clean washcloth.   Apply the CHG Soap to your body ONLY FROM THE NECK DOWN.  Do not use on open wounds or open sores. Avoid contact with your eyes, ears, mouth and genitals (private parts). Wash Face and genitals (private parts)  with your normal soap.   Wash thoroughly, paying special attention to the area where your surgery will be performed.  Thoroughly rinse your body with warm water from the neck down.  DO NOT shower/wash with your normal soap after using and rinsing off the CHG Soap.  Pat yourself dry with a CLEAN TOWEL.  Wear CLEAN PAJAMAS to bed the night before surgery  Place CLEAN SHEETS on your bed the night before your surgery  DO NOT SLEEP WITH PETS.   Day of Surgery: Take a shower with CHG soap. Wear Clean/Comfortable clothing the morning of surgery Do not apply any deodorants/lotions.   Remember to brush your teeth WITH YOUR REGULAR TOOTHPASTE.   Please read over the following fact sheets that you were given.

## 2021-07-02 ENCOUNTER — Other Ambulatory Visit: Payer: Self-pay

## 2021-07-02 ENCOUNTER — Encounter (HOSPITAL_COMMUNITY): Payer: Self-pay

## 2021-07-02 ENCOUNTER — Encounter (HOSPITAL_COMMUNITY)
Admission: RE | Admit: 2021-07-02 | Discharge: 2021-07-02 | Disposition: A | Discharge status: Home / Self Care | Payer: Medicare Other | Source: Ambulatory Visit | Attending: Neurological Surgery | Admitting: Neurological Surgery

## 2021-07-02 DIAGNOSIS — Z01812 Encounter for preprocedural laboratory examination: Secondary | ICD-10-CM | POA: Diagnosis not present

## 2021-07-02 DIAGNOSIS — Z20822 Contact with and (suspected) exposure to covid-19: Secondary | ICD-10-CM | POA: Diagnosis not present

## 2021-07-02 LAB — SURGICAL PCR SCREEN
MRSA, PCR: NEGATIVE
Staphylococcus aureus: NEGATIVE

## 2021-07-02 LAB — BASIC METABOLIC PANEL
Anion gap: 12 (ref 5–15)
BUN: 17 mg/dL (ref 8–23)
CO2: 21 mmol/L — ABNORMAL LOW (ref 22–32)
Calcium: 9 mg/dL (ref 8.9–10.3)
Chloride: 104 mmol/L (ref 98–111)
Creatinine, Ser: 1.11 mg/dL (ref 0.61–1.24)
GFR, Estimated: >60 mL/min (ref >60)
Glucose, Bld: 111 mg/dL — ABNORMAL HIGH (ref 70–99)
Potassium: 4.2 mmol/L (ref 3.5–5.1)
Sodium: 137 mmol/L (ref 135–145)

## 2021-07-02 LAB — CBC
HCT: 51.4 % (ref 39.0–52.0)
Hemoglobin: 16.9 g/dL (ref 13.0–17.0)
MCH: 30.9 pg (ref 26.0–34.0)
MCHC: 32.9 g/dL (ref 30.0–36.0)
MCV: 94 fL (ref 80.0–100.0)
Platelets: 227 10*3/uL (ref 150–400)
RBC: 5.47 MIL/uL (ref 4.22–5.81)
RDW: 12.8 % (ref 11.5–15.5)
WBC: 8 10*3/uL (ref 4.0–10.5)
nRBC: 0 % (ref 0.0–0.2)

## 2021-07-02 LAB — SARS CORONAVIRUS 2 (TAT 6-24 HRS): SARS Coronavirus 2: NEGATIVE

## 2021-07-02 NOTE — Progress Notes (Signed)
PCP - Cathlean Cower Cardiologist - Lauree Chandler   PPM/ICD - denies   Chest x-ray - n/a EKG - 03/17/21 Stress Test - 04/10/14 ECHO - 01/08/19 Cardiac Cath - sometime between 1999-2012  Sleep Study - denies   No diabetes  As of today, STOP taking any (unless otherwise instructed by your surgeon) Aleve, Naproxen, Ibuprofen, Motrin, Advil, Goody's, BC's, all herbal medications, fish oil, and all vitamins.   Please stop taking clopidogrel (PLAVIX) 7 days prior to surgery. Your last dose will be 06/28/21.  ERAS Protcol -yes PRE-SURGERY Ensure or G2- no ensure  COVID TEST- completed in PAT 07/02/21   Anesthesia review: yes, cardiac clearance note in epic  Patient denies shortness of breath, fever, cough and chest pain at PAT appointment   All instructions explained to the patient, with a verbal understanding of the material. Patient agrees to go over the instructions while at home for a better understanding. Patient also instructed to self quarantine after being tested for COVID-19. The opportunity to ask questions was provided.

## 2021-07-03 NOTE — Anesthesia Preprocedure Evaluation (Addendum)
Anesthesia Evaluation  Patient identified by MRN, date of birth, ID band Patient awake    Reviewed: Allergy & Precautions, NPO status , Patient's Chart, lab work & pertinent test results  Airway Mallampati: I  TM Distance: >3 FB Neck ROM: Full    Dental no notable dental hx. (+) Teeth Intact, Dental Advisory Given   Pulmonary neg pulmonary ROS,    Pulmonary exam normal breath sounds clear to auscultation       Cardiovascular hypertension, + CAD, + Past MI and + CABG  Normal cardiovascular exam Rhythm:Regular Rate:Normal  CTA chest/aorta 04/22/2020: IMPRESSION: Slight enlargement of the aortic root at the level of the sinuses of Valsalva to approximately 4.3-4.4 cm. Previous diameter was approximately 4.1-4.2 cm. The ascending thoracic aorta shows stable maximal diameter of 4.4 cm.  TTE 01/08/2019: - Left ventricle: The cavity size was normal. Wall thickness was  increased in a pattern of mild LVH. Systolic function was normal.  The estimated ejection fraction was in the range of 50% to 55%.  Wall motion was normal; there were no regional wall motion  abnormalities. Doppler parameters are consistent with abnormal  left ventricular relaxation (grade 1 diastolic dysfunction).  - Aortic valve: There was mild regurgitation.  - Aortic root: The aortic root was mildly dilated.  - Ascending aorta: The ascending aorta was moderately dilated.   Impressions:   - Low normal LV systolic function; mild LVH; mild diastolic  dysfunction; calcified aortic valve with mild AI; moderately  dilated ascending aorta (suggest CTA or MRA to further assess   Neuro/Psych negative neurological ROS  negative psych ROS   GI/Hepatic negative GI ROS, Neg liver ROS,   Endo/Other  negative endocrine ROS  Renal/GU negative Renal ROS  negative genitourinary   Musculoskeletal  (+) Arthritis ,   Abdominal   Peds  Hematology  (+)  Blood dyscrasia (on plavix, last dose 06/29/21), ,   Anesthesia Other Findings   Reproductive/Obstetrics                            Anesthesia Physical Anesthesia Plan  ASA: 3  Anesthesia Plan: General   Post-op Pain Management:    Induction: Intravenous  PONV Risk Score and Plan: Midazolam, Dexamethasone and Ondansetron  Airway Management Planned: Oral ETT  Additional Equipment:   Intra-op Plan:   Post-operative Plan: Extubation in OR  Informed Consent: I have reviewed the patients History and Physical, chart, labs and discussed the procedure including the risks, benefits and alternatives for the proposed anesthesia with the patient or authorized representative who has indicated his/her understanding and acceptance.     Dental advisory given  Plan Discussed with: CRNA  Anesthesia Plan Comments: (PAT note by Karoline Caldwell, PA-C: Follows with cardiology for history of CAD s/p three-vessel CABG 1999, HLD, PVCs. In 2008 he had a catheterization and was found to have a patent LIMA to LAD and a patent vein graft to the right coronary and no significant obstruction in the circumflex artery. Exercise stress test April 2015 with no ischemia. He has had PVCs noted on cardiac monitor and was started on Cardizem but he did not tolerate the Cardizem due to dizziness so it was stopped. Echo January 2020 with LVEF=50-55%, mild AI and mildly dilated aortic root. Chest CTA May 2021withstable 4.3-4.4 cm ascending aortic aneurysmwith slight enlargement of 4.3 cm at the sinuses of Valsalva. Last seen by Dr. Angelena Form 03/17/2021, stable at that time, no angina, no palpitations.  Advised to follow-up in 1 year.  Cardiac clearance per telephone encounter 06/19/2021, "Chart reviewed as part of pre-operative protocol coverage. Given past medical history and time since last visit, based on ACC/AHA guidelines,Larry Bogardwould be at acceptable risk for the planned procedure without  further cardiovascular testing. His Plavix may be held for 7 days prior to his procedure. Please resume as soon as hemostasis is achieved."  Preop labs reviewed, unremarkable.  EKG 03/17/2021: Sinus rhythm.  Rate 78.  CTA chest/aorta 04/22/2020: IMPRESSION: Slight enlargement of the aortic root at the level of the sinuses of Valsalva to approximately 4.3-4.4 cm. Previous diameter was approximately 4.1-4.2 cm. The ascending thoracic aorta shows stable maximal diameter of 4.4 cm.  TTE 01/08/2019: - Left ventricle: The cavity size was normal. Wall thickness was  increased in a pattern of mild LVH. Systolic function was normal.  The estimated ejection fraction was in the range of 50% to 55%.  Wall motion was normal; there were no regional wall motion  abnormalities. Doppler parameters are consistent with abnormal  left ventricular relaxation (grade 1 diastolic dysfunction).  - Aortic valve: There was mild regurgitation.  - Aortic root: The aortic root was mildly dilated.  - Ascending aorta: The ascending aorta was moderately dilated.   Impressions:   - Low normal LV systolic function; mild LVH; mild diastolic  dysfunction; calcified aortic valve with mild AI; moderately  dilated ascending aorta (suggest CTA or MRA to further assess).  )       Anesthesia Quick Evaluation

## 2021-07-03 NOTE — Progress Notes (Signed)
Anesthesia Chart Review:  Follows with cardiology for history of CAD s/p three-vessel CABG 1999, HLD, PVCs.  In 2008 he had a catheterization and was found to have a patent LIMA to LAD and a patent vein graft to the right coronary and no significant obstruction in the circumflex artery. Exercise stress test April 2015 with no ischemia. He has had PVCs noted on cardiac monitor and was started on Cardizem but he did not tolerate the Cardizem due to dizziness so it was stopped. Echo January 2020 with LVEF=50-55%, mild AI and mildly dilated aortic root. Chest CTA May 2021 with stable 4.3-4.4 cm ascending aortic aneurysm with slight enlargement of 4.3 cm at the sinuses of Valsalva.  Last seen by Dr. Angelena Form 03/17/2021, stable at that time, no angina, no palpitations.  Advised to follow-up in 1 year.  Cardiac clearance per telephone encounter 06/19/2021, "Chart reviewed as part of pre-operative protocol coverage. Given past medical history and time since last visit, based on ACC/AHA guidelines, Jerin Franzel would be at acceptable risk for the planned procedure without further cardiovascular testing.  His Plavix may be held for 7 days prior to his procedure.  Please resume as soon as hemostasis is achieved."  Preop labs reviewed, unremarkable.  EKG 03/17/2021: Sinus rhythm.  Rate 78.  CTA chest/aorta 04/22/2020: IMPRESSION: Slight enlargement of the aortic root at the level of the sinuses of Valsalva to approximately 4.3-4.4 cm. Previous diameter was approximately 4.1-4.2 cm. The ascending thoracic aorta shows stable maximal diameter of 4.4 cm.  TTE 01/08/2019: - Left ventricle: The cavity size was normal. Wall thickness was    increased in a pattern of mild LVH. Systolic function was normal.    The estimated ejection fraction was in the range of 50% to 55%.    Wall motion was normal; there were no regional wall motion    abnormalities. Doppler parameters are consistent with abnormal    left ventricular  relaxation (grade 1 diastolic dysfunction).  - Aortic valve: There was mild regurgitation.  - Aortic root: The aortic root was mildly dilated.  - Ascending aorta: The ascending aorta was moderately dilated.   Impressions:   - Low normal LV systolic function; mild LVH; mild diastolic    dysfunction; calcified aortic valve with mild AI; moderately    dilated ascending aorta (suggest CTA or MRA to further assess).    Wynonia Musty [facility] Short Stay Center/Anesthesiology Phone [phone] 07/03/2021 8:49 AM

## 2021-07-06 ENCOUNTER — Observation Stay (HOSPITAL_COMMUNITY)
Admission: RE | Admit: 2021-07-06 | Discharge: 2021-07-07 | Disposition: A | Discharge status: Home / Self Care | Payer: Medicare Other | Attending: Neurological Surgery | Admitting: Neurological Surgery

## 2021-07-06 ENCOUNTER — Encounter (HOSPITAL_COMMUNITY)
Admission: RE | Disposition: A | Discharge status: Home / Self Care | Payer: Self-pay | Source: Home / Self Care | Attending: Neurological Surgery

## 2021-07-06 ENCOUNTER — Encounter (HOSPITAL_COMMUNITY): Payer: Self-pay | Admitting: Neurological Surgery

## 2021-07-06 ENCOUNTER — Ambulatory Visit (HOSPITAL_COMMUNITY): Payer: Medicare Other | Admitting: Anesthesiology

## 2021-07-06 ENCOUNTER — Ambulatory Visit (HOSPITAL_COMMUNITY): Payer: Medicare Other

## 2021-07-06 ENCOUNTER — Ambulatory Visit (HOSPITAL_COMMUNITY): Payer: Medicare Other | Admitting: Physician Assistant

## 2021-07-06 ENCOUNTER — Other Ambulatory Visit: Payer: Self-pay

## 2021-07-06 DIAGNOSIS — M4726 Other spondylosis with radiculopathy, lumbar region: Secondary | ICD-10-CM | POA: Diagnosis not present

## 2021-07-06 DIAGNOSIS — M5116 Intervertebral disc disorders with radiculopathy, lumbar region: Secondary | ICD-10-CM | POA: Diagnosis not present

## 2021-07-06 DIAGNOSIS — Z9889 Other specified postprocedural states: Secondary | ICD-10-CM | POA: Diagnosis not present

## 2021-07-06 DIAGNOSIS — I2510 Atherosclerotic heart disease of native coronary artery without angina pectoris: Secondary | ICD-10-CM | POA: Insufficient documentation

## 2021-07-06 DIAGNOSIS — I252 Old myocardial infarction: Secondary | ICD-10-CM | POA: Diagnosis not present

## 2021-07-06 DIAGNOSIS — I10 Essential (primary) hypertension: Secondary | ICD-10-CM | POA: Diagnosis not present

## 2021-07-06 DIAGNOSIS — Z951 Presence of aortocoronary bypass graft: Secondary | ICD-10-CM | POA: Diagnosis not present

## 2021-07-06 DIAGNOSIS — M48062 Spinal stenosis, lumbar region with neurogenic claudication: Principal | ICD-10-CM | POA: Insufficient documentation

## 2021-07-06 DIAGNOSIS — Z79899 Other long term (current) drug therapy: Secondary | ICD-10-CM | POA: Insufficient documentation

## 2021-07-06 DIAGNOSIS — Z419 Encounter for procedure for purposes other than remedying health state, unspecified: Secondary | ICD-10-CM

## 2021-07-06 HISTORY — PX: LUMBAR LAMINECTOMY/DECOMPRESSION MICRODISCECTOMY: SHX5026

## 2021-07-06 SURGERY — LUMBAR LAMINECTOMY/DECOMPRESSION MICRODISCECTOMY 1 LEVEL
Anesthesia: General | Site: Back | Laterality: Bilateral

## 2021-07-06 MED ORDER — CHLORHEXIDINE GLUCONATE 0.12 % MT SOLN
15.0000 mL | Freq: Once | OROMUCOSAL | Status: AC
  Administered 2021-07-06: 15 mL via OROMUCOSAL
  Filled 2021-07-06: qty 15

## 2021-07-06 MED ORDER — LACTATED RINGERS IV SOLN: INTRAVENOUS | Status: DC

## 2021-07-06 MED ORDER — METHOCARBAMOL 1000 MG/10ML IJ SOLN
500.0000 mg | Freq: Four times a day (QID) | INTRAVENOUS | Status: DC | PRN
  Filled 2021-07-06: qty 5

## 2021-07-06 MED ORDER — DOCUSATE SODIUM 100 MG PO CAPS
100.0000 mg | ORAL_CAPSULE | Freq: Two times a day (BID) | ORAL | Status: DC
  Administered 2021-07-06: 100 mg via ORAL
  Filled 2021-07-06: qty 1

## 2021-07-06 MED ORDER — LORATADINE 10 MG PO TABS
10.0000 mg | ORAL_TABLET | Freq: Every day | ORAL | Status: DC
  Administered 2021-07-06: 10 mg via ORAL
  Filled 2021-07-06: qty 1

## 2021-07-06 MED ORDER — PSEUDOEPHEDRINE HCL ER 120 MG PO TB12
120.0000 mg | ORAL_TABLET | Freq: Two times a day (BID) | ORAL | Status: DC
  Administered 2021-07-06: 120 mg via ORAL
  Filled 2021-07-06 (×4): qty 1

## 2021-07-06 MED ORDER — ROCURONIUM BROMIDE 10 MG/ML (PF) SYRINGE
PREFILLED_SYRINGE | INTRAVENOUS | Status: DC | PRN
  Administered 2021-07-06: 10 mg via INTRAVENOUS
  Administered 2021-07-06: 60 mg via INTRAVENOUS

## 2021-07-06 MED ORDER — ONDANSETRON HCL 4 MG/2ML IJ SOLN
INTRAMUSCULAR | Status: DC | PRN
  Administered 2021-07-06: 4 mg via INTRAVENOUS

## 2021-07-06 MED ORDER — LIDOCAINE 2% (20 MG/ML) 5 ML SYRINGE
INTRAMUSCULAR | Status: DC | PRN
Start: 2021-07-06 — End: 2021-07-06
  Administered 2021-07-06: 60 mg via INTRAVENOUS

## 2021-07-06 MED ORDER — ACETAMINOPHEN 325 MG PO TABS
650.0000 mg | ORAL_TABLET | ORAL | Status: DC | PRN
  Administered 2021-07-07: 650 mg via ORAL
  Filled 2021-07-06: qty 2

## 2021-07-06 MED ORDER — OXYCODONE-ACETAMINOPHEN 5-325 MG PO TABS
1.0000 | ORAL_TABLET | ORAL | Status: DC | PRN
Start: 2021-07-06 — End: 2021-07-07
  Administered 2021-07-06 (×2): 2 via ORAL
  Administered 2021-07-06: 1 via ORAL
  Administered 2021-07-07: 2 via ORAL
  Filled 2021-07-06: qty 2
  Filled 2021-07-06: qty 1
  Filled 2021-07-06 (×2): qty 2

## 2021-07-06 MED ORDER — EPHEDRINE SULFATE-NACL 50-0.9 MG/10ML-% IV SOSY
PREFILLED_SYRINGE | INTRAVENOUS | Status: DC | PRN
  Administered 2021-07-06: 10 mg via INTRAVENOUS
  Administered 2021-07-06: 5 mg via INTRAVENOUS

## 2021-07-06 MED ORDER — EPHEDRINE 5 MG/ML INJ
INTRAVENOUS | Status: AC
  Filled 2021-07-06: qty 5

## 2021-07-06 MED ORDER — CHLORHEXIDINE GLUCONATE CLOTH 2 % EX PADS: 6.0000 | MEDICATED_PAD | Freq: Once | CUTANEOUS | Status: DC

## 2021-07-06 MED ORDER — FENTANYL CITRATE (PF) 250 MCG/5ML IJ SOLN
INTRAMUSCULAR | Status: DC | PRN
  Administered 2021-07-06 (×3): 50 ug via INTRAVENOUS

## 2021-07-06 MED ORDER — ACETAMINOPHEN 500 MG PO TABS
1000.0000 mg | ORAL_TABLET | Freq: Once | ORAL | Status: AC
  Administered 2021-07-06: 1000 mg via ORAL
  Filled 2021-07-06: qty 2

## 2021-07-06 MED ORDER — ROSUVASTATIN CALCIUM 5 MG PO TABS: 10.0000 mg | ORAL_TABLET | ORAL | Status: DC

## 2021-07-06 MED ORDER — METHOCARBAMOL 500 MG PO TABS
500.0000 mg | ORAL_TABLET | Freq: Four times a day (QID) | ORAL | Status: DC | PRN
  Administered 2021-07-06 – 2021-07-07 (×2): 500 mg via ORAL
  Filled 2021-07-06 (×2): qty 1

## 2021-07-06 MED ORDER — SENNA 8.6 MG PO TABS
1.0000 | ORAL_TABLET | Freq: Two times a day (BID) | ORAL | Status: DC
  Administered 2021-07-06: 8.6 mg via ORAL
  Filled 2021-07-06: qty 1

## 2021-07-06 MED ORDER — BUPIVACAINE LIPOSOME 1.3 % IJ SUSP
INTRAMUSCULAR | Status: AC
  Filled 2021-07-06: qty 20

## 2021-07-06 MED ORDER — THROMBIN 5000 UNITS EX SOLR
CUTANEOUS | Status: AC
  Filled 2021-07-06: qty 5000

## 2021-07-06 MED ORDER — ONDANSETRON HCL 4 MG PO TABS: 4.0000 mg | ORAL_TABLET | Freq: Four times a day (QID) | ORAL | Status: DC | PRN

## 2021-07-06 MED ORDER — ONDANSETRON HCL 4 MG/2ML IJ SOLN
4.0000 mg | Freq: Four times a day (QID) | INTRAMUSCULAR | Status: DC | PRN
Start: 2021-07-06 — End: 2021-07-07

## 2021-07-06 MED ORDER — ORAL CARE MOUTH RINSE: 15.0000 mL | Freq: Once | OROMUCOSAL | Status: AC

## 2021-07-06 MED ORDER — THROMBIN 5000 UNITS EX SOLR
OROMUCOSAL | Status: DC | PRN
  Administered 2021-07-06: 5 mL via TOPICAL

## 2021-07-06 MED ORDER — PROPOFOL 10 MG/ML IV BOLUS
INTRAVENOUS | Status: AC
  Filled 2021-07-06: qty 20

## 2021-07-06 MED ORDER — MORPHINE SULFATE (PF) 2 MG/ML IV SOLN: 2.0000 mg | INTRAVENOUS | Status: DC | PRN

## 2021-07-06 MED ORDER — LIDOCAINE 2% (20 MG/ML) 5 ML SYRINGE
INTRAMUSCULAR | Status: AC
  Filled 2021-07-06: qty 5

## 2021-07-06 MED ORDER — PHENYLEPHRINE HCL-NACL 10-0.9 MG/250ML-% IV SOLN
INTRAVENOUS | Status: DC | PRN
  Administered 2021-07-06: 20 ug/min via INTRAVENOUS

## 2021-07-06 MED ORDER — CEFAZOLIN SODIUM-DEXTROSE 2-4 GM/100ML-% IV SOLN
2.0000 g | Freq: Three times a day (TID) | INTRAVENOUS | Status: AC
  Administered 2021-07-06 (×2): 2 g via INTRAVENOUS
  Filled 2021-07-06 (×2): qty 100

## 2021-07-06 MED ORDER — PROPOFOL 10 MG/ML IV BOLUS
INTRAVENOUS | Status: DC | PRN
  Administered 2021-07-06: 100 mg via INTRAVENOUS

## 2021-07-06 MED ORDER — BUPIVACAINE HCL (PF) 0.5 % IJ SOLN
INTRAMUSCULAR | Status: AC
  Filled 2021-07-06: qty 30

## 2021-07-06 MED ORDER — ALUM & MAG HYDROXIDE-SIMETH 200-200-20 MG/5ML PO SUSP
30.0000 mL | Freq: Four times a day (QID) | ORAL | Status: DC | PRN
  Administered 2021-07-07: 30 mL via ORAL
  Filled 2021-07-06: qty 30

## 2021-07-06 MED ORDER — SODIUM CHLORIDE 0.9% FLUSH
3.0000 mL | Freq: Two times a day (BID) | INTRAVENOUS | Status: DC
  Administered 2021-07-06 (×2): 3 mL via INTRAVENOUS

## 2021-07-06 MED ORDER — GUAIFENESIN ER 600 MG PO TB12
1200.0000 mg | ORAL_TABLET | Freq: Two times a day (BID) | ORAL | Status: DC
  Administered 2021-07-06: 1200 mg via ORAL
  Filled 2021-07-06 (×4): qty 2

## 2021-07-06 MED ORDER — CEFAZOLIN SODIUM-DEXTROSE 2-4 GM/100ML-% IV SOLN
2.0000 g | INTRAVENOUS | Status: AC
  Administered 2021-07-06: 2 g via INTRAVENOUS
  Filled 2021-07-06: qty 100

## 2021-07-06 MED ORDER — ONDANSETRON HCL 4 MG/2ML IJ SOLN
INTRAMUSCULAR | Status: AC
  Filled 2021-07-06: qty 2

## 2021-07-06 MED ORDER — LIDOCAINE-EPINEPHRINE 1 %-1:100000 IJ SOLN
INTRAMUSCULAR | Status: DC | PRN
  Administered 2021-07-06: 5 mL

## 2021-07-06 MED ORDER — 0.9 % SODIUM CHLORIDE (POUR BTL) OPTIME
TOPICAL | Status: DC | PRN
  Administered 2021-07-06: 1000 mL

## 2021-07-06 MED ORDER — POLYETHYLENE GLYCOL 3350 17 G PO PACK: 17.0000 g | PACK | Freq: Every day | ORAL | Status: DC | PRN

## 2021-07-06 MED ORDER — BISACODYL 10 MG RE SUPP: 10.0000 mg | Freq: Every day | RECTAL | Status: DC | PRN

## 2021-07-06 MED ORDER — ROSUVASTATIN CALCIUM 5 MG PO TABS
5.0000 mg | ORAL_TABLET | ORAL | Status: DC
  Administered 2021-07-06: 5 mg via ORAL
  Filled 2021-07-06: qty 1

## 2021-07-06 MED ORDER — FENTANYL CITRATE (PF) 250 MCG/5ML IJ SOLN
INTRAMUSCULAR | Status: AC
  Filled 2021-07-06: qty 5

## 2021-07-06 MED ORDER — SUGAMMADEX SODIUM 200 MG/2ML IV SOLN
INTRAVENOUS | Status: DC | PRN
  Administered 2021-07-06: 160 mg via INTRAVENOUS

## 2021-07-06 MED ORDER — PHENYLEPHRINE 40 MCG/ML (10ML) SYRINGE FOR IV PUSH (FOR BLOOD PRESSURE SUPPORT)
PREFILLED_SYRINGE | INTRAVENOUS | Status: AC
  Filled 2021-07-06: qty 10

## 2021-07-06 MED ORDER — DEXAMETHASONE SODIUM PHOSPHATE 10 MG/ML IJ SOLN
INTRAMUSCULAR | Status: AC
  Filled 2021-07-06: qty 1

## 2021-07-06 MED ORDER — ROCURONIUM BROMIDE 10 MG/ML (PF) SYRINGE
PREFILLED_SYRINGE | INTRAVENOUS | Status: AC
  Filled 2021-07-06: qty 10

## 2021-07-06 MED ORDER — ACETAMINOPHEN 650 MG RE SUPP: 650.0000 mg | RECTAL | Status: DC | PRN

## 2021-07-06 MED ORDER — BUPIVACAINE HCL (PF) 0.5 % IJ SOLN
INTRAMUSCULAR | Status: DC | PRN
  Administered 2021-07-06: 5 mL
  Administered 2021-07-06: 25 mL

## 2021-07-06 MED ORDER — ROSUVASTATIN CALCIUM 5 MG PO TABS
5.0000 mg | ORAL_TABLET | ORAL | Status: DC
Start: 2021-07-06 — End: 2021-07-06

## 2021-07-06 MED ORDER — PHENYLEPHRINE 40 MCG/ML (10ML) SYRINGE FOR IV PUSH (FOR BLOOD PRESSURE SUPPORT)
PREFILLED_SYRINGE | INTRAVENOUS | Status: DC | PRN
  Administered 2021-07-06: 200 ug via INTRAVENOUS

## 2021-07-06 MED ORDER — MENTHOL 3 MG MT LOZG: 1.0000 | LOZENGE | OROMUCOSAL | Status: DC | PRN

## 2021-07-06 MED ORDER — TAMSULOSIN HCL 0.4 MG PO CAPS
0.4000 mg | ORAL_CAPSULE | Freq: Every day | ORAL | Status: DC
  Administered 2021-07-06: 0.4 mg via ORAL
  Filled 2021-07-06: qty 1

## 2021-07-06 MED ORDER — LIDOCAINE-EPINEPHRINE 1 %-1:100000 IJ SOLN
INTRAMUSCULAR | Status: AC
  Filled 2021-07-06: qty 1

## 2021-07-06 MED ORDER — SODIUM CHLORIDE 0.9% FLUSH: 3.0000 mL | INTRAVENOUS | Status: DC | PRN

## 2021-07-06 MED ORDER — PHENOL 1.4 % MT LIQD: 1.0000 | OROMUCOSAL | Status: DC | PRN

## 2021-07-06 MED ORDER — DEXAMETHASONE SODIUM PHOSPHATE 10 MG/ML IJ SOLN
INTRAMUSCULAR | Status: DC | PRN
  Administered 2021-07-06: 10 mg via INTRAVENOUS

## 2021-07-06 MED ORDER — FLEET ENEMA 7-19 GM/118ML RE ENEM: 1.0000 | ENEMA | Freq: Once | RECTAL | Status: DC | PRN

## 2021-07-06 MED ORDER — FENTANYL CITRATE (PF) 100 MCG/2ML IJ SOLN: 25.0000 ug | INTRAMUSCULAR | Status: DC | PRN

## 2021-07-06 MED ORDER — SODIUM CHLORIDE 0.9 % IV SOLN
250.0000 mL | INTRAVENOUS | Status: DC
  Administered 2021-07-06: 250 mL via INTRAVENOUS

## 2021-07-06 SURGICAL SUPPLY — 51 items
ADH SKN CLS APL DERMABOND .7 (GAUZE/BANDAGES/DRESSINGS) ×1
BAG COUNTER SPONGE SURGICOUNT (BAG) ×2 IMPLANT
BAG SPNG CNTER NS LX DISP (BAG) ×1
BAND INSRT 18 STRL LF DISP RB (MISCELLANEOUS) ×2
BAND RUBBER #18 3X1/16 STRL (MISCELLANEOUS) ×2 IMPLANT
BLADE CLIPPER SURG (BLADE) IMPLANT
BUR ACORN 6.0 (BURR) ×1 IMPLANT
BUR MATCHSTICK NEURO 3.0 LAGG (BURR) ×2 IMPLANT
CANISTER SUCT 3000ML PPV (MISCELLANEOUS) ×2 IMPLANT
DECANTER SPIKE VIAL GLASS SM (MISCELLANEOUS) ×2 IMPLANT
DERMABOND ADVANCED (GAUZE/BANDAGES/DRESSINGS) ×1
DERMABOND ADVANCED .7 DNX12 (GAUZE/BANDAGES/DRESSINGS) ×1 IMPLANT
DEVICE DISSECT PLASMABLAD 3.0S (MISCELLANEOUS) ×1 IMPLANT
DRAPE HALF SHEET 40X57 (DRAPES) IMPLANT
DRAPE LAPAROTOMY T 102X78X121 (DRAPES) ×2 IMPLANT
DRAPE MICROSCOPE LEICA (MISCELLANEOUS) ×1 IMPLANT
DURAPREP 26ML APPLICATOR (WOUND CARE) ×2 IMPLANT
ELECT REM PT RETURN 9FT ADLT (ELECTROSURGICAL) ×2
ELECTRODE REM PT RTRN 9FT ADLT (ELECTROSURGICAL) ×1 IMPLANT
GAUZE 4X4 16PLY ~~LOC~~+RFID DBL (SPONGE) ×1 IMPLANT
GAUZE SPONGE 4X4 12PLY STRL (GAUZE/BANDAGES/DRESSINGS) ×2 IMPLANT
GLOVE SURG LTX SZ8.5 (GLOVE) ×2 IMPLANT
GLOVE SURG UNDER POLY LF SZ6.5 (GLOVE) ×1 IMPLANT
GLOVE SURG UNDER POLY LF SZ7 (GLOVE) ×1 IMPLANT
GLOVE SURG UNDER POLY LF SZ8.5 (GLOVE) ×2 IMPLANT
GOWN STRL REUS W/ TWL LRG LVL3 (GOWN DISPOSABLE) IMPLANT
GOWN STRL REUS W/ TWL XL LVL3 (GOWN DISPOSABLE) IMPLANT
GOWN STRL REUS W/TWL 2XL LVL3 (GOWN DISPOSABLE) ×2 IMPLANT
GOWN STRL REUS W/TWL LRG LVL3 (GOWN DISPOSABLE) ×2
GOWN STRL REUS W/TWL XL LVL3 (GOWN DISPOSABLE)
KIT BASIN OR (CUSTOM PROCEDURE TRAY) ×2 IMPLANT
KIT TURNOVER KIT B (KITS) ×2 IMPLANT
NDL SPNL 20GX3.5 QUINCKE YW (NEEDLE) IMPLANT
NEEDLE HYPO 22GX1.5 SAFETY (NEEDLE) ×2 IMPLANT
NEEDLE SPNL 20GX3.5 QUINCKE YW (NEEDLE) ×2 IMPLANT
NS IRRIG 1000ML POUR BTL (IV SOLUTION) ×2 IMPLANT
PACK LAMINECTOMY NEURO (CUSTOM PROCEDURE TRAY) ×2 IMPLANT
PAD ARMBOARD 7.5X6 YLW CONV (MISCELLANEOUS) ×6 IMPLANT
PATTIES SURGICAL .5 X1 (DISPOSABLE) ×2 IMPLANT
PLASMABLADE 3.0S (MISCELLANEOUS) ×2
SPONGE LAP 4X18 RFD (DISPOSABLE) IMPLANT
SPONGE SURGIFOAM ABS GEL SZ50 (HEMOSTASIS) ×2 IMPLANT
SPONGE T-LAP 4X18 ~~LOC~~+RFID (SPONGE) ×2 IMPLANT
SUT VIC AB 1 CT1 18XBRD ANBCTR (SUTURE) ×1 IMPLANT
SUT VIC AB 1 CT1 8-18 (SUTURE) ×2
SUT VIC AB 2-0 CP2 18 (SUTURE) ×2 IMPLANT
SUT VIC AB 3-0 SH 8-18 (SUTURE) ×2 IMPLANT
SUT VIC AB 4-0 RB1 18 (SUTURE) ×2 IMPLANT
TOWEL GREEN STERILE (TOWEL DISPOSABLE) ×2 IMPLANT
TOWEL GREEN STERILE FF (TOWEL DISPOSABLE) ×2 IMPLANT
WATER STERILE IRR 1000ML POUR (IV SOLUTION) ×2 IMPLANT

## 2021-07-06 NOTE — Anesthesia Postprocedure Evaluation (Signed)
Anesthesia Post Note  Patient: Larry [name]  Procedure(s) Performed: Bilateral Lumbar three-four Laminectomy and foraminotomy (Bilateral: Back)     Patient location during evaluation: PACU Anesthesia Type: General Level of consciousness: awake and alert Pain management: pain level controlled Vital Signs Assessment: post-procedure vital signs reviewed and stable Respiratory status: spontaneous breathing, nonlabored ventilation, respiratory function stable and patient connected to nasal cannula oxygen Cardiovascular status: blood pressure returned to baseline and stable Postop Assessment: no apparent nausea or vomiting Anesthetic complications: no   No notable events documented.  Last Vitals:  Vitals:   07/06/21 1015 07/06/21 1045  BP: 132/81 131/78  Pulse: 69   Resp: 10 18  Temp: (!) 36.2 C   SpO2: 92%     Last Pain:  Vitals:   07/06/21 1015  TempSrc:   PainSc: 0-No pain                 Mykell Genao L Gabrielly Mccrystal

## 2021-07-06 NOTE — Op Note (Signed)
Date of surgery: 07/06/2021 Preoperative diagnosis: Spondylosis with stenosis L3-L4, with neurogenic claudication.  Lumbar radiculopathy. Postoperative diagnosis: Same Surgeon: Kristeen Miss Procedure: Bilateral laminotomies and decompression L3-L4 Anesthesia: General endotracheal Indications: Larry [name] is an 87 year old individual whose had progressive difficulty with walking he has had weakness in his legs proximally and he notes that he can only walk about a few steps at a time.  He has had a progressive's stenosis at L3-L4 that has been observed and is noted to be getting worse.  Has been advised regarding the need for surgical decompression.  Procedure: Patient was brought to the operating room supine on the stretcher.  After the smooth induction of general endotracheal anesthesia, he was carefully turned prone.  The back was prepped with alcohol DuraPrep and draped in a sterile fashion.  Needle localization was used to identify L3-L4 with a radiograph.  A vertical incision was created in this region and then carried down to the lumbodorsal fascia the fascia was opened on either side of midline to expose the laminar arch and interlaminar space at L3-L4.  A second localizing radiograph was obtained with a small laminotomy being created at the L3-4 level this verified the L3-4 level.  Laminotomy was first enlarged on the right side and thickened redundant yellow ligament was encountered.  This was taken up carefully to open the central portion of of the spinal canal.  Then the dissection was carried out laterally and inferiorly with removal of the superiormost portion of the laminar arch of L4.  Redundant yellow ligament was taken out of this region and lateral gutters were then cleared to clear the path of the L for nerve root inferiorly.  Common dural tube was then dissected and dissection was carried cephalad to make sure that an adequate decompression was performed under the laminotomy of the L3  laminar arch.  Once this was completed the procedure was completed on the opposite side.  Hemostasis was checked carefully and the area was inspected under the operating microscope.  Some further laminotomy and a little bit of removal of tissue was performed to facilitate an ample decompression once it was verified 25 cc of half percent Marcaine was injected into the paraspinous fascia and the fascia was closed with #1 Vicryl in interrupted fashion 2-0 Vicryl was used in the subcutaneous tissues 3-0 Vicryl subcuticularly.  Dermabond was placed on the skin.  Blood loss was estimated at 100 cc.

## 2021-07-06 NOTE — Transfer of Care (Signed)
Immediate Anesthesia Transfer of Care Note  Patient: Larry [name]  Procedure(s) Performed: Bilateral Lumbar three-four Laminectomy and foraminotomy (Bilateral: Back)  Patient Location: PACU  Anesthesia Type:General  Level of Consciousness: awake and patient cooperative  Airway & Oxygen Therapy: Patient Spontanous Breathing and Patient connected to face mask oxygen  Post-op Assessment: Report given to RN and Post -op Vital signs reviewed and stable  Post vital signs: Reviewed and stable  Last Vitals:  Vitals Value Taken Time  BP 140/86 07/06/21 0944  Temp    Pulse 74 07/06/21 0949  Resp 15 07/06/21 0949  SpO2 91 % 07/06/21 0949  Vitals shown include unvalidated device data.  Last Pain:  Vitals:   07/06/21 0622  TempSrc:   PainSc: 0-No pain      Patients Stated Pain Goal: 2 (26/71/24 5809)  Complications: No notable events documented.

## 2021-07-06 NOTE — H&P (Signed)
Larry [name] is an 87 y.o. male.   Chief Complaint: bilateral leg weakness and pain  HPI: Patient is an 87 yo man with a hx of stenosis at L3-4. It has been treated conservatively but recently he has developed weakness and worsening symptoms of neurogenic claudication. An MRI demonstrates that he has worsened the stenosis at L3-4 and he is now admitted for surgery.  Past Medical History:  Diagnosis Date   Allergic rhinitis, cause unspecified 03/12/2013   Allergy    CAD (coronary artery disease)    S/P CABG in 1999    Cataract    Decreased exercise tolerance    Exertional fatigue   Degenerative arthritis of hip    Diverticulosis of colon    ED (erectile dysfunction)    Erectile dysfunction 06/03/2014   History of colonic polyps    History of kidney stones    History of MI (myocardial infarction)    Hx of adenomatous colonic polyps 2000   Hyperlipidemia    Low HDL   Lumbar disc disease    Hx of with recent back pain and buttock pain   Myocardial infarction ([place]) 1999   Right knee meniscal tear    S/P removal of thyroid nodule    Spinal stenosis of lumbar region 03/15/2014    Past Surgical History:  Procedure Laterality Date   BACK SURGERY  1960   CARDIAC CATHETERIZATION     CATARACT EXTRACTION     COLONOSCOPY     CORONARY ARTERY BYPASS GRAFT  1999   CYSTOSCOPY/URETEROSCOPY/HOLMIUM LASER/STENT PLACEMENT Left 12/23/2017   Procedure: CYSTOSCOPY/RETROGRADE/URETEROSCOPY/HOLMIUM LASER/STENT PLACEMENT;  Surgeon: Festus Aloe, MD;  Location: [facility];  Service: Urology;  Laterality: Left;  ONLY NEEDS 60 MIN   EYE SURGERY Bilateral    bilaterl cataract removal   LUMBAR DISC SURGERY     Nodules on Thyroid     POLYPECTOMY     PTCA     SHOULDER ARTHROSCOPY WITH ROTATOR CUFF REPAIR AND SUBACROMIAL DECOMPRESSION Right 01/30/2015   Procedure: RIGHT ARTHROSCOPY SHOULDER SUBACROMIAL DECOMPRESSION DISTAL CLAVICAL RESECTION RETATOR CUFF REPAIR;  Surgeon: Justice Britain, MD;  Location:  [place];  Service: Orthopedics;  Laterality: Right;   TONSILLECTOMY     VASECTOMY      Family History  Problem Relation Age of Onset   Prostate cancer Other    Prostate cancer Other    Heart disease Mother    Heart disease Brother    Colon cancer Neg Hx    Rectal cancer Neg Hx    Stomach cancer Neg Hx    Social History:  reports that he has never smoked. He has never used smokeless tobacco. He reports current alcohol use. He reports that he does not use drugs.  Allergies:  Allergies  Allergen Reactions   Beta Adrenergic Blockers Other (See Comments)    Dropped blood pressure too low   Simvastatin Other (See Comments)    myalgia    Medications Prior to Admission  Medication Sig Dispense Refill   acetaminophen (TYLENOL) 650 MG CR tablet Take 650 mg by mouth every 8 (eight) hours as needed for pain.     calcium-vitamin D (OSCAL WITH D) 500-200 MG-UNIT per tablet Take 1 tablet by mouth 2 (two) times daily.     cetirizine (ZYRTEC) 10 MG tablet Take 10 mg by mouth daily.     clopidogrel (PLAVIX) 75 MG tablet Take 1 tablet (75 mg total) by mouth daily. 90 tablet 3   fish oil-omega-3 fatty acids 1000  MG capsule Take 1 g by mouth 2 (two) times daily.     folic acid (FOLVITE) 545 MCG tablet Take 400 mcg by mouth daily.     Guaifenesin (MUCINEX MAXIMUM STRENGTH) 1200 MG TB12 Take 1,200 mg by mouth in the morning and at bedtime.     Multiple Vitamin (MULTIVITAMIN WITH MINERALS) TABS tablet Take 1 tablet by mouth daily. Centrum Silver     rosuvastatin (CRESTOR) 10 MG tablet Take 5-10 mg by mouth See admin instructions. Alternating 10 mg with 5 mg every other day     tamsulosin (FLOMAX) 0.4 MG CAPS capsule TAKE 1 CAPSULE EVERY DAY (Patient taking differently: Take 0.4 mg by mouth daily.) 90 capsule 1    No results found for this or any previous visit (from the past 48 hour(s)). No results found.  Review of Systems  Constitutional:  Positive for activity change.  HENT: Negative.    Eyes:  Negative.   Respiratory: Negative.    Cardiovascular: Negative.   Gastrointestinal: Negative.   Endocrine: Negative.   Genitourinary: Negative.   Musculoskeletal:  Positive for back pain, gait problem and myalgias.  Allergic/Immunologic: Negative.   Neurological:  Positive for weakness and numbness.  Hematological: Negative.   Psychiatric/Behavioral: Negative.     Blood pressure (!) 146/77, pulse 77, temperature 97.9 F (36.6 C), temperature source Oral, resp. rate 18, height 5\' 7"  (1.702 m), weight 78.9 kg, SpO2 93 %. Physical Exam Constitutional:      Appearance: Normal appearance.  HENT:     Head: Normocephalic and atraumatic.     Nose: Nose normal.     Mouth/Throat:     Mouth: Mucous membranes are moist.  Eyes:     Extraocular Movements: Extraocular movements intact.     Pupils: Pupils are equal, round, and reactive to light.  Cardiovascular:     Rate and Rhythm: Normal rate and regular rhythm.     Pulses: Normal pulses.     Heart sounds: Normal heart sounds.  Pulmonary:     Effort: Pulmonary effort is normal.     Breath sounds: Normal breath sounds.  Abdominal:     General: Abdomen is flat. Bowel sounds are normal.     Palpations: Abdomen is soft.  Musculoskeletal:        General: Normal range of motion.     Cervical back: Normal range of motion and neck supple.  Skin:    General: Skin is warm and dry.     Capillary Refill: Capillary refill takes less than 2 seconds.  Neurological:     Mental Status: He is alert.     Comments: Mild weakness proximally in quads, absent reflexes in LE's distal strength intact      Assessment/Plan Lumbar spinal stenosis at L3-4 with neurogenic claudication. Plan: decompression via laminotomies at L3-4.  Earleen Newport, MD 07/06/2021, 7:29 AM

## 2021-07-06 NOTE — Anesthesia Procedure Notes (Signed)
Procedure Name: Intubation Date/Time: 07/06/2021 7:53 AM Performed by: Renato Shin, CRNA Pre-anesthesia Checklist: Patient identified, Emergency Drugs available, Suction available and Patient being monitored Patient Re-evaluated:Patient Re-evaluated prior to induction Oxygen Delivery Method: Circle system utilized Preoxygenation: Pre-oxygenation with 100% oxygen Induction Type: IV induction Ventilation: Mask ventilation without difficulty Laryngoscope Size: Miller and 3 Grade View: Grade I Tube type: Oral Tube size: 7.5 mm Number of attempts: 1 Airway Equipment and Method: Stylet and Oral airway Placement Confirmation: ETT inserted through vocal cords under direct vision, positive ETCO2 and breath sounds checked- equal and bilateral Secured at: 21 cm Tube secured with: Tape Dental Injury: Teeth and Oropharynx as per pre-operative assessment

## 2021-07-07 ENCOUNTER — Encounter (HOSPITAL_COMMUNITY): Payer: Self-pay | Admitting: Neurological Surgery

## 2021-07-07 DIAGNOSIS — M48062 Spinal stenosis, lumbar region with neurogenic claudication: Secondary | ICD-10-CM | POA: Diagnosis not present

## 2021-07-07 MED ORDER — OXYCODONE-ACETAMINOPHEN 5-325 MG PO TABS: 1.0000 | ORAL_TABLET | ORAL | 0 refills | Status: DC | PRN

## 2021-07-07 MED ORDER — METHOCARBAMOL 500 MG PO TABS: 500.0000 mg | ORAL_TABLET | Freq: Four times a day (QID) | ORAL | 3 refills | Status: DC | PRN

## 2021-07-07 MED ORDER — DEXAMETHASONE 1 MG PO TABS: ORAL_TABLET | ORAL | 0 refills | Status: DC

## 2021-07-07 NOTE — Care Management Obs Status (Signed)
[place] NOTIFICATION   Patient Details  Name: Larry [name] MRN: [id number] Date of Birth: 06-05-1932   Medicare Observation Status Notification Given:  Yes    Joanne Chars, LCSW 07/07/2021, 9:48 AM

## 2021-07-07 NOTE — Progress Notes (Signed)
Patient alert and oriented, voiding adequately, MAE well with no difficulty. Incision area cdi with no s/s of infection. Patient discharged home per order. Patient and spouse stated understanding of discharge instructions given. Patient has an appointment with Dr. Elsner 

## 2021-07-07 NOTE — Discharge Summary (Addendum)
Physician Discharge Summary  Patient ID: Larry [name] MRN: [id number] DOB/AGE: August 22, 1932 87 y.o.  Admit date: 07/06/2021 Discharge date: 07/07/2021  Admission Diagnoses: Lumbar spinal stenosis with neurogenic claudication  Discharge Diagnoses: Lumbar spinal stenosis with neurogenic claudication Active Problems:   Spinal stenosis, lumbar region, with neurogenic claudication   Spinal stenosis of lumbar region with neurogenic claudication   Discharged Condition: good  Hospital Course: Patient admitted Surgery tolerated as well.  Consults: None  Significant Diagnostic Studies: None  Treatments: surgery: See op note  Discharge Exam: Blood pressure 124/71, pulse 61, temperature 97.8 F (36.6 C), temperature source Oral, resp. rate 16, height 5\' 7"  (1.702 m), weight 78.9 kg, SpO2 96 %. Incision clean motor function is intact  Disposition: Discharge disposition: 01-Home or Self Care      Discharge Instructions     Call MD for:  redness, tenderness, or signs of infection (pain, swelling, redness, odor or green/yellow discharge around incision site)   Complete by: As directed    Call MD for:  severe uncontrolled pain   Complete by: As directed    Call MD for:  temperature >100.4   Complete by: As directed    Diet - low sodium heart healthy   Complete by: As directed    Discharge wound care:   Complete by: As directed    Okay to shower. Do not apply salves or appointments to incision. No heavy lifting with the upper extremities greater than 10 pounds. May resume driving when not requiring pain medication and patient feels comfortable with doing so.   Incentive spirometry RT   Complete by: As directed    Increase activity slowly   Complete by: As directed          Follow-up Information     Kristeen Miss, MD Follow up.   Specialty: Neurosurgery Contact information: [address] [place] [address] [place] [address] [phone]                  Signed: Earleen Newport 07/07/2021, 9:23 AM

## 2021-07-07 NOTE — Care Management CC44 (Signed)
Condition Code 44 Documentation Completed  Patient Details  Name: Larry [name] MRN: [id number] Date of Birth: 01/16/1932   Condition Code 44 given:  Yes Patient signature on Condition Code 44 notice:  Yes Documentation of 2 MD's agreement:  Yes Code 44 added to claim:  Yes    Joanne Chars, LCSW 07/07/2021, 9:49 AM

## 2021-07-07 NOTE — Evaluation (Signed)
Occupational Therapy Evaluation/Discharge  Patient Details Name: Larry [name] MRN: [id number] DOB: 1932-02-10 Today's Date: 07/07/2021    History of Present Illness 87 y.o male s/p bilateral L3-4 laminectomy and foraminotomy. PMH significant for CAD, cataract, arthritis of hip, kidney stones, MI, back surgery, spinal stenosis, and R shoulder arthroscopy.   Clinical Impression   PTA, pt lived with his wife who performed cooking and cleaning. Currently, pt performing all ADLs with supervision for safety. Pt educated and demonstrating use of compensatory techniques for LB dressing, shower transfer, oral care, and toileting. Pt and wife verbalizing and demonstrating understanding. All questions answered. Recommend discharge home with no follow up OT. Re-consult if change in status. OT to sign off.     Follow Up Recommendations  No OT follow up    Equipment Recommendations  Other (comment) (RW)    Recommendations for Other Services       Precautions / Restrictions Precautions Precautions: Back Precaution Booklet Issued: Yes (comment) Precaution Comments: Pt recalling 3/3 back precautions. Required Braces or Orthoses:  (no brace) Restrictions Weight Bearing Restrictions: No      Mobility Bed Mobility               General bed mobility comments: pt in recliner upon arrival; pt and wife recalling log roll technique and verbalizing understanding    Transfers Overall transfer level: Needs assistance Equipment used: Rolling walker (2 wheeled) Transfers: Sit to/from Stand Sit to Stand: Supervision         General transfer comment: supervision for safety    Balance Overall balance assessment: Needs assistance Sitting-balance support: No upper extremity supported;Feet supported Sitting balance-Leahy Scale: Good Sitting balance - Comments: donning LB dressing sitting in recliner   Standing balance support: Bilateral upper extremity supported;During functional  activity Standing balance-Leahy Scale: Fair Standing balance comment: RW                           ADL either performed or assessed with clinical judgement   ADL Overall ADL's : Needs assistance/impaired                                       General ADL Comments: Pt performing all ADLs with supervision for safety. Pt educated and demonstrating use of compensatory techniques for dressing, shower transfers, oral care, and toileting.     Vision   Vision Assessment?: No apparent visual deficits     Perception Perception Perception Tested?: No Comments: no apparent difficulty with perception   Praxis Praxis Praxis tested?: Not tested Praxis-Other Comments: no apparent difficulty with motor planning.    Pertinent Vitals/Pain Pain Assessment: Faces Faces Pain Scale: Hurts a little bit Pain Location: back Pain Descriptors / Indicators: Operative site guarding Pain Intervention(s): Limited activity within patient's tolerance;Monitored during session     Hand Dominance     Extremity/Trunk Assessment Upper Extremity Assessment Upper Extremity Assessment: Generalized weakness   Lower Extremity Assessment Lower Extremity Assessment: Defer to PT evaluation   Cervical / Trunk Assessment Cervical / Trunk Assessment: Other exceptions Cervical / Trunk Exceptions: s/p lumbar spinal surgery   Communication Communication Communication: No difficulties   Cognition Arousal/Alertness: Awake/alert Behavior During Therapy: WFL for tasks assessed/performed Overall Cognitive Status: Within Functional Limits for tasks assessed  General Comments: pt reporting STM difficulty, however, recalling 3/3 back precautions   General Comments  Pt wife present and asking questions throughout session with good insight into pt limitations.    Exercises     Shoulder Instructions      Home Living Family/patient expects to be  discharged to:: Private residence Living Arrangements: Spouse/significant other Available Help at Discharge: Family Type of Home: Apartment (condo)       Home Layout: Able to live on main level with bedroom/bathroom     Bathroom Shower/Tub: Walk-in shower;Door         Home Equipment: Kasandra Knudsen - single point          Prior Functioning/Environment Level of Independence: Independent        Comments: Uses single point cane at baseline, and wife performs most IADLs. Pt occasionally drives        OT Problem List: Decreased strength;Decreased activity tolerance;Impaired balance (sitting and/or standing);Pain;Decreased knowledge of precautions;Decreased knowledge of use of DME or AE      OT Treatment/Interventions:      OT Goals(Current goals can be found in the care plan section) Acute Rehab OT Goals Patient Stated Goal: go home OT Goal Formulation: With patient/family  OT Frequency:     Barriers to D/C:            Co-evaluation              AM-PAC OT "6 Clicks" Daily Activity     Outcome Measure Help from another person eating meals?: A Little Help from another person taking care of personal grooming?: A Little Help from another person toileting, which includes using toliet, bedpan, or urinal?: A Little Help from another person bathing (including washing, rinsing, drying)?: A Little Help from another person to put on and taking off regular upper body clothing?: A Little Help from another person to put on and taking off regular lower body clothing?: A Little 6 Click Score: 18   End of Session Equipment Utilized During Treatment: Gait belt;Rolling walker Nurse Communication: Mobility status  Activity Tolerance: Patient tolerated treatment well Patient left: in chair;with call bell/phone within reach  OT Visit Diagnosis: Unsteadiness on feet (R26.81);Muscle weakness (generalized) (M62.81);Pain Pain - Right/Left: Right Pain - part of body:  (back)                 Time: 7616-0737 OT Time Calculation (min): 21 min Charges:  OT General Charges $OT Visit: 1 Visit OT Evaluation $OT Eval Low Complexity: 1 Low  Shanda Howells, OTDS   Shanda Howells 07/07/2021, 9:12 AM

## 2021-07-07 NOTE — Evaluation (Signed)
Physical Therapy Evaluation Patient Details Name: Larry [name] MRN: [id number] DOB: [birth year] Today's Date: 07/07/2021   History of Present Illness  Pt is an 87 y/o male who presents s/p bilateral L3-4 laminectomy and foraminotomy on 07/06/2021. PMH significant for CAD, cataract, arthritis of hip, kidney stones, MI, back surgery, spinal stenosis, and R shoulder arthroscopy.   Clinical Impression  Pt admitted with above diagnosis. At the time of PT eval, pt was able to demonstrate transfers and ambulation with gross min guard assist to supervision for safety with RW for support. Pt was educated on precautions, positioning recommendations, appropriate activity progression, and car transfer. Pt currently with functional limitations due to the deficits listed below (see PT Problem List). Pt will benefit from skilled PT to increase their independence and safety with mobility to allow discharge to the venue listed below.      Follow Up Recommendations No PT follow up;Supervision for mobility/OOB    Equipment Recommendations  Rolling walker with 5" wheels    Recommendations for Other Services       Precautions / Restrictions Precautions Precautions: Back Precaution Booklet Issued: Yes (comment) Precaution Comments: Reviewed handout and pt was cued for precautions during functional mobility. Required Braces or Orthoses:  (no brace needed order) Restrictions Weight Bearing Restrictions: No      Mobility  Bed Mobility               General bed mobility comments: Reviewed log roll verbally. Pt was received sitting up in the recliner chair.    Transfers Overall transfer level: Needs assistance Equipment used: Rolling walker (2 wheeled) Transfers: Sit to/from Stand Sit to Stand: Supervision         General transfer comment: VC's for hand placement on seated surface for safety. No assist required for power-up to full stand.  Ambulation/Gait Ambulation/Gait assistance:  Supervision Gait Distance (Feet): 400 Feet Assistive device: Rolling walker (2 wheeled) Gait Pattern/deviations: Step-through pattern;Decreased stride length;Trunk flexed Gait velocity: Decreased Gait velocity interpretation: <1.8 ft/sec, indicate of risk for recurrent falls General Gait Details: VC's for improved posture, closer walker proximity, and forward gaze. No gross unsteadiness or LOB noted.  Stairs Stairs: Yes Stairs assistance: Min guard;Min assist Stair Management: One rail Left;Step to pattern;Forwards Number of Stairs: 6 General stair comments: VC's for sequencing and general safety. Min guard as pt ascended stairs, and min assist as pt descended stairs.  Wheelchair Mobility    Modified Rankin (Stroke Patients Only)       Balance Overall balance assessment: Needs assistance Sitting-balance support: No upper extremity supported;Feet supported Sitting balance-Leahy Scale: Good Sitting balance - Comments: donning LB dressing sitting in recliner   Standing balance support: Bilateral upper extremity supported;During functional activity Standing balance-Leahy Scale: Poor Standing balance comment: Reliant on RW for support during dynamic activity.                             Pertinent Vitals/Pain Pain Assessment: Faces Faces Pain Scale: Hurts a little bit Pain Location: back Pain Descriptors / Indicators: Operative site guarding Pain Intervention(s): Limited activity within patient's tolerance;Monitored during session;Repositioned    Home Living Family/patient expects to be discharged to:: Private residence Living Arrangements: Spouse/significant other Available Help at Discharge: Family Type of Home: Apartment (condo) Home Access: Stairs to enter Entrance Stairs-Rails: Left Entrance Stairs-Number of Steps: 6 Home Layout: Able to live on main level with bedroom/bathroom Home Equipment: Cane - single point  Prior Function Level of  Independence: Independent         Comments: Uses single point cane at baseline, and wife performs most IADLs. Pt occasionally drives     Hand Dominance        Extremity/Trunk Assessment   Upper Extremity Assessment Upper Extremity Assessment: Defer to OT evaluation    Lower Extremity Assessment Lower Extremity Assessment: Generalized weakness    Cervical / Trunk Assessment Cervical / Trunk Assessment: Other exceptions Cervical / Trunk Exceptions: s/p lumbar spinal surgery  Communication   Communication: No difficulties  Cognition Arousal/Alertness: Awake/alert Behavior During Therapy: WFL for tasks assessed/performed Overall Cognitive Status: Within Functional Limits for tasks assessed                                 General Comments: pt reporting STM difficulty, however, recalling 3/3 back precautions      General Comments General comments (skin integrity, edema, etc.): Pt wife present and asking questions throughout session with good insight into pt limitations.    Exercises     Assessment/Plan    PT Assessment Patient needs continued PT services  PT Problem List Decreased strength;Decreased activity tolerance;Decreased balance;Decreased mobility;Decreased knowledge of use of DME;Decreased safety awareness;Decreased knowledge of precautions;Pain       PT Treatment Interventions DME instruction;Gait training;Stair training;Functional mobility training;Therapeutic activities;Therapeutic exercise;Neuromuscular re-education;Patient/family education    PT Goals (Current goals can be found in the Care Plan section)  Acute Rehab PT Goals Patient Stated Goal: Get back to walking in the park and using his exercise bike PT Goal Formulation: With patient/family Time For Goal Achievement: 07/14/21 Potential to Achieve Goals: Good    Frequency Min 5X/week   Barriers to discharge        Co-evaluation               AM-PAC PT "6 Clicks"  Mobility  Outcome Measure Help needed turning from your back to your side while in a flat bed without using bedrails?: None Help needed moving from lying on your back to sitting on the side of a flat bed without using bedrails?: A Little Help needed moving to and from a bed to a chair (including a wheelchair)?: A Little Help needed standing up from a chair using your arms (e.g., wheelchair or bedside chair)?: A Little Help needed to walk in hospital room?: A Little Help needed climbing 3-5 steps with a railing? : A Little 6 Click Score: 19    End of Session Equipment Utilized During Treatment: Gait belt Activity Tolerance: Patient tolerated treatment well Patient left: in chair;with call bell/phone within reach;with family/visitor present Nurse Communication: Mobility status PT Visit Diagnosis: Unsteadiness on feet (R26.81);Pain Pain - part of body:  (back)    Time: 0852-0920 PT Time Calculation (min) (ACUTE ONLY): 28 min   Charges:   PT Evaluation $PT Eval Low Complexity: 1 Low PT Treatments $Gait Training: 8-22 mins        Rolinda Roan, PT, DPT Acute Rehabilitation Services Pager: [phone] Office: [phone]   Thelma Comp 07/07/2021, 10:31 AM

## 2021-07-07 NOTE — Discharge Instructions (Signed)

## 2021-08-04 ENCOUNTER — Other Ambulatory Visit: Payer: Self-pay

## 2021-08-04 ENCOUNTER — Ambulatory Visit: Payer: Medicare Other | Attending: Neurological Surgery | Admitting: Physical Therapy

## 2021-08-04 DIAGNOSIS — R262 Difficulty in walking, not elsewhere classified: Secondary | ICD-10-CM | POA: Insufficient documentation

## 2021-08-04 DIAGNOSIS — M6281 Muscle weakness (generalized): Secondary | ICD-10-CM | POA: Diagnosis not present

## 2021-08-04 DIAGNOSIS — M545 Low back pain, unspecified: Secondary | ICD-10-CM | POA: Diagnosis not present

## 2021-08-04 NOTE — Patient Instructions (Signed)
Access Code: [email or web address] URL: [email or web address] Date: 08/04/2021 Prepared by: Ruben Im  Exercises Hooklying Single Knee to Chest Stretch - 1 x daily - 7 x weekly - 1 sets - 3 reps - 10 hold Supine Sciatic Nerve Glide - 1 x daily - 7 x weekly - 1 sets - 10 reps Seated Transversus Abdominis Bracing with PLB - 1 x daily - 7 x weekly - 1 sets - 10 reps Standing Heel Raise with Support - 1 x daily - 7 x weekly - 1 sets - 10 reps Push ups on kitchen counter - 1 x daily - 7 x weekly - 1 sets - 10 reps

## 2021-08-04 NOTE — Therapy (Signed)
[facility] [address], [place], [place], [address] Phone: [phone]   Fax:  [phone]  Physical Therapy Evaluation  Patient Details  Name: Larry [name] MRN: [id number] Date of Birth: 06/13/1932 Referring Provider (PT): Dr. Ellene Route   Encounter Date: 08/04/2021   PT End of Session - 08/04/21 1958     Visit Number 1    Date for PT Re-Evaluation 09/29/21    Authorization Type Medicare    PT Start Time 1100    PT Stop Time 1143    PT Time Calculation (min) 43 min    Activity Tolerance Patient tolerated treatment well             Past Medical History:  Diagnosis Date   Allergic rhinitis, cause unspecified 03/12/2013   Allergy    CAD (coronary artery disease)    S/P CABG in 1999    Cataract    Decreased exercise tolerance    Exertional fatigue   Degenerative arthritis of hip    Diverticulosis of colon    ED (erectile dysfunction)    Erectile dysfunction 06/03/2014   History of colonic polyps    History of kidney stones    History of MI (myocardial infarction)    Hx of adenomatous colonic polyps 2000   Hyperlipidemia    Low HDL   Lumbar disc disease    Hx of with recent back pain and buttock pain   Myocardial infarction ([place]) 1999   Right knee meniscal tear    S/P removal of thyroid nodule    Spinal stenosis of lumbar region 03/15/2014    Past Surgical History:  Procedure Laterality Date   [place]   CYSTOSCOPY/URETEROSCOPY/HOLMIUM LASER/STENT PLACEMENT Left 12/23/2017   Procedure: CYSTOSCOPY/RETROGRADE/URETEROSCOPY/HOLMIUM LASER/STENT PLACEMENT;  Surgeon: Festus Aloe, MD;  Location: [facility];  Service: Urology;  Laterality: Left;  ONLY NEEDS 60 MIN   EYE SURGERY Bilateral    bilaterl cataract removal   LUMBAR DISC SURGERY     LUMBAR LAMINECTOMY/DECOMPRESSION  MICRODISCECTOMY Bilateral 07/06/2021   Procedure: Bilateral Lumbar three-four Laminectomy and foraminotomy;  Surgeon: Kristeen Miss, MD;  Location: [place];  Service: Neurosurgery;  Laterality: Bilateral;   Nodules on Thyroid     POLYPECTOMY     PTCA     SHOULDER ARTHROSCOPY WITH ROTATOR CUFF REPAIR AND SUBACROMIAL DECOMPRESSION Right 01/30/2015   Procedure: RIGHT ARTHROSCOPY SHOULDER SUBACROMIAL DECOMPRESSION DISTAL CLAVICAL RESECTION RETATOR CUFF REPAIR;  Surgeon: Justice Britain, MD;  Location: [place];  Service: Orthopedics;  Laterality: Right;   TONSILLECTOMY     VASECTOMY      There were no vitals filed for this visit.    Subjective Assessment - 08/04/21 1105     Subjective Back pain and post thigh pain worsening over time with standing and walking.  Had L3-4 lumbar laminectomy and foraminectomy on 7/18.   Stayed 1 night and then went home.  I feel better.  I haven't been to the park in over a month.  Was using a RW up to a few days ago began using a cane.    Patient is accompained by: Family member   wife in waiting room   Pertinent History L3-4 lami 7/18    Limitations Walking;Standing    How long can you sit comfortably? no problem    How long can you  stand comfortably? 10 min    How long can you walk comfortably? 300 feet with the cane    Patient Stated Goals get endurance up, stand longer, walk further    Currently in Pain? No/denies    Pain Score 0-No pain    Pain Location Back    Pain Orientation Right;Left;Lower    Pain Type Surgical pain    Pain Onset More than a month ago    Pain Frequency Intermittent    Aggravating Factors  standing, walking    Pain Relieving Factors walking                [facility] PT Assessment - 08/04/21 0001       Assessment   Medical Diagnosis bil L3-4 lami for stenosis    Referring Provider (PT) Dr. Ellene Route    Onset Date/Surgical Date 07/06/21    Next MD Visit about 3-4 weeks    Prior Therapy for balance      Precautions   Precautions  None    Precaution Comments initially 8# but not restricted now      Restrictions   Weight Bearing Restrictions No      Balance Screen   Has the patient fallen in the past 6 months No   did slide out of bed when first home from hospital   Has the patient had a decrease in activity level because of a fear of falling?  No    Is the patient reluctant to leave their home because of a fear of falling?  No      Home Environment   Living Environment Private residence    Living Arrangements Spouse/significant other    Type of [place] to enter   5 steps with railing and wife's assist   Entrance Stairs-Number of Steps [place] to live on main level with bedroom/bathroom;Two level    Alternate Level Stairs-Number of Steps 15    Additional Comments Airdyne bike on 2nd floor      Prior Function   Level of Independence Independent with household mobility with device    Vocation Retired    Leisure go to the park; theatre      Observation/Other Assessments   Focus on Therapeutic Outcomes (FOTO)  52%      Posture/Postural Control   Posture Comments flexed trunk posture with both RW and cane      AROM   Overall AROM Comments spinal ROM not formally tested secondary to recent surgery but KTC WFLS on left, 25% limited on right      Strength   Overall Strength Comments min UE assist with sit to stand; long term weakness of right calf    Right Hip Flexion 4/5    Right Hip Extension 4/5    Right Hip ABduction 4/5    Left Hip Flexion 4+/5    Left Hip Extension 4+/5    Left Hip ABduction 4+/5    Lumbar Flexion 3+/5    Lumbar Extension 3+/5      Flexibility   Soft Tissue Assessment /Muscle Length --   dec bil HS and bil hip flexors     Special Tests   Other special tests no neural signs with supine gliding      Ambulation/Gait   Ambulation/Gait Yes    Ambulation/Gait Assistance 6: Modified independent (Device/Increase time)    Ambulation Distance  (Feet) 120 Feet    Assistive device Straight cane  also brought in RW   Stairs Yes    Stair Management Technique Alternating pattern;Two rails    Number of Stairs --   2x     Timed Up and Go Test   Normal TUG (seconds) 12.72    TUG Comments with cane                        Objective measurements completed on examination: See above findings.               PT Education - 08/04/21 1958     Education Details [place]; supine nerve glide; seated abdominal brace; heel raises at the counter and counter push ups    Person(s) Educated Patient    Methods Explanation;Demonstration;Handout    Comprehension Returned demonstration;Verbalized understanding              PT Short Term Goals - 08/04/21 2013       PT SHORT TERM GOAL #1   Title Pt will be independent with initial HEP to help with lumbar post surgical healing    Time 4    Period Weeks    Status New    Target Date 09/01/21      PT SHORT TERM GOAL #2   Title The patient will have LE strength needed to rise sit to standing 3x without UE use    Time 4    Period Weeks    Status New      PT SHORT TERM GOAL #3   Title The patient will be able to ambulate 500 feet and/or 6 minutes with cane needed for community ambulation    Time 4    Period Weeks    Status New      PT SHORT TERM GOAL #4   Title Pt will safely negotiate flight of steps at home with cane and 1 railing to access his Airdyne bike    Time 4    Period Weeks    Status New               PT Long Term Goals - 08/04/21 2016       PT LONG TERM GOAL #1   Title Pt will demonstrate independence with final HEP    Time 8    Period Weeks    Status New    Target Date 09/29/21      PT LONG TERM GOAL #2   Title FOTO score improved from 52% to 66%    Time 8    Period Weeks    Status New      PT LONG TERM GOAL #3   Title The patient will have improved trunk strength to 4/5 needed for improved tolerance to standing/walking for > 15  minutes needed for community mobility    Time 8    Period Weeks    Status New      PT LONG TERM GOAL #4   Title The patient will be able to walk 750 feet with cane needed to return to the park    Time 8    Period Weeks    Status New      PT LONG TERM GOAL #5   Title Timed up and Go improved < 12 sec indicating improved gait speed and LE strength    Time 8    Period Weeks    Status New  Plan - 08/04/21 1957     Clinical Impression Statement The patient is known to this facility for past treatment of balance issues.  He reports progressive difficulty with standing and walking longer periods of time secondary to lumbar stenosis.  He reports lumbar and bil posterior thigh discomfort that was relieved with sitting.  He underwent L3-4 laminectomy and foraminotomy on 7/18.  He presents at this time with his RW and a single point cane.  He states he just transitioned to the cane 3 days ago.  He has not attempted to go upstairs yet to ride his Airdyne bike but would like to do this soon.   He has been walking household distances mostly.  His trunk is flexed forward 20 degrees with ambulation with both the walker and cane.    Min UE assist with sit to stand.  Limited right hip flexion noted as well as decreased HS and hip flexor lengths bilaterally.  No neural signs.  He does have long term right calf weakness.  Decreased strength of abdominals and trunk extensors grossly 3/5.  Hip and knee strength at least 4/5.  He would benefit from PT for lumbar postop progressive strengthening and mobility ex's to facilitate return to community ambulation.    Personal Factors and Comorbidities Comorbidity 1    Comorbidities right shoulder surgery; right knee surgery; remote cardiac history    Examination-Activity Limitations Locomotion Level;Carry;Lift;Stand;Stairs    Examination-Participation Restrictions Community Activity;Driving;Shop;Yard Work    Stability/Clinical Decision Making  Stable/Uncomplicated    Clinical Decision Making Low    Rehab Potential Good    PT Frequency 2x / week    PT Duration 8 weeks    PT Treatment/Interventions ADLs/Self Care Home Management;Aquatic Therapy;Cryotherapy;Electrical Stimulation;Moist Heat;Functional mobility training;Therapeutic activities;Therapeutic exercise;Neuromuscular re-education;Manual techniques;Patient/family education;Taping    PT Next Visit Plan review initial HEP;  Nu-Step;  work on standing/walking more erect;  lumbo/pelvic/hip strengthening appropriate level for 1 month post op lami;  step ups for LE strengthening   PT Home Exercise Plan [id number]             Patient will benefit from skilled therapeutic intervention in order to improve the following deficits and impairments:  Decreased range of motion, Difficulty walking, Decreased activity tolerance, Pain, Decreased strength  Visit Diagnosis: Muscle weakness (generalized) - Plan: PT plan of care cert/re-cert  Bilateral low back pain, unspecified chronicity, unspecified whether sciatica present - Plan: PT plan of care cert/re-cert  Difficulty in walking, not elsewhere classified - Plan: PT plan of care cert/re-cert     Problem List Patient Active Problem List   Diagnosis Date Noted   Spinal stenosis of lumbar region with neurogenic claudication 07/07/2021   Spinal stenosis, lumbar region, with neurogenic claudication 07/06/2021   Dysfunction of Eustachian tube, bilateral 07/21/2020   Gait disorder 04/20/2016   Hyperglycemia 04/20/2016   Abnormal CT scan, chest 06/03/2014   Erectile dysfunction 06/03/2014   Spinal stenosis of lumbar region 03/15/2014   Cough 03/12/2013   Allergic rhinitis 03/12/2013   Leg pain, bilateral 12/30/2011   Bladder neck obstruction 12/30/2011   Preventative health care 12/25/2011   Lumbar disc disease 12/25/2011   S/P removal of thyroid nodule 12/25/2011   History of MI (myocardial infarction) 12/25/2011   Right knee  meniscal tear 12/25/2011   Degenerative arthritis of hip 12/25/2011   HIP PAIN, RIGHT 05/14/2010   FATIGUE 10/13/2009   HYPERTENSION, BENIGN 02/25/2009   CAD, AUTOLOGOUS BYPASS GRAFT 02/25/2009   CORONARY ARTERY ANEURYSM 02/25/2009  BACK PAIN 04/15/2008   Hyperlipidemia 02/13/2008   CORONARY ARTERY DISEASE 02/13/2008   DIVERTICULOSIS, COLON 02/13/2008   CARDIAC DISEASE, HX OF 02/13/2008   COLONIC POLYPS, HX OF 02/13/2008   Ruben Im, PT 08/04/21 8:25 PM Phone: [phone] Fax: [phone]  Alvera Singh 08/04/2021, 8:23 PM  [facility] [address] [place], [place], [address] Phone: [phone]   Fax:  [phone]  Name: BO STORMENT MRN: [id number] Date of Birth: 07-07-1932

## 2021-08-07 ENCOUNTER — Encounter: Payer: Self-pay | Admitting: Physical Therapy

## 2021-08-07 ENCOUNTER — Other Ambulatory Visit: Payer: Self-pay

## 2021-08-07 ENCOUNTER — Ambulatory Visit: Payer: Medicare Other | Admitting: Physical Therapy

## 2021-08-07 DIAGNOSIS — R262 Difficulty in walking, not elsewhere classified: Secondary | ICD-10-CM

## 2021-08-07 DIAGNOSIS — M545 Low back pain, unspecified: Secondary | ICD-10-CM

## 2021-08-07 DIAGNOSIS — M6281 Muscle weakness (generalized): Secondary | ICD-10-CM | POA: Diagnosis not present

## 2021-08-07 NOTE — Therapy (Signed)
[facility] [address], [place], [place], [address] Phone: [phone]   Fax:  [phone]  Physical Therapy Treatment  Patient Details  Name: Larry [name] MRN: [id number] Date of Birth: 1932-12-20 Referring Provider (PT): Dr. Ellene Route   Encounter Date: 08/07/2021   PT End of Session - 08/07/21 1020     Visit Number 2    Date for PT Re-Evaluation 09/29/21    Authorization Type Medicare    PT Start Time [id number]    PT Stop Time 1053    PT Time Calculation (min) 38 min    Activity Tolerance Patient tolerated treatment well    Behavior During Therapy [facility] for tasks assessed/performed             Past Medical History:  Diagnosis Date   Allergic rhinitis, cause unspecified 03/12/2013   Allergy    CAD (coronary artery disease)    S/P CABG in 1999    Cataract    Decreased exercise tolerance    Exertional fatigue   Degenerative arthritis of hip    Diverticulosis of colon    ED (erectile dysfunction)    Erectile dysfunction 06/03/2014   History of colonic polyps    History of kidney stones    History of MI (myocardial infarction)    Hx of adenomatous colonic polyps 2000   Hyperlipidemia    Low HDL   Lumbar disc disease    Hx of with recent back pain and buttock pain   Myocardial infarction ([place]) 1999   Right knee meniscal tear    S/P removal of thyroid nodule    Spinal stenosis of lumbar region 03/15/2014    Past Surgical History:  Procedure Laterality Date   [place]   CYSTOSCOPY/URETEROSCOPY/HOLMIUM LASER/STENT PLACEMENT Left 12/23/2017   Procedure: CYSTOSCOPY/RETROGRADE/URETEROSCOPY/HOLMIUM LASER/STENT PLACEMENT;  Surgeon: Festus Aloe, MD;  Location[facility];  Service: Urology;  Laterality: Left;  ONLY NEEDS 60 MIN   EYE SURGERY Bilateral    bilaterl cataract removal   LUMBAR  DISC SURGERY     LUMBAR LAMINECTOMY/DECOMPRESSION MICRODISCECTOMY Bilateral 07/06/2021   Procedure: Bilateral Lumbar three-four Laminectomy and foraminotomy;  Surgeon: Kristeen Miss, MD;  Location: [place];  Service: Neurosurgery;  Laterality: Bilateral;   Nodules on Thyroid     POLYPECTOMY     PTCA     SHOULDER ARTHROSCOPY WITH ROTATOR CUFF REPAIR AND SUBACROMIAL DECOMPRESSION Right 01/30/2015   Procedure: RIGHT ARTHROSCOPY SHOULDER SUBACROMIAL DECOMPRESSION DISTAL CLAVICAL RESECTION RETATOR CUFF REPAIR;  Surgeon: Justice Britain, MD;  Location: [place];  Service: Orthopedics;  Laterality: Right;   TONSILLECTOMY     VASECTOMY      There were no vitals filed for this visit.   Subjective Assessment - 08/07/21 1019     Subjective Currently denies any pain. Doing HEP and walking around the house.    Pertinent History L3-4 lami 7/18    Currently in Pain? No/denies    Multiple Pain Sites No                               [facility] Adult PT Treatment/Exercise - 08/07/21 0001       Knee/Hip Exercises: Aerobic   Nustep L1 x 10 min with PTA present to monitor and get update on status  Knee/Hip Exercises: Standing   Heel Raises Both;1 set;10 reps    Heel Raises Limitations Requires UE support    Hip Flexion AROM;Stengthening;Both;1 set;10 reps;Knee bent    Hip Abduction AROM;Stengthening;Both;1 set;10 reps;Knee straight    Forward Step Up Both;1 set;10 reps;Hand Hold: 2;Step Height: 6"    Forward Step Up Limitations pretty SOB post, requires a seated break    Other Standing Knee Exercises Counter top pushup review: 10x                      PT Short Term Goals - 08/07/21 1041       PT SHORT TERM GOAL #1   Title Pt will be independent with initial HEP to help with lumbar post surgical healing    Time 4    Period Weeks    Status Achieved    Target Date 09/01/21      PT SHORT TERM GOAL #4   Title Pt will safely negotiate flight of steps at home with cane  and 1 railing to access his Airdyne bike    Time 4    Period Weeks    Status Achieved    Target Date 09/10/20               PT Long Term Goals - 08/04/21 2016       PT LONG TERM GOAL #1   Title Pt will demonstrate independence with final HEP    Time 8    Period Weeks    Status New    Target Date 09/29/21      PT LONG TERM GOAL #2   Title FOTO score improved from 52% to 66%    Time 8    Period Weeks    Status New      PT LONG TERM GOAL #3   Title The patient will have improved trunk strength to 4/5 needed for improved tolerance to standing/walking for > 15 minutes needed for community mobility    Time 8    Period Weeks    Status New      PT LONG TERM GOAL #4   Title The patient will be able to walk 750 feet with cane needed to return to the park    Time 8    Period Weeks    Status New      PT LONG TERM GOAL #5   Title Timed up and Go improved < 12 sec indicating improved gait speed and LE strength    Time 8    Period Weeks    Status New                   Plan - 08/07/21 1021     Clinical Impression Statement Pt arrives today pain free and reports compliance with initial HEP.  Pt demonstrates improving upright posture. Pt's HEp was added to today, VC to "lift" his thigh bones slowly helped isolate muscles and reduce momentum. Pt did fatigue at end of session.    Personal Factors and Comorbidities Comorbidity 1    Comorbidities right shoulder surgery; right knee surgery; remote cardiac history    Examination-Participation Restrictions Community Activity;Driving;Shop;Yard Work    Stability/Clinical Decision Making Stable/Uncomplicated    Rehab Potential Good    PT Frequency 2x / week    PT Duration 8 weeks    PT Treatment/Interventions ADLs/Self Care Home Management;Aquatic Therapy;Cryotherapy;Electrical Stimulation;Moist Heat;Functional mobility training;Therapeutic activities;Therapeutic exercise;Neuromuscular re-education;Manual  techniques;Patient/family education;Taping    PT Next Visit  Plan nustep, review new HEP    PT Home Exercise Plan [id number]    Consulted and Agree with Plan of Care Patient             Patient will benefit from skilled therapeutic intervention in order to improve the following deficits and impairments:  Decreased range of motion, Difficulty walking, Decreased activity tolerance, Pain, Decreased strength  Visit Diagnosis: Muscle weakness (generalized)  Bilateral low back pain, unspecified chronicity, unspecified whether sciatica present  Difficulty in walking, not elsewhere classified     Problem List Patient Active Problem List   Diagnosis Date Noted   Spinal stenosis of lumbar region with neurogenic claudication 07/07/2021   Spinal stenosis, lumbar region, with neurogenic claudication 07/06/2021   Dysfunction of Eustachian tube, bilateral 07/21/2020   Gait disorder 04/20/2016   Hyperglycemia 04/20/2016   Abnormal CT scan, chest 06/03/2014   Erectile dysfunction 06/03/2014   Spinal stenosis of lumbar region 03/15/2014   Cough 03/12/2013   Allergic rhinitis 03/12/2013   Leg pain, bilateral 12/30/2011   Bladder neck obstruction 12/30/2011   Preventative health care 12/25/2011   Lumbar disc disease 12/25/2011   S/P removal of thyroid nodule 12/25/2011   History of MI (myocardial infarction) 12/25/2011   Right knee meniscal tear 12/25/2011   Degenerative arthritis of hip 12/25/2011   HIP PAIN, RIGHT 05/14/2010   FATIGUE 10/13/2009   HYPERTENSION, BENIGN 02/25/2009   CAD, AUTOLOGOUS BYPASS GRAFT 02/25/2009   CORONARY ARTERY ANEURYSM 02/25/2009   BACK PAIN 04/15/2008   Hyperlipidemia 02/13/2008   CORONARY ARTERY DISEASE 02/13/2008   DIVERTICULOSIS, COLON 02/13/2008   CARDIAC DISEASE, HX OF 02/13/2008   COLONIC POLYPS, HX OF 02/13/2008    Adrine Hayworth, PTA 08/07/2021, 10:54 AM  [facility] [address]  [place], [place], [address] Phone: [phone]   Fax:  [phone]  Name: Larry [name] MRN: [id number] Date of Birth: 1932/01/18  Access Code: [email or web address]: 08/19/2022Prepared by: Anderson Malta CochranExercises  Hooklying Single Knee to Chest Stretch - 1 x daily - 7 x weekly - 1 sets - 3 reps - 10 hold  Supine Sciatic Nerve Glide - 1 x daily - 7 x weekly - 1 sets - 10 reps  Seated Transversus Abdominis Bracing with PLB - 1 x daily - 7 x weekly - 1 sets - 10 reps  Standing Heel Raise with Support - 1 x daily - 7 x weekly - 1 sets - 10 reps  Push ups on kitchen counter - 1 x daily - 7 x weekly - 1 sets - 10 reps  Standing March with Counter Support - 2 x daily - 7 x weekly - 2 sets - 10 reps  Standing Hip Abduction with Counter Support - 2 x daily - 7 x weekly - 2 sets - 10 reps  Forward Step Up with Counter Support - 1 x daily - 7 x weekly - 1 sets - 10 reps

## 2021-08-14 ENCOUNTER — Ambulatory Visit: Payer: Medicare Other | Admitting: Physical Therapy

## 2021-08-14 ENCOUNTER — Other Ambulatory Visit: Payer: Self-pay

## 2021-08-14 DIAGNOSIS — M6281 Muscle weakness (generalized): Secondary | ICD-10-CM

## 2021-08-14 DIAGNOSIS — R262 Difficulty in walking, not elsewhere classified: Secondary | ICD-10-CM

## 2021-08-14 DIAGNOSIS — M545 Low back pain, unspecified: Secondary | ICD-10-CM

## 2021-08-14 NOTE — Therapy (Signed)
[facility] [address], [place], [place], [address] Phone: [phone]   Fax:  [phone]  Physical Therapy Treatment  Patient Details  Name: Larry [name] MRN: [id number] Date of Birth: May 20, 1932 Referring Provider (PT): Dr. Ellene Route   Encounter Date: 08/14/2021   PT End of Session - 08/14/21 1109     Visit Number 3    Date for PT Re-Evaluation 09/29/21    Authorization Type Medicare    PT Start Time 1100    PT Stop Time 1145    PT Time Calculation (min) 45 min    Activity Tolerance Patient tolerated treatment well             Past Medical History:  Diagnosis Date   Allergic rhinitis, cause unspecified 03/12/2013   Allergy    CAD (coronary artery disease)    S/P CABG in 1999    Cataract    Decreased exercise tolerance    Exertional fatigue   Degenerative arthritis of hip    Diverticulosis of colon    ED (erectile dysfunction)    Erectile dysfunction 06/03/2014   History of colonic polyps    History of kidney stones    History of MI (myocardial infarction)    Hx of adenomatous colonic polyps 2000   Hyperlipidemia    Low HDL   Lumbar disc disease    Hx of with recent back pain and buttock pain   Myocardial infarction ([place]) 1999   Right knee meniscal tear    S/P removal of thyroid nodule    Spinal stenosis of lumbar region 03/15/2014    Past Surgical History:  Procedure Laterality Date   [place]   CYSTOSCOPY/URETEROSCOPY/HOLMIUM LASER/STENT PLACEMENT Left 12/23/2017   Procedure: CYSTOSCOPY/RETROGRADE/URETEROSCOPY/HOLMIUM LASER/STENT PLACEMENT;  Surgeon: Festus Aloe, MD;  Location: [facility];  Service: Urology;  Laterality: Left;  ONLY NEEDS 60 MIN   EYE SURGERY Bilateral    bilaterl cataract removal   LUMBAR DISC SURGERY     LUMBAR LAMINECTOMY/DECOMPRESSION  MICRODISCECTOMY Bilateral 07/06/2021   Procedure: Bilateral Lumbar three-four Laminectomy and foraminotomy;  Surgeon: Kristeen Miss, MD;  Location: [place];  Service: Neurosurgery;  Laterality: Bilateral;   Nodules on Thyroid     POLYPECTOMY     PTCA     SHOULDER ARTHROSCOPY WITH ROTATOR CUFF REPAIR AND SUBACROMIAL DECOMPRESSION Right 01/30/2015   Procedure: RIGHT ARTHROSCOPY SHOULDER SUBACROMIAL DECOMPRESSION DISTAL CLAVICAL RESECTION RETATOR CUFF REPAIR;  Surgeon: Justice Britain, MD;  Location: [place];  Service: Orthopedics;  Laterality: Right;   TONSILLECTOMY     VASECTOMY      There were no vitals filed for this visit.   Subjective Assessment - 08/14/21 1102     Subjective I'm getting better and better.  I'm doing the exercises.  I can walk about 10 minutes with my cane, sit and rest then 10 minutes more.    Patient Stated Goals get endurance up, stand longer, walk further    Currently in Pain? No/denies    Pain Score 0-No pain    Pain Location Back    Pain Type Surgical pain                               [facility] Adult PT Treatment/Exercise - 08/14/21 0001  Therapeutic Activites    Therapeutic Activities ADL's    ADL's hip hinge instruction with golf club with sit to stand and 30 degree forward bend      Lumbar Exercises: Stretches   Other Lumbar Stretch Exercise 2nd step hip flexor stretch      Lumbar Exercises: Standing   Row Strengthening;Both;15 reps;Theraband    Shoulder Extension Strengthening;Both;20 reps    Theraband Level (Shoulder Extension) Level 2 (Red)    Other Standing Lumbar Exercises wall climbs UE/LE 10x    Other Standing Lumbar Exercises wall push ups 10x; +5 more at higher intensity      Lumbar Exercises: Seated   Other Seated Lumbar Exercises seated 5# plyo ball chops 10x each way    Other Seated Lumbar Exercises mini chair sit ups 5# 10x      Knee/Hip Exercises: Aerobic   Nustep seat 9 L1 10 min while discussing status/progress       Knee/Hip Exercises: Standing   Forward Step Up Both;1 set;10 reps;Hand Hold: 2;Step Height: 6"    Other Standing Knee Exercises therapist holding blue band with hip press forward 2x10                      PT Short Term Goals - 08/07/21 1041       PT SHORT TERM GOAL #1   Title Pt will be independent with initial HEP to help with lumbar post surgical healing    Time 4    Period Weeks    Status Achieved    Target Date 09/01/21      PT SHORT TERM GOAL #4   Title Pt will safely negotiate flight of steps at home with cane and 1 railing to access his [employer] bike    Time 4    Period Weeks    Status Achieved    Target Date 09/10/20               PT Long Term Goals - 08/04/21 2016       PT LONG TERM GOAL #1   Title Pt will demonstrate independence with final HEP    Time 8    Period Weeks    Status New    Target Date 09/29/21      PT LONG TERM GOAL #2   Title FOTO score improved from 52% to 66%    Time 8    Period Weeks    Status New      PT LONG TERM GOAL #3   Title The patient will have improved trunk strength to 4/5 needed for improved tolerance to standing/walking for > 15 minutes needed for community mobility    Time 8    Period Weeks    Status New      PT LONG TERM GOAL #4   Title The patient will be able to walk 750 feet with cane needed to return to the park    Time 8    Period Weeks    Status New      PT LONG TERM GOAL #5   Title Timed up and Go improved < 12 sec indicating improved gait speed and LE strength    Time 8    Period Weeks    Status New                   Plan - 08/14/21 1222     Clinical Impression Statement The patient has progressed well with mobility and gait since  start of care.  He is able to rise from a standard chair without UE assist with relative ease and walks around the clinic today without his SPC.  Instructed in hip hinge technique and he is able to perform sit to stand with good form.  He has  difficulty keeping the form with forward bending and would benefit from additional practice on this.  He needs a few short rest breaks secondary to shortness of breath (may be associated with mask wearing) but recovers very quickly.  Therapist montioring response throughout session and modifying treatment accordingly.    Comorbidities right shoulder surgery; right knee surgery; remote cardiac history    Examination-Activity Limitations Locomotion Level;Carry;Lift;Stand;Stairs    Rehab Potential Good    PT Frequency 2x / week    PT Duration 8 weeks    PT Treatment/Interventions ADLs/Self Care Home Management;Aquatic Therapy;Cryotherapy;Electrical Stimulation;Moist Heat;Functional mobility training;Therapeutic activities;Therapeutic exercise;Neuromuscular re-education;Manual techniques;Patient/family education;Taping    PT Next Visit Plan trunk and LE strengthening; add to HEP; Nu-Step; review hip hinge using cane behind back    PT [place]             Patient will benefit from skilled therapeutic intervention in order to improve the following deficits and impairments:  Decreased range of motion, Difficulty walking, Decreased activity tolerance, Pain, Decreased strength  Visit Diagnosis: Muscle weakness (generalized)  Bilateral low back pain, unspecified chronicity, unspecified whether sciatica present  Difficulty in walking, not elsewhere classified     Problem List Patient Active Problem List   Diagnosis Date Noted   Spinal stenosis of lumbar region with neurogenic claudication 07/07/2021   Spinal stenosis, lumbar region, with neurogenic claudication 07/06/2021   Dysfunction of Eustachian tube, bilateral 07/21/2020   Gait disorder 04/20/2016   Hyperglycemia 04/20/2016   Abnormal CT scan, chest 06/03/2014   Erectile dysfunction 06/03/2014   Spinal stenosis of lumbar region 03/15/2014   Cough 03/12/2013   Allergic rhinitis 03/12/2013   Leg pain, bilateral  12/30/2011   Bladder neck obstruction 12/30/2011   Preventative health care 12/25/2011   Lumbar disc disease 12/25/2011   S/P removal of thyroid nodule 12/25/2011   History of MI (myocardial infarction) 12/25/2011   Right knee meniscal tear 12/25/2011   Degenerative arthritis of hip 12/25/2011   HIP PAIN, RIGHT 05/14/2010   FATIGUE 10/13/2009   HYPERTENSION, BENIGN 02/25/2009   CAD, AUTOLOGOUS BYPASS GRAFT 02/25/2009   CORONARY ARTERY ANEURYSM 02/25/2009   BACK PAIN 04/15/2008   Hyperlipidemia 02/13/2008   CORONARY ARTERY DISEASE 02/13/2008   DIVERTICULOSIS, COLON 02/13/2008   CARDIAC DISEASE, HX OF 02/13/2008   COLONIC POLYPS, HX OF 02/13/2008   Ruben Im, PT 08/14/21 12:29 PM Phone: [phone] Fax: [phone]  Alvera Singh 08/14/2021, 12:29 PM  [facility] [address] [place], [place], [address] Phone: [phone]   Fax:  [phone]  Name: CORINTHIAN FAYNE MRN: [id number] Date of Birth: 1932/02/01

## 2021-08-17 ENCOUNTER — Other Ambulatory Visit: Payer: Self-pay

## 2021-08-17 ENCOUNTER — Ambulatory Visit: Payer: Medicare Other

## 2021-08-17 DIAGNOSIS — M545 Low back pain, unspecified: Secondary | ICD-10-CM

## 2021-08-17 DIAGNOSIS — M6281 Muscle weakness (generalized): Secondary | ICD-10-CM

## 2021-08-17 DIAGNOSIS — R262 Difficulty in walking, not elsewhere classified: Secondary | ICD-10-CM

## 2021-08-17 NOTE — Therapy (Signed)
[facility] [address], [place], [place], [address] Phone: [phone]   Fax:  [phone]  Physical Therapy Treatment  Patient Details  Name: Larry [name] MRN: [id number] Date of Birth: 10/14/1932 Referring Provider (PT): Dr. Ellene Route   Encounter Date: 08/17/2021   PT End of Session - 08/17/21 1654     Visit Number 4    Date for PT Re-Evaluation 09/29/21    Authorization Type Medicare    PT Start Time [id number]    PT Stop Time 1656    PT Time Calculation (min) 39 min    Activity Tolerance Patient tolerated treatment well    Behavior During Therapy [facility] for tasks assessed/performed             Past Medical History:  Diagnosis Date   Allergic rhinitis, cause unspecified 03/12/2013   Allergy    CAD (coronary artery disease)    S/P CABG in 1999    Cataract    Decreased exercise tolerance    Exertional fatigue   Degenerative arthritis of hip    Diverticulosis of colon    ED (erectile dysfunction)    Erectile dysfunction 06/03/2014   History of colonic polyps    History of kidney stones    History of MI (myocardial infarction)    Hx of adenomatous colonic polyps 2000   Hyperlipidemia    Low HDL   Lumbar disc disease    Hx of with recent back pain and buttock pain   Myocardial infarction ([place]) 1999   Right knee meniscal tear    S/P removal of thyroid nodule    Spinal stenosis of lumbar region 03/15/2014    Past Surgical History:  Procedure Laterality Date   [place]   CYSTOSCOPY/URETEROSCOPY/HOLMIUM LASER/STENT PLACEMENT Left 12/23/2017   Procedure: CYSTOSCOPY/RETROGRADE/URETEROSCOPY/HOLMIUM LASER/STENT PLACEMENT;  Surgeon: Festus Aloe, MD;  [facility];  Service: Urology;  Laterality: Left;  ONLY NEEDS 60 MIN   EYE SURGERY Bilateral    bilaterl cataract removal   LUMBAR  DISC SURGERY     LUMBAR LAMINECTOMY/DECOMPRESSION MICRODISCECTOMY Bilateral 07/06/2021   Procedure: Bilateral Lumbar three-four Laminectomy and foraminotomy;  Surgeon: Kristeen Miss, MD;  Location: [place];  Service: Neurosurgery;  Laterality: Bilateral;   Nodules on Thyroid     POLYPECTOMY     PTCA     SHOULDER ARTHROSCOPY WITH ROTATOR CUFF REPAIR AND SUBACROMIAL DECOMPRESSION Right 01/30/2015   Procedure: RIGHT ARTHROSCOPY SHOULDER SUBACROMIAL DECOMPRESSION DISTAL CLAVICAL RESECTION RETATOR CUFF REPAIR;  Surgeon: Justice Britain, MD;  Location: [place];  Service: Orthopedics;  Laterality: Right;   TONSILLECTOMY     VASECTOMY      There were no vitals filed for this visit.   Subjective Assessment - 08/17/21 1614     Subjective I am using my cane.    Currently in Pain? No/denies                               [facility] Adult PT Treatment/Exercise - 08/17/21 0001       Lumbar Exercises: Stretches   Other Lumbar Stretch Exercise 2nd step hip flexor stretch      Lumbar Exercises: Standing   Row Strengthening;Both;15 reps;Theraband    Theraband Level (Row) Level 2 (Red)    Row Limitations max  tactile cues for scapular depression    Shoulder Extension Strengthening;Both;20 reps    Theraband Level (Shoulder Extension) Level 2 (Red)    Other Standing Lumbar Exercises wall push ups 2x10      Lumbar Exercises: Seated   Other Seated Lumbar Exercises seated 5# plyo ball chops 10x each way    Other Seated Lumbar Exercises mini chair sit ups 5# 10x      Knee/Hip Exercises: Standing   Heel Raises Both;10 reps;2 sets    Heel Raises Limitations Requires UE support    Hip Abduction AROM;Stengthening;Both;10 reps;Knee straight;2 sets    Abduction Limitations 5#    Forward Step Up Both;1 set;10 reps;Hand Hold: 2;Step Height: 6"                      PT Short Term Goals - 08/07/21 1041       PT SHORT TERM GOAL #1   Title Pt will be independent with initial HEP to  help with lumbar post surgical healing    Time 4    Period Weeks    Status Achieved    Target Date 09/01/21      PT SHORT TERM GOAL #4   Title Pt will safely negotiate flight of steps at home with cane and 1 railing to access his [employer] bike    Time 4    Period Weeks    Status Achieved    Target Date 09/10/20               PT Long Term Goals - 08/04/21 2016       PT LONG TERM GOAL #1   Title Pt will demonstrate independence with final HEP    Time 8    Period Weeks    Status New    Target Date 09/29/21      PT LONG TERM GOAL #2   Title FOTO score improved from 52% to 66%    Time 8    Period Weeks    Status New      PT LONG TERM GOAL #3   Title The patient will have improved trunk strength to 4/5 needed for improved tolerance to standing/walking for > 15 minutes needed for community mobility    Time 8    Period Weeks    Status New      PT LONG TERM GOAL #4   Title The patient will be able to walk 750 feet with cane needed to return to the park    Time 8    Period Weeks    Status New      PT LONG TERM GOAL #5   Title Timed up and Go improved < 12 sec indicating improved gait speed and LE strength    Time 8    Period Weeks    Status New                   Plan - 08/17/21 1642     Clinical Impression Statement The patient has progressed well with mobility, and he is able to rise from a standard chair without UE assist and walks with cane for all distances.  Pt with forward trunk flexion and shortened step length with gait. He needs a few short rest breaks secondary to shortness of breath but recovers very quickly.  Therapist monitoring response throughout session and modifying as needed.    PT Frequency 2x / week    PT Duration 8 weeks  PT Treatment/Interventions ADLs/Self Care Home Management;Aquatic Therapy;Cryotherapy;Electrical Stimulation;Moist Heat;Functional mobility training;Therapeutic activities;Therapeutic exercise;Neuromuscular  re-education;Manual techniques;Patient/family education;Taping    PT Next Visit Plan trunk and LE strengthening; add to HEP if needed,  Nu-Step; review hip hinge using cane behind back    PT Home Exercise Plan [id number]    Recommended Other Services initial cert is signed    Consulted and Agree with Plan of Care Patient             Patient will benefit from skilled therapeutic intervention in order to improve the following deficits and impairments:     Visit Diagnosis: Muscle weakness (generalized)  Bilateral low back pain, unspecified chronicity, unspecified whether sciatica present  Difficulty in walking, not elsewhere classified     Problem List Patient Active Problem List   Diagnosis Date Noted   Spinal stenosis of lumbar region with neurogenic claudication 07/07/2021   Spinal stenosis, lumbar region, with neurogenic claudication 07/06/2021   Dysfunction of Eustachian tube, bilateral 07/21/2020   Gait disorder 04/20/2016   Hyperglycemia 04/20/2016   Abnormal CT scan, chest 06/03/2014   Erectile dysfunction 06/03/2014   Spinal stenosis of lumbar region 03/15/2014   Cough 03/12/2013   Allergic rhinitis 03/12/2013   Leg pain, bilateral 12/30/2011   Bladder neck obstruction 12/30/2011   Preventative health care 12/25/2011   Lumbar disc disease 12/25/2011   S/P removal of thyroid nodule 12/25/2011   History of MI (myocardial infarction) 12/25/2011   Right knee meniscal tear 12/25/2011   Degenerative arthritis of hip 12/25/2011   HIP PAIN, RIGHT 05/14/2010   FATIGUE 10/13/2009   HYPERTENSION, BENIGN 02/25/2009   CAD, AUTOLOGOUS BYPASS GRAFT 02/25/2009   CORONARY ARTERY ANEURYSM 02/25/2009   BACK PAIN 04/15/2008   Hyperlipidemia 02/13/2008   CORONARY ARTERY DISEASE 02/13/2008   DIVERTICULOSIS, COLON 02/13/2008   CARDIAC DISEASE, HX OF 02/13/2008   COLONIC POLYPS, HX OF 02/13/2008   Sigurd Sos, PT 08/17/21 4:59 PM   [facility] [address] [place], [place], [address] Phone: [phone]   Fax:  [phone]  Name: Larry [name] MRN: [id number] Date of Birth: 12-26-1931

## 2021-08-20 ENCOUNTER — Other Ambulatory Visit: Payer: Self-pay

## 2021-08-20 ENCOUNTER — Ambulatory Visit: Payer: Medicare Other | Attending: Neurological Surgery

## 2021-08-20 DIAGNOSIS — R296 Repeated falls: Secondary | ICD-10-CM | POA: Insufficient documentation

## 2021-08-20 DIAGNOSIS — R262 Difficulty in walking, not elsewhere classified: Secondary | ICD-10-CM | POA: Insufficient documentation

## 2021-08-20 DIAGNOSIS — M6281 Muscle weakness (generalized): Secondary | ICD-10-CM | POA: Diagnosis not present

## 2021-08-20 DIAGNOSIS — M545 Low back pain, unspecified: Secondary | ICD-10-CM | POA: Insufficient documentation

## 2021-08-20 NOTE — Therapy (Signed)
[facility] [address], [place], [place], [address] Phone: [phone]   Fax:  [phone]  Physical Therapy Treatment  Patient Details  Name: Larry [name] MRN: [id number] Date of Birth: [birth year] Referring Provider (PT): Dr. Ellene Route   Encounter Date: 08/20/2021   PT End of Session - 08/20/21 1227     Visit Number 5    Date for PT Re-Evaluation 09/29/21    Authorization Type Medicare    PT Start Time 1147    PT Stop Time 1227    PT Time Calculation (min) 40 min    Activity Tolerance Patient tolerated treatment well    Behavior During Therapy [facility] for tasks assessed/performed             Past Medical History:  Diagnosis Date   Allergic rhinitis, cause unspecified 03/12/2013   Allergy    CAD (coronary artery disease)    S/P CABG in 1999    Cataract    Decreased exercise tolerance    Exertional fatigue   Degenerative arthritis of hip    Diverticulosis of colon    ED (erectile dysfunction)    Erectile dysfunction 06/03/2014   History of colonic polyps    History of kidney stones    History of MI (myocardial infarction)    Hx of adenomatous colonic polyps 2000   Hyperlipidemia    Low HDL   Lumbar disc disease    Hx of with recent back pain and buttock pain   Myocardial infarction ([place]) 1999   Right knee meniscal tear    S/P removal of thyroid nodule    Spinal stenosis of lumbar region 03/15/2014    Past Surgical History:  Procedure Laterality Date   [place]   CYSTOSCOPY/URETEROSCOPY/HOLMIUM LASER/STENT PLACEMENT Left 12/23/2017   Procedure: CYSTOSCOPY/RETROGRADE/URETEROSCOPY/HOLMIUM LASER/STENT PLACEMENT;  Surgeon: Festus Aloe, MD;  [facility];  Service: Urology;  Laterality: Left;  ONLY NEEDS 60 MIN   EYE SURGERY Bilateral    bilaterl cataract removal   LUMBAR  DISC SURGERY     LUMBAR LAMINECTOMY/DECOMPRESSION MICRODISCECTOMY Bilateral 07/06/2021   Procedure: Bilateral Lumbar three-four Laminectomy and foraminotomy;  Surgeon: Kristeen Miss, MD;  Location: [place];  Service: Neurosurgery;  Laterality: Bilateral;   Nodules on Thyroid     POLYPECTOMY     PTCA     SHOULDER ARTHROSCOPY WITH ROTATOR CUFF REPAIR AND SUBACROMIAL DECOMPRESSION Right 01/30/2015   Procedure: RIGHT ARTHROSCOPY SHOULDER SUBACROMIAL DECOMPRESSION DISTAL CLAVICAL RESECTION RETATOR CUFF REPAIR;  Surgeon: Justice Britain, MD;  Location: [place];  Service: Orthopedics;  Laterality: Right;   TONSILLECTOMY     VASECTOMY      There were no vitals filed for this visit.   Subjective Assessment - 08/20/21 1151     Subjective I'm walking better each day.  I walked at the park yesterday.    Currently in Pain? No/denies                               [facility] Adult PT Treatment/Exercise - 08/20/21 0001       Lumbar Exercises: Stretches   Other Lumbar Stretch Exercise 2nd step hip flexor stretch      Lumbar Exercises: Standing   Shoulder Extension Strengthening;Both;20 reps    Theraband Level (Shoulder Extension)  Level 2 (Red)      Lumbar Exercises: Seated   Other Seated Lumbar Exercises seated 5# plyo ball chops 10x each way    Other Seated Lumbar Exercises mini chair sit ups 5# 10x      Knee/Hip Exercises: Aerobic   Nustep seat 9 L2 10 min while discussing status/progress      Knee/Hip Exercises: Standing   Heel Raises Both;10 reps;2 sets    Heel Raises Limitations Requires UE support    Hip Abduction AROM;Stengthening;Both;10 reps;Knee straight;2 sets    Abduction Limitations 5#    Forward Step Up Both;10 reps;Hand Hold: 2;Step Height: 6";2 sets                 Balance Exercises - 08/20/21 0001       Balance Exercises: Standing   Tandem Stance Upper extremity support 1;3 reps;10 secs    Other Standing Exercises box step on discs on floorx 5 laps  each direction- gait belt used                 PT Short Term Goals - 08/07/21 1041       PT SHORT TERM GOAL #1   Title Pt will be independent with initial HEP to help with lumbar post surgical healing    Time 4    Period Weeks    Status Achieved    Target Date 09/01/21      PT SHORT TERM GOAL #4   Title Pt will safely negotiate flight of steps at home with cane and 1 railing to access his [employer] bike    Time 4    Period Weeks    Status Achieved    Target Date 09/10/20               PT Long Term Goals - 08/04/21 2016       PT LONG TERM GOAL #1   Title Pt will demonstrate independence with final HEP    Time 8    Period Weeks    Status New    Target Date 09/29/21      PT LONG TERM GOAL #2   Title FOTO score improved from 52% to 66%    Time 8    Period Weeks    Status New      PT LONG TERM GOAL #3   Title The patient will have improved trunk strength to 4/5 needed for improved tolerance to standing/walking for > 15 minutes needed for community mobility    Time 8    Period Weeks    Status New      PT LONG TERM GOAL #4   Title The patient will be able to walk 750 feet with cane needed to return to the park    Time 8    Period Weeks    Status New      PT LONG TERM GOAL #5   Title Timed up and Go improved < 12 sec indicating improved gait speed and LE strength    Time 8    Period Weeks    Status New                   Plan - 08/20/21 1156     Clinical Impression Statement The patient has progressed well with mobility, and he is able to rise from a standard chair without UE assist and walks with cane for all distances.  Pt with forward trunk flexion and shortened step length with gait. He  needs a few short rest breaks secondary to shortness of breath but recovers very quickly. Pt did well with standing balance exercises today and required supervision and min assist for safety.  Therapist monitoring response throughout session and modifying as  needed.    PT Frequency 2x / week    PT Duration 8 weeks    PT Treatment/Interventions ADLs/Self Care Home Management;Aquatic Therapy;Cryotherapy;Electrical Stimulation;Moist Heat;Functional mobility training;Therapeutic activities;Therapeutic exercise;Neuromuscular re-education;Manual techniques;Patient/family education;Taping    PT Next Visit Plan trunk and LE strengthening; balance,   Nu-Step; review hip hinge using cane behind back    PT Home Exercise Plan [id number]             Patient will benefit from skilled therapeutic intervention in order to improve the following deficits and impairments:  Decreased range of motion, Difficulty walking, Decreased activity tolerance, Pain, Decreased strength  Visit Diagnosis: Muscle weakness (generalized)  Bilateral low back pain, unspecified chronicity, unspecified whether sciatica present  Difficulty in walking, not elsewhere classified     Problem List Patient Active Problem List   Diagnosis Date Noted   Spinal stenosis of lumbar region with neurogenic claudication 07/07/2021   Spinal stenosis, lumbar region, with neurogenic claudication 07/06/2021   Dysfunction of Eustachian tube, bilateral 07/21/2020   Gait disorder 04/20/2016   Hyperglycemia 04/20/2016   Abnormal CT scan, chest 06/03/2014   Erectile dysfunction 06/03/2014   Spinal stenosis of lumbar region 03/15/2014   Cough 03/12/2013   Allergic rhinitis 03/12/2013   Leg pain, bilateral 12/30/2011   Bladder neck obstruction 12/30/2011   Preventative health care 12/25/2011   Lumbar disc disease 12/25/2011   S/P removal of thyroid nodule 12/25/2011   History of MI (myocardial infarction) 12/25/2011   Right knee meniscal tear 12/25/2011   Degenerative arthritis of hip 12/25/2011   HIP PAIN, RIGHT 05/14/2010   FATIGUE 10/13/2009   HYPERTENSION, BENIGN 02/25/2009   CAD, AUTOLOGOUS BYPASS GRAFT 02/25/2009   CORONARY ARTERY ANEURYSM 02/25/2009   BACK PAIN 04/15/2008    Hyperlipidemia 02/13/2008   CORONARY ARTERY DISEASE 02/13/2008   DIVERTICULOSIS, COLON 02/13/2008   CARDIAC DISEASE, HX OF 02/13/2008   COLONIC POLYPS, HX OF 02/13/2008    Jannel Lynne 08/20/2021, 12:30 PM  [facility] [address] [place], [place], [address] Phone: [phone]   Fax:  [phone]  Name: Larry [name] MRN: [id number] Date of Birth: May 09, 1932

## 2021-08-25 ENCOUNTER — Other Ambulatory Visit: Payer: Self-pay

## 2021-08-25 ENCOUNTER — Ambulatory Visit: Payer: Medicare Other | Admitting: Physical Therapy

## 2021-08-25 DIAGNOSIS — M6281 Muscle weakness (generalized): Secondary | ICD-10-CM | POA: Diagnosis not present

## 2021-08-25 DIAGNOSIS — M545 Low back pain, unspecified: Secondary | ICD-10-CM

## 2021-08-25 DIAGNOSIS — R262 Difficulty in walking, not elsewhere classified: Secondary | ICD-10-CM

## 2021-08-25 NOTE — Therapy (Signed)
[facility] [address], [place], [place], [address] Phone: [phone]   Fax:  [phone]  Physical Therapy Treatment  Patient Details  Name: Larry [name] MRN: [id number] Date of Birth: 09/24/1932 Referring Provider (PT): Dr. Ellene Route   Encounter Date: 08/25/2021   PT End of Session - 08/25/21 1133     Visit Number 6    Date for PT Re-Evaluation 09/29/21    Authorization Type Medicare    PT Start Time 1100    PT Stop Time 1138    PT Time Calculation (min) 38 min    Activity Tolerance Patient tolerated treatment well             Past Medical History:  Diagnosis Date   Allergic rhinitis, cause unspecified 03/12/2013   Allergy    CAD (coronary artery disease)    S/P CABG in 1999    Cataract    Decreased exercise tolerance    Exertional fatigue   Degenerative arthritis of hip    Diverticulosis of colon    ED (erectile dysfunction)    Erectile dysfunction 06/03/2014   History of colonic polyps    History of kidney stones    History of MI (myocardial infarction)    Hx of adenomatous colonic polyps 2000   Hyperlipidemia    Low HDL   Lumbar disc disease    Hx of with recent back pain and buttock pain   Myocardial infarction ([place]) 1999   Right knee meniscal tear    S/P removal of thyroid nodule    Spinal stenosis of lumbar region 03/15/2014    Past Surgical History:  Procedure Laterality Date   [place]   CYSTOSCOPY/URETEROSCOPY/HOLMIUM LASER/STENT PLACEMENT Left 12/23/2017   Procedure: CYSTOSCOPY/RETROGRADE/URETEROSCOPY/HOLMIUM LASER/STENT PLACEMENT;  Surgeon: Festus Aloe, MD;  [facility];  Service: Urology;  Laterality: Left;  ONLY NEEDS 60 MIN   EYE SURGERY Bilateral    bilaterl cataract removal   LUMBAR DISC SURGERY     LUMBAR LAMINECTOMY/DECOMPRESSION  MICRODISCECTOMY Bilateral 07/06/2021   Procedure: Bilateral Lumbar three-four Laminectomy and foraminotomy;  Surgeon: Kristeen Miss, MD;  Location: [place];  Service: Neurosurgery;  Laterality: Bilateral;   Nodules on Thyroid     POLYPECTOMY     PTCA     SHOULDER ARTHROSCOPY WITH ROTATOR CUFF REPAIR AND SUBACROMIAL DECOMPRESSION Right 01/30/2015   Procedure: RIGHT ARTHROSCOPY SHOULDER SUBACROMIAL DECOMPRESSION DISTAL CLAVICAL RESECTION RETATOR CUFF REPAIR;  Surgeon: Justice Britain, MD;  Location: [place];  Service: Orthopedics;  Laterality: Right;   TONSILLECTOMY     VASECTOMY      There were no vitals filed for this visit.   Subjective Assessment - 08/25/21 1104     Subjective Some discomfort in the front thighs (right more than left); comes and goes.  Went to the park on Sunday.  I can stand up straight but I don't have the endurance I thought I would.  I can go up on my toes now but couldn't before surgery.    Pertinent History L3-4 lami 7/18    Patient Stated Goals get endurance up, stand longer, walk further    Currently in Pain? No/denies    Pain Score 0-No pain                [facility] PT Assessment - 08/25/21 0001  Balance   Balance Assessed --   demo staggered stand and tandem stand 10 sec on each side                          [facility] Adult PT Treatment/Exercise - 08/25/21 0001       Lumbar Exercises: Stretches   Other Lumbar Stretch Exercise 2nd step hip flexor stretch      Lumbar Exercises: Standing   Row Strengthening;Both;15 reps;Theraband    Theraband Level (Row) Level 3 (Green)    Other Standing Lumbar Exercises dead lifts touch butt to wall 6x    Other Standing Lumbar Exercises dead lifts with 2 4# weights butt touch to wall 7x      Lumbar Exercises: Seated   Other Seated Lumbar Exercises seated 5# plyo ball chops 10x each way    Other Seated Lumbar Exercises mini chair sit ups 5# 10x      Knee/Hip Exercises: Aerobic   Nustep seat 9 L2 10  min while discussing status/progress      Knee/Hip Exercises: Machines for Strengthening   Cybex Leg Press 70# bil 15x; 35# single leg 10x each      Knee/Hip Exercises: Standing   Heel Raises Both;10 reps;1 set    Heel Raises Limitations Requires UE support    Forward Step Up Both;10 reps;Hand Hold: 2;Step Height: 6";2 sets                      PT Short Term Goals - 08/07/21 1041       PT SHORT TERM GOAL #1   Title Pt will be independent with initial HEP to help with lumbar post surgical healing    Time 4    Period Weeks    Status Achieved    Target Date 09/01/21      PT SHORT TERM GOAL #4   Title Pt will safely negotiate flight of steps at home with cane and 1 railing to access his [employer] bike    Time 4    Period Weeks    Status Achieved    Target Date 09/10/20               PT Long Term Goals - 08/04/21 2016       PT LONG TERM GOAL #1   Title Pt will demonstrate independence with final HEP    Time 8    Period Weeks    Status New    Target Date 09/29/21      PT LONG TERM GOAL #2   Title FOTO score improved from 52% to 66%    Time 8    Period Weeks    Status New      PT LONG TERM GOAL #3   Title The patient will have improved trunk strength to 4/5 needed for improved tolerance to standing/walking for > 15 minutes needed for community mobility    Time 8    Period Weeks    Status New      PT LONG TERM GOAL #4   Title The patient will be able to walk 750 feet with cane needed to return to the park    Time 8    Period Weeks    Status New      PT LONG TERM GOAL #5   Title Timed up and Go improved < 12 sec indicating improved gait speed and LE strength    Time 8  Period Weeks    Status New                   Plan - 08/25/21 1134     Clinical Impression Statement Good hip hinge technique with cue to "touch butt to the wall".  Able to add light weight to the task as well.  He demonstrates excellent carryover and compliance with  his HEP.  Therapist closely monitoring response throughout session.  He denies pain or soreness post session, only general fatigue appropriate to exercise intensity.     Comorbidities right shoulder surgery; right knee surgery; remote cardiac history    Rehab Potential Good    PT Frequency 2x / week    PT Duration 8 weeks    PT Treatment/Interventions ADLs/Self Care Home Management;Aquatic Therapy;Cryotherapy;Electrical Stimulation;Moist Heat;Functional mobility training;Therapeutic activities;Therapeutic exercise;Neuromuscular re-education;Manual techniques;Patient/family education;Taping    PT Next Visit Plan check STGS next visit (sit to stand 3x); trunk and LE strengthening; balance,   Nu-Step; hip hinge    PT Home Exercise Plan [id number]             Patient will benefit from skilled therapeutic intervention in order to improve the following deficits and impairments:  Decreased range of motion, Difficulty walking, Decreased activity tolerance, Pain, Decreased strength  Visit Diagnosis: Muscle weakness (generalized)  Bilateral low back pain, unspecified chronicity, unspecified whether sciatica present  Difficulty in walking, not elsewhere classified     Problem List Patient Active Problem List   Diagnosis Date Noted   Spinal stenosis of lumbar region with neurogenic claudication 07/07/2021   Spinal stenosis, lumbar region, with neurogenic claudication 07/06/2021   Dysfunction of Eustachian tube, bilateral 07/21/2020   Gait disorder 04/20/2016   Hyperglycemia 04/20/2016   Abnormal CT scan, chest 06/03/2014   Erectile dysfunction 06/03/2014   Spinal stenosis of lumbar region 03/15/2014   Cough 03/12/2013   Allergic rhinitis 03/12/2013   Leg pain, bilateral 12/30/2011   Bladder neck obstruction 12/30/2011   Preventative health care 12/25/2011   Lumbar disc disease 12/25/2011   S/P removal of thyroid nodule 12/25/2011   History of MI (myocardial infarction) 12/25/2011    Right knee meniscal tear 12/25/2011   Degenerative arthritis of hip 12/25/2011   HIP PAIN, RIGHT 05/14/2010   FATIGUE 10/13/2009   HYPERTENSION, BENIGN 02/25/2009   CAD, AUTOLOGOUS BYPASS GRAFT 02/25/2009   CORONARY ARTERY ANEURYSM 02/25/2009   BACK PAIN 04/15/2008   Hyperlipidemia 02/13/2008   CORONARY ARTERY DISEASE 02/13/2008   DIVERTICULOSIS, COLON 02/13/2008   CARDIAC DISEASE, HX OF 02/13/2008   COLONIC POLYPS, HX OF 02/13/2008   Ruben Im, PT 08/25/21 11:44 AM Phone: [phone] Fax: [phone]  Alvera Singh 08/25/2021, 11:44 AM  [facility] [address], [place], [place], [address] Phone: [phone]   Fax:  [phone]  Name: JVION MIAZGA MRN: [id number] Date of Birth: December 13, 1932

## 2021-08-27 ENCOUNTER — Ambulatory Visit: Payer: Medicare Other

## 2021-08-27 ENCOUNTER — Other Ambulatory Visit: Payer: Self-pay

## 2021-08-27 DIAGNOSIS — R262 Difficulty in walking, not elsewhere classified: Secondary | ICD-10-CM

## 2021-08-27 DIAGNOSIS — M545 Low back pain, unspecified: Secondary | ICD-10-CM

## 2021-08-27 DIAGNOSIS — M6281 Muscle weakness (generalized): Secondary | ICD-10-CM | POA: Diagnosis not present

## 2021-08-27 NOTE — Therapy (Signed)
[facility] [address], [place], [place], [address] Phone: [phone]   Fax:  [phone]  Physical Therapy Treatment  Patient Details  Name: Larry [name] MRN: [id number] Date of Birth: 1932-10-23 Referring Provider (PT): Dr. Ellene Route   Encounter Date: 08/27/2021   PT End of Session - 08/27/21 1227     Visit Number 7    Date for PT Re-Evaluation 09/29/21    Authorization Type Medicare    PT Start Time 1146    PT Stop Time 1227    PT Time Calculation (min) 41 min    Activity Tolerance Patient tolerated treatment well    Behavior During Therapy [facility] for tasks assessed/performed             Past Medical History:  Diagnosis Date   Allergic rhinitis, cause unspecified 03/12/2013   Allergy    CAD (coronary artery disease)    S/P CABG in 1999    Cataract    Decreased exercise tolerance    Exertional fatigue   Degenerative arthritis of hip    Diverticulosis of colon    ED (erectile dysfunction)    Erectile dysfunction 06/03/2014   History of colonic polyps    History of kidney stones    History of MI (myocardial infarction)    Hx of adenomatous colonic polyps 2000   Hyperlipidemia    Low HDL   Lumbar disc disease    Hx of with recent back pain and buttock pain   Myocardial infarction ([place]) 1999   Right knee meniscal tear    S/P removal of thyroid nodule    Spinal stenosis of lumbar region 03/15/2014    Past Surgical History:  Procedure Laterality Date   [place]   CYSTOSCOPY/URETEROSCOPY/HOLMIUM LASER/STENT PLACEMENT Left 12/23/2017   Procedure: CYSTOSCOPY/RETROGRADE/URETEROSCOPY/HOLMIUM LASER/STENT PLACEMENT;  Surgeon: Festus Aloe, MD;  [facility];  Service: Urology;  Laterality: Left;  ONLY NEEDS 60 MIN   EYE SURGERY Bilateral    bilaterl cataract removal   LUMBAR  DISC SURGERY     LUMBAR LAMINECTOMY/DECOMPRESSION MICRODISCECTOMY Bilateral 07/06/2021   Procedure: Bilateral Lumbar three-four Laminectomy and foraminotomy;  Surgeon: Kristeen Miss, MD;  Location: [place];  Service: Neurosurgery;  Laterality: Bilateral;   Nodules on Thyroid     POLYPECTOMY     PTCA     SHOULDER ARTHROSCOPY WITH ROTATOR CUFF REPAIR AND SUBACROMIAL DECOMPRESSION Right 01/30/2015   Procedure: RIGHT ARTHROSCOPY SHOULDER SUBACROMIAL DECOMPRESSION DISTAL CLAVICAL RESECTION RETATOR CUFF REPAIR;  Surgeon: Justice Britain, MD;  Location: [place];  Service: Orthopedics;  Laterality: Right;   TONSILLECTOMY     VASECTOMY      There were no vitals filed for this visit.   Subjective Assessment - 08/27/21 1151     Subjective I'm doing well.  Walking in the park daily.    Currently in Pain? No/denies                               [facility] Adult PT Treatment/Exercise - 08/27/21 0001       Lumbar Exercises: Stretches   Other Lumbar Stretch Exercise 2nd step hip flexor stretch      Lumbar Exercises: Standing   Row Strengthening;Both;15 reps;Theraband    Theraband Level (Row) Level 3 (Green)  Lumbar Exercises: Seated   Other Seated Lumbar Exercises mini chair sit ups 5# 2x10      Knee/Hip Exercises: Aerobic   Nustep seat 9 L2 10 min while discussing status/progress      Knee/Hip Exercises: Machines for Strengthening   Cybex Leg Press 70# bil 3x10, 35# single leg 2x10each                 Balance Exercises - 08/27/21 0001       Balance Exercises: Standing   Tandem Stance Upper extremity support 1;3 reps;10 secs    Other Standing Exercises box step on discs on floor x 5 laps each direction- gait belt used    Other Standing Exercises Comments sidestepping and alternating toe taps on low discs -gait belt used                  PT Short Term Goals - 08/27/21 1222       PT SHORT TERM GOAL #2   Title The patient will have LE strength needed to  rise sit to standing 3x without UE use    Baseline 5x without difficulty               PT Long Term Goals - 08/04/21 2016       PT LONG TERM GOAL #1   Title Pt will demonstrate independence with final HEP    Time 8    Period Weeks    Status New    Target Date 09/29/21      PT LONG TERM GOAL #2   Title FOTO score improved from 52% to 66%    Time 8    Period Weeks    Status New      PT LONG TERM GOAL #3   Title The patient will have improved trunk strength to 4/5 needed for improved tolerance to standing/walking for > 15 minutes needed for community mobility    Time 8    Period Weeks    Status New      PT LONG TERM GOAL #4   Title The patient will be able to walk 750 feet with cane needed to return to the park    Time 8    Period Weeks    Status New      PT LONG TERM GOAL #5   Title Timed up and Go improved < 12 sec indicating improved gait speed and LE strength    Time 8    Period Weeks    Status New                   Plan - 08/27/21 1220     Clinical Impression Statement Pt continues to stay active, completing HEP and walking at the park.   He demonstrates excellent carryover and compliance with his HEP.  PT provided min to mod assistance with balance tasks and pt fatigued with this.  Pt with improved LE strength and was able to perform 5x sit to stand wihtout UE support.  PT closely monitoring response throughout session.  He denies pain or soreness post session, only general fatigue appropriate to exercise intensity.    PT Frequency 2x / week    PT Duration 8 weeks    PT Treatment/Interventions ADLs/Self Care Home Management;Aquatic Therapy;Cryotherapy;Electrical Stimulation;Moist Heat;Functional mobility training;Therapeutic activities;Therapeutic exercise;Neuromuscular re-education;Manual techniques;Patient/family education;Taping    PT Next Visit Plan trunk and LE strengthening; balance,   Nu-Step; hip hinge    PT Home Exercise Plan [id number]  Consulted and Agree with Plan of Care Patient             Patient will benefit from skilled therapeutic intervention in order to improve the following deficits and impairments:  Decreased range of motion, Difficulty walking, Decreased activity tolerance, Pain, Decreased strength  Visit Diagnosis: Muscle weakness (generalized)  Bilateral low back pain, unspecified chronicity, unspecified whether sciatica present  Difficulty in walking, not elsewhere classified     Problem List Patient Active Problem List   Diagnosis Date Noted   Spinal stenosis of lumbar region with neurogenic claudication 07/07/2021   Spinal stenosis, lumbar region, with neurogenic claudication 07/06/2021   Dysfunction of Eustachian tube, bilateral 07/21/2020   Gait disorder 04/20/2016   Hyperglycemia 04/20/2016   Abnormal CT scan, chest 06/03/2014   Erectile dysfunction 06/03/2014   Spinal stenosis of lumbar region 03/15/2014   Cough 03/12/2013   Allergic rhinitis 03/12/2013   Leg pain, bilateral 12/30/2011   Bladder neck obstruction 12/30/2011   Preventative health care 12/25/2011   Lumbar disc disease 12/25/2011   S/P removal of thyroid nodule 12/25/2011   History of MI (myocardial infarction) 12/25/2011   Right knee meniscal tear 12/25/2011   Degenerative arthritis of hip 12/25/2011   HIP PAIN, RIGHT 05/14/2010   FATIGUE 10/13/2009   HYPERTENSION, BENIGN 02/25/2009   CAD, AUTOLOGOUS BYPASS GRAFT 02/25/2009   CORONARY ARTERY ANEURYSM 02/25/2009   BACK PAIN 04/15/2008   Hyperlipidemia 02/13/2008   CORONARY ARTERY DISEASE 02/13/2008   DIVERTICULOSIS, COLON 02/13/2008   CARDIAC DISEASE, HX OF 02/13/2008   COLONIC POLYPS, HX OF 02/13/2008  Sigurd Sos, PT 08/27/21 12:29 PM  [facility] [address] [place], [place], [address] Phone: [phone]   Fax:  [phone]  Name: RAYMOND KALLAY MRN: [id number] Date of Birth:  03-10-1932

## 2021-08-31 ENCOUNTER — Other Ambulatory Visit: Payer: Self-pay

## 2021-08-31 ENCOUNTER — Ambulatory Visit: Payer: Medicare Other

## 2021-08-31 DIAGNOSIS — R296 Repeated falls: Secondary | ICD-10-CM

## 2021-08-31 DIAGNOSIS — M6281 Muscle weakness (generalized): Secondary | ICD-10-CM

## 2021-08-31 DIAGNOSIS — M545 Low back pain, unspecified: Secondary | ICD-10-CM

## 2021-08-31 DIAGNOSIS — R262 Difficulty in walking, not elsewhere classified: Secondary | ICD-10-CM

## 2021-08-31 NOTE — Therapy (Signed)
[facility] [address], [place], [place], [address] Phone: [phone]   Fax:  [phone]  Physical Therapy Treatment  Patient Details  Name: Larry [name] MRN: [id number] Date of Birth: 07-30-1932 Referring Provider (PT): Dr. Ellene Route   Encounter Date: 08/31/2021   PT End of Session - 08/31/21 1230     Visit Number 8    Date for PT Re-Evaluation 09/29/21    Authorization Type Medicare    PT Start Time 1145    PT Stop Time 4356    PT Time Calculation (min) 45 min    Activity Tolerance Patient tolerated treatment well    Behavior During Therapy [facility] for tasks assessed/performed             Past Medical History:  Diagnosis Date   Allergic rhinitis, cause unspecified 03/12/2013   Allergy    CAD (coronary artery disease)    S/P CABG in 1999    Cataract    Decreased exercise tolerance    Exertional fatigue   Degenerative arthritis of hip    Diverticulosis of colon    ED (erectile dysfunction)    Erectile dysfunction 06/03/2014   History of colonic polyps    History of kidney stones    History of MI (myocardial infarction)    Hx of adenomatous colonic polyps 2000   Hyperlipidemia    Low HDL   Lumbar disc disease    Hx of with recent back pain and buttock pain   Myocardial infarction ([place]) 1999   Right knee meniscal tear    S/P removal of thyroid nodule    Spinal stenosis of lumbar region 03/15/2014    Past Surgical History:  Procedure Laterality Date   [place]   CYSTOSCOPY/URETEROSCOPY/HOLMIUM LASER/STENT PLACEMENT Left 12/23/2017   Procedure: CYSTOSCOPY/RETROGRADE/URETEROSCOPY/HOLMIUM LASER/STENT PLACEMENT;  Surgeon: Festus Aloe, MD;  [facility];  Service: Urology;  Laterality: Left;  ONLY NEEDS 60 MIN   EYE SURGERY Bilateral    bilaterl cataract removal   LUMBAR  DISC SURGERY     LUMBAR LAMINECTOMY/DECOMPRESSION MICRODISCECTOMY Bilateral 07/06/2021   Procedure: Bilateral Lumbar three-four Laminectomy and foraminotomy;  Surgeon: Kristeen Miss, MD;  Location: [place];  Service: Neurosurgery;  Laterality: Bilateral;   Nodules on Thyroid     POLYPECTOMY     PTCA     SHOULDER ARTHROSCOPY WITH ROTATOR CUFF REPAIR AND SUBACROMIAL DECOMPRESSION Right 01/30/2015   Procedure: RIGHT ARTHROSCOPY SHOULDER SUBACROMIAL DECOMPRESSION DISTAL CLAVICAL RESECTION RETATOR CUFF REPAIR;  Surgeon: Justice Britain, MD;  Location: [place];  Service: Orthopedics;  Laterality: Right;   TONSILLECTOMY     VASECTOMY      There were no vitals filed for this visit.   Subjective Assessment - 08/31/21 1200     Subjective I've been walking regularly and when I can't walk, I double up my exercises.    Patient Stated Goals get endurance up, stand longer, walk further    Currently in Pain? No/denies                [facility] PT Assessment - 08/31/21 0001       Timed Up and Go Test   Normal TUG (seconds) 10.7    TUG Comments without cane  [place] Adult PT Treatment/Exercise - 08/31/21 0001       Lumbar Exercises: Stretches   Other Lumbar Stretch Exercise 2nd step hip flexor stretch      Lumbar Exercises: Standing   Row Strengthening;Both;15 reps;Theraband    Theraband Level (Row) Level 3 (Green)      Lumbar Exercises: Seated   Other Seated Lumbar Exercises mini chair sit ups 5# 2x10      Knee/Hip Exercises: Aerobic   Nustep seat 9 L2 10 min while discussing status/progress      Knee/Hip Exercises: Machines for Strengthening   Cybex Leg Press 70# bil 3x10, 35# single leg 2x10each      Knee/Hip Exercises: Standing   Heel Raises Both;10 reps;1 set    Heel Raises Limitations Requires UE support                 Balance Exercises - 08/31/21 0001       Balance Exercises: Standing   Other Standing Exercises box step on discs  on floor x 5 laps each direction- gait belt used                  PT Short Term Goals - 08/27/21 1222       PT SHORT TERM GOAL #2   Title The patient will have LE strength needed to rise sit to standing 3x without UE use    Baseline 5x without difficulty               PT Long Term Goals - 08/31/21 1203       PT LONG TERM GOAL #1   Title Pt will demonstrate independence with final HEP    Status On-going      PT LONG TERM GOAL #3   Title The patient will have improved trunk strength to 4/5 needed for improved tolerance to standing/walking for > 15 minutes needed for community mobility    Baseline can walk 8-10 min and then sits to rest    Status On-going      PT LONG TERM GOAL #5   Title Timed up and Go improved < 12 sec indicating improved gait speed and LE strength    Baseline 10.7 without cane    Status Achieved                   Plan - 08/31/21 1211     Clinical Impression Statement Pt continues to stay active, completing HEP and walking at the park.   He demonstrates excellent carryover and compliance with his HEP.  Pt walks 8-10 minutes in the park and then sits to rest before walking more. Pt met goal for TUG time at 10.66 seconds without use of cane, indicating improved balance overall.  Pt with reduced step length and limited hip rotation with all ambulation. PT provided min to mod assistance with balance tasks and pt fatigued with these tasks.    PT closely monitoring response throughout session.  He denies pain or soreness post session, only general fatigue appropriate to exercise intensity.    PT Frequency 2x / week    PT Duration 8 weeks    PT Treatment/Interventions ADLs/Self Care Home Management;Aquatic Therapy;Cryotherapy;Electrical Stimulation;Moist Heat;Functional mobility training;Therapeutic activities;Therapeutic exercise;Neuromuscular re-education;Manual techniques;Patient/family education;Taping    PT Next Visit Plan trunk and LE  strengthening; balance,   Nu-Step; hip hinge    PT Home Exercise Plan [id number]    Consulted and Agree with Plan of Care Patient  Patient will benefit from skilled therapeutic intervention in order to improve the following deficits and impairments:  Decreased range of motion, Difficulty walking, Decreased activity tolerance, Pain, Decreased strength  Visit Diagnosis: Muscle weakness (generalized)  Bilateral low back pain, unspecified chronicity, unspecified whether sciatica present  Difficulty in walking, not elsewhere classified  Repeated falls     Problem List Patient Active Problem List   Diagnosis Date Noted   Spinal stenosis of lumbar region with neurogenic claudication 07/07/2021   Spinal stenosis, lumbar region, with neurogenic claudication 07/06/2021   Dysfunction of Eustachian tube, bilateral 07/21/2020   Gait disorder 04/20/2016   Hyperglycemia 04/20/2016   Abnormal CT scan, chest 06/03/2014   Erectile dysfunction 06/03/2014   Spinal stenosis of lumbar region 03/15/2014   Cough 03/12/2013   Allergic rhinitis 03/12/2013   Leg pain, bilateral 12/30/2011   Bladder neck obstruction 12/30/2011   Preventative health care 12/25/2011   Lumbar disc disease 12/25/2011   S/P removal of thyroid nodule 12/25/2011   History of MI (myocardial infarction) 12/25/2011   Right knee meniscal tear 12/25/2011   Degenerative arthritis of hip 12/25/2011   HIP PAIN, RIGHT 05/14/2010   FATIGUE 10/13/2009   HYPERTENSION, BENIGN 02/25/2009   CAD, AUTOLOGOUS BYPASS GRAFT 02/25/2009   CORONARY ARTERY ANEURYSM 02/25/2009   BACK PAIN 04/15/2008   Hyperlipidemia 02/13/2008   CORONARY ARTERY DISEASE 02/13/2008   DIVERTICULOSIS, COLON 02/13/2008   CARDIAC DISEASE, HX OF 02/13/2008   COLONIC POLYPS, HX OF 02/13/2008    Sigurd Sos, PT 08/31/21 12:31 PM   [facility] [address], [place], [place],  [address] Phone: [phone]   Fax:  [phone]  Name: Larry [name] MRN: [id number] Date of Birth: 1932/06/15

## 2021-09-01 DIAGNOSIS — L821 Other seborrheic keratosis: Secondary | ICD-10-CM | POA: Diagnosis not present

## 2021-09-01 DIAGNOSIS — L570 Actinic keratosis: Secondary | ICD-10-CM | POA: Diagnosis not present

## 2021-09-01 DIAGNOSIS — Z85828 Personal history of other malignant neoplasm of skin: Secondary | ICD-10-CM | POA: Diagnosis not present

## 2021-09-01 DIAGNOSIS — L578 Other skin changes due to chronic exposure to nonionizing radiation: Secondary | ICD-10-CM | POA: Diagnosis not present

## 2021-09-01 DIAGNOSIS — L280 Lichen simplex chronicus: Secondary | ICD-10-CM | POA: Diagnosis not present

## 2021-09-01 DIAGNOSIS — L814 Other melanin hyperpigmentation: Secondary | ICD-10-CM | POA: Diagnosis not present

## 2021-09-03 ENCOUNTER — Other Ambulatory Visit: Payer: Self-pay

## 2021-09-03 ENCOUNTER — Ambulatory Visit: Payer: Medicare Other

## 2021-09-03 DIAGNOSIS — M6281 Muscle weakness (generalized): Secondary | ICD-10-CM | POA: Diagnosis not present

## 2021-09-03 DIAGNOSIS — M545 Low back pain, unspecified: Secondary | ICD-10-CM

## 2021-09-03 DIAGNOSIS — R262 Difficulty in walking, not elsewhere classified: Secondary | ICD-10-CM

## 2021-09-03 DIAGNOSIS — Z23 Encounter for immunization: Secondary | ICD-10-CM | POA: Diagnosis not present

## 2021-09-03 NOTE — Therapy (Signed)
[facility] [address], [place], [place], [address] Phone: [phone]   Fax:  [phone]  Physical Therapy Treatment  Patient Details  Name: Larry [name] MRN: [id number] Date of Birth: May 30, 1932 Referring Provider (PT): Dr. Ellene Route   Encounter Date: 09/03/2021   PT End of Session - 09/03/21 1229     Visit Number 9    Date for PT Re-Evaluation 09/29/21    Authorization Type Medicare    PT Start Time 1145    PT Stop Time 1228    PT Time Calculation (min) 43 min    Activity Tolerance Patient tolerated treatment well    Behavior During Therapy [facility] for tasks assessed/performed             Past Medical History:  Diagnosis Date   Allergic rhinitis, cause unspecified 03/12/2013   Allergy    CAD (coronary artery disease)    S/P CABG in 1999    Cataract    Decreased exercise tolerance    Exertional fatigue   Degenerative arthritis of hip    Diverticulosis of colon    ED (erectile dysfunction)    Erectile dysfunction 06/03/2014   History of colonic polyps    History of kidney stones    History of MI (myocardial infarction)    Hx of adenomatous colonic polyps 2000   Hyperlipidemia    Low HDL   Lumbar disc disease    Hx of with recent back pain and buttock pain   Myocardial infarction ([place]) 1999   Right knee meniscal tear    S/P removal of thyroid nodule    Spinal stenosis of lumbar region 03/15/2014    Past Surgical History:  Procedure Laterality Date   [place]   CYSTOSCOPY/URETEROSCOPY/HOLMIUM LASER/STENT PLACEMENT Left 12/23/2017   Procedure: CYSTOSCOPY/RETROGRADE/URETEROSCOPY/HOLMIUM LASER/STENT PLACEMENT;  Surgeon: Festus Aloe, MD;  [facility];  Service: Urology;  Laterality: Left;  ONLY NEEDS 60 MIN   EYE SURGERY Bilateral    bilaterl cataract removal   LUMBAR  DISC SURGERY     LUMBAR LAMINECTOMY/DECOMPRESSION MICRODISCECTOMY Bilateral 07/06/2021   Procedure: Bilateral Lumbar three-four Laminectomy and foraminotomy;  Surgeon: Kristeen Miss, MD;  Location: [place];  Service: Neurosurgery;  Laterality: Bilateral;   Nodules on Thyroid     POLYPECTOMY     PTCA     SHOULDER ARTHROSCOPY WITH ROTATOR CUFF REPAIR AND SUBACROMIAL DECOMPRESSION Right 01/30/2015   Procedure: RIGHT ARTHROSCOPY SHOULDER SUBACROMIAL DECOMPRESSION DISTAL CLAVICAL RESECTION RETATOR CUFF REPAIR;  Surgeon: Justice Britain, MD;  Location: [place];  Service: Orthopedics;  Laterality: Right;   TONSILLECTOMY     VASECTOMY      There were no vitals filed for this visit.   Subjective Assessment - 09/03/21 1155     Subjective I am going to go to [place] at the end of the month.  I saw the surgeon this week and he was pleased and released.    Currently in Pain? No/denies                               [facility] Adult PT Treatment/Exercise - 09/03/21 0001       Lumbar Exercises: Stretches   Other Lumbar Stretch Exercise 2nd step hip flexor stretch      Lumbar Exercises:  Standing   Row Strengthening;Both;15 reps;Theraband    Theraband Level (Row) Level 3 (Green)      Lumbar Exercises: Seated   Other Seated Lumbar Exercises diagonal chops with 5# kettle bell 2x10 each    Other Seated Lumbar Exercises mini chair sit ups 5# 2x10      Knee/Hip Exercises: Aerobic   Nustep seat 9 L2 10 min while discussing status/progress      Knee/Hip Exercises: Machines for Strengthening   Cybex Leg Press 70# bil 3x10, 35# single leg 2x10each      Knee/Hip Exercises: Standing   Heel Raises Both;10 reps;1 set                 Balance Exercises - 09/03/21 0001       Balance Exercises: Standing   Tandem Stance Upper extremity support 1;3 reps;10 secs    Other Standing Exercises box step on discs on floor x 5 laps each direction- gait belt used                  PT  Short Term Goals - 08/27/21 1222       PT SHORT TERM GOAL #2   Title The patient will have LE strength needed to rise sit to standing 3x without UE use    Baseline 5x without difficulty               PT Long Term Goals - 08/31/21 1203       PT LONG TERM GOAL #1   Title Pt will demonstrate independence with final HEP    Status On-going      PT LONG TERM GOAL #3   Title The patient will have improved trunk strength to 4/5 needed for improved tolerance to standing/walking for > 15 minutes needed for community mobility    Baseline can walk 8-10 min and then sits to rest    Status On-going      PT LONG TERM GOAL #5   Title Timed up and Go improved < 12 sec indicating improved gait speed and LE strength    Baseline 10.7 without cane    Status Achieved                   Plan - 09/03/21 1207     Clinical Impression Statement Pt continues to stay active, completing HEP and walking at the park.   He demonstrates excellent carryover and compliance with his HEP.  Pt walks 8-10 minutes in the park and then sits to rest before walking more. Pt met goal for TUG time at 10.66 seconds this week without use of cane, indicating improved balance overall.  Pt with reduced step length and limited hip rotation with all ambulation. PT provided min to mod assistance with balance tasks and pt fatigued with these tasks as pt is sufficiently challenged with current level of activity in the clinic.vc PT closely monitoring response throughout session.    PT Treatment/Interventions ADLs/Self Care Home Management;Aquatic Therapy;Cryotherapy;Electrical Stimulation;Moist Heat;Functional mobility training;Therapeutic activities;Therapeutic exercise;Neuromuscular re-education;Manual techniques;Patient/family education;Taping    PT Next Visit Plan trunk and LE strengthening; balance,   Nu-Step; hip hinge    PT Home Exercise Plan [id number]    Consulted and Agree with Plan of Care Patient              Patient will benefit from skilled therapeutic intervention in order to improve the following deficits and impairments:  Decreased range of motion, Difficulty walking, Decreased activity tolerance, Pain, Decreased strength  Visit Diagnosis: Bilateral low back pain, unspecified chronicity, unspecified whether sciatica present  Difficulty in walking, not elsewhere classified     Problem List Patient Active Problem List   Diagnosis Date Noted   Spinal stenosis of lumbar region with neurogenic claudication 07/07/2021   Spinal stenosis, lumbar region, with neurogenic claudication 07/06/2021   Dysfunction of Eustachian tube, bilateral 07/21/2020   Gait disorder 04/20/2016   Hyperglycemia 04/20/2016   Abnormal CT scan, chest 06/03/2014   Erectile dysfunction 06/03/2014   Spinal stenosis of lumbar region 03/15/2014   Cough 03/12/2013   Allergic rhinitis 03/12/2013   Leg pain, bilateral 12/30/2011   Bladder neck obstruction 12/30/2011   Preventative health care 12/25/2011   Lumbar disc disease 12/25/2011   S/P removal of thyroid nodule 12/25/2011   History of MI (myocardial infarction) 12/25/2011   Right knee meniscal tear 12/25/2011   Degenerative arthritis of hip 12/25/2011   HIP PAIN, RIGHT 05/14/2010   FATIGUE 10/13/2009   HYPERTENSION, BENIGN 02/25/2009   CAD, AUTOLOGOUS BYPASS GRAFT 02/25/2009   CORONARY ARTERY ANEURYSM 02/25/2009   BACK PAIN 04/15/2008   Hyperlipidemia 02/13/2008   CORONARY ARTERY DISEASE 02/13/2008   DIVERTICULOSIS, COLON 02/13/2008   CARDIAC DISEASE, HX OF 02/13/2008   COLONIC POLYPS, HX OF 02/13/2008   Sigurd Sos, PT 09/03/21 12:31 PM   [facility] [address] [place], [place], [address] Phone: [phone]   Fax:  [phone]  Name: Larry [name] MRN: [id number] Date of Birth: December 13, 1932

## 2021-09-07 ENCOUNTER — Other Ambulatory Visit: Payer: Self-pay

## 2021-09-07 ENCOUNTER — Ambulatory Visit: Payer: Medicare Other

## 2021-09-07 DIAGNOSIS — M545 Low back pain, unspecified: Secondary | ICD-10-CM

## 2021-09-07 DIAGNOSIS — M6281 Muscle weakness (generalized): Secondary | ICD-10-CM

## 2021-09-07 DIAGNOSIS — R262 Difficulty in walking, not elsewhere classified: Secondary | ICD-10-CM

## 2021-09-07 NOTE — Therapy (Signed)
[facility] [address], [place], [place], [address] Phone: [phone]   Fax:  [phone]  Physical Therapy Treatment  Patient Details  Name: Larry [name] MRN: [id number] Date of Birth: 16-Oct-1932 Referring Provider (PT): Dr. Ellene Route   Encounter Date: 09/07/2021 Progress Note Reporting Period 08/04/21 to 09/07/21  See note below for Objective Data and Assessment of Progress/Goals.      PT End of Session - 09/07/21 1230     Visit Number 10    Date for PT Re-Evaluation 09/29/21    Authorization Type Medicare    PT Start Time 1147    PT Stop Time 1230    PT Time Calculation (min) 43 min    Activity Tolerance Patient tolerated treatment well    Behavior During Therapy [facility] for tasks assessed/performed             Past Medical History:  Diagnosis Date   Allergic rhinitis, cause unspecified 03/12/2013   Allergy    CAD (coronary artery disease)    S/P CABG in 1999    Cataract    Decreased exercise tolerance    Exertional fatigue   Degenerative arthritis of hip    Diverticulosis of colon    ED (erectile dysfunction)    Erectile dysfunction 06/03/2014   History of colonic polyps    History of kidney stones    History of MI (myocardial infarction)    Hx of adenomatous colonic polyps 2000   Hyperlipidemia    Low HDL   Lumbar disc disease    Hx of with recent back pain and buttock pain   Myocardial infarction ([place]) 1999   Right knee meniscal tear    S/P removal of thyroid nodule    Spinal stenosis of lumbar region 03/15/2014    Past Surgical History:  Procedure Laterality Date   BACK SURGERY  1960   CARDIAC CATHETERIZATION     CATARACT EXTRACTION     COLONOSCOPY     CORONARY ARTERY BYPASS GRAFT  1999   CYSTOSCOPY/URETEROSCOPY/HOLMIUM LASER/STENT PLACEMENT Left 12/23/2017   Procedure: CYSTOSCOPY/RETROGRADE/URETEROSCOPY/HOLMIUM LASER/STENT PLACEMENT;  Surgeon: Festus Aloe, MD;  [facility];  Service: Urology;  Laterality: Left;  ONLY NEEDS 60 MIN   EYE SURGERY Bilateral    bilaterl cataract removal   LUMBAR DISC SURGERY     LUMBAR LAMINECTOMY/DECOMPRESSION MICRODISCECTOMY Bilateral 07/06/2021   Procedure: Bilateral Lumbar three-four Laminectomy and foraminotomy;  Surgeon: Kristeen Miss, MD;  Location: [place];  Service: Neurosurgery;  Laterality: Bilateral;   Nodules on Thyroid     POLYPECTOMY     PTCA     SHOULDER ARTHROSCOPY WITH ROTATOR CUFF REPAIR AND SUBACROMIAL DECOMPRESSION Right 01/30/2015   Procedure: RIGHT ARTHROSCOPY SHOULDER SUBACROMIAL DECOMPRESSION DISTAL CLAVICAL RESECTION RETATOR CUFF REPAIR;  Surgeon: Justice Britain, MD;  Location: [place];  Service: Orthopedics;  Laterality: Right;   TONSILLECTOMY     VASECTOMY      There were no vitals filed for this visit.   Subjective Assessment - 09/07/21 1152     Subjective Doing well.    Patient Stated Goals get endurance up, stand longer, walk further    Currently in Pain? No/denies                [facility] PT Assessment - 09/07/21 0001       Assessment   Medical Diagnosis bil L3-4 lami for stenosis    Referring Provider (PT) Dr. Ellene Route    Onset Date/Surgical Date 07/06/21  Observation/Other Assessments   Focus on Therapeutic Outcomes (FOTO)  51      Timed Up and Go Test   Normal TUG (seconds) 10.7    TUG Comments without cane                           [facility] Adult PT Treatment/Exercise - 09/07/21 0001       Lumbar Exercises: Stretches   Other Lumbar Stretch Exercise 2nd step hip flexor stretch      Lumbar Exercises: Standing   Other Standing Lumbar Exercises alternating step taps on edge of treadmill x20      Lumbar Exercises: Seated   Other Seated Lumbar Exercises diagonal chops with 5# kettle bell 2x10 each    Other Seated Lumbar Exercises mini chair sit ups 5# 2x10      Knee/Hip Exercises: Aerobic   Nustep seat 9 L2 10 min while discussing status/progress       Knee/Hip Exercises: Machines for Strengthening   Cybex Leg Press 70# bil 3x10, 35# single leg 2x10each      Knee/Hip Exercises: Standing   Heel Raises Both;10 reps;1 set                 Balance Exercises - 09/07/21 0001       Balance Exercises: Standing   Tandem Stance Upper extremity support 1;3 reps;10 secs    Other Standing Exercises box step on discs on floor x 5 laps each direction- gait belt used                  PT Short Term Goals - 08/27/21 1222       PT SHORT TERM GOAL #2   Title The patient will have LE strength needed to rise sit to standing 3x without UE use    Baseline 5x without difficulty               PT Long Term Goals - 09/07/21 1209       PT LONG TERM GOAL #1   Title Pt will demonstrate independence with final HEP    Status On-going      PT LONG TERM GOAL #2   Title FOTO score improved from 52% to 66%      PT LONG TERM GOAL #3   Title The patient will have improved trunk strength to 4/5 needed for improved tolerance to standing/walking for > 15 minutes needed for community mobility    Baseline can walk 8-10 min and then sits to rest    Period Weeks    Status On-going      PT LONG TERM GOAL #4   Title The patient will be able to walk 750 feet with cane needed to return to the park    Baseline able to walk in the park 8-11 min    Status Achieved      PT LONG TERM GOAL #5   Title Timed up and Go improved < 12 sec indicating improved gait speed and LE strength    Baseline 10.7 without cane    Status Achieved                   Plan - 09/07/21 1232     Clinical Impression Statement Pt continues to stay active, completing HEP and walking at the park.   He demonstrates excellent carryover and compliance with his HEP.  Pt walks 8-11 minutes in the park and then sits to rest  before walking more. Pt met goal for TUG time at 10.66 seconds last week without use of cane, indicating improved balance overall.  Pt with reduced  step length and limited hip rotation with all ambulation. PT provided min to mod assistance with balance tasks and pt fatigued with these tasks as pt is sufficiently challenged with current level of activity in the clinic.vc PT closely monitoring response throughout session.  Pt will continue to benefit from continued skilled PT to address endurance, balance and strength to improve safety and independence at home and in the community.    PT Treatment/Interventions ADLs/Self Care Home Management;Aquatic Therapy;Cryotherapy;Electrical Stimulation;Moist Heat;Functional mobility training;Therapeutic activities;Therapeutic exercise;Neuromuscular re-education;Manual techniques;Patient/family education;Taping    PT Next Visit Plan trunk and LE strengthening; balance,   Nu-Step; hip hinge    PT Home Exercise Plan [id number]    Consulted and Agree with Plan of Care Patient             Patient will benefit from skilled therapeutic intervention in order to improve the following deficits and impairments:  Decreased range of motion, Difficulty walking, Decreased activity tolerance, Pain, Decreased strength  Visit Diagnosis: Bilateral low back pain, unspecified chronicity, unspecified whether sciatica present  Difficulty in walking, not elsewhere classified  Muscle weakness (generalized)     Problem List Patient Active Problem List   Diagnosis Date Noted   Spinal stenosis of lumbar region with neurogenic claudication 07/07/2021   Spinal stenosis, lumbar region, with neurogenic claudication 07/06/2021   Dysfunction of Eustachian tube, bilateral 07/21/2020   Gait disorder 04/20/2016   Hyperglycemia 04/20/2016   Abnormal CT scan, chest 06/03/2014   Erectile dysfunction 06/03/2014   Spinal stenosis of lumbar region 03/15/2014   Cough 03/12/2013   Allergic rhinitis 03/12/2013   Leg pain, bilateral 12/30/2011   Bladder neck obstruction 12/30/2011   Preventative health care 12/25/2011   Lumbar disc  disease 12/25/2011   S/P removal of thyroid nodule 12/25/2011   History of MI (myocardial infarction) 12/25/2011   Right knee meniscal tear 12/25/2011   Degenerative arthritis of hip 12/25/2011   HIP PAIN, RIGHT 05/14/2010   FATIGUE 10/13/2009   HYPERTENSION, BENIGN 02/25/2009   CAD, AUTOLOGOUS BYPASS GRAFT 02/25/2009   CORONARY ARTERY ANEURYSM 02/25/2009   BACK PAIN 04/15/2008   Hyperlipidemia 02/13/2008   CORONARY ARTERY DISEASE 02/13/2008   DIVERTICULOSIS, COLON 02/13/2008   CARDIAC DISEASE, HX OF 02/13/2008   COLONIC POLYPS, HX OF 02/13/2008     Larry [name], PT 09/07/21 12:33 PM  [facility] [address] [place], [place], [address] Phone: [phone]   Fax:  [phone]  Name: Larry [name] MRN: [id number] Date of Birth: 27-Jan-1932

## 2021-09-14 ENCOUNTER — Ambulatory Visit: Payer: Medicare Other

## 2021-09-14 ENCOUNTER — Other Ambulatory Visit: Payer: Self-pay

## 2021-09-14 DIAGNOSIS — M6281 Muscle weakness (generalized): Secondary | ICD-10-CM | POA: Diagnosis not present

## 2021-09-14 DIAGNOSIS — R262 Difficulty in walking, not elsewhere classified: Secondary | ICD-10-CM

## 2021-09-14 DIAGNOSIS — M545 Low back pain, unspecified: Secondary | ICD-10-CM

## 2021-09-14 NOTE — Therapy (Signed)
[facility] [address], [place], [place], [address] Phone: [phone]   Fax:  [phone]  Physical Therapy Treatment  Patient Details  Name: Larry [name] MRN: [id number] Date of Birth: [birth year] Referring Provider (PT): Dr. Ellene Route   Encounter Date: 09/14/2021   PT End of Session - 09/14/21 1225     Visit Number 11    Date for PT Re-Evaluation 10/12/21    Authorization Type Medicare    PT Start Time 1147    PT Stop Time 1226    PT Time Calculation (min) 39 min    Activity Tolerance Patient tolerated treatment well    Behavior During Therapy [facility] for tasks assessed/performed             Past Medical History:  Diagnosis Date   Allergic rhinitis, cause unspecified 03/12/2013   Allergy    CAD (coronary artery disease)    S/P CABG in 1999    Cataract    Decreased exercise tolerance    Exertional fatigue   Degenerative arthritis of hip    Diverticulosis of colon    ED (erectile dysfunction)    Erectile dysfunction 06/03/2014   History of colonic polyps    History of kidney stones    History of MI (myocardial infarction)    Hx of adenomatous colonic polyps 2000   Hyperlipidemia    Low HDL   Lumbar disc disease    Hx of with recent back pain and buttock pain   Myocardial infarction ([place]) 1999   Right knee meniscal tear    S/P removal of thyroid nodule    Spinal stenosis of lumbar region 03/15/2014    Past Surgical History:  Procedure Laterality Date   [place]   CYSTOSCOPY/URETEROSCOPY/HOLMIUM LASER/STENT PLACEMENT Left 12/23/2017   Procedure: CYSTOSCOPY/RETROGRADE/URETEROSCOPY/HOLMIUM LASER/STENT PLACEMENT;  Surgeon: Festus Aloe, MD;  Location: [facility];  Service: Urology;  Laterality: Left;  ONLY NEEDS 60 MIN   EYE SURGERY Bilateral    bilaterl cataract removal   LUMBAR  DISC SURGERY     LUMBAR LAMINECTOMY/DECOMPRESSION MICRODISCECTOMY Bilateral 07/06/2021   Procedure: Bilateral Lumbar three-four Laminectomy and foraminotomy;  Surgeon: Kristeen Miss, MD;  Location: [place];  Service: Neurosurgery;  Laterality: Bilateral;   Nodules on Thyroid     POLYPECTOMY     PTCA     SHOULDER ARTHROSCOPY WITH ROTATOR CUFF REPAIR AND SUBACROMIAL DECOMPRESSION Right 01/30/2015   Procedure: RIGHT ARTHROSCOPY SHOULDER SUBACROMIAL DECOMPRESSION DISTAL CLAVICAL RESECTION RETATOR CUFF REPAIR;  Surgeon: Justice Britain, MD;  Location: [place];  Service: Orthopedics;  Laterality: Right;   TONSILLECTOMY     VASECTOMY      There were no vitals filed for this visit.   Subjective Assessment - 09/14/21 1228     Subjective I'm traveling to [place] at the end of this week.    Currently in Pain? No/denies                [facility] PT Assessment - 09/14/21 0001       Assessment   Medical Diagnosis bil L3-4 lami for stenosis    Referring Provider (PT) Dr. Ellene Route    Onset Date/Surgical Date 07/06/21      Observation/Other Assessments   Focus on Therapeutic Outcomes (FOTO)  51      Timed Up and Go Test  Normal TUG (seconds) 10.7    TUG Comments without cane                           [facility] Adult PT Treatment/Exercise - 09/14/21 0001       Lumbar Exercises: Stretches   Other Lumbar Stretch Exercise 2nd step hip flexor stretch      Lumbar Exercises: Standing   Row Strengthening;Both;15 reps;Theraband    Theraband Level (Row) Level 3 (Green)    Other Standing Lumbar Exercises alternating step taps on edge of treadmill x20      Lumbar Exercises: Seated   Other Seated Lumbar Exercises diagonal chops with 5# kettle bell 2x10 each    Other Seated Lumbar Exercises mini chair sit ups 5# 2x10      Knee/Hip Exercises: Aerobic   Nustep seat 9 L2 10 min while discussing status/progress      Knee/Hip Exercises: Machines for Strengthening   Cybex Leg Press 70# bil  3x10, 35# single leg 2x10each      Knee/Hip Exercises: Standing   Heel Raises Both;10 reps;1 set                 Balance Exercises - 09/14/21 0001       Balance Exercises: Standing   Tandem Stance Upper extremity support 1;3 reps;10 secs    Other Standing Exercises box step on discs on floor x 5 laps each direction- gait belt used                  PT Short Term Goals - 08/27/21 1222       PT SHORT TERM GOAL #2   Title The patient will have LE strength needed to rise sit to standing 3x without UE use    Baseline 5x without difficulty               PT Long Term Goals - 09/14/21 1202       PT LONG TERM GOAL #2   Title FOTO score improved from 52% to 66%    Baseline 51- no back pain- mobility and balance    Status Deferred      PT LONG TERM GOAL #3   Title The patient will have improved trunk strength to 4/5 needed for improved tolerance to standing/walking for > 15 minutes needed for community mobility    Baseline can walk 8-10 min and then sits to rest    Time 4    Period Weeks    Status On-going    Target Date 10/12/21      PT LONG TERM GOAL #4   Title The patient will be able to walk 750 feet with cane needed to return to the park    Baseline able to walk in the park 8-11 min    Status Achieved      PT LONG TERM GOAL #5   Title Timed up and Go improved < 12 sec indicating improved gait speed and LE strength    Baseline 10.7 without cane    Status Achieved                   Plan - 09/14/21 1206     Clinical Impression Statement Pt continues to stay active, completing HEP and walking at the park daily.   He demonstrates excellent carryover and compliance with his HEP.  Pt walks 8-11 minutes in the park and then sits to rest before walking more. Pt met  goal for TUG without use of cane, indicating improved balance overall.  Pt with reduced step length and limited hip rotation with all ambulation. PT provided min to mod assistance with  balance tasks and pt fatigued with these tasks as pt is sufficiently challenged with current level of activity in the clinic.vc PT closely monitoring response throughout session.  Pt will be out of town x 2 weeks and will return to PT for 2 sessions to finalize HEP and continue to address balance and endurance. Pt will continue to benefit from continued skilled PT to address endurance, balance and strength to improve safety and independence at home and in the community.    PT Frequency 1x / week    PT Duration 4 weeks    PT Treatment/Interventions ADLs/Self Care Home Management;Aquatic Therapy;Cryotherapy;Electrical Stimulation;Moist Heat;Functional mobility training;Therapeutic activities;Therapeutic exercise;Neuromuscular re-education;Manual techniques;Patient/family education;Taping    PT Next Visit Plan Pt out of town x 2 weeks.  2 more sessions when he returns    PT [place]    Recommended Other Services recert sent 9/41/74    Consulted and Agree with Plan of Care Patient             Patient will benefit from skilled therapeutic intervention in order to improve the following deficits and impairments:  Decreased range of motion, Difficulty walking, Decreased activity tolerance, Pain, Decreased strength  Visit Diagnosis: Bilateral low back pain, unspecified chronicity, unspecified whether sciatica present - Plan: PT plan of care cert/re-cert  Difficulty in walking, not elsewhere classified - Plan: PT plan of care cert/re-cert  Muscle weakness (generalized) - Plan: PT plan of care cert/re-cert     Problem List Patient Active Problem List   Diagnosis Date Noted   Spinal stenosis of lumbar region with neurogenic claudication 07/07/2021   Spinal stenosis, lumbar region, with neurogenic claudication 07/06/2021   Dysfunction of Eustachian tube, bilateral 07/21/2020   Gait disorder 04/20/2016   Hyperglycemia 04/20/2016   Abnormal CT scan, chest 06/03/2014   Erectile  dysfunction 06/03/2014   Spinal stenosis of lumbar region 03/15/2014   Cough 03/12/2013   Allergic rhinitis 03/12/2013   Leg pain, bilateral 12/30/2011   Bladder neck obstruction 12/30/2011   Preventative health care 12/25/2011   Lumbar disc disease 12/25/2011   S/P removal of thyroid nodule 12/25/2011   History of MI (myocardial infarction) 12/25/2011   Right knee meniscal tear 12/25/2011   Degenerative arthritis of hip 12/25/2011   HIP PAIN, RIGHT 05/14/2010   FATIGUE 10/13/2009   HYPERTENSION, BENIGN 02/25/2009   CAD, AUTOLOGOUS BYPASS GRAFT 02/25/2009   CORONARY ARTERY ANEURYSM 02/25/2009   BACK PAIN 04/15/2008   Hyperlipidemia 02/13/2008   CORONARY ARTERY DISEASE 02/13/2008   DIVERTICULOSIS, COLON 02/13/2008   CARDIAC DISEASE, HX OF 02/13/2008   COLONIC POLYPS, HX OF 02/13/2008  Sigurd Sos, PT 09/14/21 12:29 PM   [facility] [address], [place], [place], [address] Phone: [phone]   Fax:  [phone]  Name: CORRION STIREWALT MRN: [id number] Date of Birth: Dec 18, 1932

## 2021-10-02 ENCOUNTER — Other Ambulatory Visit: Payer: Self-pay

## 2021-10-02 ENCOUNTER — Encounter: Payer: Self-pay | Admitting: Internal Medicine

## 2021-10-02 ENCOUNTER — Ambulatory Visit (INDEPENDENT_AMBULATORY_CARE_PROVIDER_SITE_OTHER): Payer: Medicare Other | Admitting: Internal Medicine

## 2021-10-02 VITALS — BP 142/80 | HR 88 | Temp 98.8°F | Ht 67.0 in | Wt 178.0 lb

## 2021-10-02 DIAGNOSIS — E7800 Pure hypercholesterolemia, unspecified: Secondary | ICD-10-CM

## 2021-10-02 DIAGNOSIS — N5201 Erectile dysfunction due to arterial insufficiency: Secondary | ICD-10-CM | POA: Diagnosis not present

## 2021-10-02 DIAGNOSIS — I25810 Atherosclerosis of coronary artery bypass graft(s) without angina pectoris: Secondary | ICD-10-CM

## 2021-10-02 DIAGNOSIS — N401 Enlarged prostate with lower urinary tract symptoms: Secondary | ICD-10-CM | POA: Diagnosis not present

## 2021-10-02 DIAGNOSIS — I10 Essential (primary) hypertension: Secondary | ICD-10-CM

## 2021-10-02 DIAGNOSIS — R739 Hyperglycemia, unspecified: Secondary | ICD-10-CM

## 2021-10-02 DIAGNOSIS — Z23 Encounter for immunization: Secondary | ICD-10-CM

## 2021-10-02 DIAGNOSIS — E538 Deficiency of other specified B group vitamins: Secondary | ICD-10-CM | POA: Diagnosis not present

## 2021-10-02 DIAGNOSIS — E559 Vitamin D deficiency, unspecified: Secondary | ICD-10-CM | POA: Diagnosis not present

## 2021-10-02 DIAGNOSIS — M48062 Spinal stenosis, lumbar region with neurogenic claudication: Secondary | ICD-10-CM

## 2021-10-02 DIAGNOSIS — N281 Cyst of kidney, acquired: Secondary | ICD-10-CM | POA: Diagnosis not present

## 2021-10-02 DIAGNOSIS — R351 Nocturia: Secondary | ICD-10-CM | POA: Diagnosis not present

## 2021-10-02 LAB — HEPATIC FUNCTION PANEL
ALT: 18 U/L (ref 0–53)
AST: 20 U/L (ref 0–37)
Albumin: 4.3 g/dL (ref 3.5–5.2)
Alkaline Phosphatase: 39 U/L (ref 39–117)
Bilirubin, Direct: 0.1 mg/dL (ref 0.0–0.3)
Total Bilirubin: 0.7 mg/dL (ref 0.2–1.2)
Total Protein: 7.3 g/dL (ref 6.0–8.3)

## 2021-10-02 LAB — HEMOGLOBIN A1C: Hgb A1c MFr Bld: 5.6 % (ref 4.6–6.5)

## 2021-10-02 LAB — BASIC METABOLIC PANEL
BUN: 15 mg/dL (ref 6–23)
CO2: 28 mEq/L (ref 19–32)
Calcium: 9.2 mg/dL (ref 8.4–10.5)
Chloride: 105 mEq/L (ref 96–112)
Creatinine, Ser: 1.04 mg/dL (ref 0.40–1.50)
GFR: 64.86 mL/min (ref >60.00)
Glucose, Bld: 107 mg/dL — ABNORMAL HIGH (ref 70–99)
Potassium: 4.3 mEq/L (ref 3.5–5.1)
Sodium: 139 mEq/L (ref 135–145)

## 2021-10-02 LAB — LIPID PANEL
Cholesterol: 121 mg/dL (ref 0–200)
HDL: 33.9 mg/dL — ABNORMAL LOW (ref >39.00)
NonHDL: 86.84
Total CHOL/HDL Ratio: 4
Triglycerides: 260 mg/dL — ABNORMAL HIGH (ref 0.0–149.0)
VLDL: 52 mg/dL — ABNORMAL HIGH (ref 0.0–40.0)

## 2021-10-02 LAB — CBC WITH DIFFERENTIAL/PLATELET
Basophils Absolute: 0.1 10*3/uL (ref 0.0–0.1)
Basophils Relative: 1.2 % (ref 0.0–3.0)
Eosinophils Absolute: 0.1 10*3/uL (ref 0.0–0.7)
Eosinophils Relative: 1.5 % (ref 0.0–5.0)
HCT: 47.4 % (ref 39.0–52.0)
Hemoglobin: 15.7 g/dL (ref 13.0–17.0)
Lymphocytes Relative: 19.5 % (ref 12.0–46.0)
Lymphs Abs: 1.4 10*3/uL (ref 0.7–4.0)
MCHC: 33.3 g/dL (ref 30.0–36.0)
MCV: 91.7 fl (ref 78.0–100.0)
Monocytes Absolute: 0.7 10*3/uL (ref 0.1–1.0)
Monocytes Relative: 10.1 % (ref 3.0–12.0)
Neutro Abs: 4.7 10*3/uL (ref 1.4–7.7)
Neutrophils Relative %: 67.7 % (ref 43.0–77.0)
Platelets: 223 10*3/uL (ref 150.0–400.0)
RBC: 5.17 Mil/uL (ref 4.22–5.81)
RDW: 13.8 % (ref 11.5–15.5)
WBC: 7 10*3/uL (ref 4.0–10.5)

## 2021-10-02 LAB — LDL CHOLESTEROL, DIRECT: Direct LDL: 57 mg/dL

## 2021-10-02 LAB — TSH: TSH: 1.31 u[IU]/mL (ref 0.35–5.50)

## 2021-10-02 LAB — VITAMIN B12: Vitamin B-12: 542 pg/mL (ref 211–911)

## 2021-10-02 LAB — VITAMIN D 25 HYDROXY (VIT D DEFICIENCY, FRACTURES): VITD: 41.76 ng/mL (ref 30.00–100.00)

## 2021-10-02 NOTE — Patient Instructions (Signed)

## 2021-10-02 NOTE — Progress Notes (Signed)
Patient ID: Larry [name], male   DOB: 1932/02/05, 87 y.o.   MRN: [id number]        Chief Complaint: follow up HTN, HLD and hyperglycemia , yearly exam       HPI:  Larry [name] is a 87 y.o. male here with wife, overall doing ok,  Pt denies chest pain, increased sob or doe, wheezing, orthopnea, PND, increased LE swelling, palpitations, dizziness or syncope.   Pt denies polydipsia, polyuria, or new focal neuro s/s.   Pt denies fever, wt loss, night sweats, loss of appetite, or other constitutional symptoms      Pt is s/p lumbar laminectomy 12 wks ago, well improved now.  Not having to use the cane in the house, only out of the house.  No falls.  Saw urology earlier today Dr Junious Silk with renal stones - stable today.  Wt Readings from Last 3 Encounters:  10/02/21 178 lb (80.7 kg)  07/06/21 174 lb (78.9 kg)  07/02/21 176 lb 6.4 oz (80 kg)   BP Readings from Last 3 Encounters:  10/02/21 (!) 142/80  07/07/21 124/71  07/02/21 124/79         Past Medical History:  Diagnosis Date   Allergic rhinitis, cause unspecified 03/12/2013   Allergy    CAD (coronary artery disease)    S/P CABG in 1999    Cataract    Decreased exercise tolerance    Exertional fatigue   Degenerative arthritis of hip    Diverticulosis of colon    ED (erectile dysfunction)    Erectile dysfunction 06/03/2014   History of colonic polyps    History of kidney stones    History of MI (myocardial infarction)    Hx of adenomatous colonic polyps 2000   Hyperlipidemia    Low HDL   Lumbar disc disease    Hx of with recent back pain and buttock pain   Myocardial infarction ([place]) 1999   Right knee meniscal tear    S/P removal of thyroid nodule    Spinal stenosis of lumbar region 03/15/2014   Past Surgical History:  Procedure Laterality Date   BACK SURGERY  1960   CARDIAC CATHETERIZATION     CATARACT EXTRACTION     COLONOSCOPY     CORONARY ARTERY BYPASS GRAFT  1999   CYSTOSCOPY/URETEROSCOPY/HOLMIUM LASER/STENT  PLACEMENT Left 12/23/2017   Procedure: CYSTOSCOPY/RETROGRADE/URETEROSCOPY/HOLMIUM LASER/STENT PLACEMENT;  Surgeon: Festus Aloe, MD;  [facility];  Service: Urology;  Laterality: Left;  ONLY NEEDS 60 MIN   EYE SURGERY Bilateral    bilaterl cataract removal   LUMBAR DISC SURGERY     LUMBAR LAMINECTOMY/DECOMPRESSION MICRODISCECTOMY Bilateral 07/06/2021   Procedure: Bilateral Lumbar three-four Laminectomy and foraminotomy;  Surgeon: Kristeen Miss, MD;  Location: [place];  Service: Neurosurgery;  Laterality: Bilateral;   Nodules on Thyroid     POLYPECTOMY     PTCA     SHOULDER ARTHROSCOPY WITH ROTATOR CUFF REPAIR AND SUBACROMIAL DECOMPRESSION Right 01/30/2015   Procedure: RIGHT ARTHROSCOPY SHOULDER SUBACROMIAL DECOMPRESSION DISTAL CLAVICAL RESECTION RETATOR CUFF REPAIR;  Surgeon: Justice Britain, MD;  Location: [place];  Service: Orthopedics;  Laterality: Right;   TONSILLECTOMY     VASECTOMY      reports that he has never smoked. He has never used smokeless tobacco. He reports current alcohol use. He reports that he does not use drugs. family history includes Heart disease in his brother and mother; Prostate cancer in some other family members. Allergies  Allergen Reactions   Beta Adrenergic  Blockers Other (See Comments)    Dropped blood pressure too low   Simvastatin Other (See Comments)    myalgia   Current Outpatient Medications on File Prior to Visit  Medication Sig Dispense Refill   acetaminophen (TYLENOL) 650 MG CR tablet Take 650 mg by mouth every 8 (eight) hours as needed for pain.     calcium-vitamin D (OSCAL WITH D) 500-200 MG-UNIT per tablet Take 1 tablet by mouth 2 (two) times daily.     cetirizine (ZYRTEC) 10 MG tablet Take 10 mg by mouth daily.     clopidogrel (PLAVIX) 75 MG tablet Take 1 tablet (75 mg total) by mouth daily. 90 tablet 3   fish oil-omega-3 fatty acids 1000 MG capsule Take 1 g by mouth 2 (two) times daily.     folic acid (FOLVITE) 505 MCG tablet Take 400 mcg  by mouth daily.     Guaifenesin (MUCINEX MAXIMUM STRENGTH) 1200 MG TB12 Take 1,200 mg by mouth in the morning and at bedtime.     Multiple Vitamin (MULTIVITAMIN WITH MINERALS) TABS tablet Take 1 tablet by mouth daily. Centrum Silver     rosuvastatin (CRESTOR) 10 MG tablet Take 5-10 mg by mouth See admin instructions. Alternating 10 mg with 5 mg every other day     tamsulosin (FLOMAX) 0.4 MG CAPS capsule TAKE 1 CAPSULE EVERY DAY 90 capsule 1   No current facility-administered medications on file prior to visit.        ROS:  All others reviewed and negative.  Objective        PE:  BP (!) 142/80 (BP Location: Right Arm, Patient Position: Sitting, Cuff Size: Large)   Pulse 88   Temp 98.8 F (37.1 C) (Oral)   Ht 5\' 7"  (1.702 m)   Wt 178 lb (80.7 kg)   SpO2 94%   BMI 27.88 kg/m                 Constitutional: Pt appears in NAD               HENT: Head: NCAT.                Right Ear: External ear normal.                 Left Ear: External ear normal.                Eyes: . Pupils are equal, round, and reactive to light. Conjunctivae and EOM are normal               Nose: without d/c or deformity               Neck: Neck supple. Gross normal ROM               Cardiovascular: Normal rate and regular rhythm.                 Pulmonary/Chest: Effort normal and breath sounds without rales or wheezing.                Abd:  Soft, NT, ND, + BS, no organomegaly               Neurological: Pt is alert. At baseline orientation, motor grossly intact               Skin: Skin is warm. No rashes, no other new lesions, LE edema - none  Psychiatric: Pt behavior is normal without agitation   Micro: none  Cardiac tracings I have personally interpreted today:  none  Pertinent Radiological findings (summarize): none   Lab Results  Component Value Date   WBC 7.0 10/02/2021   HGB 15.7 10/02/2021   HCT 47.4 10/02/2021   PLT 223.0 10/02/2021   GLUCOSE 107 (H) 10/02/2021   CHOL 121  10/02/2021   TRIG 260.0 (H) 10/02/2021   HDL 33.90 (L) 10/02/2021   LDLDIRECT 57.0 10/02/2021   LDLCALC 41 09/07/2019   ALT 18 10/02/2021   AST 20 10/02/2021   NA 139 10/02/2021   K 4.3 10/02/2021   CL 105 10/02/2021   CREATININE 1.04 10/02/2021   BUN 15 10/02/2021   CO2 28 10/02/2021   TSH 1.31 10/02/2021   PSA 1.40 03/20/2015   INR 0.99 12/19/2017   HGBA1C 5.6 10/02/2021   Assessment/Plan:  Larry [name] is a 88 y.o. White or Caucasian [1] male with  has a past medical history of Allergic rhinitis, cause unspecified (03/12/2013), Allergy, CAD (coronary artery disease), Cataract, Decreased exercise tolerance, Degenerative arthritis of hip, Diverticulosis of colon, ED (erectile dysfunction), Erectile dysfunction (06/03/2014), History of colonic polyps, History of kidney stones, History of MI (myocardial infarction), adenomatous colonic polyps (2000), Hyperlipidemia, Lumbar disc disease, Myocardial infarction ([place]) (1999), Right knee meniscal tear, S/P removal of thyroid nodule, and Spinal stenosis of lumbar region (03/15/2014).  Hyperlipidemia Lab Results  Component Value Date   LDLCALC 41 09/07/2019   Stable, pt to continue current statin crestor 10   HYPERTENSION, BENIGN BP Readings from Last 3 Encounters:  10/02/21 (!) 142/80  07/07/21 124/71  07/02/21 124/79   Mild uncontrolled today, goal < 140/90, possibly situational, pt to continue medical treatment  - diet, wt control, decliens in med tx for this, and will f/u BP at home and next visit   Hyperglycemia Lab Results  Component Value Date   HGBA1C 5.6 10/02/2021   Stable, pt to continue current medical treatment  - diet   Spinal stenosis of lumbar region with neurogenic claudication Ambulating well today with cane, cont PT  Followup: Return in about 6 months (around 04/02/2022).  Cathlean Cower, MD 10/04/2021 7:35 PM [facility]

## 2021-10-04 ENCOUNTER — Encounter: Payer: Self-pay | Admitting: Internal Medicine

## 2021-10-04 NOTE — Assessment & Plan Note (Signed)
Ambulating well today with cane, cont PT

## 2021-10-04 NOTE — Assessment & Plan Note (Signed)
Lab Results  Component Value Date   LDLCALC 41 09/07/2019   Stable, pt to continue current statin crestor 10

## 2021-10-04 NOTE — Assessment & Plan Note (Signed)
Lab Results  Component Value Date   HGBA1C 5.6 10/02/2021   Stable, pt to continue current medical treatment  - diet

## 2021-10-04 NOTE — Assessment & Plan Note (Signed)
BP Readings from Last 3 Encounters:  10/02/21 (!) 142/80  07/07/21 124/71  07/02/21 124/79   Mild uncontrolled today, goal < 140/90, possibly situational, pt to continue medical treatment  - diet, wt control, decliens in med tx for this, and will f/u BP at home and next visit

## 2021-10-05 ENCOUNTER — Other Ambulatory Visit: Payer: Self-pay

## 2021-10-05 ENCOUNTER — Ambulatory Visit: Payer: Medicare Other | Attending: Internal Medicine

## 2021-10-05 DIAGNOSIS — R262 Difficulty in walking, not elsewhere classified: Secondary | ICD-10-CM | POA: Insufficient documentation

## 2021-10-05 DIAGNOSIS — M545 Low back pain, unspecified: Secondary | ICD-10-CM | POA: Insufficient documentation

## 2021-10-05 DIAGNOSIS — R296 Repeated falls: Secondary | ICD-10-CM | POA: Insufficient documentation

## 2021-10-05 DIAGNOSIS — M6281 Muscle weakness (generalized): Secondary | ICD-10-CM | POA: Diagnosis not present

## 2021-10-05 DIAGNOSIS — R2681 Unsteadiness on feet: Secondary | ICD-10-CM | POA: Insufficient documentation

## 2021-10-05 NOTE — Therapy (Signed)
[facility] @ [place], [place], [address] Phone: [phone]   Fax:  [phone]  Physical Therapy Treatment  Patient Details  Name: Larry [name] MRN: [id number] Date of Birth: Jul 26, 1932 Referring Provider (PT): Dr. Ellene Route   Encounter Date: 10/05/2021   PT End of Session - 10/05/21 1225     Visit Number 12    Date for PT Re-Evaluation 10/12/21    Authorization Type Medicare    PT Start Time 1147    PT Stop Time 1228    PT Time Calculation (min) 41 min    Activity Tolerance Patient tolerated treatment well    Behavior During Therapy [facility] for tasks assessed/performed             Past Medical History:  Diagnosis Date   Allergic rhinitis, cause unspecified 03/12/2013   Allergy    CAD (coronary artery disease)    S/P CABG in 1999    Cataract    Decreased exercise tolerance    Exertional fatigue   Degenerative arthritis of hip    Diverticulosis of colon    ED (erectile dysfunction)    Erectile dysfunction 06/03/2014   History of colonic polyps    History of kidney stones    History of MI (myocardial infarction)    Hx of adenomatous colonic polyps 2000   Hyperlipidemia    Low HDL   Lumbar disc disease    Hx of with recent back pain and buttock pain   Myocardial infarction ([place]) 1999   Right knee meniscal tear    S/P removal of thyroid nodule    Spinal stenosis of lumbar region 03/15/2014    Past Surgical History:  Procedure Laterality Date   [place]   CYSTOSCOPY/URETEROSCOPY/HOLMIUM LASER/STENT PLACEMENT Left 12/23/2017   Procedure: CYSTOSCOPY/RETROGRADE/URETEROSCOPY/HOLMIUM LASER/STENT PLACEMENT;  Surgeon: Festus Aloe, MD;  Location: [facility];  Service: Urology;  Laterality: Left;  ONLY NEEDS 60 MIN   EYE SURGERY Bilateral    bilaterl cataract removal   LUMBAR  DISC SURGERY     LUMBAR LAMINECTOMY/DECOMPRESSION MICRODISCECTOMY Bilateral 07/06/2021   Procedure: Bilateral Lumbar three-four Laminectomy and foraminotomy;  Surgeon: Kristeen Miss, MD;  Location: [facility];  Service: Neurosurgery;  Laterality: Bilateral;   Nodules on Thyroid     POLYPECTOMY     PTCA     SHOULDER ARTHROSCOPY WITH ROTATOR CUFF REPAIR AND SUBACROMIAL DECOMPRESSION Right 01/30/2015   Procedure: RIGHT ARTHROSCOPY SHOULDER SUBACROMIAL DECOMPRESSION DISTAL CLAVICAL RESECTION RETATOR CUFF REPAIR;  Surgeon: Justice Britain, MD;  Location: [facility];  Service: Orthopedics;  Laterality: Right;   TONSILLECTOMY     VASECTOMY      There were no vitals filed for this visit.   Subjective Assessment - 10/05/21 1149     Subjective I had a nice trip to [place].  I walked daily.    Patient Stated Goals get endurance up, stand longer, walk further    Currently in Pain? No/denies                               [facility] Adult PT Treatment/Exercise - 10/05/21 0001       Lumbar Exercises: Stretches   Other Lumbar Stretch Exercise 2nd step hip flexor stretch      Lumbar Exercises:  Standing   Other Standing Lumbar Exercises alternating step taps on edge of treadmill x20      Lumbar Exercises: Seated   Other Seated Lumbar Exercises diagonal chops with 5# kettle bell 2x10 each    Other Seated Lumbar Exercises mini chair sit ups 5# 2x10      Knee/Hip Exercises: Aerobic   Nustep seat 9 L2 10 min while discussing status/progress      Knee/Hip Exercises: Machines for Strengthening   Cybex Leg Press 70# bil 3x10, 35# single leg 2x10each                 Balance Exercises - 10/05/21 0001       Balance Exercises: Standing   Tandem Stance Upper extremity support 1;3 reps;10 secs    Other Standing Exercises box step on discs on floor x 5 laps each direction- gait belt used                  PT Short Term Goals - 08/27/21 1222       PT SHORT TERM GOAL #2   Title  The patient will have LE strength needed to rise sit to standing 3x without UE use    Baseline 5x without difficulty               PT Long Term Goals - 09/14/21 1202       PT LONG TERM GOAL #2   Title FOTO score improved from 52% to 66%    Baseline 51- no back pain- mobility and balance    Status Deferred      PT LONG TERM GOAL #3   Title The patient will have improved trunk strength to 4/5 needed for improved tolerance to standing/walking for > 15 minutes needed for community mobility    Baseline can walk 8-10 min and then sits to rest    Time 4    Period Weeks    Status On-going    Target Date 10/12/21      PT LONG TERM GOAL #4   Title The patient will be able to walk 750 feet with cane needed to return to the park    Baseline able to walk in the park 8-11 min    Status Achieved      PT LONG TERM GOAL #5   Title Timed up and Go improved < 12 sec indicating improved gait speed and LE strength    Baseline 10.7 without cane    Status Achieved                   Plan - 10/05/21 1204     Clinical Impression Statement Pt had lapse in treatment due to travel. Pt continues to stay active, completing HEP and walking at the park daily.  Pt walks 8-11 minutes in the park and then sits to rest before walking more. PT provided min to mod assistance with balance tasks and pt fatigued with these tasks as pt is sufficiently challenged with current level of activity in the clinic.vc PT closely monitoring response throughout session. Pt will attend 1 more session, balance and strength to improve safety and independence at home and in the community.    PT Frequency 1x / week    PT Duration 4 weeks    PT Treatment/Interventions ADLs/Self Care Home Management;Aquatic Therapy;Cryotherapy;Electrical Stimulation;Moist Heat;Functional mobility training;Therapeutic activities;Therapeutic exercise;Neuromuscular re-education;Manual techniques;Patient/family education;Taping    PT Next Visit  Plan 1 more session.  Finalize HEP, work on balance and  strength.             Patient will benefit from skilled therapeutic intervention in order to improve the following deficits and impairments:  Decreased range of motion, Difficulty walking, Decreased activity tolerance, Pain, Decreased strength  Visit Diagnosis: Bilateral low back pain, unspecified chronicity, unspecified whether sciatica present  Difficulty in walking, not elsewhere classified  Muscle weakness (generalized)  Unsteadiness on feet     Problem List Patient Active Problem List   Diagnosis Date Noted   Spinal stenosis of lumbar region with neurogenic claudication 07/07/2021   Spinal stenosis, lumbar region, with neurogenic claudication 07/06/2021   Dysfunction of Eustachian tube, bilateral 07/21/2020   Gait disorder 04/20/2016   Hyperglycemia 04/20/2016   Abnormal CT scan, chest 06/03/2014   Erectile dysfunction 06/03/2014   Spinal stenosis of lumbar region 03/15/2014   Cough 03/12/2013   Allergic rhinitis 03/12/2013   Leg pain, bilateral 12/30/2011   Bladder neck obstruction 12/30/2011   Preventative health care 12/25/2011   Lumbar disc disease 12/25/2011   S/P removal of thyroid nodule 12/25/2011   History of MI (myocardial infarction) 12/25/2011   Right knee meniscal tear 12/25/2011   Degenerative arthritis of hip 12/25/2011   HIP PAIN, RIGHT 05/14/2010   FATIGUE 10/13/2009   HYPERTENSION, BENIGN 02/25/2009   CAD, AUTOLOGOUS BYPASS GRAFT 02/25/2009   CORONARY ARTERY ANEURYSM 02/25/2009   BACK PAIN 04/15/2008   Hyperlipidemia 02/13/2008   CORONARY ARTERY DISEASE 02/13/2008   DIVERTICULOSIS, COLON 02/13/2008   CARDIAC DISEASE, HX OF 02/13/2008   COLONIC POLYPS, HX OF 02/13/2008   Sigurd Sos, PT 10/05/21 12:27 PM   [facility] @ [place], [place], [address] Phone: [phone]   Fax:  [phone]  Name: DAIEL STROHECKER MRN: [id number] Date of Birth: 1932-10-28

## 2021-10-12 ENCOUNTER — Ambulatory Visit: Payer: Medicare Other

## 2021-10-12 ENCOUNTER — Other Ambulatory Visit: Payer: Self-pay

## 2021-10-12 DIAGNOSIS — R296 Repeated falls: Secondary | ICD-10-CM | POA: Diagnosis not present

## 2021-10-12 DIAGNOSIS — R2681 Unsteadiness on feet: Secondary | ICD-10-CM | POA: Diagnosis not present

## 2021-10-12 DIAGNOSIS — R262 Difficulty in walking, not elsewhere classified: Secondary | ICD-10-CM

## 2021-10-12 DIAGNOSIS — M545 Low back pain, unspecified: Secondary | ICD-10-CM | POA: Diagnosis not present

## 2021-10-12 DIAGNOSIS — M6281 Muscle weakness (generalized): Secondary | ICD-10-CM

## 2021-10-12 NOTE — Therapy (Signed)
[facility] @ [place], [place], [address] Phone: [phone]   Fax:  [phone]  Physical Therapy Treatment  Patient Details  Name: Larry [name] MRN: [id number] Date of Birth: [birth year] Referring Provider (PT): Dr. Ellene Route   Encounter Date: 10/12/2021   PT End of Session - 10/12/21 1223     Visit Number 13    Authorization Type Medicare    PT Start Time 1147    PT Stop Time 1222    PT Time Calculation (min) 35 min    Activity Tolerance Patient tolerated treatment well    Behavior During Therapy [facility] for tasks assessed/performed             Past Medical History:  Diagnosis Date   Allergic rhinitis, cause unspecified 03/12/2013   Allergy    CAD (coronary artery disease)    S/P CABG in 1999    Cataract    Decreased exercise tolerance    Exertional fatigue   Degenerative arthritis of hip    Diverticulosis of colon    ED (erectile dysfunction)    Erectile dysfunction 06/03/2014   History of colonic polyps    History of kidney stones    History of MI (myocardial infarction)    Hx of adenomatous colonic polyps 2000   Hyperlipidemia    Low HDL   Lumbar disc disease    Hx of with recent back pain and buttock pain   Myocardial infarction [facility]) 1999   Right knee meniscal tear    S/P removal of thyroid nodule    Spinal stenosis of lumbar region 03/15/2014    Past Surgical History:  Procedure Laterality Date   [place]   CYSTOSCOPY/URETEROSCOPY/HOLMIUM LASER/STENT PLACEMENT Left 12/23/2017   Procedure: CYSTOSCOPY/RETROGRADE/URETEROSCOPY/HOLMIUM LASER/STENT PLACEMENT;  Surgeon: Festus Aloe, MD;  Location: [facility];  Service: Urology;  Laterality: Left;  ONLY NEEDS 60 MIN   EYE SURGERY Bilateral    bilaterl cataract removal   LUMBAR DISC SURGERY     LUMBAR  LAMINECTOMY/DECOMPRESSION MICRODISCECTOMY Bilateral 07/06/2021   Procedure: Bilateral Lumbar three-four Laminectomy and foraminotomy;  Surgeon: Kristeen Miss, MD;  Location: [facility];  Service: Neurosurgery;  Laterality: Bilateral;   Nodules on Thyroid     POLYPECTOMY     PTCA     SHOULDER ARTHROSCOPY WITH ROTATOR CUFF REPAIR AND SUBACROMIAL DECOMPRESSION Right 01/30/2015   Procedure: RIGHT ARTHROSCOPY SHOULDER SUBACROMIAL DECOMPRESSION DISTAL CLAVICAL RESECTION RETATOR CUFF REPAIR;  Surgeon: Justice Britain, MD;  Location: [facility];  Service: Orthopedics;  Laterality: Right;   TONSILLECTOMY     VASECTOMY      There were no vitals filed for this visit.   Subjective Assessment - 10/12/21 1152     Subjective I'm ready to discharge to exercises.  I am walking and exercising regularly.                [facility] PT Assessment - 10/12/21 0001       Assessment   Medical Diagnosis bil L3-4 lami for stenosis    Referring Provider (PT) Dr. Ellene Route    Onset Date/Surgical Date 07/06/21      Observation/Other Assessments   Focus on Therapeutic Outcomes (FOTO)  51      Timed Up and Go Test   Normal TUG (seconds) 10.7    TUG Comments without  cane                           [facility] Adult PT Treatment/Exercise - 10/12/21 0001       Lumbar Exercises: Stretches   Other Lumbar Stretch Exercise 2nd step hip flexor stretch      Lumbar Exercises: Standing   Other Standing Lumbar Exercises alternating step taps on edge of treadmill x20      Lumbar Exercises: Seated   Other Seated Lumbar Exercises diagonal chops with 5# kettle bell 2x10 each    Other Seated Lumbar Exercises mini chair sit ups 5# 2x10      Knee/Hip Exercises: Aerobic   Nustep seat 9 L2 10 min while discussing status/progress      Knee/Hip Exercises: Machines for Strengthening   Cybex Leg Press 70# bil 3x10, 35# single leg 2x10each      Knee/Hip Exercises: Standing   Heel Raises Both;10 reps;1 set                  Balance Exercises - 10/12/21 0001       Balance Exercises: Standing   Other Standing Exercises box step on discs on floor x 5 laps each direction- gait belt used                  PT Short Term Goals - 08/27/21 1222       PT SHORT TERM GOAL #2   Title The patient will have LE strength needed to rise sit to standing 3x without UE use    Baseline 5x without difficulty               PT Long Term Goals - 10/12/21 1154       PT LONG TERM GOAL #1   Title Pt will demonstrate independence with final HEP    Status Achieved      PT LONG TERM GOAL #2   Title FOTO score improved from 52% to 66%    Baseline 51- no back pain- mobility and balance    Status Deferred      PT LONG TERM GOAL #3   Title The patient will have improved trunk strength to 4/5 needed for improved tolerance to standing/walking for > 15 minutes needed for community mobility    Baseline can walk 8-10 min and then sits to rest- this is baseline for pt    Status Partially Met      PT LONG TERM GOAL #4   Title The patient will be able to walk 750 feet with cane needed to return to the park    Status Achieved      PT LONG TERM GOAL #5   Title Timed up and Go improved < 12 sec indicating improved gait speed and LE strength    Status Achieved                   Plan - 10/12/21 1210     Clinical Impression Statement Pt is ready to D/C to HEP. Pt exercises regularly and walks for exercise.   Pt with shortened step length and forward trunk posture that is baseline for this patient.  Pt walks 8-10 at the park and then rests before walking some more.  Pt required stand by assistance and min guard for safety and cueing with exercise. Pt will D/C today to HEP and follow-up with MD as needed.    PT Next Visit Plan D/C PT to  HEP    PT Home Exercise Plan [id number]    Consulted and Agree with Plan of Care Patient             Patient will benefit from skilled therapeutic intervention in  order to improve the following deficits and impairments:     Visit Diagnosis: Difficulty in walking, not elsewhere classified  Bilateral low back pain, unspecified chronicity, unspecified whether sciatica present  Muscle weakness (generalized)  Unsteadiness on feet  Repeated falls     Problem List Patient Active Problem List   Diagnosis Date Noted   Spinal stenosis of lumbar region with neurogenic claudication 07/07/2021   Spinal stenosis, lumbar region, with neurogenic claudication 07/06/2021   Dysfunction of Eustachian tube, bilateral 07/21/2020   Gait disorder 04/20/2016   Hyperglycemia 04/20/2016   Abnormal CT scan, chest 06/03/2014   Erectile dysfunction 06/03/2014   Spinal stenosis of lumbar region 03/15/2014   Cough 03/12/2013   Allergic rhinitis 03/12/2013   Leg pain, bilateral 12/30/2011   Bladder neck obstruction 12/30/2011   Preventative health care 12/25/2011   Lumbar disc disease 12/25/2011   S/P removal of thyroid nodule 12/25/2011   History of MI (myocardial infarction) 12/25/2011   Right knee meniscal tear 12/25/2011   Degenerative arthritis of hip 12/25/2011   HIP PAIN, RIGHT 05/14/2010   FATIGUE 10/13/2009   HYPERTENSION, BENIGN 02/25/2009   CAD, AUTOLOGOUS BYPASS GRAFT 02/25/2009   CORONARY ARTERY ANEURYSM 02/25/2009   BACK PAIN 04/15/2008   Hyperlipidemia 02/13/2008   CORONARY ARTERY DISEASE 02/13/2008   DIVERTICULOSIS, COLON 02/13/2008   CARDIAC DISEASE, HX OF 02/13/2008   COLONIC POLYPS, HX OF 02/13/2008   PHYSICAL THERAPY DISCHARGE SUMMARY  Visits from Start of Care: 13  Current functional level related to goals / functional outcomes: See above for current status.    Remaining deficits: Pt has return to baseline post surgery.  He will continue to exercise and walk regularly for strength and endurance gains.   Education / Equipment: HEP   Patient agrees to discharge. Patient goals were partially met. Patient is being discharged due  to being pleased with the current functional level.   Sigurd Sos, PT 10/12/21 12:26 PM   [facility] @ [place], [place], [address] Phone: [phone]   Fax:  [phone]  Name: Larry [name] MRN: [id number] Date of Birth: July 30, 1932

## 2022-02-16 ENCOUNTER — Ambulatory Visit (INDEPENDENT_AMBULATORY_CARE_PROVIDER_SITE_OTHER): Payer: Medicare Other

## 2022-02-16 DIAGNOSIS — Z0000 Encounter for general adult medical examination without abnormal findings: Secondary | ICD-10-CM | POA: Diagnosis not present

## 2022-02-16 NOTE — Progress Notes (Signed)
I connected with Larry [name] today by telephone and verified that I am speaking with the correct person using two identifiers. Location patient: home Location provider: work Persons participating in the virtual visit: patient, provider.   I discussed the limitations, risks, security and privacy concerns of performing an evaluation and management service by telephone and the availability of in person appointments. I also discussed with the patient that there may be a patient responsible charge related to this service. The patient expressed understanding and verbally consented to this telephonic visit.    Interactive audio and video telecommunications were attempted between this provider and patient, however failed, due to patient having technical difficulties OR patient did not have access to video capability.  We continued and completed visit with audio only.  Some vital signs may be absent or patient reported.   Time Spent with patient on telephone encounter: 40 minutes  Subjective:   Larry [name] is a 88 y.o. male who presents for Medicare Annual/Subsequent preventive examination.  Review of Systems     Cardiac Risk Factors include: advanced age (>51men, >48 women);dyslipidemia;family history of premature cardiovascular disease;male gender;hypertension     Objective:    There were no vitals filed for this visit. There is no height or weight on file to calculate BMI.  Advanced Directives 02/16/2022 08/04/2021 07/06/2021 07/02/2021 01/28/2021 08/11/2020 09/05/2018  Does Patient Have a Medical Advance Directive? Yes Yes No No Yes Yes Yes  Type of Paramedic of [place];Living will Living will;Healthcare Power of [place];Living will [place];Living will  Does patient want to make changes to medical advance directive? No - Patient declined - - - No - Patient declined - -  Copy of [place] in  Chart? - Yes - validated most recent copy scanned in chart (See row information) - - - - No - copy requested  Would patient like information on creating a medical advance directive? - - No - Patient declined No - Patient declined - - -    Current Medications (verified) Outpatient Encounter Medications as of 02/16/2022  Medication Sig   acetaminophen (TYLENOL) 650 MG CR tablet Take 650 mg by mouth every 8 (eight) hours as needed for pain.   calcium-vitamin D (OSCAL WITH D) 500-200 MG-UNIT per tablet Take 1 tablet by mouth 2 (two) times daily.   cetirizine (ZYRTEC) 10 MG tablet Take 10 mg by mouth daily.   clopidogrel (PLAVIX) 75 MG tablet Take 1 tablet (75 mg total) by mouth daily.   fish oil-omega-3 fatty acids 1000 MG capsule Take 1 g by mouth 2 (two) times daily.   folic acid (FOLVITE) 263 MCG tablet Take 400 mcg by mouth daily.   Guaifenesin (MUCINEX MAXIMUM STRENGTH) 1200 MG TB12 Take 1,200 mg by mouth in the morning and at bedtime.   Multiple Vitamin (MULTIVITAMIN WITH MINERALS) TABS tablet Take 1 tablet by mouth daily. Centrum Silver   rosuvastatin (CRESTOR) 10 MG tablet Take 5-10 mg by mouth See admin instructions. Alternating 10 mg with 5 mg every other day   tamsulosin (FLOMAX) 0.4 MG CAPS capsule TAKE 1 CAPSULE EVERY DAY   No facility-administered encounter medications on file as of 02/16/2022.    Allergies (verified) Beta adrenergic blockers and Simvastatin   History: Past Medical History:  Diagnosis Date   Allergic rhinitis, cause unspecified 03/12/2013   Allergy    CAD (coronary artery disease)    S/P CABG in 1999  Cataract    Decreased exercise tolerance    Exertional fatigue   Degenerative arthritis of hip    Diverticulosis of colon    ED (erectile dysfunction)    Erectile dysfunction 06/03/2014   History of colonic polyps    History of kidney stones    History of MI (myocardial infarction)    Hx of adenomatous colonic polyps 2000   Hyperlipidemia    Low HDL    Lumbar disc disease    Hx of with recent back pain and buttock pain   Myocardial infarction [facility]) 1999   Right knee meniscal tear    S/P removal of thyroid nodule    Spinal stenosis of lumbar region 03/15/2014   Past Surgical History:  Procedure Laterality Date   BACK SURGERY  1960   CARDIAC CATHETERIZATION     CATARACT EXTRACTION     COLONOSCOPY     CORONARY ARTERY BYPASS GRAFT  1999   CYSTOSCOPY/URETEROSCOPY/HOLMIUM LASER/STENT PLACEMENT Left 12/23/2017   Procedure: CYSTOSCOPY/RETROGRADE/URETEROSCOPY/HOLMIUM LASER/STENT PLACEMENT;  Surgeon: Festus Aloe, MD;  Location: [facility];  Service: Urology;  Laterality: Left;  ONLY NEEDS 60 MIN   EYE SURGERY Bilateral    bilaterl cataract removal   LUMBAR DISC SURGERY     LUMBAR LAMINECTOMY/DECOMPRESSION MICRODISCECTOMY Bilateral 07/06/2021   Procedure: Bilateral Lumbar three-four Laminectomy and foraminotomy;  Surgeon: Kristeen Miss, MD;  Location: [facility];  Service: Neurosurgery;  Laterality: Bilateral;   Nodules on Thyroid     POLYPECTOMY     PTCA     [name] ARTHROSCOPY WITH ROTATOR CUFF REPAIR AND SUBACROMIAL DECOMPRESSION Right 01/30/2015   Procedure: RIGHT ARTHROSCOPY [name] SUBACROMIAL DECOMPRESSION DISTAL CLAVICAL RESECTION RETATOR CUFF REPAIR;  Surgeon: Justice Britain, MD;  Location: [facility];  Service: Orthopedics;  Laterality: Right;   TONSILLECTOMY     VASECTOMY     Family History  Problem Relation Age of Onset   Prostate cancer Other    Prostate cancer Other    Heart disease Mother    Heart disease Brother    Colon cancer Neg Hx    Rectal cancer Neg Hx    Stomach cancer Neg Hx    Social History   Socioeconomic History   Marital status: Married    Spouse name: Not on file   Number of children: 2   Years of education: Not on file   Highest education level: Not on file  Occupational History   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  Tobacco Use   Smoking status: Never   Smokeless tobacco:  Never  Vaping Use   Vaping Use: Never used  Substance and Sexual Activity   Alcohol use: Yes    Alcohol/week: 0.0 standard drinks    Comment: rare   Drug use: No   Sexual activity: Not Currently  Other Topics Concern   Not on file  Social History Narrative   Moved from [facility]   Married      [employer] Release on file   Social Determinants of Health   Financial Resource Strain: Low Risk    Difficulty of Paying Living Expenses: Not hard at all  Food Insecurity: No Food Insecurity   Worried About Charity fundraiser in the Last Year: Never true   Arboriculturist in the Last Year: Never true  Transportation Needs: No Transportation Needs   Lack of Transportation (Medical): No   Lack of Transportation (Non-Medical): No  Physical Activity: Sufficiently Active   Days of Exercise  per Week: 5 days   Minutes of Exercise per Session: 30 min  Stress: No Stress Concern Present   Feeling of Stress : Not at all  Social Connections: Socially Integrated   Frequency of Communication with Friends and Family: More than three times a week   Frequency of Social Gatherings with Friends and Family: Once a week   Attends Religious Services: 1 to 4 times per year   Active Member of [employer] or Organizations: No   Attends Music therapist: 1 to 4 times per year   Marital Status: Married    Tobacco Counseling Counseling given: Not Answered   Clinical Intake:  Pre-visit preparation completed: Yes  Pain : No/denies pain     Nutritional Risks: None Diabetes: No  How often do you need to have someone help you when you read instructions, pamphlets, or other written materials from your doctor or pharmacy?: 1 - Never What is the last grade level you completed in school?: HSG  Diabetic? no  Interpreter Needed?: No  Comments: Lisette Abu, LPN   Activities of Daily Living In your present state of health, do you have any difficulty performing the following  activities: 02/16/2022 07/02/2021  Hearing? N N  Vision? N N  Difficulty concentrating or making decisions? N N  Walking or climbing stairs? N Y  Dressing or bathing? N N  Doing errands, shopping? N N  Preparing Food and eating ? N -  Using the Toilet? N -  In the past six months, have you accidently leaked urine? N -  Do you have problems with loss of bowel control? N -  Managing your Medications? N -  Managing your Finances? N -  Housekeeping or managing your Housekeeping? N -  Some recent data might be hidden    Patient Care Team: Biagio Borg, MD as PCP - General Angelena Form Annita Brod, MD as PCP - Cardiology (Cardiology)  Indicate any recent Medical Services you may have received from other than [facility] providers in the past year (date may be approximate).     Assessment:   This is a routine wellness examination for [place].  Hearing/Vision screen Hearing Screening - Comments:: Patient denied any hearing difficulty.   No hearing aids.  Vision Screening - Comments:: Patient does not wear any corrective lenses/contacts.   Eye exam done by: Dr. Clent Jacks  Dietary issues and exercise activities discussed: Current Exercise Habits: Home exercise routine, Type of exercise: walking, Time (Minutes): 30, Frequency (Times/Week): 5, Weekly Exercise (Minutes/Week): 150, Intensity: Moderate, Exercise limited by: None identified   Goals Addressed   None   Depression Screen PHQ 2/9 Scores 02/16/2022 10/02/2021 01/28/2021 09/30/2020 09/30/2020 09/07/2019 09/05/2018  PHQ - 2 Score 0 0 0 0 0 0 1  PHQ- 9 Score - - - - - - -    Fall Risk Fall Risk  02/16/2022 10/02/2021 01/28/2021 09/30/2020 09/30/2020  Falls in the past year? 0 0 1 0 0  Number falls in past yr: 0 0 0 - 0  Injury with Fall? 0 0 0 - 0  Risk for fall due to : No Fall Risks Impaired balance/gait Impaired balance/gait - No Fall Risks  Follow up Falls evaluation completed - Falls evaluation completed;Education provided - Falls  evaluation completed    FALL RISK PREVENTION PERTAINING TO THE HOME:  Any stairs in or around the home? Yes  If so, are there any without handrails? No  Home free of loose throw rugs in walkways, pet  beds, electrical cords, etc? Yes  Adequate lighting in your home to reduce risk of falls? Yes   ASSISTIVE DEVICES UTILIZED TO PREVENT FALLS:  Life alert? No  Use of a cane, walker or w/c? Yes  Grab bars in the bathroom? Yes  Shower chair or bench in shower? Yes  Elevated toilet seat or a handicapped toilet? No   TIMED UP AND GO:  Was the test performed? No .  Length of time to ambulate 10 feet: n/a sec.   Gait steady and fast with assistive device  Cognitive Function: Normal cognitive status assessed by direct observation by this Nurse Health Advisor. No abnormalities found.   MMSE - Mini Mental State Exam 09/05/2018 04/20/2016 04/20/2016  Not completed: - (No Data) (No Data)  Orientation to time 5 - -  Orientation to Place 5 - -  Registration 3 - -  Attention/ Calculation 4 - -  Recall 2 - -  Language- name 2 objects 2 - -  Language- repeat 1 - -  Language- follow 3 step command 3 - -  Language- read & follow direction 1 - -  Write a sentence 1 - -  Copy design 1 - -  Total score 28 - -        Immunizations Immunization History  Administered Date(s) Administered   Fluad Quad(high Dose 65+) 09/07/2019, 09/30/2020, 10/02/2021   Influenza Split 10/01/2011, 09/06/2012   Influenza Whole 09/20/2007, 10/04/2008, 10/13/2009, 09/07/2010   Influenza, High Dose Seasonal PF 08/12/2017, 09/01/2018   Influenza,inj,Quad PF,6+ Mos 09/26/2013, 09/19/2014   Influenza-Unspecified 12/02/1999, 10/15/2015   PFIZER(Purple Top)SARS-COV-2 Vaccination 01/25/2020, 02/15/2020, 09/27/2020, 03/23/2021, 09/03/2021   Pneumococcal Conjugate-13 03/15/2014   Pneumococcal Polysaccharide-23 01/20/2007, 01/28/2021   Td 01/20/2002   Tetanus 03/12/2013   Zoster Recombinat (Shingrix) 01/30/2019, 07/03/2019     TDAP status: Up to date  Flu Vaccine status: Up to date  Pneumococcal vaccine status: Up to date  Covid-19 vaccine status: Completed vaccines  Qualifies for Shingles Vaccine? Yes   Zostavax completed Yes   Shingrix Completed?: Yes  Screening Tests Health Maintenance  Topic Date Due   COVID-19 Vaccine (6 - Booster for Pfizer series) 10/29/2021   TETANUS/TDAP  03/13/2023   Pneumonia Vaccine 69+ Years old  Completed   INFLUENZA VACCINE  Completed   Zoster Vaccines- Shingrix  Completed   HPV VACCINES  Aged Out    Health Maintenance  Health Maintenance Due  Topic Date Due   COVID-19 Vaccine (6 - Booster for [place] series) 10/29/2021    Colorectal cancer screening: No longer required.   Lung Cancer Screening: (Low Dose CT Chest recommended if Age 53-80 years, 30 pack-year currently smoking OR have quit w/in 15years.) does not qualify.   Lung Cancer Screening Referral: no  Additional Screening:  Hepatitis C Screening: does not qualify; Completed no  Vision Screening: Recommended annual ophthalmology exams for early detection of glaucoma and other disorders of the eye. Is the patient up to date with their annual eye exam?  Yes  Who is the provider or what is the name of the office in which the patient attends annual eye exams? Clent Jacks, MD. If pt is not established with a provider, would they like to be referred to a provider to establish care? No .   Dental Screening: Recommended annual dental exams for proper oral hygiene  Community Resource Referral / Chronic Care Management: CRR required this visit?  No   CCM required this visit?  No      Plan:  I have personally reviewed and noted the following in the patients chart:   Medical and social history Use of alcohol, tobacco or illicit drugs  Current medications and supplements including opioid prescriptions. Patient is not currently taking opioid prescriptions. Functional ability and  status Nutritional status Physical activity Advanced directives List of other physicians Hospitalizations, surgeries, and ER visits in previous 12 months Vitals Screenings to include cognitive, depression, and falls Referrals and appointments  In addition, I have reviewed and discussed with patient certain preventive protocols, quality metrics, and best practice recommendations. A written personalized care plan for preventive services as well as general preventive health recommendations were provided to patient.     Sheral Flow, LPN   [id number]   Nurse Notes:  Patient is cogitatively intact. There were no vitals filed for this visit. There is no height or weight on file to calculate BMI.

## 2022-02-16 NOTE — Patient Instructions (Signed)
Larry [name] , Thank you for taking time to come for your Medicare Wellness Visit. I appreciate your ongoing commitment to your health goals. Please review the following plan we discussed and let me know if I can assist you in the future.   Screening recommendations/referrals: Colonoscopy: Not a candidate for screening due to age; last done 12/04/2014 Recommended yearly ophthalmology/optometry visit for glaucoma screening and checkup Recommended yearly dental visit for hygiene and checkup  Vaccinations: Influenza vaccine: 10/02/2021 Pneumococcal vaccine: 01/20/2007, 03/15/2014, 01/28/2021 Tdap vaccine: 03/12/2013; due every 10 years (due 03/13/2023) Shingles vaccine: 01/30/2019, 07/03/2019   Covid-19: 01/25/2020, 02/05/2020, 09/27/2020, 03/23/2021, 09/03/2021  Advanced directives: Yes; documents on file.  Conditions/risks identified: Yes; Client understands the importance of follow-up appointments with providers by attending scheduled visits and discussed goals to eat healthier, increase physical activity 5 times a week for 30 minutes each, exercise the brain by doing stimulating brain exercises (reading, adult coloring, crafting, listening to music, puzzles, etc.), socialize and enjoy life more, get enough sleep at least 8-9 hours average per night and make time for laughter.  Next appointment: Please schedule your next Medicare Wellness Visit with your Nurse Health Advisor in 1 year by calling [phone].  Preventive Care 76 Years and Older, Male Preventive care refers to lifestyle choices and visits with your health care provider that can promote health and wellness. What does preventive care include? A yearly physical exam. This is also called an annual well check. Dental exams once or twice a year. Routine eye exams. Ask your health care provider how often you should have your eyes checked. Personal lifestyle choices, including: Daily care of your teeth and gums. Regular physical activity. Eating  a healthy diet. Avoiding tobacco and drug use. Limiting alcohol use. Practicing safe sex. Taking low doses of aspirin every day. Taking vitamin and mineral supplements as recommended by your health care provider. What happens during an annual well check? The services and screenings done by your health care provider during your annual well check will depend on your age, overall health, lifestyle risk factors, and family history of disease. Counseling  Your health care provider may ask you questions about your: Alcohol use. Tobacco use. Drug use. Emotional well-being. Home and relationship well-being. Sexual activity. Eating habits. History of falls. Memory and ability to understand (cognition). Work and work Statistician. Screening  You may have the following tests or measurements: Height, weight, and BMI. Blood pressure. Lipid and cholesterol levels. These may be checked every 5 years, or more frequently if you are over 63 years old. Skin check. Lung cancer screening. You may have this screening every year starting at age 46 if you have a 30-pack-year history of smoking and currently smoke or have quit within the past 15 years. Fecal occult blood test (FOBT) of the stool. You may have this test every year starting at age 10. Flexible sigmoidoscopy or colonoscopy. You may have a sigmoidoscopy every 5 years or a colonoscopy every 10 years starting at age 6. Prostate cancer screening. Recommendations will vary depending on your family history and other risks. Hepatitis C blood test. Hepatitis B blood test. Sexually transmitted disease (STD) testing. Diabetes screening. This is done by checking your blood sugar (glucose) after you have not eaten for a while (fasting). You may have this done every 1-3 years. Abdominal aortic aneurysm (AAA) screening. You may need this if you are a current or former smoker. Osteoporosis. You may be screened starting at age 101 if you are at high  risk. Talk with your health care provider about your test results, treatment options, and if necessary, the need for more tests. Vaccines  Your health care provider may recommend certain vaccines, such as: Influenza vaccine. This is recommended every year. Tetanus, diphtheria, and acellular pertussis (Tdap, Td) vaccine. You may need a Td booster every 10 years. Zoster vaccine. You may need this after age 83. Pneumococcal 13-valent conjugate (PCV13) vaccine. One dose is recommended after age 25. Pneumococcal polysaccharide (PPSV23) vaccine. One dose is recommended after age 92. Talk to your health care provider about which screenings and vaccines you need and how often you need them. This information is not intended to replace advice given to you by your health care provider. Make sure you discuss any questions you have with your health care provider. Document Released: 01/02/2016 Document Revised: 08/25/2016 Document Reviewed: 10/07/2015 Elsevier Interactive Patient Education  2017 [place] Prevention in the Home Falls can cause injuries. They can happen to people of all ages. There are many things you can do to make your home safe and to help prevent falls. What can I do on the outside of my home? Regularly fix the edges of walkways and driveways and fix any cracks. Remove anything that might make you trip as you walk through a door, such as a raised step or threshold. Trim any bushes or trees on the path to your home. Use bright outdoor lighting. Clear any walking paths of anything that might make someone trip, such as rocks or tools. Regularly check to see if handrails are loose or broken. Make sure that both sides of any steps have handrails. Any raised decks and porches should have guardrails on the edges. Have any leaves, snow, or ice cleared regularly. Use sand or salt on walking paths during winter. Clean up any spills in your garage right away. This includes oil or  grease spills. What can I do in the bathroom? Use night lights. Install grab bars by the toilet and in the tub and shower. Do not use towel bars as grab bars. Use non-skid mats or decals in the tub or shower. If you need to sit down in the shower, use a plastic, non-slip stool. Keep the floor dry. Clean up any water that spills on the floor as soon as it happens. Remove soap buildup in the tub or shower regularly. Attach bath mats securely with double-sided non-slip rug tape. Do not have throw rugs and other things on the floor that can make you trip. What can I do in the bedroom? Use night lights. Make sure that you have a light by your bed that is easy to reach. Do not use any sheets or blankets that are too big for your bed. They should not hang down onto the floor. Have a firm chair that has side arms. You can use this for support while you get dressed. Do not have throw rugs and other things on the floor that can make you trip. What can I do in the kitchen? Clean up any spills right away. Avoid walking on wet floors. Keep items that you use a lot in easy-to-reach places. If you need to reach something above you, use a strong step stool that has a grab bar. Keep electrical cords out of the way. Do not use floor polish or wax that makes floors slippery. If you must use wax, use non-skid floor wax. Do not have throw rugs and other things on the floor that can make  you trip. What can I do with my stairs? Do not leave any items on the stairs. Make sure that there are handrails on both sides of the stairs and use them. Fix handrails that are broken or loose. Make sure that handrails are as long as the stairways. Check any carpeting to make sure that it is firmly attached to the stairs. Fix any carpet that is loose or worn. Avoid having throw rugs at the top or bottom of the stairs. If you do have throw rugs, attach them to the floor with carpet tape. Make sure that you have a light switch  at the top of the stairs and the bottom of the stairs. If you do not have them, ask someone to add them for you. What else can I do to help prevent falls? Wear shoes that: Do not have high heels. Have rubber bottoms. Are comfortable and fit you well. Are closed at the toe. Do not wear sandals. If you use a stepladder: Make sure that it is fully opened. Do not climb a closed stepladder. Make sure that both sides of the stepladder are locked into place. Ask someone to hold it for you, if possible. Clearly mark and make sure that you can see: Any grab bars or handrails. First and last steps. Where the edge of each step is. Use tools that help you move around (mobility aids) if they are needed. These include: Canes. Walkers. Scooters. Crutches. Turn on the lights when you go into a dark area. Replace any light bulbs as soon as they burn out. Set up your furniture so you have a clear path. Avoid moving your furniture around. If any of your floors are uneven, fix them. If there are any pets around you, be aware of where they are. Review your medicines with your doctor. Some medicines can make you feel dizzy. This can increase your chance of falling. Ask your doctor what other things that you can do to help prevent falls. This information is not intended to replace advice given to you by your health care provider. Make sure you discuss any questions you have with your health care provider. Document Released: 10/02/2009 Document Revised: 05/13/2016 Document Reviewed: 01/10/2015 Elsevier Interactive Patient Education  2017 [employer].

## 2022-02-17 ENCOUNTER — Other Ambulatory Visit: Payer: Self-pay | Admitting: Cardiovascular Disease

## 2022-02-18 MED ORDER — ROSUVASTATIN CALCIUM 10 MG PO TABS: 5.0000 mg | ORAL_TABLET | ORAL | 1 refills | Status: DC

## 2022-03-09 ENCOUNTER — Ambulatory Visit (INDEPENDENT_AMBULATORY_CARE_PROVIDER_SITE_OTHER): Payer: Medicare Other | Admitting: Cardiovascular Disease

## 2022-03-09 ENCOUNTER — Encounter: Payer: Self-pay | Admitting: Cardiovascular Disease

## 2022-03-09 ENCOUNTER — Other Ambulatory Visit: Payer: Self-pay

## 2022-03-09 VITALS — BP 118/70 | HR 101 | Ht 67.0 in | Wt 177.4 lb

## 2022-03-09 DIAGNOSIS — I25810 Atherosclerosis of coronary artery bypass graft(s) without angina pectoris: Secondary | ICD-10-CM | POA: Diagnosis not present

## 2022-03-09 DIAGNOSIS — E7800 Pure hypercholesterolemia, unspecified: Secondary | ICD-10-CM | POA: Diagnosis not present

## 2022-03-09 DIAGNOSIS — I493 Ventricular premature depolarization: Secondary | ICD-10-CM

## 2022-03-09 DIAGNOSIS — I7121 Aneurysm of the ascending aorta, without rupture: Secondary | ICD-10-CM

## 2022-03-09 DIAGNOSIS — I351 Nonrheumatic aortic (valve) insufficiency: Secondary | ICD-10-CM | POA: Diagnosis not present

## 2022-03-09 DIAGNOSIS — R0609 Other forms of dyspnea: Secondary | ICD-10-CM | POA: Diagnosis not present

## 2022-03-09 NOTE — Progress Notes (Signed)
? ? ?Chief Complaint  ?Patient presents with  ? Follow-up  ?  CAD  ? ?History of Present Illness: 88 yo male with history of CAD s/p 3V CABG in 1999, HLD and PVCs here for cardiac follow up. He had 3V CABG in 1999 at [facility] in [place]. In 2008 he had a catheterization and was found to have a patent LIMA to LAD and a patent vein graft to the right coronary and no significant obstruction in the circumflex artery. Exercise stress test April 2015 with no ischemia. He has had PVCs noted on cardiac monitor and was started on Cardizem but he did not tolerate the Cardizem due to dizziness so it was stopped. Echo January 2020 with LVEF=50-55%, mild AI and mildly dilated aortic root. Chest CTA May 2021 with stable 4.3-4.4 cm ascending aortic aneurysm with slight enlargement of 4.3 cm at the sinuses of Valsalva.  ? ?He is here today for follow up. The patient denies any chest pain, palpitations, lower extremity edema, orthopnea, PND, dizziness, near syncope or syncope. He notices some dyspnea when climbing stairs.  ? ?Primary Care Physician: Biagio Borg, MD ? ?Past Medical History:  ?Diagnosis Date  ? Allergic rhinitis, cause unspecified 03/12/2013  ? Allergy   ? CAD (coronary artery disease)   ? S/P CABG in 1999   ? Cataract   ? Decreased exercise tolerance   ? Exertional fatigue  ? Degenerative arthritis of hip   ? Diverticulosis of colon   ? ED (erectile dysfunction)   ? Erectile dysfunction 06/03/2014  ? History of colonic polyps   ? History of kidney stones   ? History of MI (myocardial infarction)   ? Hx of adenomatous colonic polyps 2000  ? Hyperlipidemia   ? Low HDL  ? Lumbar disc disease   ? Hx of with recent back pain and buttock pain  ? Myocardial infarction [facility]) 1999  ? Right knee meniscal tear   ? S/P removal of thyroid nodule   ? Spinal stenosis of lumbar region 03/15/2014  ? ? ?Past Surgical History:  ?Procedure Laterality Date  ? [place]  ? CARDIAC CATHETERIZATION    ? CATARACT  EXTRACTION    ? COLONOSCOPY    ? CORONARY ARTERY BYPASS GRAFT  1999  ? CYSTOSCOPY/URETEROSCOPY/HOLMIUM LASER/STENT PLACEMENT Left 12/23/2017  ? Procedure: ZOXWRUEAVW/UJWJXBJYNW/GNFAOZHYQMVH/QIONGEX LASER/STENT PLACEMENT;  Surgeon: Festus Aloe, MD;  Location: [facility];  Service: Urology;  Laterality: Left;  ONLY NEEDS 60 MIN  ? EYE SURGERY Bilateral   ? bilaterl cataract removal  ? LUMBAR DISC SURGERY    ? LUMBAR LAMINECTOMY/DECOMPRESSION MICRODISCECTOMY Bilateral 07/06/2021  ? Procedure: Bilateral Lumbar three-four Laminectomy and foraminotomy;  Surgeon: Kristeen Miss, MD;  Location: [place];  Service: Neurosurgery;  Laterality: Bilateral;  ? Nodules on Thyroid    ? POLYPECTOMY    ? PTCA    ? SHOULDER ARTHROSCOPY WITH ROTATOR CUFF REPAIR AND SUBACROMIAL DECOMPRESSION Right 01/30/2015  ? Procedure: RIGHT ARTHROSCOPY SHOULDER SUBACROMIAL DECOMPRESSION DISTAL CLAVICAL RESECTION RETATOR CUFF REPAIR;  Surgeon: Justice Britain, MD;  Location: [place];  Service: Orthopedics;  Laterality: Right;  ? TONSILLECTOMY    ? VASECTOMY    ? ? ?Current Outpatient Medications  ?Medication Sig Dispense Refill  ? acetaminophen (TYLENOL) 650 MG CR tablet Take 650 mg by mouth every 8 (eight) hours as needed for pain.    ? calcium-vitamin D (OSCAL WITH D) 500-200 MG-UNIT per tablet Take 1 tablet by mouth 2 (two) times daily.    ?  cetirizine (ZYRTEC) 10 MG tablet Take 10 mg by mouth daily.    ? clopidogrel (PLAVIX) 75 MG tablet TAKE 1 TABLET EVERY DAY 90 tablet 1  ? fish oil-omega-3 fatty acids 1000 MG capsule Take 1 g by mouth 2 (two) times daily.    ? folic acid (FOLVITE) 433 MCG tablet Take 400 mcg by mouth daily.    ? Guaifenesin (MUCINEX MAXIMUM STRENGTH) 1200 MG TB12 Take 1,200 mg by mouth in the morning and at bedtime.    ? Multiple Vitamin (MULTIVITAMIN WITH MINERALS) TABS tablet Take 1 tablet by mouth daily. Centrum Silver    ? rosuvastatin (CRESTOR) 10 MG tablet Take 0.5-1 tablets (5-10 mg total) by mouth See admin instructions.  Alternating 10 mg with 5 mg every other day 75 tablet 1  ? tamsulosin (FLOMAX) 0.4 MG CAPS capsule TAKE 1 CAPSULE EVERY DAY 90 capsule 1  ? ?No current facility-administered medications for this visit.  ? ? ?Allergies  ?Allergen Reactions  ? Beta Adrenergic Blockers Other (See Comments)  ?  Dropped blood pressure too low  ? Simvastatin Other (See Comments)  ?  myalgia  ? ? ?Social History  ? ?Socioeconomic History  ? Marital status: Married  ?  Spouse name: Not on file  ? Number of children: 2  ? Years of education: Not on file  ? Highest education level: Not on file  ?Occupational History  ? Occupation: Retired Pharmacologist for [employer]  ?  Employer: OTHER  ?Tobacco Use  ? Smoking status: Never  ? Smokeless tobacco: Never  ?Vaping Use  ? Vaping Use: Never used  ?Substance and Sexual Activity  ? Alcohol use: Yes  ?  Alcohol/week: 0.0 standard drinks  ?  Comment: rare  ? Drug use: No  ? Sexual activity: Not Currently  ?Other Topics Concern  ? Not on file  ?Social History Narrative  ? Moved from [place]  ? Married  ?   ? Designated Electronics engineer on file  ? ?Social Determinants of Health  ? ?Financial Resource Strain: Low Risk   ? Difficulty of Paying Living Expenses: Not hard at all  ?Food Insecurity: No Food Insecurity  ? Worried About Charity fundraiser in the Last Year: Never true  ? Ran Out of Food in the Last Year: Never true  ?Transportation Needs: No Transportation Needs  ? Lack of Transportation (Medical): No  ? Lack of Transportation (Non-Medical): No  ?Physical Activity: Sufficiently Active  ? Days of Exercise per Week: 5 days  ? Minutes of Exercise per Session: 30 min  ?Stress: No Stress Concern Present  ? Feeling of Stress : Not at all  ?Social Connections: Socially Integrated  ? Frequency of Communication with Friends and Family: More than three times a week  ? Frequency of Social Gatherings with Friends and Family: Once a week  ? Attends Religious Services: 1 to 4 times per year  ? Active Member of Clubs  or Organizations: No  ? Attends Archivist Meetings: 1 to 4 times per year  ? Marital Status: Married  ?Intimate Partner Violence: Not At Risk  ? Fear of Current or Ex-Partner: No  ? Emotionally Abused: No  ? Physically Abused: No  ? Sexually Abused: No  ? ? ?Family History  ?Problem Relation Age of Onset  ? Prostate cancer Other   ? Prostate cancer Other   ? Heart disease Mother   ? Heart disease Brother   ? Colon cancer Neg Hx   ?  Rectal cancer Neg Hx   ? Stomach cancer Neg Hx   ? ? ?Review of Systems:  As stated in the HPI and otherwise negative.  ? ?BP 118/70   Pulse (!) 101   Ht '5\' 7"'$  (1.702 m)   Wt 177 lb 6.4 oz (80.5 kg)   SpO2 99%   BMI 27.78 kg/m?  ? ?Physical Examination: ?General: Well developed, well nourished, NAD  ?HEENT: OP clear, mucus membranes moist  ?SKIN: warm, dry. No rashes. ?Neuro: No focal deficits  ?Musculoskeletal: Muscle strength 5/5 all ext  ?Psychiatric: Mood and affect normal  ?Neck: No JVD, no carotid bruits, no thyromegaly, no lymphadenopathy.  ?Lungs:Clear bilaterally, no wheezes, rhonci, crackles ?Cardiovascular: Regular rate and rhythm. No murmurs, gallops or rubs. ?Abdomen:Soft. Bowel sounds present. Non-tender.  ?Extremities: No lower extremity edema. Pulses are 2 + in the bilateral DP/PT. ? ?Echo January 2020: ?- Left ventricle: The cavity size was normal. Wall thickness was ?  increased in a pattern of mild LVH. Systolic function was normal. ?  The estimated ejection fraction was in the range of 50% to 55%. ?  Wall motion was normal; there were no regional wall motion ?  abnormalities. Doppler parameters are consistent with abnormal ?  left ventricular relaxation (grade 1 diastolic dysfunction). ?- Aortic valve: There was mild regurgitation. ?- Aortic root: The aortic root was mildly dilated. ?- Ascending aorta: The ascending aorta was moderately dilated. ?  ?Impressions: ?- Low normal LV systolic function; mild LVH; mild diastolic ?  dysfunction; calcified  aortic valve with mild AI; moderately ?  dilated ascending aorta (suggest CTA or MRA to further assess). ? ?EKG:  EKG is  ordered today ?The ekg ordered today demonstrates sinus tach, LBBB ? ?Recent Labs: ?10/02/2021: AL

## 2022-03-09 NOTE — Patient Instructions (Addendum)
Medication Instructions:  ?No changes ?*If you need a refill on your cardiac medications before your next appointment, please call your pharmacy* ? ? ?Lab Work: ?none ? ? ?Testing/Procedures: ?Your physician has requested that you have an echocardiogram. Echocardiography is a painless test that uses sound waves to create images of your heart. It provides your doctor with information about the size and shape of your heart and how well your heart?s chambers and valves are working. This procedure takes approximately one hour. There are no restrictions for this procedure. ? ? ?Follow-Up: ?At [facility], you and your health needs are our priority.  As part of our continuing mission to provide you with exceptional heart care, we have created designated Provider Care Teams.  These Care Teams include your primary Cardiologist (physician) and Advanced Practice Providers (APPs -  Physician Assistants and Nurse Practitioners) who all work together to provide you with the care you need, when you need it. ? ? ?Your next appointment:   ?12 month(s) ? ?The format for your next appointment:   ?In Person ? ?Provider:   ?Lauree Chandler, MD   ? ? ?Other Instructions ?  ?

## 2022-03-23 ENCOUNTER — Ambulatory Visit (HOSPITAL_COMMUNITY): Payer: Medicare Other | Attending: Cardiology

## 2022-03-23 DIAGNOSIS — I25810 Atherosclerosis of coronary artery bypass graft(s) without angina pectoris: Secondary | ICD-10-CM

## 2022-03-23 DIAGNOSIS — R0609 Other forms of dyspnea: Secondary | ICD-10-CM | POA: Insufficient documentation

## 2022-03-23 DIAGNOSIS — I493 Ventricular premature depolarization: Secondary | ICD-10-CM | POA: Diagnosis not present

## 2022-03-23 DIAGNOSIS — E7800 Pure hypercholesterolemia, unspecified: Secondary | ICD-10-CM | POA: Diagnosis not present

## 2022-03-23 DIAGNOSIS — I351 Nonrheumatic aortic (valve) insufficiency: Secondary | ICD-10-CM

## 2022-03-23 DIAGNOSIS — I7121 Aneurysm of the ascending aorta, without rupture: Secondary | ICD-10-CM | POA: Diagnosis not present

## 2022-03-23 LAB — ECHOCARDIOGRAM COMPLETE
Area-P 1/2: 3.84 cm2
P 1/2 time: 489 msec
S' Lateral: 4.5 cm

## 2022-04-05 ENCOUNTER — Ambulatory Visit (INDEPENDENT_AMBULATORY_CARE_PROVIDER_SITE_OTHER): Payer: Medicare Other | Admitting: Internal Medicine

## 2022-04-05 ENCOUNTER — Encounter: Payer: Self-pay | Admitting: Internal Medicine

## 2022-04-05 VITALS — BP 120/68 | HR 80 | Temp 97.9°F | Ht 67.0 in | Wt 177.0 lb

## 2022-04-05 DIAGNOSIS — E7800 Pure hypercholesterolemia, unspecified: Secondary | ICD-10-CM | POA: Diagnosis not present

## 2022-04-05 DIAGNOSIS — I25810 Atherosclerosis of coronary artery bypass graft(s) without angina pectoris: Secondary | ICD-10-CM | POA: Diagnosis not present

## 2022-04-05 DIAGNOSIS — I10 Essential (primary) hypertension: Secondary | ICD-10-CM | POA: Diagnosis not present

## 2022-04-05 DIAGNOSIS — R739 Hyperglycemia, unspecified: Secondary | ICD-10-CM

## 2022-04-05 DIAGNOSIS — J309 Allergic rhinitis, unspecified: Secondary | ICD-10-CM | POA: Diagnosis not present

## 2022-04-05 NOTE — Progress Notes (Signed)
Patient ID: KEAVON SENSING, male   DOB: [birth year], 88 y.o.   MRN: [id number] ? ? ? ?     Chief Complaint:: yearly exam ? ?     HPI:  Larry [name] is a 88 y.o. male here overall doing ok, Does have several wks ongoing nasal allergy symptoms with clearish congestion, itch and sneezing, without fever, pain, ST, cough, swelling or wheezing.  Has been out of anithistmine.  Pt denies chest pain, increased sob or doe, wheezing, orthopnea, PND, increased LE swelling, palpitations, dizziness or syncope.   Pt denies polydipsia, polyuria, or new focal neuro s/s.    Pt denies fever, wt loss, night sweats, loss of appetite, or other constitutional symptoms  No other complaints ?Wt Readings from Last 3 Encounters:  ?04/05/22 177 lb (80.3 kg)  ?03/09/22 177 lb 6.4 oz (80.5 kg)  ?10/02/21 178 lb (80.7 kg)  ? ?BP Readings from Last 3 Encounters:  ?04/05/22 120/68  ?03/09/22 118/70  ?10/02/21 (!) 142/80  ? ?Immunization History  ?Administered Date(s) Administered  ? Fluad Quad(high Dose 65+) 09/07/2019, 09/30/2020, 10/02/2021  ? Influenza Split 10/01/2011, 09/06/2012  ? Influenza Whole 09/20/2007, 10/04/2008, 10/13/2009, 09/07/2010  ? Influenza, High Dose Seasonal PF 08/12/2017, 09/01/2018  ? Influenza,inj,Quad PF,6+ Mos 09/26/2013, 09/19/2014  ? Influenza-Unspecified 12/02/1999, 10/15/2015  ? PFIZER(Purple Top)SARS-COV-2 Vaccination 01/25/2020, 02/15/2020, 09/27/2020, 03/23/2021, 09/03/2021  ? Pneumococcal Conjugate-13 03/15/2014  ? Pneumococcal Polysaccharide-23 01/20/2007, 01/28/2021  ? Td 01/20/2002  ? Tetanus 03/12/2013  ? Zoster Recombinat (Shingrix) 01/30/2019, 07/03/2019  ? ?Health Maintenance Due  ?Topic Date Due  ? COVID-19 Vaccine (6 - Booster for Pfizer series) 10/29/2021  ? ?  ? ?Past Medical History:  ?Diagnosis Date  ? Allergic rhinitis, cause unspecified 03/12/2013  ? Allergy   ? CAD (coronary artery disease)   ? S/P CABG in 1999   ? Cataract   ? Decreased exercise tolerance   ? Exertional fatigue  ? Degenerative  arthritis of hip   ? Diverticulosis of colon   ? ED (erectile dysfunction)   ? Erectile dysfunction 06/03/2014  ? History of colonic polyps   ? History of kidney stones   ? History of MI (myocardial infarction)   ? Hx of adenomatous colonic polyps 2000  ? Hyperlipidemia   ? Low HDL  ? Lumbar disc disease   ? Hx of with recent back pain and buttock pain  ? Myocardial infarction [facility]) 1999  ? Right knee meniscal tear   ? S/P removal of thyroid nodule   ? Spinal stenosis of lumbar region 03/15/2014  ? ?Past Surgical History:  ?Procedure Laterality Date  ? [place]  ? CARDIAC CATHETERIZATION    ? CATARACT EXTRACTION    ? COLONOSCOPY    ? CORONARY ARTERY BYPASS GRAFT  1999  ? CYSTOSCOPY/URETEROSCOPY/HOLMIUM LASER/STENT PLACEMENT Left 12/23/2017  ? Procedure: NWGNFAOZHY/QMVHQIONGE/XBMWUXLKGMWN/UUVOZDG LASER/STENT PLACEMENT;  Surgeon: Festus Aloe, MD;  [facility];  Service: Urology;  Laterality: Left;  ONLY NEEDS 60 MIN  ? EYE SURGERY Bilateral   ? bilaterl cataract removal  ? LUMBAR DISC SURGERY    ? LUMBAR LAMINECTOMY/DECOMPRESSION MICRODISCECTOMY Bilateral 07/06/2021  ? Procedure: Bilateral Lumbar three-four Laminectomy and foraminotomy;  Surgeon: Kristeen Miss, MD;  Location: [place];  Service: Neurosurgery;  Laterality: Bilateral;  ? Nodules on Thyroid    ? POLYPECTOMY    ? PTCA    ? SHOULDER ARTHROSCOPY WITH ROTATOR CUFF REPAIR AND SUBACROMIAL DECOMPRESSION Right 01/30/2015  ? Procedure: RIGHT ARTHROSCOPY SHOULDER SUBACROMIAL DECOMPRESSION DISTAL CLAVICAL  RESECTION RETATOR CUFF REPAIR;  Surgeon: Justice Britain, MD;  Location: [place];  Service: Orthopedics;  Laterality: Right;  ? TONSILLECTOMY    ? VASECTOMY    ? ? reports that he has never smoked. He has never used smokeless tobacco. He reports current alcohol use. He reports that he does not use drugs. ?family history includes Heart disease in his brother and mother; Prostate cancer in some other family members. ?Allergies  ?Allergen Reactions  ?  Beta Adrenergic Blockers Other (See Comments)  ?  Dropped blood pressure too low  ? Simvastatin Other (See Comments)  ?  myalgia  ? ?Current Outpatient Medications on File Prior to Visit  ?Medication Sig Dispense Refill  ? acetaminophen (TYLENOL) 650 MG CR tablet Take 650 mg by mouth every 8 (eight) hours as needed for pain.    ? calcium-vitamin D (OSCAL WITH D) 500-200 MG-UNIT per tablet Take 1 tablet by mouth 2 (two) times daily.    ? cetirizine (ZYRTEC) 10 MG tablet Take 10 mg by mouth daily.    ? clopidogrel (PLAVIX) 75 MG tablet TAKE 1 TABLET EVERY DAY 90 tablet 1  ? fish oil-omega-3 fatty acids 1000 MG capsule Take 1 g by mouth 2 (two) times daily.    ? folic acid (FOLVITE) 096 MCG tablet Take 400 mcg by mouth daily.    ? Guaifenesin (MUCINEX MAXIMUM STRENGTH) 1200 MG TB12 Take 1,200 mg by mouth in the morning and at bedtime.    ? Multiple Vitamin (MULTIVITAMIN WITH MINERALS) TABS tablet Take 1 tablet by mouth daily. Centrum Silver    ? rosuvastatin (CRESTOR) 10 MG tablet Take 0.5-1 tablets (5-10 mg total) by mouth See admin instructions. Alternating 10 mg with 5 mg every other day 75 tablet 1  ? tamsulosin (FLOMAX) 0.4 MG CAPS capsule TAKE 1 CAPSULE EVERY DAY 90 capsule 1  ? ?No current facility-administered medications on file prior to visit.  ? ?     ROS:  All others reviewed and negative. ? ?Objective  ? ?     PE:  BP 120/68 (BP Location: Right Arm, Patient Position: Sitting, Cuff Size: Large)   Pulse 80   Temp 97.9 ?F (36.6 ?C) (Oral)   Ht '5\' 7"'$  (1.702 m)   Wt 177 lb (80.3 kg)   SpO2 95%   BMI 27.72 kg/m?  ? ?              Constitutional: Pt appears in NAD ?              HENT: Head: NCAT.  ?              Right Ear: External ear normal.   ?              Left Ear: External ear normal.  ?              Eyes: . Pupils are equal, round, and reactive to light. Conjunctivae and EOM are normal ?              Nose: without d/c or deformity ?              Neck: Neck supple. Gross normal ROM ?               Cardiovascular: Normal rate and regular rhythm.   ?              Pulmonary/Chest: Effort normal and breath sounds without rales or wheezing.  ?  Abd:  Soft, NT, ND, + BS, no organomegaly ?              Neurological: Pt is alert. At baseline orientation, motor grossly intact ?              Skin: Skin is warm. No rashes, no other new lesions, LE edema - none ?              Psychiatric: Pt behavior is normal without agitation  ? ?Micro: none ? ?Cardiac tracings I have personally interpreted today:  none ? ?Pertinent Radiological findings (summarize): none  ? ?Lab Results  ?Component Value Date  ? WBC 7.0 10/02/2021  ? HGB 15.7 10/02/2021  ? HCT 47.4 10/02/2021  ? PLT 223.0 10/02/2021  ? GLUCOSE 107 (H) 10/02/2021  ? CHOL 121 10/02/2021  ? TRIG 260.0 (H) 10/02/2021  ? HDL 33.90 (L) 10/02/2021  ? LDLDIRECT 57.0 10/02/2021  ? [place] 41 09/07/2019  ? ALT 18 10/02/2021  ? AST 20 10/02/2021  ? NA 139 10/02/2021  ? K 4.3 10/02/2021  ? CL 105 10/02/2021  ? CREATININE 1.04 10/02/2021  ? BUN 15 10/02/2021  ? CO2 28 10/02/2021  ? TSH 1.31 10/02/2021  ? PSA 1.40 03/20/2015  ? INR 0.99 12/19/2017  ? HGBA1C 5.6 10/02/2021  ? ?Assessment/Plan:  ?BILLIE INTRIAGO is a 88 y.o. White or Caucasian [1] male with  has a past medical history of Allergic rhinitis, cause unspecified (03/12/2013), Allergy, CAD (coronary artery disease), Cataract, Decreased exercise tolerance, Degenerative arthritis of hip, Diverticulosis of colon, ED (erectile dysfunction), Erectile dysfunction (06/03/2014), History of colonic polyps, History of kidney stones, History of MI (myocardial infarction), adenomatous colonic polyps (2000), Hyperlipidemia, Lumbar disc disease, Myocardial infarction ([place]) (1999), Right knee meniscal tear, S/P removal of thyroid nodule, and Spinal stenosis of lumbar region (03/15/2014). ? ?Allergic rhinitis ?Mild to mod seasonal flare, declines steroid, for zyrtec restart,  to f/u any worsening symptoms or  concerns ? ?Hyperglycemia ?Lab Results  ?Component Value Date  ? HGBA1C 5.6 10/02/2021  ? ?Stable, pt to continue current medical treatment  - diet ? ? ?Hyperlipidemia ?Lab Results  ?Component Value Date  ? [place] 41 09/07/2019  ?

## 2022-04-05 NOTE — Patient Instructions (Signed)
Please continue all other medications as before, and refills have been done if requested. ° °Please have the pharmacy call with any other refills you may need. ° °Please continue your efforts at being more active, low cholesterol diet, and weight control. ° °You are otherwise up to date with prevention measures today. ° °Please keep your appointments with your specialists as you may have planned ° °Please make an Appointment to return in 6 months, or sooner if needed °

## 2022-04-10 ENCOUNTER — Encounter: Payer: Self-pay | Admitting: Internal Medicine

## 2022-04-10 NOTE — Assessment & Plan Note (Signed)
BP Readings from Last 3 Encounters:  ?04/05/22 120/68  ?03/09/22 118/70  ?10/02/21 (!) 142/80  ? ?Stable, pt to continue medical treatment  - diet low salt, wt control ? ?

## 2022-04-10 NOTE — Assessment & Plan Note (Signed)
Lab Results  ?Component Value Date  ? [place] 41 09/07/2019  ? ?Stable, pt to continue current statin crestor 10 ? ?

## 2022-04-10 NOTE — Assessment & Plan Note (Signed)
Lab Results  ?Component Value Date  ? HGBA1C 5.6 10/02/2021  ? ?Stable, pt to continue current medical treatment  - diet ? ?

## 2022-04-10 NOTE — Assessment & Plan Note (Signed)
Mild to mod seasonal flare, declines steroid, for zyrtec restart,  to f/u any worsening symptoms or concerns ?

## 2022-06-24 ENCOUNTER — Other Ambulatory Visit: Payer: Self-pay | Admitting: Internal Medicine

## 2022-06-24 NOTE — Telephone Encounter (Signed)
Please refill as per office routine med refill policy (all routine meds to be refilled for 3 mo or monthly (per pt preference) up to one year from last visit, then month to month grace period for 3 mo, then further med refills will have to be denied) ? ?

## 2022-07-01 ENCOUNTER — Other Ambulatory Visit: Payer: Self-pay | Admitting: Neurological Surgery

## 2022-07-19 ENCOUNTER — Ambulatory Visit: Payer: Medicare Other | Admitting: Cardiovascular Disease

## 2022-07-30 ENCOUNTER — Other Ambulatory Visit: Payer: Self-pay | Admitting: Cardiovascular Disease

## 2022-08-17 ENCOUNTER — Encounter: Payer: Self-pay | Admitting: Cardiovascular Disease

## 2022-08-17 NOTE — Telephone Encounter (Signed)
Patient had labs in Oct 2022, LDL Direct was 57.  Per MyChart message has been on Crestor 5 mg daily for almost a year.  Okay to refill as 5 mg tablet to [facility]?

## 2022-08-19 MED ORDER — ROSUVASTATIN CALCIUM 5 MG PO TABS: 5.0000 mg | ORAL_TABLET | Freq: Every day | ORAL | 3 refills | Status: DC

## 2022-08-19 NOTE — Telephone Encounter (Signed)
OK to fill the 5 mg dosage. Larry [name] in refill of rosuvastatin 5 mg.

## 2022-09-02 DIAGNOSIS — M65341 Trigger finger, right ring finger: Secondary | ICD-10-CM | POA: Diagnosis not present

## 2022-09-07 DIAGNOSIS — C44612 Basal cell carcinoma of skin of right upper limb, including shoulder: Secondary | ICD-10-CM | POA: Diagnosis not present

## 2022-09-07 DIAGNOSIS — L570 Actinic keratosis: Secondary | ICD-10-CM | POA: Diagnosis not present

## 2022-09-07 DIAGNOSIS — L821 Other seborrheic keratosis: Secondary | ICD-10-CM | POA: Diagnosis not present

## 2022-09-07 DIAGNOSIS — D485 Neoplasm of uncertain behavior of skin: Secondary | ICD-10-CM | POA: Diagnosis not present

## 2022-09-07 DIAGNOSIS — Z85828 Personal history of other malignant neoplasm of skin: Secondary | ICD-10-CM | POA: Diagnosis not present

## 2022-09-07 DIAGNOSIS — L859 Epidermal thickening, unspecified: Secondary | ICD-10-CM | POA: Diagnosis not present

## 2022-09-07 DIAGNOSIS — L578 Other skin changes due to chronic exposure to nonionizing radiation: Secondary | ICD-10-CM | POA: Diagnosis not present

## 2022-09-07 DIAGNOSIS — L814 Other melanin hyperpigmentation: Secondary | ICD-10-CM | POA: Diagnosis not present

## 2022-09-07 DIAGNOSIS — C44712 Basal cell carcinoma of skin of right lower limb, including hip: Secondary | ICD-10-CM | POA: Diagnosis not present

## 2022-09-09 ENCOUNTER — Ambulatory Visit (INDEPENDENT_AMBULATORY_CARE_PROVIDER_SITE_OTHER): Payer: Medicare Other | Admitting: *Deleted

## 2022-09-09 DIAGNOSIS — Z23 Encounter for immunization: Secondary | ICD-10-CM | POA: Diagnosis not present

## 2022-09-09 NOTE — Progress Notes (Signed)
Patient here for his high dose flu shot. Given in right deltoid. Patient tolerated well

## 2022-09-20 DIAGNOSIS — C44712 Basal cell carcinoma of skin of right lower limb, including hip: Secondary | ICD-10-CM | POA: Diagnosis not present

## 2022-10-05 ENCOUNTER — Ambulatory Visit (INDEPENDENT_AMBULATORY_CARE_PROVIDER_SITE_OTHER): Payer: Medicare Other | Admitting: Internal Medicine

## 2022-10-05 VITALS — BP 116/64 | HR 74 | Temp 98.0°F | Ht 67.0 in | Wt 172.0 lb

## 2022-10-05 DIAGNOSIS — I25810 Atherosclerosis of coronary artery bypass graft(s) without angina pectoris: Secondary | ICD-10-CM | POA: Diagnosis not present

## 2022-10-05 DIAGNOSIS — R29898 Other symptoms and signs involving the musculoskeletal system: Secondary | ICD-10-CM | POA: Insufficient documentation

## 2022-10-05 DIAGNOSIS — E559 Vitamin D deficiency, unspecified: Secondary | ICD-10-CM

## 2022-10-05 DIAGNOSIS — E538 Deficiency of other specified B group vitamins: Secondary | ICD-10-CM | POA: Diagnosis not present

## 2022-10-05 DIAGNOSIS — I10 Essential (primary) hypertension: Secondary | ICD-10-CM | POA: Diagnosis not present

## 2022-10-05 DIAGNOSIS — Z4802 Encounter for removal of sutures: Secondary | ICD-10-CM | POA: Diagnosis not present

## 2022-10-05 DIAGNOSIS — E7800 Pure hypercholesterolemia, unspecified: Secondary | ICD-10-CM | POA: Diagnosis not present

## 2022-10-05 DIAGNOSIS — R739 Hyperglycemia, unspecified: Secondary | ICD-10-CM | POA: Diagnosis not present

## 2022-10-05 DIAGNOSIS — R2689 Other abnormalities of gait and mobility: Secondary | ICD-10-CM | POA: Insufficient documentation

## 2022-10-05 DIAGNOSIS — C44712 Basal cell carcinoma of skin of right lower limb, including hip: Secondary | ICD-10-CM | POA: Insufficient documentation

## 2022-10-05 LAB — CBC WITH DIFFERENTIAL/PLATELET
Basophils Absolute: 0 10*3/uL (ref 0.0–0.1)
Basophils Relative: 0.7 % (ref 0.0–3.0)
Eosinophils Absolute: 0.2 10*3/uL (ref 0.0–0.7)
Eosinophils Relative: 2.6 % (ref 0.0–5.0)
HCT: 47.1 % (ref 39.0–52.0)
Hemoglobin: 16.1 g/dL (ref 13.0–17.0)
Lymphocytes Relative: 26 % (ref 12.0–46.0)
Lymphs Abs: 1.7 10*3/uL (ref 0.7–4.0)
MCHC: 34.2 g/dL (ref 30.0–36.0)
MCV: 91.3 fl (ref 78.0–100.0)
Monocytes Absolute: 0.6 10*3/uL (ref 0.1–1.0)
Monocytes Relative: 9.1 % (ref 3.0–12.0)
Neutro Abs: 4.1 10*3/uL (ref 1.4–7.7)
Neutrophils Relative %: 61.6 % (ref 43.0–77.0)
Platelets: 223 10*3/uL (ref 150.0–400.0)
RBC: 5.15 Mil/uL (ref 4.22–5.81)
RDW: 13.8 % (ref 11.5–15.5)
WBC: 6.7 10*3/uL (ref 4.0–10.5)

## 2022-10-05 LAB — URINALYSIS, ROUTINE W REFLEX MICROSCOPIC
Bilirubin Urine: NEGATIVE
Hgb urine dipstick: NEGATIVE
Ketones, ur: NEGATIVE
Leukocytes,Ua: NEGATIVE
Nitrite: NEGATIVE
RBC / HPF: NONE SEEN (ref 0–?)
Specific Gravity, Urine: 1.01 (ref 1.000–1.030)
Total Protein, Urine: NEGATIVE
Urine Glucose: NEGATIVE
Urobilinogen, UA: 0.2 (ref 0.0–1.0)
pH: 6 (ref 5.0–8.0)

## 2022-10-05 LAB — LIPID PANEL
Cholesterol: 104 mg/dL (ref 0–200)
HDL: 36.4 mg/dL — ABNORMAL LOW (ref >39.00)
LDL Cholesterol: 41 mg/dL (ref 0–99)
NonHDL: 67.84
Total CHOL/HDL Ratio: 3
Triglycerides: 132 mg/dL (ref 0.0–149.0)
VLDL: 26.4 mg/dL (ref 0.0–40.0)

## 2022-10-05 LAB — HEPATIC FUNCTION PANEL
ALT: 18 U/L (ref 0–53)
AST: 22 U/L (ref 0–37)
Albumin: 4.3 g/dL (ref 3.5–5.2)
Alkaline Phosphatase: 38 U/L — ABNORMAL LOW (ref 39–117)
Bilirubin, Direct: 0.2 mg/dL (ref 0.0–0.3)
Total Bilirubin: 0.9 mg/dL (ref 0.2–1.2)
Total Protein: 7.3 g/dL (ref 6.0–8.3)

## 2022-10-05 LAB — BASIC METABOLIC PANEL
BUN: 15 mg/dL (ref 6–23)
CO2: 27 mEq/L (ref 19–32)
Calcium: 9.3 mg/dL (ref 8.4–10.5)
Chloride: 106 mEq/L (ref 96–112)
Creatinine, Ser: 1.09 mg/dL (ref 0.40–1.50)
GFR: 60.87 mL/min (ref >60.00)
Glucose, Bld: 107 mg/dL — ABNORMAL HIGH (ref 70–99)
Potassium: 4.5 mEq/L (ref 3.5–5.1)
Sodium: 140 mEq/L (ref 135–145)

## 2022-10-05 LAB — VITAMIN D 25 HYDROXY (VIT D DEFICIENCY, FRACTURES): VITD: 43.44 ng/mL (ref 30.00–100.00)

## 2022-10-05 LAB — HEMOGLOBIN A1C: Hgb A1c MFr Bld: 5.8 % (ref 4.6–6.5)

## 2022-10-05 LAB — TSH: TSH: 1.46 u[IU]/mL (ref 0.35–5.50)

## 2022-10-05 LAB — VITAMIN B12: Vitamin B-12: 625 pg/mL (ref 211–911)

## 2022-10-05 NOTE — Patient Instructions (Signed)

## 2022-10-05 NOTE — Progress Notes (Unsigned)
Patient ID: Larry [name], male   DOB: 1932-03-29, 89 y.o.   MRN: [id number]         Chief Complaint:: yearly exam       HPI:  Larry [name] is a 88 y.o. male here overall doing ok, Pt denies chest pain, increased sob or doe, wheezing, orthopnea, PND, increased LE swelling, palpitations, dizziness or syncope.   Pt denies polydipsia, polyuria, or new focal neuro s/s.    Pt denies fever, wt loss, night sweats, loss of appetite, or other constitutional symptoms    Also wife with CLL, so increased stress recently.  Lost 5 lbs with better diet and maybe stress    Wt Readings from Last 3 Encounters:  10/06/22 175 lb 3.2 oz (79.5 kg)  10/05/22 172 lb (78 kg)  04/05/22 177 lb (80.3 kg)   BP Readings from Last 3 Encounters:  10/06/22 120/72  10/05/22 116/64  04/05/22 120/68   Immunization History  Administered Date(s) Administered   Fluad Quad(high Dose 65+) 09/07/2019, 09/30/2020, 10/02/2021, 09/09/2022   Influenza Split 10/01/2011, 09/06/2012   Influenza Whole 09/20/2007, 10/04/2008, 10/13/2009, 09/07/2010   Influenza, High Dose Seasonal PF 08/12/2017, 09/01/2018   Influenza,inj,Quad PF,6+ Mos 09/26/2013, 09/19/2014   Influenza-Unspecified 12/02/1999, 10/15/2015   PFIZER(Purple Top)SARS-COV-2 Vaccination 01/25/2020, 02/15/2020, 09/27/2020, 03/23/2021, 09/03/2021   Pneumococcal Conjugate-13 03/15/2014   Pneumococcal Polysaccharide-23 01/20/2007, 01/28/2021   Td 01/20/2002   Tetanus 03/12/2013   Zoster Recombinat (Shingrix) 01/30/2019, 07/03/2019   There are no preventive care reminders to display for this patient.     Past Medical History:  Diagnosis Date   Allergic rhinitis, cause unspecified 03/12/2013   Allergy    CAD (coronary artery disease)    S/P CABG in 1999    Cataract    Decreased exercise tolerance    Exertional fatigue   Degenerative arthritis of hip    Diverticulosis of colon    ED (erectile dysfunction)    Erectile dysfunction 06/03/2014   History of colonic  polyps    History of kidney stones    History of MI (myocardial infarction)    Hx of adenomatous colonic polyps 2000   Hyperlipidemia    Low HDL   Lumbar disc disease    Hx of with recent back pain and buttock pain   Myocardial infarction ([place]) 1999   Right knee meniscal tear    S/P removal of thyroid nodule    Spinal stenosis of lumbar region 03/15/2014   Past Surgical History:  Procedure Laterality Date   BACK SURGERY  1960   CARDIAC CATHETERIZATION     CATARACT EXTRACTION     COLONOSCOPY     CORONARY ARTERY BYPASS GRAFT  1999   CYSTOSCOPY/URETEROSCOPY/HOLMIUM LASER/STENT PLACEMENT Left 12/23/2017   Procedure: CYSTOSCOPY/RETROGRADE/URETEROSCOPY/HOLMIUM LASER/STENT PLACEMENT;  Surgeon: Festus Aloe, MD;  Location: [facility];  Service: Urology;  Laterality: Left;  ONLY NEEDS 60 MIN   EYE SURGERY Bilateral    bilaterl cataract removal   LUMBAR DISC SURGERY     LUMBAR LAMINECTOMY/DECOMPRESSION MICRODISCECTOMY Bilateral 07/06/2021   Procedure: Bilateral Lumbar three-four Laminectomy and foraminotomy;  Surgeon: Kristeen Miss, MD;  Location: [facility];  Service: Neurosurgery;  Laterality: Bilateral;   Nodules on Thyroid     POLYPECTOMY     PTCA     SHOULDER ARTHROSCOPY WITH ROTATOR CUFF REPAIR AND SUBACROMIAL DECOMPRESSION Right 01/30/2015   Procedure: RIGHT ARTHROSCOPY SHOULDER SUBACROMIAL DECOMPRESSION DISTAL CLAVICAL RESECTION RETATOR CUFF REPAIR;  Surgeon: Justice Britain, MD;  Location: [facility];  Service: Orthopedics;  Laterality: Right;   TONSILLECTOMY     VASECTOMY      reports that he has never smoked. He has never used smokeless tobacco. He reports current alcohol use. He reports that he does not use drugs. family history includes Heart disease in his brother and mother; Prostate cancer in some other family members. Allergies  Allergen Reactions   Beta Adrenergic Blockers Other (See Comments)    Dropped blood pressure too low   Simvastatin Other (See Comments)    myalgia    Current Outpatient Medications on File Prior to Visit  Medication Sig Dispense Refill   acetaminophen (TYLENOL) 650 MG CR tablet Take 650 mg by mouth every 8 (eight) hours as needed for pain.     calcium-vitamin D (OSCAL WITH D) 500-200 MG-UNIT per tablet Take 1 tablet by mouth 2 (two) times daily.     cetirizine (ZYRTEC) 10 MG tablet Take 10 mg by mouth daily.     clopidogrel (PLAVIX) 75 MG tablet TAKE 1 TABLET EVERY DAY 90 tablet 1   fish oil-omega-3 fatty acids 1000 MG capsule Take 1 g by mouth 2 (two) times daily.     folic acid (FOLVITE) 092 MCG tablet Take 400 mcg by mouth daily.     Multiple Vitamin (MULTIVITAMIN WITH MINERALS) TABS tablet Take 1 tablet by mouth daily. Centrum Silver     tamsulosin (FLOMAX) 0.4 MG CAPS capsule TAKE 1 CAPSULE EVERY DAY 90 capsule 1   Acetaminophen (TYLENOL) 325 MG CAPS 2 capsules as needed Orally every 6 hrs     No current facility-administered medications on file prior to visit.        ROS:  All others reviewed and negative.  Objective        PE:  BP 116/64 (BP Location: Left Arm, Patient Position: Sitting, Cuff Size: Large)   Pulse 74   Temp 98 F (36.7 C) (Oral)   Ht '5\' 7"'$  (1.702 m)   Wt 172 lb (78 kg)   SpO2 93%   BMI 26.94 kg/m                 Constitutional: Pt appears in NAD               HENT: Head: NCAT.                Right Ear: External ear normal.                 Left Ear: External ear normal.                Eyes: . Pupils are equal, round, and reactive to light. Conjunctivae and EOM are normal               Nose: without d/c or deformity               Neck: Neck supple. Gross normal ROM               Cardiovascular: Normal rate and regular rhythm.                 Pulmonary/Chest: Effort normal and breath sounds without rales or wheezing.                Abd:  Soft, NT, ND, + BS, no organomegaly               Neurological: Pt is alert. At baseline orientation, motor grossly intact  Skin: Skin is warm. No  rashes, no other new lesions, LE edema - none               Psychiatric: Pt behavior is normal without agitation   Micro: none  Cardiac tracings I have personally interpreted today:  none  Pertinent Radiological findings (summarize): none   Lab Results  Component Value Date   WBC 6.7 10/05/2022   HGB 16.1 10/05/2022   HCT 47.1 10/05/2022   PLT 223.0 10/05/2022   GLUCOSE 107 (H) 10/05/2022   CHOL 104 10/05/2022   TRIG 132.0 10/05/2022   HDL 36.40 (L) 10/05/2022   LDLDIRECT 57.0 10/02/2021   LDLCALC 41 10/05/2022   ALT 18 10/05/2022   AST 22 10/05/2022   NA 140 10/05/2022   K 4.5 10/05/2022   CL 106 10/05/2022   CREATININE 1.09 10/05/2022   BUN 15 10/05/2022   CO2 27 10/05/2022   TSH 1.46 10/05/2022   PSA 1.40 03/20/2015   INR 0.99 12/19/2017   HGBA1C 5.8 10/05/2022   Assessment/Plan:  Larry [name] is a 88 y.o. White or Caucasian [1] male with  has a past medical history of Allergic rhinitis, cause unspecified (03/12/2013), Allergy, CAD (coronary artery disease), Cataract, Decreased exercise tolerance, Degenerative arthritis of hip, Diverticulosis of colon, ED (erectile dysfunction), Erectile dysfunction (06/03/2014), History of colonic polyps, History of kidney stones, History of MI (myocardial infarction), adenomatous colonic polyps (2000), Hyperlipidemia, Lumbar disc disease, Myocardial infarction ([place]) (1999), Right knee meniscal tear, S/P removal of thyroid nodule, and Spinal stenosis of lumbar region (03/15/2014).  HYPERTENSION, BENIGN BP Readings from Last 3 Encounters:  10/06/22 120/72  10/05/22 116/64  04/05/22 120/68   Stable, pt to continue low salt diet, wt control, excercise   Hyperlipidemia Lab Results  Component Value Date   LDLCALC 41 10/05/2022   Stable, pt to continue current statin crestor 5 mg qd   Hyperglycemia Lab Results  Component Value Date   HGBA1C 5.8 10/05/2022   Stable, pt to continue current medical treatment  - diet, wt  control, excercise  Followup: Return in about 1 year (around 10/06/2023).  Cathlean Cower, MD 10/06/2022 7:36 PM [facility]

## 2022-10-06 ENCOUNTER — Encounter: Payer: Self-pay | Admitting: Internal Medicine

## 2022-10-06 ENCOUNTER — Ambulatory Visit: Payer: Medicare Other | Attending: Cardiovascular Disease | Admitting: Cardiovascular Disease

## 2022-10-06 ENCOUNTER — Encounter: Payer: Self-pay | Admitting: Cardiovascular Disease

## 2022-10-06 VITALS — BP 120/72 | HR 60 | Ht 67.0 in | Wt 175.2 lb

## 2022-10-06 DIAGNOSIS — I351 Nonrheumatic aortic (valve) insufficiency: Secondary | ICD-10-CM | POA: Diagnosis not present

## 2022-10-06 DIAGNOSIS — I493 Ventricular premature depolarization: Secondary | ICD-10-CM | POA: Diagnosis not present

## 2022-10-06 DIAGNOSIS — I7121 Aneurysm of the ascending aorta, without rupture: Secondary | ICD-10-CM | POA: Insufficient documentation

## 2022-10-06 DIAGNOSIS — I25810 Atherosclerosis of coronary artery bypass graft(s) without angina pectoris: Secondary | ICD-10-CM | POA: Insufficient documentation

## 2022-10-06 DIAGNOSIS — E7800 Pure hypercholesterolemia, unspecified: Secondary | ICD-10-CM | POA: Diagnosis not present

## 2022-10-06 MED ORDER — ROSUVASTATIN CALCIUM 5 MG PO TABS: ORAL_TABLET | ORAL | 3 refills | Status: DC

## 2022-10-06 NOTE — Patient Instructions (Signed)
Medication Instructions:  No changes *If you need a refill on your cardiac medications before your next appointment, please call your pharmacy*   Lab Work: none If you have labs (blood work) drawn today and your tests are completely normal, you will receive your results only by: [place] (if you have MyChart) OR A paper copy in the mail If you have any lab test that is abnormal or we need to change your treatment, we will call you to review the results.   Testing/Procedures: none   Follow-Up: At [facility], you and your health needs are our priority.  As part of our continuing mission to provide you with exceptional heart care, we have created designated Provider Care Teams.  These Care Teams include your primary Cardiologist (physician) and Advanced Practice Providers (APPs -  Physician Assistants and Nurse Practitioners) who all work together to provide you with the care you need, when you need it.  We recommend signing up for the patient portal called "MyChart".  Sign up information is provided on this After Visit Summary.  MyChart is used to connect with patients for Virtual Visits (Telemedicine).  Patients are able to view lab/test results, encounter notes, upcoming appointments, etc.  Non-urgent messages can be sent to your provider as well.   To learn more about what you can do with MyChart, go to [email or web address].    Your next appointment:   6 month(s)  The format for your next appointment:   In Person  Provider:   Lauree Chandler, MD     Other Instructions   Important Information About Sugar

## 2022-10-06 NOTE — Progress Notes (Signed)
Chief Complaint  Patient presents with   Follow-up    CAD   History of Present Illness: 88 yo male with history of CAD s/p 3V CABG in 1999, HLD and PVCs here for cardiac follow up. He had 3V CABG in 1999 at [facility] in [place]. In 2008 he had a catheterization and was found to have a patent LIMA to LAD and a patent vein graft to the right coronary and no significant obstruction in the circumflex artery. Exercise stress test April 2015 with no ischemia. He has had PVCs noted on cardiac monitor and was started on Cardizem but he did not tolerate the Cardizem due to dizziness so it was stopped. Echo January 2020 with LVEF=50-55%, mild AI and mildly dilated aortic root. Chest CTA May 2021 with stable 4.3-4.4 cm ascending aortic aneurysm with slight enlargement of 4.3 cm at the sinuses of Valsalva. Echo with LVEF=45%. Mild MR. Mild to moderate AI.   He is here today for follow up. The patient denies any chest pain, dyspnea, palpitations, lower extremity edema, orthopnea, PND, dizziness, near syncope or syncope.   Primary Care Physician: Biagio Borg, MD  Past Medical History:  Diagnosis Date   Allergic rhinitis, cause unspecified 03/12/2013   Allergy    CAD (coronary artery disease)    S/P CABG in 1999    Cataract    Decreased exercise tolerance    Exertional fatigue   Degenerative arthritis of hip    Diverticulosis of colon    ED (erectile dysfunction)    Erectile dysfunction 06/03/2014   History of colonic polyps    History of kidney stones    History of MI (myocardial infarction)    Hx of adenomatous colonic polyps 2000   Hyperlipidemia    Low HDL   Lumbar disc disease    Hx of with recent back pain and buttock pain   Myocardial infarction ([place]) 1999   Right knee meniscal tear    S/P removal of thyroid nodule    Spinal stenosis of lumbar region 03/15/2014    Past Surgical History:  Procedure Laterality Date   BACK SURGERY  1960   CARDIAC CATHETERIZATION      CATARACT EXTRACTION     COLONOSCOPY     CORONARY ARTERY BYPASS GRAFT  1999   CYSTOSCOPY/URETEROSCOPY/HOLMIUM LASER/STENT PLACEMENT Left 12/23/2017   Procedure: CYSTOSCOPY/RETROGRADE/URETEROSCOPY/HOLMIUM LASER/STENT PLACEMENT;  Surgeon: Festus Aloe, MD;  [facility];  Service: Urology;  Laterality: Left;  ONLY NEEDS 60 MIN   EYE SURGERY Bilateral    bilaterl cataract removal   LUMBAR DISC SURGERY     LUMBAR LAMINECTOMY/DECOMPRESSION MICRODISCECTOMY Bilateral 07/06/2021   Procedure: Bilateral Lumbar three-four Laminectomy and foraminotomy;  Surgeon: Kristeen Miss, MD;  Location: [facility];  Service: Neurosurgery;  Laterality: Bilateral;   Nodules on Thyroid     POLYPECTOMY     PTCA     SHOULDER ARTHROSCOPY WITH ROTATOR CUFF REPAIR AND SUBACROMIAL DECOMPRESSION Right 01/30/2015   Procedure: RIGHT ARTHROSCOPY SHOULDER SUBACROMIAL DECOMPRESSION DISTAL CLAVICAL RESECTION RETATOR CUFF REPAIR;  Surgeon: Justice Britain, MD;  Location: [facility];  Service: Orthopedics;  Laterality: Right;   TONSILLECTOMY     VASECTOMY      Current Outpatient Medications  Medication Sig Dispense Refill   Acetaminophen (TYLENOL) 325 MG CAPS 2 capsules as needed Orally every 6 hrs     acetaminophen (TYLENOL) 650 MG CR tablet Take 650 mg by mouth every 8 (eight) hours as needed for pain.  calcium-vitamin D (OSCAL WITH D) 500-200 MG-UNIT per tablet Take 1 tablet by mouth 2 (two) times daily.     cetirizine (ZYRTEC) 10 MG tablet Take 10 mg by mouth daily.     clopidogrel (PLAVIX) 75 MG tablet TAKE 1 TABLET EVERY DAY 90 tablet 1   fish oil-omega-3 fatty acids 1000 MG capsule Take 1 g by mouth 2 (two) times daily.     folic acid (FOLVITE) 431 MCG tablet Take 400 mcg by mouth daily.     Multiple Vitamin (MULTIVITAMIN WITH MINERALS) TABS tablet Take 1 tablet by mouth daily. Centrum Silver     tamsulosin (FLOMAX) 0.4 MG CAPS capsule TAKE 1 CAPSULE EVERY DAY 90 capsule 1   rosuvastatin (CRESTOR) 5 MG tablet Take 1 tablet  daily alternating with 2 tablets every other day 135 tablet 3   No current facility-administered medications for this visit.    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  Tobacco Use   Smoking status: Never   Smokeless tobacco: Never  Vaping Use   Vaping Use: Never used  Substance and Sexual Activity   Alcohol use: Yes    Alcohol/week: 0.0 standard drinks of alcohol    Comment: rare   Drug use: No   Sexual activity: Not Currently  Other Topics Concern   Not on file  Social History Narrative   Moved from [facility]   Married      [employer] on file   Social Determinants of Health   Financial Resource Strain: Low Risk  (02/16/2022)   Overall Financial Resource Strain (CARDIA)    Difficulty of Paying Living Expenses: Not hard at all  Food Insecurity: No Food Insecurity (02/16/2022)   Hunger Vital Sign    Worried About Running Out of Food in the Last Year: Never true    [place] in the Last Year: Never true  Transportation Needs: No Transportation Needs (02/16/2022)   PRAPARE - Hydrologist (Medical): No    Lack of Transportation (Non-Medical): No  Physical Activity: Sufficiently Active (02/16/2022)   Exercise Vital Sign    Days of Exercise per Week: 5 days    Minutes of Exercise per Session: 30 min  Stress: No Stress Concern Present (02/16/2022)   [place]    Feeling of Stress : Not at all  Social Connections: [place] (02/16/2022)   Social Connection  and Isolation Panel [NHANES]    Frequency of Communication with Friends and Family: More than three times a week    Frequency of Social Gatherings with Friends and Family: Once a week    Attends Religious Services: 1 to 4 times per year    Active Member of [employer] or Organizations: No    Attends Archivist Meetings: 1 to 4 times per year    Marital Status: Married  Human resources officer Violence: Not At Risk (02/16/2022)   Humiliation, Afraid, Rape, and Kick questionnaire  Fear of Current or Ex-Partner: No    Emotionally Abused: No    Physically Abused: No    Sexually Abused: No    Family History  Problem Relation Age of Onset   Prostate cancer Other    Prostate cancer Other    Heart disease Mother    Heart disease Brother    Colon cancer Neg Hx    Rectal cancer Neg Hx    Stomach cancer Neg Hx     Review of Systems:  As stated in the HPI and otherwise negative.   BP 120/72   Pulse 60   Ht '5\' 7"'$  (1.702 m)   Wt 175 lb 3.2 oz (79.5 kg)   SpO2 97%   BMI 27.44 kg/m   Physical Examination: General: Well developed, well nourished, NAD  HEENT: OP clear, mucus membranes moist  SKIN: warm, dry. No rashes. Neuro: No focal deficits  Musculoskeletal: Muscle strength 5/5 all ext  Psychiatric: Mood and affect normal  Neck: No JVD, no carotid bruits, no thyromegaly, no lymphadenopathy.  Lungs:Clear bilaterally, no wheezes, rhonci, crackles Cardiovascular: Regular rate and rhythm. No murmurs, gallops or rubs. Abdomen:Soft. Bowel sounds present. Non-tender.  Extremities: No lower extremity edema. Pulses are 2 + in the bilateral DP/PT.  Echo April 2023:  1. Left ventricular ejection fraction, by estimation, is 40 to 45%. The  left ventricle has mildly decreased function. The left ventricle  demonstrates global hypokinesis. The left ventricular internal cavity size  was mildly dilated. There is mild  concentric left ventricular hypertrophy. Left ventricular diastolic   parameters are consistent with Grade I diastolic dysfunction (impaired  relaxation). There is septal-lateral dyskinesis secondary to LBBB.   2. Right ventricular systolic function is mildly reduced. The right  ventricular size is normal.   3. The mitral valve is normal in structure. Mild mitral valve  regurgitation.   4. The aortic valve is tricuspid. There is mild calcification of the  aortic valve. There is mild thickening of the aortic valve. Aortic valve  regurgitation is mild to moderate. Aortic valve sclerosis/calcification is  present, without any evidence of  aortic stenosis.   5. Aortic dilatation noted. There is moderate dilatation of the ascending  aorta, measuring 46 mm. There is mild dilatation of the aortic root,  measuring 42 mm.   EKG:  EKG is not ordered today The ekg ordered today demonstrates   Recent Labs: 10/05/2022: ALT 18; BUN 15; Creatinine, Ser 1.09; Hemoglobin 16.1; Platelets 223.0; Potassium 4.5; Sodium 140; TSH 1.46   Lipid Panel    Component Value Date/Time   CHOL 104 10/05/2022 1054   CHOL 121 05/23/2018 0939   CHOL 118 04/03/2014 0843   TRIG 132.0 10/05/2022 1054   TRIG 162 (H) 04/03/2014 0843   HDL 36.40 (L) 10/05/2022 1054   HDL 34 (L) 05/23/2018 0939   HDL 36 (L) 04/03/2014 0843   CHOLHDL 3 10/05/2022 1054   VLDL 26.4 10/05/2022 1054   LDLCALC 41 10/05/2022 1054   LDLCALC 61 05/23/2018 0939   LDLCALC 50 04/03/2014 0843   LDLDIRECT 57.0 10/02/2021 1219     Wt Readings from Last 3 Encounters:  10/06/22 175 lb 3.2 oz (79.5 kg)  10/05/22 172 lb (78 kg)  04/05/22 177 lb (80.3 kg)    Assessment and Plan:   1. CAD s/p CABG without angina: No chest pain. LVEF 45% by echo in April 2023. Continue Plavix and statin.   2. PVCs: No palpitations. He did not tolerate Cardizem in the  past and does not wish to take a beta blocker or other calcium channel blocker.   3. HLD: LDL 57 in October 2022. Continue statin.  Repeat lipids and LFTs now   4.  Ascending aortic aneurysm: Ascending aorta dilated at 4.4 cm by CTA chest May 2021. He does not wish to repeat this test due to his age.   5. Aortic valve insufficiency: Mild to moderate by echo in April 2023.    6. LBBB: New in 2023. He has no ischemic symptoms. No plans for ischemic testing given advanced age and lack of symptoms.   Current medicines are reviewed at length with the patient today.  The patient does not have concerns regarding medicines.  The following changes have been made:  no change  Labs/ tests ordered today include:   No orders of the defined types were placed in this encounter.  Disposition:   F/U with me in 12 months  Signed, Lauree Chandler, MD 10/06/2022 4:26 PM    [facility] [address], [place], [place]  [address] Phone: [phone]; Fax: [phone]

## 2022-10-06 NOTE — Assessment & Plan Note (Signed)
Lab Results  Component Value Date   LDLCALC 41 10/05/2022   Stable, pt to continue current statin crestor 5 mg qd

## 2022-10-06 NOTE — Assessment & Plan Note (Signed)
Lab Results  Component Value Date   HGBA1C 5.8 10/05/2022   Stable, pt to continue current medical treatment  - diet, wt control, excercise

## 2022-10-06 NOTE — Assessment & Plan Note (Signed)
BP Readings from Last 3 Encounters:  10/06/22 120/72  10/05/22 116/64  04/05/22 120/68   Stable, pt to continue low salt diet, wt control, excercise

## 2022-10-07 DIAGNOSIS — M65341 Trigger finger, right ring finger: Secondary | ICD-10-CM | POA: Diagnosis not present

## 2022-11-03 DIAGNOSIS — N281 Cyst of kidney, acquired: Secondary | ICD-10-CM | POA: Diagnosis not present

## 2022-11-03 DIAGNOSIS — N401 Enlarged prostate with lower urinary tract symptoms: Secondary | ICD-10-CM | POA: Diagnosis not present

## 2022-11-03 DIAGNOSIS — R351 Nocturia: Secondary | ICD-10-CM | POA: Diagnosis not present

## 2022-11-22 ENCOUNTER — Other Ambulatory Visit: Payer: Self-pay | Admitting: Cardiovascular Disease

## 2022-12-03 DIAGNOSIS — Z23 Encounter for immunization: Secondary | ICD-10-CM | POA: Diagnosis not present

## 2023-02-22 ENCOUNTER — Ambulatory Visit (INDEPENDENT_AMBULATORY_CARE_PROVIDER_SITE_OTHER): Payer: Medicare Other

## 2023-02-22 VITALS — BP 102/60 | HR 87 | Temp 98.0°F | Ht 67.0 in | Wt 176.2 lb

## 2023-02-22 DIAGNOSIS — Z0000 Encounter for general adult medical examination without abnormal findings: Secondary | ICD-10-CM | POA: Diagnosis not present

## 2023-02-22 NOTE — Progress Notes (Signed)
Subjective:   Larry [name] is a 89 y.o. male who presents for Medicare Annual/Subsequent preventive examination.  Review of Systems     Cardiac Risk Factors include: advanced age (>22mn, >>4women);dyslipidemia;family history of premature cardiovascular disease;hypertension;male gender     Objective:    Today's Vitals   02/22/23 1114 02/22/23 1130  BP: 98/60 102/60  Pulse: 87   Temp: 98 F (36.7 C)   TempSrc: Temporal   SpO2: 93% 93%  Weight: 176 lb 3.2 oz (79.9 kg)   Height: '5\' 7"'$  (1.702 m)   PainSc: 0-No pain    Body mass index is 27.6 kg/m.     02/22/2023   11:15 AM 02/16/2022   10:45 AM 08/04/2021   11:04 AM 07/06/2021    6:23 AM 07/02/2021    1:17 PM 01/28/2021    3:57 PM 08/11/2020    1:20 PM  Advanced Directives  Does Patient Have a Medical Advance Directive? Yes Yes Yes No No Yes Yes  Type of AParamedicof [place]owling [place]Living will [place] will Living will;Healthcare Power of [place] will  Does patient want to make changes to medical advance directive? No - Patient declined No - Patient declined    No - Patient declined   Copy of [place] Chart? Yes - validated most recent copy scanned in chart (See row information)  Yes - validated most recent copy scanned in chart (See row information)      Would patient like information on creating a medical advance directive?    No - Patient declined No - Patient declined      Current Medications (verified) Outpatient Encounter Medications as of 02/22/2023  Medication Sig   Acetaminophen (TYLENOL) 325 MG CAPS 2 capsules as needed Orally every 6 hrs   calcium-vitamin D (OSCAL WITH D) 500-200 MG-UNIT per tablet Take 1 tablet by mouth 2 (two) times daily.   cetirizine (ZYRTEC) 10 MG tablet Take 10 mg by mouth daily.   clopidogrel (PLAVIX) 75 MG tablet TAKE 1 TABLET EVERY DAY   fish oil-omega-3 fatty acids 1000 MG capsule  Take 1 g by mouth 2 (two) times daily.   folic acid (FOLVITE) 4A999333MCG tablet Take 400 mcg by mouth daily.   Multiple Vitamin (MULTIVITAMIN WITH MINERALS) TABS tablet Take 1 tablet by mouth daily. Centrum Silver   rosuvastatin (CRESTOR) 5 MG tablet Take 1 tablet daily alternating with 2 tablets every other day   tamsulosin (FLOMAX) 0.4 MG CAPS capsule TAKE 1 CAPSULE EVERY DAY   [DISCONTINUED] acetaminophen (TYLENOL) 650 MG CR tablet Take 650 mg by mouth every 8 (eight) hours as needed for pain.   No facility-administered encounter medications on file as of 02/22/2023.    Allergies (verified) Beta adrenergic blockers and Simvastatin   History: Past Medical History:  Diagnosis Date   Allergic rhinitis, cause unspecified 03/12/2013   Allergy    CAD (coronary artery disease)    S/P CABG in 1999    Cataract    Decreased exercise tolerance    Exertional fatigue   Degenerative arthritis of hip    Diverticulosis of colon    ED (erectile dysfunction)    Erectile dysfunction 06/03/2014   History of colonic polyps    History of kidney stones    History of MI (myocardial infarction)    Hx of adenomatous colonic polyps 2000   Hyperlipidemia    Low HDL   Lumbar  disc disease    Hx of with recent back pain and buttock pain   Myocardial infarction [facility]) 1999   Right knee meniscal tear    S/P removal of thyroid nodule    Spinal stenosis of lumbar region 03/15/2014   Past Surgical History:  Procedure Laterality Date   BACK SURGERY  1960   CARDIAC CATHETERIZATION     CATARACT EXTRACTION     COLONOSCOPY     CORONARY ARTERY BYPASS GRAFT  1999   CYSTOSCOPY/URETEROSCOPY/HOLMIUM LASER/STENT PLACEMENT Left 12/23/2017   Procedure: CYSTOSCOPY/RETROGRADE/URETEROSCOPY/HOLMIUM LASER/STENT PLACEMENT;  Surgeon: Festus Aloe, MD;  Location: [facility];  Service: Urology;  Laterality: Left;  ONLY NEEDS 60 MIN   EYE SURGERY Bilateral    bilaterl cataract removal   LUMBAR DISC SURGERY     LUMBAR  LAMINECTOMY/DECOMPRESSION MICRODISCECTOMY Bilateral 07/06/2021   Procedure: Bilateral Lumbar three-four Laminectomy and foraminotomy;  Surgeon: Kristeen Miss, MD;  Location: [facility];  Service: Neurosurgery;  Laterality: Bilateral;   Nodules on Thyroid     POLYPECTOMY     PTCA     SHOULDER ARTHROSCOPY WITH ROTATOR CUFF REPAIR AND SUBACROMIAL DECOMPRESSION Right 01/30/2015   Procedure: RIGHT ARTHROSCOPY SHOULDER SUBACROMIAL DECOMPRESSION DISTAL CLAVICAL RESECTION RETATOR CUFF REPAIR;  Surgeon: Justice Britain, MD;  Location: [facility];  Service: Orthopedics;  Laterality: Right;   TONSILLECTOMY     VASECTOMY     Family History  Problem Relation Age of Onset   Prostate cancer Other    Prostate cancer Other    Heart disease Mother    Heart disease Brother    Colon cancer Neg Hx    Rectal cancer Neg Hx    Stomach cancer Neg Hx    Social History   Socioeconomic History   Marital status: Married    Spouse name: Not on file   Number of children: 2   Years of education: Not on file   Highest education level: Not on file  Occupational History   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  Tobacco Use   Smoking status: Never   Smokeless tobacco: Never  Vaping Use   Vaping Use: Never used  Substance and Sexual Activity   Alcohol use: Yes    Alcohol/week: 0.0 standard drinks of alcohol    Comment: rare   Drug use: No   Sexual activity: Not Currently  Other Topics Concern   Not on file  Social History Narrative   Moved from [facility]   Married      [employer] on file   Social Determinants of Health   Financial Resource Strain: Low Risk  (02/22/2023)   Overall Financial Resource Strain (CARDIA)    Difficulty of Paying Living Expenses: Not hard at all  Food Insecurity: No Food Insecurity (02/22/2023)   Hunger Vital Sign    Worried About Running Out of Food in the Last Year: Never true    [place] in the Last Year: Never true  Transportation Needs: No  Transportation Needs (02/22/2023)   PRAPARE - Hydrologist (Medical): No    Lack of Transportation (Non-Medical): No  Physical Activity: Sufficiently Active (02/22/2023)   Exercise Vital Sign    Days of Exercise per Week: 7 days    Minutes of Exercise per Session: 60 min  Stress: No Stress Concern Present (02/22/2023)   [place]    Feeling of Stress : Not at all  Social Connections: Socially Integrated (02/22/2023)   Social Connection and Isolation Panel [NHANES]    Frequency of Communication with Friends and Family: More than three times a week    Frequency of Social Gatherings with Friends and Family: Once a week    Attends Religious Services: 1 to 4 times per year    Active Member of [employer] or Organizations: No    Attends Music therapist: 1 to 4 times per year    Marital Status: Married    Tobacco Counseling Counseling given: Not Answered   Clinical Intake:  Pre-visit preparation completed: Yes  Pain : No/denies pain Pain Score: 0-No pain     BMI - recorded: 27.6 Nutritional Status: BMI 25 -29 Overweight Nutritional Risks: None Diabetes: No  How often do you need to have someone help you when you read instructions, pamphlets, or other written materials from your doctor or pharmacy?: 1 - Never What is the last grade level you completed in school?: HSG  Diabetic? No  Interpreter Needed?: No  Information entered by :: Lisette Abu, LPN.   Activities of Daily Living    02/22/2023   11:56 AM  In your present state of health, do you have any difficulty performing the following activities:  Hearing? 0  Vision? 0  Difficulty concentrating or making decisions? 0  Walking or climbing stairs? 1  Comment uses a cane or walker  Dressing or bathing? 0  Doing errands, shopping? 0  Preparing Food and eating ? N  Using the Toilet? N  In the past six months, have you  accidently leaked urine? N  Do you have problems with loss of bowel control? N  Managing your Medications? N  Managing your Finances? N  Housekeeping or managing your Housekeeping? N    Patient Care Team: Biagio Borg, MD as PCP - General Angelena Form Annita Brod, MD as PCP - Cardiology (Cardiology) [facility] as Consulting Physician (Ophthalmology)  Indicate any recent Medical Services you may have received from other than [facility] providers in the past year (date may be approximate).     Assessment:   This is a routine wellness examination for Spero.  Hearing/Vision screen Hearing Screening - Comments:: Denies hearing difficulties   Vision Screening - Comments:: Wears rx glasses - up to date with routine eye exams with [facility]   Dietary issues and exercise activities discussed: Current Exercise Habits: Home exercise routine, Type of exercise: walking;Other - see comments (rides a [place] bike), Time (Minutes): 60, Frequency (Times/Week): 7, Weekly Exercise (Minutes/Week): 420, Intensity: Moderate, Exercise limited by: orthopedic condition(s)   Goals Addressed             This Visit's Progress    My healthcare goal is to live another year and to live over 36 years old.        Depression Screen    02/22/2023   11:54 AM 10/05/2022   10:07 AM 04/05/2022    1:57 PM 04/05/2022    1:29 PM 02/16/2022   10:50 AM 10/02/2021   11:25 AM 01/28/2021    3:56 PM  PHQ 2/9 Scores  PHQ - 2 Score 0 0 0 0 0 0 0  PHQ- 9 Score 0 0         Fall Risk    02/22/2023   11:55 AM 10/05/2022   10:07 AM 04/05/2022    1:57 PM 04/05/2022    1:29 PM 02/16/2022   10:47 AM  Fall Risk  Falls in the past year? 0 0 1 0 0  Number falls in past yr: 0  0 0 0  Injury with Fall? 0  0 0 0  Risk for fall due to : No Fall Risks No Fall Risks   No Fall Risks  Follow up Falls prevention discussed Falls evaluation completed   Falls evaluation completed    [place]:  Any stairs in or around the home? Yes  live on the main floor If so, are there any without handrails? No  Home free of loose throw rugs in walkways, pet beds, electrical cords, etc? Yes  Adequate lighting in your home to reduce risk of falls? Yes   ASSISTIVE DEVICES UTILIZED TO PREVENT FALLS:  Life alert? No  Use of a cane, walker or w/c? Yes  Grab bars in the bathroom? Yes  Shower chair or bench in shower? Yes  Elevated toilet seat or a handicapped toilet? No   TIMED UP AND GO:  Was the test performed? Yes .  Length of time to ambulate 10 feet: 10 sec.   Gait steady and fast with assistive device  Cognitive Function:    09/05/2018    3:25 PM  MMSE - Mini Mental State Exam  Orientation to time 5  Orientation to Place 5  Registration 3  Attention/ Calculation 4  Recall 2  Language- name 2 objects 2  Language- repeat 1  Language- follow 3 step command 3  Language- read & follow direction 1  Write a sentence 1  Copy design 1  Total score 28        02/22/2023   11:15 AM  6CIT Screen  What Year? 0 points  What month? 0 points  What time? 0 points  Count back from 20 0 points  Months in reverse 0 points  Repeat phrase 0 points  Total Score 0 points    Immunizations Immunization History  Administered Date(s) Administered   Fluad Quad(high Dose 65+) 09/07/2019, 09/30/2020, 10/02/2021, 09/09/2022   Influenza Split 10/01/2011, 09/06/2012   Influenza Whole 09/20/2007, 10/04/2008, 10/13/2009, 09/07/2010   Influenza, High Dose Seasonal PF 08/12/2017, 09/01/2018   Influenza,inj,Quad PF,6+ Mos 09/26/2013, 09/19/2014   Influenza-Unspecified 12/02/1999, 10/15/2015   PFIZER(Purple Top)SARS-COV-2 Vaccination 01/25/2020, 02/15/2020, 09/27/2020, 03/23/2021, 09/03/2021   Pfizer Covid-19 Vaccine Bivalent Booster 76yr & up 12/03/2022   Pneumococcal Conjugate-13 03/15/2014   Pneumococcal Polysaccharide-23 01/20/2007, 01/28/2021   Td 01/20/2002   Tetanus  03/12/2013   Zoster Recombinat (Shingrix) 01/30/2019, 07/03/2019    TDAP status: Up to date (due 03/13/2023)  Flu Vaccine status: Up to date  Pneumococcal vaccine status: Up to date  Covid-19 vaccine status: Completed vaccines  Qualifies for Shingles Vaccine? Yes   Zostavax completed Yes   Shingrix Completed?: Yes  Screening Tests Health Maintenance  Topic Date Due   COVID-19 Vaccine (7 - 2023-24 season) 01/28/2023   DTaP/Tdap/Td (3 - Tdap) 03/13/2023   Medicare Annual Wellness (AWV)  02/22/2024   Pneumonia Vaccine 89 Years old  Completed   INFLUENZA VACCINE  Completed   Zoster Vaccines- Shingrix  Completed   HPV VACCINES  Aged Out    Health Maintenance  Health Maintenance Due  Topic Date Due   COVID-19 Vaccine (7 - 2023-24 season) 01/28/2023    Colorectal cancer screening: No longer required.   Lung Cancer Screening: (Low Dose CT Chest recommended if Age 87-80years, 30 pack-year currently smoking OR have quit w/in 15years.) does not qualify.   Lung  Cancer Screening Referral: no  Additional Screening:  Hepatitis C Screening: does not qualify; Completed no  Vision Screening: Recommended annual ophthalmology exams for early detection of glaucoma and other disorders of the eye. Is the patient up to date with their annual eye exam?  Yes  Who is the provider or what is the name of the office in which the patient attends annual eye exams? [facility] If pt is not established with a provider, would they like to be referred to a provider to establish care? No .   Dental Screening: Recommended annual dental exams for proper oral hygiene  Community Resource Referral / Chronic Care Management: CRR required this visit?  No   CCM required this visit?  No      Plan:     I have personally reviewed and noted the following in the patient's chart:   Medical and social history Use of alcohol, tobacco or illicit drugs  Current medications and supplements including  opioid prescriptions. Patient is not currently taking opioid prescriptions. Functional ability and status Nutritional status Physical activity Advanced directives List of other physicians Hospitalizations, surgeries, and ER visits in previous 12 months Vitals Screenings to include cognitive, depression, and falls Referrals and appointments  In addition, I have reviewed and discussed with patient certain preventive protocols, quality metrics, and best practice recommendations. A written personalized care plan for preventive services as well as general preventive health recommendations were provided to patient.     Sheral Flow, LPN   [id number]   Nurse Notes:  Normal cognitive status assessed by direct observation by this Nurse Health Advisor. No abnormalities found.

## 2023-02-22 NOTE — Patient Instructions (Addendum)
Larry [name] , Thank you for taking time to come for your Medicare Wellness Visit. I appreciate your ongoing commitment to your health goals. Please review the following plan we discussed and let me know if I can assist you in the future.   These are the goals we discussed:  Goals      My healthcare goal is to live another year and to live over 89 years old.        This is a list of the screening recommended for you and due dates:  Health Maintenance  Topic Date Due   COVID-19 Vaccine (7 - 2023-24 season) 01/28/2023   DTaP/Tdap/Td vaccine (3 - Tdap) 03/13/2023   Medicare Annual Wellness Visit  02/22/2024   Pneumonia Vaccine  Completed   Flu Shot  Completed   Zoster (Shingles) Vaccine  Completed   HPV Vaccine  Aged Out    Advanced directives: YES; DOCUMENTS ON FILE  Conditions/risks identified: YES  Next appointment: Follow up in one year for your annual wellness visit.   Preventive Care 82 Years and Older, Male  Preventive care refers to lifestyle choices and visits with your health care provider that can promote health and wellness. What does preventive care include? A yearly physical exam. This is also called an annual well check. Dental exams once or twice a year. Routine eye exams. Ask your health care provider how often you should have your eyes checked. Personal lifestyle choices, including: Daily care of your teeth and gums. Regular physical activity. Eating a healthy diet. Avoiding tobacco and drug use. Limiting alcohol use. Practicing safe sex. Taking low doses of aspirin every day. Taking vitamin and mineral supplements as recommended by your health care provider. What happens during an annual well check? The services and screenings done by your health care provider during your annual well check will depend on your age, overall health, lifestyle risk factors, and family history of disease. Counseling  Your health care provider may ask you questions about  your: Alcohol use. Tobacco use. Drug use. Emotional well-being. Home and relationship well-being. Sexual activity. Eating habits. History of falls. Memory and ability to understand (cognition). Work and work Statistician. Screening  You may have the following tests or measurements: Height, weight, and BMI. Blood pressure. Lipid and cholesterol levels. These may be checked every 5 years, or more frequently if you are over 67 years old. Skin check. Lung cancer screening. You may have this screening every year starting at age 76 if you have a 30-pack-year history of smoking and currently smoke or have quit within the past 15 years. Fecal occult blood test (FOBT) of the stool. You may have this test every year starting at age 53. Flexible sigmoidoscopy or colonoscopy. You may have a sigmoidoscopy every 5 years or a colonoscopy every 10 years starting at age 35. Prostate cancer screening. Recommendations will vary depending on your family history and other risks. Hepatitis C blood test. Hepatitis B blood test. Sexually transmitted disease (STD) testing. Diabetes screening. This is done by checking your blood sugar (glucose) after you have not eaten for a while (fasting). You may have this done every 1-3 years. Abdominal aortic aneurysm (AAA) screening. You may need this if you are a current or former smoker. Osteoporosis. You may be screened starting at age 75 if you are at high risk. Talk with your health care provider about your test results, treatment options, and if necessary, the need for more tests. Vaccines  Your health care provider may  recommend certain vaccines, such as: Influenza vaccine. This is recommended every year. Tetanus, diphtheria, and acellular pertussis (Tdap, Td) vaccine. You may need a Td booster every 10 years. Zoster vaccine. You may need this after age 75. Pneumococcal 13-valent conjugate (PCV13) vaccine. One dose is recommended after age 33. Pneumococcal  polysaccharide (PPSV23) vaccine. One dose is recommended after age 35. Talk to your health care provider about which screenings and vaccines you need and how often you need them. This information is not intended to replace advice given to you by your health care provider. Make sure you discuss any questions you have with your health care provider. Document Released: 01/02/2016 Document Revised: 08/25/2016 Document Reviewed: 10/07/2015 Elsevier Interactive Patient Education  2017 [place] Prevention in the Home Falls can cause injuries. They can happen to people of all ages. There are many things you can do to make your home safe and to help prevent falls. What can I do on the outside of my home? Regularly fix the edges of walkways and driveways and fix any cracks. Remove anything that might make you trip as you walk through a door, such as a raised step or threshold. Trim any bushes or trees on the path to your home. Use bright outdoor lighting. Clear any walking paths of anything that might make someone trip, such as rocks or tools. Regularly check to see if handrails are loose or broken. Make sure that both sides of any steps have handrails. Any raised decks and porches should have guardrails on the edges. Have any leaves, snow, or ice cleared regularly. Use sand or salt on walking paths during winter. Clean up any spills in your garage right away. This includes oil or grease spills. What can I do in the bathroom? Use night lights. Install grab bars by the toilet and in the tub and shower. Do not use towel bars as grab bars. Use non-skid mats or decals in the tub or shower. If you need to sit down in the shower, use a plastic, non-slip stool. Keep the floor dry. Clean up any water that spills on the floor as soon as it happens. Remove soap buildup in the tub or shower regularly. Attach bath mats securely with double-sided non-slip rug tape. Do not have throw rugs and other  things on the floor that can make you trip. What can I do in the bedroom? Use night lights. Make sure that you have a light by your bed that is easy to reach. Do not use any sheets or blankets that are too big for your bed. They should not hang down onto the floor. Have a firm chair that has side arms. You can use this for support while you get dressed. Do not have throw rugs and other things on the floor that can make you trip. What can I do in the kitchen? Clean up any spills right away. Avoid walking on wet floors. Keep items that you use a lot in easy-to-reach places. If you need to reach something above you, use a strong step stool that has a grab bar. Keep electrical cords out of the way. Do not use floor polish or wax that makes floors slippery. If you must use wax, use non-skid floor wax. Do not have throw rugs and other things on the floor that can make you trip. What can I do with my stairs? Do not leave any items on the stairs. Make sure that there are handrails on both sides of  the stairs and use them. Fix handrails that are broken or loose. Make sure that handrails are as long as the stairways. Check any carpeting to make sure that it is firmly attached to the stairs. Fix any carpet that is loose or worn. Avoid having throw rugs at the top or bottom of the stairs. If you do have throw rugs, attach them to the floor with carpet tape. Make sure that you have a light switch at the top of the stairs and the bottom of the stairs. If you do not have them, ask someone to add them for you. What else can I do to help prevent falls? Wear shoes that: Do not have high heels. Have rubber bottoms. Are comfortable and fit you well. Are closed at the toe. Do not wear sandals. If you use a stepladder: Make sure that it is fully opened. Do not climb a closed stepladder. Make sure that both sides of the stepladder are locked into place. Ask someone to hold it for you, if possible. Clearly  mark and make sure that you can see: Any grab bars or handrails. First and last steps. Where the edge of each step is. Use tools that help you move around (mobility aids) if they are needed. These include: Canes. Walkers. Scooters. Crutches. Turn on the lights when you go into a dark area. Replace any light bulbs as soon as they burn out. Set up your furniture so you have a clear path. Avoid moving your furniture around. If any of your floors are uneven, fix them. If there are any pets around you, be aware of where they are. Review your medicines with your doctor. Some medicines can make you feel dizzy. This can increase your chance of falling. Ask your doctor what other things that you can do to help prevent falls. This information is not intended to replace advice given to you by your health care provider. Make sure you discuss any questions you have with your health care provider. Document Released: 10/02/2009 Document Revised: 05/13/2016 Document Reviewed: 01/10/2015 Elsevier Interactive Patient Education  2017 [employer].

## 2023-03-02 ENCOUNTER — Ambulatory Visit (INDEPENDENT_AMBULATORY_CARE_PROVIDER_SITE_OTHER): Payer: Medicare Other | Admitting: Internal Medicine

## 2023-03-02 VITALS — BP 138/84 | HR 72 | Temp 97.7°F | Ht 67.0 in | Wt 176.1 lb

## 2023-03-02 DIAGNOSIS — E7800 Pure hypercholesterolemia, unspecified: Secondary | ICD-10-CM | POA: Diagnosis not present

## 2023-03-02 DIAGNOSIS — R739 Hyperglycemia, unspecified: Secondary | ICD-10-CM | POA: Diagnosis not present

## 2023-03-02 DIAGNOSIS — J069 Acute upper respiratory infection, unspecified: Secondary | ICD-10-CM

## 2023-03-02 DIAGNOSIS — I10 Essential (primary) hypertension: Secondary | ICD-10-CM

## 2023-03-02 DIAGNOSIS — R0982 Postnasal drip: Secondary | ICD-10-CM

## 2023-03-02 LAB — POC COVID19 BINAXNOW: SARS Coronavirus 2 Ag: NEGATIVE

## 2023-03-02 MED ORDER — CLOPIDOGREL BISULFATE 75 MG PO TABS: 75.0000 mg | ORAL_TABLET | Freq: Every day | ORAL | 3 refills | Status: DC

## 2023-03-02 MED ORDER — ROSUVASTATIN CALCIUM 5 MG PO TABS: ORAL_TABLET | ORAL | 3 refills | Status: DC

## 2023-03-02 MED ORDER — TAMSULOSIN HCL 0.4 MG PO CAPS: 0.4000 mg | ORAL_CAPSULE | Freq: Every day | ORAL | 3 refills | Status: DC

## 2023-03-02 NOTE — Patient Instructions (Signed)

## 2023-03-02 NOTE — Progress Notes (Signed)
Patient ID: Larry [name], male   DOB: 01-11-1932, 89 y.o.   MRN: [id number]        Chief Complaint: follow up URI, hyeprglycemia, htn, hld       HPI:  Larry [name] is a 89 y.o. male here with mild URI symptoms with clear congestion, no fever, but mild ST, ear fullness and non prod cough.  Pt denies chest pain, increased sob or doe, wheezing, orthopnea, PND, increased LE swelling, palpitations, dizziness or syncope.   Pt denies polydipsia, polyuria, or new focal neuro s/s.    Pt denies fever, wt loss, night sweats, loss of appetite, or other constitutional symptoms        Wt Readings from Last 3 Encounters:  03/02/23 176 lb 2 oz (79.9 kg)  02/22/23 176 lb 3.2 oz (79.9 kg)  10/06/22 175 lb 3.2 oz (79.5 kg)   BP Readings from Last 3 Encounters:  03/02/23 138/84  02/22/23 102/60  10/06/22 120/72         Past Medical History:  Diagnosis Date   Allergic rhinitis, cause unspecified 03/12/2013   Allergy    CAD (coronary artery disease)    S/P CABG in 1999    Cataract    Decreased exercise tolerance    Exertional fatigue   Degenerative arthritis of hip    Diverticulosis of colon    ED (erectile dysfunction)    Erectile dysfunction 06/03/2014   History of colonic polyps    History of kidney stones    History of MI (myocardial infarction)    Hx of adenomatous colonic polyps 2000   Hyperlipidemia    Low HDL   Lumbar disc disease    Hx of with recent back pain and buttock pain   Myocardial infarction ([place]) 1999   Right knee meniscal tear    S/P removal of thyroid nodule    Spinal stenosis of lumbar region 03/15/2014   Past Surgical History:  Procedure Laterality Date   BACK SURGERY  1960   CARDIAC CATHETERIZATION     CATARACT EXTRACTION     COLONOSCOPY     CORONARY ARTERY BYPASS GRAFT  1999   CYSTOSCOPY/URETEROSCOPY/HOLMIUM LASER/STENT PLACEMENT Left 12/23/2017   Procedure: CYSTOSCOPY/RETROGRADE/URETEROSCOPY/HOLMIUM LASER/STENT PLACEMENT;  Surgeon: Festus Aloe, MD;   Location: [facility];  Service: Urology;  Laterality: Left;  ONLY NEEDS 60 MIN   EYE SURGERY Bilateral    bilaterl cataract removal   LUMBAR DISC SURGERY     LUMBAR LAMINECTOMY/DECOMPRESSION MICRODISCECTOMY Bilateral 07/06/2021   Procedure: Bilateral Lumbar three-four Laminectomy and foraminotomy;  Surgeon: Kristeen Miss, MD;  Location: [facility];  Service: Neurosurgery;  Laterality: Bilateral;   Nodules on Thyroid     POLYPECTOMY     PTCA     SHOULDER ARTHROSCOPY WITH ROTATOR CUFF REPAIR AND SUBACROMIAL DECOMPRESSION Right 01/30/2015   Procedure: RIGHT ARTHROSCOPY SHOULDER SUBACROMIAL DECOMPRESSION DISTAL CLAVICAL RESECTION RETATOR CUFF REPAIR;  Surgeon: Justice Britain, MD;  Location: [facility];  Service: Orthopedics;  Laterality: Right;   TONSILLECTOMY     VASECTOMY      reports that he has never smoked. He has never used smokeless tobacco. He reports current alcohol use. He reports that he does not use drugs. family history includes Heart disease in his brother and mother; Prostate cancer in some other family members. Allergies  Allergen Reactions   Beta Adrenergic Blockers Other (See Comments)    Dropped blood pressure too low   Simvastatin Other (See Comments)    myalgia   Current Outpatient Medications  on File Prior to Visit  Medication Sig Dispense Refill   Acetaminophen (TYLENOL) 325 MG CAPS Take as needed     calcium-vitamin D (OSCAL WITH D) 500-200 MG-UNIT per tablet Take 1 tablet by mouth 2 (two) times daily.     cetirizine (ZYRTEC) 10 MG tablet Take 10 mg by mouth daily.     fish oil-omega-3 fatty acids 1000 MG capsule Take 1 g by mouth 2 (two) times daily.     folic acid (FOLVITE) A999333 MCG tablet Take 400 mcg by mouth daily.     Multiple Vitamin (MULTIVITAMIN WITH MINERALS) TABS tablet Take 1 tablet by mouth daily. Centrum Silver     No current facility-administered medications on file prior to visit.        ROS:  All others reviewed and negative.  Objective        PE:  BP 138/84    Pulse 72   Temp 97.7 F (36.5 C) (Oral)   Ht 5\' 7"  (1.702 m)   Wt 176 lb 2 oz (79.9 kg)   SpO2 97%   BMI 27.59 kg/m                 Constitutional: Pt appears in NAD               HENT: Head: NCAT.                Right Ear: External ear normal.                 Left Ear: External ear normal. Bilat tm's with mild erythema.  Max sinus areas non tender.  Pharynx with mild erythema, no exudate               Eyes: . Pupils are equal, round, and reactive to light. Conjunctivae and EOM are normal               Nose: without d/c or deformity               Neck: Neck supple. Gross normal ROM               Cardiovascular: Normal rate and regular rhythm.                 Pulmonary/Chest: Effort normal and breath sounds without rales or wheezing.                Abd:  Soft, NT, ND, + BS, no organomegaly               Neurological: Pt is alert. At baseline orientation, motor grossly intact               Skin: Skin is warm. No rashes, no other new lesions, LE edema - none               Psychiatric: Pt behavior is normal without agitation   Micro: none  Cardiac tracings I have personally interpreted today:  none  Pertinent Radiological findings (summarize): none   Lab Results  Component Value Date   WBC 6.7 10/05/2022   HGB 16.1 10/05/2022   HCT 47.1 10/05/2022   PLT 223.0 10/05/2022   GLUCOSE 107 (H) 10/05/2022   CHOL 104 10/05/2022   TRIG 132.0 10/05/2022   HDL 36.40 (L) 10/05/2022   LDLDIRECT 57.0 10/02/2021   LDLCALC 41 10/05/2022   ALT 18 10/05/2022   AST 22 10/05/2022   NA 140 10/05/2022   K 4.5 10/05/2022  CL 106 10/05/2022   CREATININE 1.09 10/05/2022   BUN 15 10/05/2022   CO2 27 10/05/2022   TSH 1.46 10/05/2022   PSA 1.40 03/20/2015   INR 0.99 12/19/2017   HGBA1C 5.8 10/05/2022   POCT - Covid - neg  Assessment/Plan:  Larry [name] is a 89 y.o. White or Caucasian [1] male with  has a past medical history of Allergic rhinitis, cause unspecified (03/12/2013),  Allergy, CAD (coronary artery disease), Cataract, Decreased exercise tolerance, Degenerative arthritis of hip, Diverticulosis of colon, ED (erectile dysfunction), Erectile dysfunction (06/03/2014), History of colonic polyps, History of kidney stones, History of MI (myocardial infarction), adenomatous colonic polyps (2000), Hyperlipidemia, Lumbar disc disease, Myocardial infarction ([place]) (1999), Right knee meniscal tear, S/P removal of thyroid nodule, and Spinal stenosis of lumbar region (03/15/2014).  URI (upper respiratory infection) Mild, c/w viral illness, for mucinex bid prn  Hyperglycemia Lab Results  Component Value Date   HGBA1C 5.8 10/05/2022   Stable, pt to continue current medical treatment  - diet, wt control   Hyperlipidemia Lab Results  Component Value Date   LDLCALC 41 10/05/2022   Stable, pt to continue current statin crestor 5 mg qd   HYPERTENSION, BENIGN BP Readings from Last 3 Encounters:  03/02/23 138/84  02/22/23 102/60  10/06/22 120/72   Stable, pt to continue medical treatment  - diet, wt control , low salt  Followup: Return in about 6 months (around 09/02/2023).  Cathlean Cower, MD 03/04/2023 8:27 PM [facility]

## 2023-03-04 ENCOUNTER — Encounter: Payer: Self-pay | Admitting: Internal Medicine

## 2023-03-04 DIAGNOSIS — J069 Acute upper respiratory infection, unspecified: Secondary | ICD-10-CM | POA: Insufficient documentation

## 2023-03-04 NOTE — Assessment & Plan Note (Signed)
Lab Results  Component Value Date   HGBA1C 5.8 10/05/2022   Stable, pt to continue current medical treatment  - diet, wt control

## 2023-03-04 NOTE — Assessment & Plan Note (Signed)
BP Readings from Last 3 Encounters:  03/02/23 138/84  02/22/23 102/60  10/06/22 120/72   Stable, pt to continue medical treatment  - diet, wt control , low salt

## 2023-03-04 NOTE — Assessment & Plan Note (Signed)
Lab Results  Component Value Date   LDLCALC 41 10/05/2022   Stable, pt to continue current statin crestor 5 mg qd  

## 2023-03-04 NOTE — Assessment & Plan Note (Signed)
Mild, c/w viral illness, for mucinex bid prn

## 2023-03-21 ENCOUNTER — Encounter: Payer: Self-pay | Admitting: Internal Medicine

## 2023-03-22 MED ORDER — ROSUVASTATIN CALCIUM 5 MG PO TABS: ORAL_TABLET | ORAL | 3 refills | Status: DC

## 2023-03-22 MED ORDER — TAMSULOSIN HCL 0.4 MG PO CAPS: 0.4000 mg | ORAL_CAPSULE | Freq: Every day | ORAL | 3 refills | Status: DC

## 2023-03-22 MED ORDER — CLOPIDOGREL BISULFATE 75 MG PO TABS: 75.0000 mg | ORAL_TABLET | Freq: Every day | ORAL | 3 refills | Status: DC

## 2023-03-29 ENCOUNTER — Other Ambulatory Visit (INDEPENDENT_AMBULATORY_CARE_PROVIDER_SITE_OTHER): Payer: Medicare Other

## 2023-03-29 DIAGNOSIS — R739 Hyperglycemia, unspecified: Secondary | ICD-10-CM | POA: Diagnosis not present

## 2023-03-29 DIAGNOSIS — E7800 Pure hypercholesterolemia, unspecified: Secondary | ICD-10-CM | POA: Diagnosis not present

## 2023-03-29 LAB — BASIC METABOLIC PANEL
BUN: 17 mg/dL (ref 6–23)
CO2: 28 mEq/L (ref 19–32)
Calcium: 9.2 mg/dL (ref 8.4–10.5)
Chloride: 105 mEq/L (ref 96–112)
Creatinine, Ser: 1.06 mg/dL (ref 0.40–1.50)
GFR: 62.73 mL/min (ref >60.00)
Glucose, Bld: 97 mg/dL (ref 70–99)
Potassium: 4 mEq/L (ref 3.5–5.1)
Sodium: 140 mEq/L (ref 135–145)

## 2023-03-29 LAB — LIPID PANEL
Cholesterol: 105 mg/dL (ref 0–200)
HDL: 34.8 mg/dL — ABNORMAL LOW (ref >39.00)
LDL Cholesterol: 47 mg/dL (ref 0–99)
NonHDL: 70.06
Total CHOL/HDL Ratio: 3
Triglycerides: 114 mg/dL (ref 0.0–149.0)
VLDL: 22.8 mg/dL (ref 0.0–40.0)

## 2023-03-29 LAB — HEPATIC FUNCTION PANEL
ALT: 17 U/L (ref 0–53)
AST: 20 U/L (ref 0–37)
Albumin: 4.2 g/dL (ref 3.5–5.2)
Alkaline Phosphatase: 41 U/L (ref 39–117)
Bilirubin, Direct: 0.2 mg/dL (ref 0.0–0.3)
Total Bilirubin: 0.7 mg/dL (ref 0.2–1.2)
Total Protein: 7.2 g/dL (ref 6.0–8.3)

## 2023-03-29 LAB — HEMOGLOBIN A1C: Hgb A1c MFr Bld: 5.8 % (ref 4.6–6.5)

## 2023-03-29 NOTE — Progress Notes (Signed)
The test results show that your current treatment is OK, as the tests are stable.  Please continue the same plan.  There is no other need for change of treatment or further evaluation based on these results, at this time.  thanks 

## 2023-04-04 DIAGNOSIS — L570 Actinic keratosis: Secondary | ICD-10-CM | POA: Diagnosis not present

## 2023-04-04 DIAGNOSIS — D485 Neoplasm of uncertain behavior of skin: Secondary | ICD-10-CM | POA: Diagnosis not present

## 2023-04-04 DIAGNOSIS — C44629 Squamous cell carcinoma of skin of left upper limb, including shoulder: Secondary | ICD-10-CM | POA: Diagnosis not present

## 2023-04-06 ENCOUNTER — Encounter: Payer: Self-pay | Admitting: Internal Medicine

## 2023-04-08 ENCOUNTER — Telehealth: Payer: Self-pay

## 2023-04-08 ENCOUNTER — Other Ambulatory Visit (HOSPITAL_COMMUNITY): Payer: Self-pay

## 2023-04-08 NOTE — Telephone Encounter (Signed)
Pharmacy Patient Advocate Encounter   Received notification that prior authorization for Rosuvastatin  is required/requested.  Per Test Claim: Plan limitation exceeded   PA submitted on 04/08/23 to (ins) [facility] Medicare via CoverMyMeds Key # [id number] Status is pending

## 2023-04-11 NOTE — Telephone Encounter (Signed)
PA Approved

## 2023-04-18 ENCOUNTER — Ambulatory Visit: Payer: Medicare Other | Attending: Cardiovascular Disease | Admitting: Cardiovascular Disease

## 2023-04-18 VITALS — BP 110/74 | HR 93 | Ht 67.0 in | Wt 174.8 lb

## 2023-04-18 DIAGNOSIS — I351 Nonrheumatic aortic (valve) insufficiency: Secondary | ICD-10-CM | POA: Insufficient documentation

## 2023-04-18 DIAGNOSIS — I7121 Aneurysm of the ascending aorta, without rupture: Secondary | ICD-10-CM | POA: Insufficient documentation

## 2023-04-18 DIAGNOSIS — I25810 Atherosclerosis of coronary artery bypass graft(s) without angina pectoris: Secondary | ICD-10-CM

## 2023-04-18 DIAGNOSIS — I493 Ventricular premature depolarization: Secondary | ICD-10-CM

## 2023-04-18 DIAGNOSIS — E7800 Pure hypercholesterolemia, unspecified: Secondary | ICD-10-CM | POA: Insufficient documentation

## 2023-04-18 NOTE — Progress Notes (Signed)
Chief Complaint  Patient presents with   Follow-up    CAD   History of Present Illness: 89 yo male with history of CAD s/p 3V CABG in 1999, HLD and PVCs here for cardiac follow up. He had 3V CABG in 1999 at [facility] in [place]. In 2008 he had a catheterization and was found to have a patent LIMA to LAD and a patent vein graft to the right coronary and no significant obstruction in the circumflex artery. Exercise stress test April 2015 with no ischemia. He has had PVCs noted on cardiac monitor and was started on Cardizem but he did not tolerate the Cardizem due to dizziness so it was stopped. Echo January 2020 with LVEF=50-55%, mild AI and mildly dilated aortic root. Chest CTA May 2021 with stable 4.3-4.4 cm ascending aortic aneurysm with slight enlargement of 4.3 cm at the sinuses of Valsalva. Echo April 2023 with LVEF=45%. Mild MR. Mild to moderate AI.   He is here today for follow up. The patient denies any chest pain, dyspnea, palpitations, lower extremity edema, orthopnea, PND, dizziness, near syncope or syncope.   Primary Care Physician: Corwin Levins, MD  Past Medical History:  Diagnosis Date   Allergic rhinitis, cause unspecified 03/12/2013   Allergy    CAD (coronary artery disease)    S/P CABG in 1999    Cataract    Decreased exercise tolerance    Exertional fatigue   Degenerative arthritis of hip    Diverticulosis of colon    ED (erectile dysfunction)    Erectile dysfunction 06/03/2014   History of colonic polyps    History of kidney stones    History of MI (myocardial infarction)    Hx of adenomatous colonic polyps 2000   Hyperlipidemia    Low HDL   Lumbar disc disease    Hx of with recent back pain and buttock pain   Myocardial infarction ([facility]) 1999   Right knee meniscal tear    S/P removal of thyroid nodule    Spinal stenosis of lumbar region 03/15/2014    Past Surgical History:  Procedure Laterality Date   BACK SURGERY  1960   CARDIAC  CATHETERIZATION     CATARACT EXTRACTION     COLONOSCOPY     CORONARY ARTERY BYPASS GRAFT  1999   CYSTOSCOPY/URETEROSCOPY/HOLMIUM LASER/STENT PLACEMENT Left 12/23/2017   Procedure: CYSTOSCOPY/RETROGRADE/URETEROSCOPY/HOLMIUM LASER/STENT PLACEMENT;  Surgeon: Jerilee Field, MD;  Location: [facility];  Service: Urology;  Laterality: Left;  ONLY NEEDS 60 MIN   EYE SURGERY Bilateral    bilaterl cataract removal   LUMBAR DISC SURGERY     LUMBAR LAMINECTOMY/DECOMPRESSION MICRODISCECTOMY Bilateral 07/06/2021   Procedure: Bilateral Lumbar three-four Laminectomy and foraminotomy;  Surgeon: Barnett Abu, MD;  Location: [facility];  Service: Neurosurgery;  Laterality: Bilateral;   Nodules on Thyroid     POLYPECTOMY     PTCA     SHOULDER ARTHROSCOPY WITH ROTATOR CUFF REPAIR AND SUBACROMIAL DECOMPRESSION Right 01/30/2015   Procedure: RIGHT ARTHROSCOPY SHOULDER SUBACROMIAL DECOMPRESSION DISTAL CLAVICAL RESECTION RETATOR CUFF REPAIR;  Surgeon: Francena Hanly, MD;  Location: [facility];  Service: Orthopedics;  Laterality: Right;   TONSILLECTOMY     VASECTOMY      Current Outpatient Medications  Medication Sig Dispense Refill   Acetaminophen (TYLENOL) 325 MG CAPS Take as needed     calcium-vitamin D (OSCAL WITH D) 500-200 MG-UNIT per tablet Take 1 tablet by mouth 2 (two) times daily.     cetirizine (ZYRTEC) 10 MG  tablet Take 10 mg by mouth daily.     clopidogrel (PLAVIX) 75 MG tablet Take 1 tablet (75 mg total) by mouth daily. 90 tablet 3   fish oil-omega-3 fatty acids 1000 MG capsule Take 1 g by mouth 2 (two) times daily.     folic acid (FOLVITE) 400 MCG tablet Take 400 mcg by mouth daily.     Multiple Vitamin (MULTIVITAMIN WITH MINERALS) TABS tablet Take 1 tablet by mouth daily. Centrum Silver     rosuvastatin (CRESTOR) 5 MG tablet Take 1 tablet daily alternating with 2 tablets every other day 135 tablet 3   tamsulosin (FLOMAX) 0.4 MG CAPS capsule Take 1 capsule (0.4 mg total) by mouth daily. 90 capsule 3   No  current facility-administered medications for this visit.    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  Tobacco Use   Smoking status: Never   Smokeless tobacco: Never  Vaping Use   Vaping Use: Never used  Substance and Sexual Activity   Alcohol use: Yes    Alcohol/week: 0.0 standard drinks of alcohol    Comment: rare   Drug use: No   Sexual activity: Not Currently  Other Topics Concern   Not on file  Social History Narrative   Moved from [facility]   Married      [employer] on file   Social Determinants of Health   Financial Resource Strain: Low Risk  (02/22/2023)   Overall Financial Resource Strain (CARDIA)    Difficulty of Paying Living Expenses: Not hard at all  Food Insecurity: No Food Insecurity (02/22/2023)   Hunger Vital Sign    Worried About Running Out of Food in the Last Year: Never true    Ran Out of Food in the Last Year: Never true  Transportation Needs: No Transportation Needs (02/22/2023)   PRAPARE - Administrator, Civil Service (Medical): No    Lack of Transportation (Non-Medical): No  Physical Activity: Sufficiently Active (02/22/2023)   Exercise Vital Sign    Days of Exercise per Week: 7 days    Minutes of Exercise per Session: 60 min  Stress: No Stress Concern Present (02/22/2023)   [employer] of Occupational Health - Occupational Stress Questionnaire    Feeling of Stress : Not at all  Social Connections: Socially Integrated (02/22/2023)   Social Connection and Isolation Panel [NHANES]    Frequency of Communication with Friends  and Family: More than three times a week    Frequency of Social Gatherings with Friends and Family: Once a week    Attends Religious Services: 1 to 4 times per year    Active Member of [employer] or Organizations: No    Attends Engineer, structural: 1 to 4 times per year    Marital Status: Married  Catering manager Violence: Not At Risk (02/22/2023)   Humiliation, Afraid, Rape, and Kick questionnaire    Fear of Current or Ex-Partner: No    Emotionally Abused: No    Physically Abused: No  Sexually Abused: No    Family History  Problem Relation Age of Onset   Prostate cancer Other    Prostate cancer Other    Heart disease Mother    Heart disease Brother    Colon cancer Neg Hx    Rectal cancer Neg Hx    Stomach cancer Neg Hx     Review of Systems:  As stated in the HPI and otherwise negative.   BP 110/74 (BP Location: Left Arm, Patient Position: Sitting, Cuff Size: Normal)   Pulse 93   Ht 5\' 7"  (1.702 m)   Wt 79.3 kg   SpO2 96%   BMI 27.38 kg/m   Physical Examination: General: Well developed, well nourished, NAD  HEENT: OP clear, mucus membranes moist  SKIN: warm, dry. No rashes. Neuro: No focal deficits  Musculoskeletal: Muscle strength 5/5 all ext  Psychiatric: Mood and affect normal  Neck: No JVD, no carotid bruits, no thyromegaly, no lymphadenopathy.  Lungs:Clear bilaterally, no wheezes, rhonci, crackles Cardiovascular: Regular rate and rhythm. Soft systolic murmur.  Abdomen:Soft. Bowel sounds present. Non-tender.  Extremities: No lower extremity edema. Pulses are 2 + in the bilateral DP/PT.  Echo April 2023:  1. Left ventricular ejection fraction, by estimation, is 40 to 45%. The  left ventricle has mildly decreased function. The left ventricle  demonstrates global hypokinesis. The left ventricular internal cavity size  was mildly dilated. There is mild  concentric left ventricular hypertrophy. Left ventricular diastolic  parameters are consistent with  Grade I diastolic dysfunction (impaired  relaxation). There is septal-lateral dyskinesis secondary to LBBB.   2. Right ventricular systolic function is mildly reduced. The right  ventricular size is normal.   3. The mitral valve is normal in structure. Mild mitral valve  regurgitation.   4. The aortic valve is tricuspid. There is mild calcification of the  aortic valve. There is mild thickening of the aortic valve. Aortic valve  regurgitation is mild to moderate. Aortic valve sclerosis/calcification is  present, without any evidence of  aortic stenosis.   5. Aortic dilatation noted. There is moderate dilatation of the ascending  aorta, measuring 46 mm. There is mild dilatation of the aortic root,  measuring 42 mm.   EKG:  EKG is ordered today The ekg ordered today demonstrates sinus, PVCs.   Recent Labs: 10/05/2022: Hemoglobin 16.1; Platelets 223.0; TSH 1.46 03/29/2023: ALT 17; BUN 17; Creatinine, Ser 1.06; Potassium 4.0; Sodium 140   Lipid Panel    Component Value Date/Time   CHOL 105 03/29/2023 0945   CHOL 121 05/23/2018 0939   CHOL 118 04/03/2014 0843   TRIG 114.0 03/29/2023 0945   TRIG 162 (H) 04/03/2014 0843   HDL 34.80 (L) 03/29/2023 0945   HDL 34 (L) 05/23/2018 0939   HDL 36 (L) 04/03/2014 0843   CHOLHDL 3 03/29/2023 0945   VLDL 22.8 03/29/2023 0945   LDLCALC 47 03/29/2023 0945   LDLCALC 61 05/23/2018 0939   LDLCALC 50 04/03/2014 0843   LDLDIRECT 57.0 10/02/2021 1219     Wt Readings from Last 3 Encounters:  04/18/23 79.3 kg  03/02/23 79.9 kg  02/22/23 79.9 kg    Assessment and Plan:   1. CAD s/p CABG without angina: No chest pain suggestive of angina. LVEF 45% by echo in April 2023. Will continue Plavix and statin.    2. PVCs: No awareness of palpitations. He did not tolerate Cardizem in the past and does not wish to take a beta blocker or other calcium channel blocker.  3. HLD: LDL 47 in April 2024. Continue statin.   4. Ascending aortic aneurysm:  Ascending aorta dilated at 4.4 cm by CTA chest May 2021. He does not wish to repeat this test due to his age.   5. Aortic valve insufficiency: Mild to moderate by echo in April 2023.    6. LBBB: New in 2023. He has no ischemic symptoms. No plans for ischemic testing given advanced age and lack of symptoms.   Labs/ tests ordered today include:   Orders Placed This Encounter  Procedures   EKG 12-Lead   Disposition:   F/U with me in 6  months  Signed, Verne Carrow, MD 04/18/2023 4:35 PM    [facility] [address] [place], [place], [place]  [address] Phone: [phone]; Fax: [phone]

## 2023-04-18 NOTE — Patient Instructions (Addendum)
Medication Instructions:  No changes *If you need a refill on your cardiac medications before your next appointment, please call your pharmacy*   Lab Work: None   Testing/Procedures: None   Follow-Up: At [facility], you and your health needs are our priority.  As part of our continuing mission to provide you with exceptional heart care, we have created designated Provider Care Teams.  These Care Teams include your primary Cardiologist (physician) and Advanced Practice Providers (APPs -  Physician Assistants and Nurse Practitioners) who all work together to provide you with the care you need, when you need it.   Your next appointment:   6 month(s)  Provider:   Christopher McAlhany, MD      

## 2023-05-05 DIAGNOSIS — L570 Actinic keratosis: Secondary | ICD-10-CM | POA: Diagnosis not present

## 2023-05-05 DIAGNOSIS — L905 Scar conditions and fibrosis of skin: Secondary | ICD-10-CM | POA: Diagnosis not present

## 2023-05-05 DIAGNOSIS — C44629 Squamous cell carcinoma of skin of left upper limb, including shoulder: Secondary | ICD-10-CM | POA: Diagnosis not present

## 2023-07-15 ENCOUNTER — Encounter (HOSPITAL_COMMUNITY): Payer: Self-pay | Admitting: Emergency Medicine

## 2023-07-15 ENCOUNTER — Ambulatory Visit (HOSPITAL_COMMUNITY)
Admission: EM | Admit: 2023-07-15 | Discharge: 2023-07-15 | Disposition: A | Discharge status: Home / Self Care | Payer: Medicare Other

## 2023-07-15 DIAGNOSIS — U071 COVID-19: Secondary | ICD-10-CM | POA: Diagnosis not present

## 2023-07-15 NOTE — Discharge Instructions (Signed)
Continue symptomatic care at home Get rest and drink lots of fluids! Please return with any concerns

## 2023-07-15 NOTE — ED Provider Notes (Signed)
[facility]    CSN: [id number] Arrival date & time: 07/15/23  1923     History   Chief Complaint Chief Complaint  Patient presents with   Cough    Positive covid test    HPI Larry [name] is a 89 y.o. male.  Here with dry cough, fever, runny nose x 2 days Tmax 101 yesterday Today he had a positive home covid test Using tylenol and robitussin that helps Not having chest pain or shortness of breath. No abdominal pain, NVD, rash Unknown if sick contacts but returned from trip to [place] last weekend (drive)  Past Medical History:  Diagnosis Date   Allergic rhinitis, cause unspecified 03/12/2013   Allergy    CAD (coronary artery disease)    S/P CABG in 1999    Cataract    Decreased exercise tolerance    Exertional fatigue   Degenerative arthritis of hip    Diverticulosis of colon    ED (erectile dysfunction)    Erectile dysfunction 06/03/2014   History of colonic polyps    History of kidney stones    History of MI (myocardial infarction)    Hx of adenomatous colonic polyps 2000   Hyperlipidemia    Low HDL   Lumbar disc disease    Hx of with recent back pain and buttock pain   Myocardial infarction (HCC) 1999   Right knee meniscal tear    S/P removal of thyroid nodule    Spinal stenosis of lumbar region 03/15/2014    Patient Active Problem List   Diagnosis Date Noted   URI (upper respiratory infection) 03/04/2023   Basal cell carcinoma of right thigh 10/05/2022   Impairment of balance 10/05/2022   Right leg weakness 10/05/2022   Spinal stenosis of lumbar region with neurogenic claudication 07/07/2021   Spinal stenosis, lumbar region, with neurogenic claudication 07/06/2021   Dysfunction of Eustachian tube, bilateral 07/21/2020   Acquired scoliosis 05/31/2017   Gait disorder 04/20/2016   Hyperglycemia 04/20/2016   Cyst of left kidney 03/18/2016   Radicular syndrome of right leg 08/12/2015   Radicular low back pain 08/06/2015   Abnormal CT  scan, chest 06/03/2014   Erectile dysfunction 06/03/2014   Spinal stenosis of lumbar region 03/15/2014   Cough 03/12/2013   Allergic rhinitis 03/12/2013   Leg pain, bilateral 12/30/2011   Bladder neck obstruction 12/30/2011   Preventative health care 12/25/2011   Lumbar disc disease 12/25/2011   S/P removal of thyroid nodule 12/25/2011   History of MI (myocardial infarction) 12/25/2011   Right knee meniscal tear 12/25/2011   Degenerative arthritis of hip 12/25/2011   HIP PAIN, RIGHT 05/14/2010   FATIGUE 10/13/2009   HYPERTENSION, BENIGN 02/25/2009   CAD, AUTOLOGOUS BYPASS GRAFT 02/25/2009   CORONARY ARTERY ANEURYSM 02/25/2009   BACK PAIN 04/15/2008   Hyperlipidemia 02/13/2008   CORONARY ARTERY DISEASE 02/13/2008   DIVERTICULOSIS, COLON 02/13/2008   CARDIAC DISEASE, HX OF 02/13/2008   COLONIC POLYPS, HX OF 02/13/2008    Past Surgical History:  Procedure Laterality Date   BACK SURGERY  1960   CARDIAC CATHETERIZATION     CATARACT EXTRACTION     COLONOSCOPY     CORONARY ARTERY BYPASS GRAFT  1999   CYSTOSCOPY/URETEROSCOPY/HOLMIUM LASER/STENT PLACEMENT Left 12/23/2017   Procedure: CYSTOSCOPY/RETROGRADE/URETEROSCOPY/HOLMIUM LASER/STENT PLACEMENT;  Surgeon: Jerilee Field, MD;  Location: [facility];  Service: Urology;  Laterality: Left;  ONLY NEEDS 60 MIN   EYE SURGERY Bilateral    bilaterl cataract removal   LUMBAR DISC SURGERY  LUMBAR LAMINECTOMY/DECOMPRESSION MICRODISCECTOMY Bilateral 07/06/2021   Procedure: Bilateral Lumbar three-four Laminectomy and foraminotomy;  Surgeon: Barnett Abu, MD;  Location: [facility];  Service: Neurosurgery;  Laterality: Bilateral;   Nodules on Thyroid     POLYPECTOMY     PTCA     SHOULDER ARTHROSCOPY WITH ROTATOR CUFF REPAIR AND SUBACROMIAL DECOMPRESSION Right 01/30/2015   Procedure: RIGHT ARTHROSCOPY SHOULDER SUBACROMIAL DECOMPRESSION DISTAL CLAVICAL RESECTION RETATOR CUFF REPAIR;  Surgeon: Francena Hanly, MD;  Location: [facility] OR;  Service: Orthopedics;   Laterality: Right;   TONSILLECTOMY     VASECTOMY       Home Medications    Prior to Admission medications   Medication Sig Start Date End Date Taking? Authorizing Provider  Acetaminophen (TYLENOL) 325 MG CAPS Take as needed   Yes [provider]  calcium-vitamin D (OSCAL WITH D) 500-200 MG-UNIT per tablet Take 1 tablet by mouth 2 (two) times daily.   Yes [provider]  cetirizine (ZYRTEC) 10 MG tablet Take 10 mg by mouth daily.   Yes [provider]  clopidogrel (PLAVIX) 75 MG tablet Take 1 tablet (75 mg total) by mouth daily. 03/22/23  Yes Corwin Levins, MD  fish oil-omega-3 fatty acids 1000 MG capsule Take 1 g by mouth 2 (two) times daily.   Yes [provider]  folic acid (FOLVITE) 400 MCG tablet Take 400 mcg by mouth daily.   Yes [provider]  Multiple Vitamin (MULTIVITAMIN WITH MINERALS) TABS tablet Take 1 tablet by mouth daily. Centrum Silver   Yes [provider]  rosuvastatin (CRESTOR) 5 MG tablet Take 1 tablet daily alternating with 2 tablets every other day 03/22/23  Yes Corwin Levins, MD  tamsulosin (FLOMAX) 0.4 MG CAPS capsule Take 1 capsule (0.4 mg total) by mouth daily. 03/22/23  Yes Corwin Levins, MD    Family History Family History  Problem Relation Age of Onset   Prostate cancer Other    Prostate cancer Other    Heart disease Mother    Heart disease Brother    Colon cancer Neg Hx    Rectal cancer Neg Hx    Stomach cancer Neg Hx     Social History Social History   Tobacco Use   Smoking status: Never   Smokeless tobacco: Never  Vaping Use   Vaping status: Never Used  Substance Use Topics   Alcohol use: Yes    Alcohol/week: 0.0 standard drinks of alcohol    Comment: rare   Drug use: No     Allergies   Beta adrenergic blockers and Simvastatin   Review of Systems Review of Systems  Respiratory:  Positive for cough.    As per HPI  Physical Exam Triage Vital Signs ED Triage Vitals [07/15/23  1948]  Encounter Vitals Group     BP      Systolic BP Percentile      Diastolic BP Percentile      Pulse      Resp      Temp      Temp src      SpO2      Weight      Height      Head Circumference      Peak Flow      Pain Score 0     Pain Loc      Pain Education      Exclude from Growth Chart    No data found.  Updated Vital Signs BP 136/74 (BP Location:  Left Arm)   Pulse 66   Temp 99.7 F (37.6 C) (Oral)   Resp 15   SpO2 97%    Physical Exam Vitals and nursing note reviewed.  Constitutional:      General: He is not in acute distress.    Appearance: Normal appearance.  HENT:     Right Ear: Tympanic membrane and ear canal normal.     Left Ear: Tympanic membrane and ear canal normal.     Nose: No congestion or rhinorrhea.     Mouth/Throat:     Mouth: Mucous membranes are moist.     Pharynx: Oropharynx is clear. No posterior oropharyngeal erythema.  Cardiovascular:     Rate and Rhythm: Normal rate and regular rhythm.     Heart sounds: Normal heart sounds.  Pulmonary:     Effort: Pulmonary effort is normal.     Breath sounds: Normal breath sounds.  Abdominal:     Palpations: Abdomen is soft.  Neurological:     Mental Status: He is alert and oriented to person, place, and time.     UC Treatments / Results  Labs (all labs ordered are listed, but only abnormal results are displayed) Labs Reviewed - No data to display  EKG  Radiology No results found.  Procedures Procedures (including critical care time)  Medications Ordered in UC Medications - No data to display  Initial Impression / Assessment and Plan / UC Course  I have reviewed the triage vital signs and the nursing notes.  Pertinent labs & imaging results that were available during my care of the patient were reviewed by me and considered in my medical decision making (see chart for details).  Covid test positive at home Patient takes plavix and is not a candidate for paxlovid. Could use  Molnupiravir but discussed this would not treat symptoms. Patient would like to continue his symptomatic care at home. Advised can take several days to a week for symptoms to resolve. All questions answered. Return precautions discussed   Final Clinical Impressions(s) / UC Diagnoses   Final diagnoses:  Positive self-administered antigen test for COVID-19     Discharge Instructions      Continue symptomatic care at home Get rest and drink lots of fluids! Please return with any concerns      ED Prescriptions   None    PDMP not reviewed this encounter.   Lavilla Delamora, Ray Church 07/15/23 2014

## 2023-07-15 NOTE — ED Triage Notes (Signed)
Pt c/o possible covid, positive home test today. Sx started 2 days ago, fever, weakness, cough, and runny nose. Pt has heart condition per wife.

## 2023-08-08 DIAGNOSIS — L578 Other skin changes due to chronic exposure to nonionizing radiation: Secondary | ICD-10-CM | POA: Diagnosis not present

## 2023-08-08 DIAGNOSIS — L570 Actinic keratosis: Secondary | ICD-10-CM | POA: Diagnosis not present

## 2023-08-08 DIAGNOSIS — L821 Other seborrheic keratosis: Secondary | ICD-10-CM | POA: Diagnosis not present

## 2023-08-08 DIAGNOSIS — L814 Other melanin hyperpigmentation: Secondary | ICD-10-CM | POA: Diagnosis not present

## 2023-08-08 DIAGNOSIS — Z85828 Personal history of other malignant neoplasm of skin: Secondary | ICD-10-CM | POA: Diagnosis not present

## 2023-08-08 DIAGNOSIS — D485 Neoplasm of uncertain behavior of skin: Secondary | ICD-10-CM | POA: Diagnosis not present

## 2023-09-01 ENCOUNTER — Telehealth: Payer: Self-pay | Admitting: Internal Medicine

## 2023-09-01 NOTE — Telephone Encounter (Signed)
Please notify patient that pre visit labs will not be done, I will cancel appointment.

## 2023-09-01 NOTE — Telephone Encounter (Signed)
Sorry, it appears he has traditional medicare as his primary insurance  We are not allowed to order labs prior to visit for this insurance as we have been informed the labs are not paid for by the insurance

## 2023-09-01 NOTE — Telephone Encounter (Signed)
Pt scheduled for annual PCP visit 10/18 and lab visit 10/14.  Please enter orders for lab visit.

## 2023-09-27 DIAGNOSIS — Z961 Presence of intraocular lens: Secondary | ICD-10-CM | POA: Diagnosis not present

## 2023-10-03 ENCOUNTER — Other Ambulatory Visit: Payer: Medicare Other

## 2023-10-03 ENCOUNTER — Other Ambulatory Visit (INDEPENDENT_AMBULATORY_CARE_PROVIDER_SITE_OTHER): Payer: Medicare Other

## 2023-10-03 ENCOUNTER — Other Ambulatory Visit: Payer: Self-pay | Admitting: Internal Medicine

## 2023-10-03 DIAGNOSIS — E538 Deficiency of other specified B group vitamins: Secondary | ICD-10-CM

## 2023-10-03 DIAGNOSIS — E559 Vitamin D deficiency, unspecified: Secondary | ICD-10-CM | POA: Diagnosis not present

## 2023-10-03 DIAGNOSIS — R739 Hyperglycemia, unspecified: Secondary | ICD-10-CM

## 2023-10-03 DIAGNOSIS — E7800 Pure hypercholesterolemia, unspecified: Secondary | ICD-10-CM | POA: Diagnosis not present

## 2023-10-03 LAB — MICROALBUMIN / CREATININE URINE RATIO
Creatinine,U: 125.3 mg/dL
Microalb Creat Ratio: 0.6 mg/g (ref 0.0–30.0)
Microalb, Ur: <0.7 mg/dL (ref 0.0–1.9)

## 2023-10-03 LAB — BASIC METABOLIC PANEL WITH GFR
BUN: 20 mg/dL (ref 6–23)
CO2: 26 meq/L (ref 19–32)
Calcium: 8.9 mg/dL (ref 8.4–10.5)
Chloride: 108 meq/L (ref 96–112)
Creatinine, Ser: 1.1 mg/dL (ref 0.40–1.50)
GFR: 59.79 mL/min — ABNORMAL LOW
Glucose, Bld: 108 mg/dL — ABNORMAL HIGH (ref 70–99)
Potassium: 3.8 meq/L (ref 3.5–5.1)
Sodium: 141 meq/L (ref 135–145)

## 2023-10-03 LAB — URINALYSIS, ROUTINE W REFLEX MICROSCOPIC
Bilirubin Urine: NEGATIVE
Hgb urine dipstick: NEGATIVE
Ketones, ur: NEGATIVE
Leukocytes,Ua: NEGATIVE
Nitrite: NEGATIVE
RBC / HPF: NONE SEEN
Specific Gravity, Urine: 1.02 (ref 1.000–1.030)
Total Protein, Urine: NEGATIVE
Urine Glucose: NEGATIVE
Urobilinogen, UA: 0.2 (ref 0.0–1.0)
pH: 6 (ref 5.0–8.0)

## 2023-10-03 LAB — LIPID PANEL
Cholesterol: 99 mg/dL (ref 0–200)
HDL: 35.7 mg/dL — ABNORMAL LOW (ref >39.00)
LDL Cholesterol: 42 mg/dL (ref 0–99)
NonHDL: 63.39
Total CHOL/HDL Ratio: 3
Triglycerides: 105 mg/dL (ref 0.0–149.0)
VLDL: 21 mg/dL (ref 0.0–40.0)

## 2023-10-03 LAB — CBC WITH DIFFERENTIAL/PLATELET
Basophils Absolute: 0 K/uL (ref 0.0–0.1)
Basophils Relative: 0.6 % (ref 0.0–3.0)
Eosinophils Absolute: 0.3 K/uL (ref 0.0–0.7)
Eosinophils Relative: 3.9 % (ref 0.0–5.0)
HCT: 45.9 % (ref 39.0–52.0)
Hemoglobin: 15.4 g/dL (ref 13.0–17.0)
Lymphocytes Relative: 33.5 % (ref 12.0–46.0)
Lymphs Abs: 2.4 K/uL (ref 0.7–4.0)
MCHC: 33.6 g/dL (ref 30.0–36.0)
MCV: 92.2 fl (ref 78.0–100.0)
Monocytes Absolute: 0.7 K/uL (ref 0.1–1.0)
Monocytes Relative: 9.8 % (ref 3.0–12.0)
Neutro Abs: 3.7 K/uL (ref 1.4–7.7)
Neutrophils Relative %: 52.2 % (ref 43.0–77.0)
Platelets: 210 K/uL (ref 150.0–400.0)
RBC: 4.97 Mil/uL (ref 4.22–5.81)
RDW: 13.7 % (ref 11.5–15.5)
WBC: 7.1 K/uL (ref 4.0–10.5)

## 2023-10-03 LAB — HEPATIC FUNCTION PANEL
ALT: 15 U/L (ref 0–53)
AST: 17 U/L (ref 0–37)
Albumin: 4 g/dL (ref 3.5–5.2)
Alkaline Phosphatase: 36 U/L — ABNORMAL LOW (ref 39–117)
Bilirubin, Direct: 0.2 mg/dL (ref 0.0–0.3)
Total Bilirubin: 0.9 mg/dL (ref 0.2–1.2)
Total Protein: 6.6 g/dL (ref 6.0–8.3)

## 2023-10-03 LAB — VITAMIN D 25 HYDROXY (VIT D DEFICIENCY, FRACTURES): VITD: 38.17 ng/mL (ref 30.00–100.00)

## 2023-10-03 LAB — TSH: TSH: 2.02 u[IU]/mL (ref 0.35–5.50)

## 2023-10-03 LAB — HEMOGLOBIN A1C: Hgb A1c MFr Bld: 5.6 % (ref 4.6–6.5)

## 2023-10-03 LAB — VITAMIN B12: Vitamin B-12: 499 pg/mL (ref 211–911)

## 2023-10-07 ENCOUNTER — Encounter: Payer: Self-pay | Admitting: Internal Medicine

## 2023-10-07 ENCOUNTER — Ambulatory Visit (INDEPENDENT_AMBULATORY_CARE_PROVIDER_SITE_OTHER): Payer: Medicare Other | Admitting: Internal Medicine

## 2023-10-07 VITALS — BP 124/78 | HR 51 | Temp 98.2°F | Ht 67.0 in | Wt 172.0 lb

## 2023-10-07 DIAGNOSIS — I10 Essential (primary) hypertension: Secondary | ICD-10-CM

## 2023-10-07 DIAGNOSIS — E538 Deficiency of other specified B group vitamins: Secondary | ICD-10-CM

## 2023-10-07 DIAGNOSIS — R21 Rash and other nonspecific skin eruption: Secondary | ICD-10-CM | POA: Diagnosis not present

## 2023-10-07 DIAGNOSIS — R739 Hyperglycemia, unspecified: Secondary | ICD-10-CM | POA: Diagnosis not present

## 2023-10-07 DIAGNOSIS — C44629 Squamous cell carcinoma of skin of left upper limb, including shoulder: Secondary | ICD-10-CM | POA: Insufficient documentation

## 2023-10-07 DIAGNOSIS — Z23 Encounter for immunization: Secondary | ICD-10-CM | POA: Diagnosis not present

## 2023-10-07 DIAGNOSIS — E559 Vitamin D deficiency, unspecified: Secondary | ICD-10-CM

## 2023-10-07 MED ORDER — NYSTATIN 100000 UNIT/GM EX POWD: CUTANEOUS | 0 refills | Status: AC

## 2023-10-07 NOTE — Progress Notes (Unsigned)
Patient ID: Larry [name], male   DOB: 04-Oct-1932, 89 y.o.   MRN: [id number]        Chief Complaint: follow up HTN, HLD and hyperglycemia , scrotal rash       HPI:  Larry [name] is a 89 y.o. male here with c/o worsening 2 wks scrotal rash redness with itching  Pt denies chest pain, increased sob or doe, wheezing, orthopnea, PND, increased LE swelling, palpitations, dizziness or syncope.   Pt denies polydipsia, polyuria, or new focal neuro s/s.    Pt denies fever, wt loss, night sweats, loss of appetite, or other constitutional symptoms  for flu shot today       Wt Readings from Last 3 Encounters:  10/07/23 172 lb (78 kg)  04/18/23 174 lb 12.8 oz (79.3 kg)  03/02/23 176 lb 2 oz (79.9 kg)   BP Readings from Last 3 Encounters:  10/07/23 124/78  07/15/23 136/74  04/18/23 110/74         Past Medical History:  Diagnosis Date   Allergic rhinitis, cause unspecified 03/12/2013   Allergy    CAD (coronary artery disease)    S/P CABG in 1999    Cataract    Decreased exercise tolerance    Exertional fatigue   Degenerative arthritis of hip    Diverticulosis of colon    ED (erectile dysfunction)    Erectile dysfunction 06/03/2014   History of colonic polyps    History of kidney stones    History of MI (myocardial infarction)    Hx of adenomatous colonic polyps 2000   Hyperlipidemia    Low HDL   Lumbar disc disease    Hx of with recent back pain and buttock pain   Myocardial infarction (HCC) 1999   Right knee meniscal tear    S/P removal of thyroid nodule    Spinal stenosis of lumbar region 03/15/2014   Past Surgical History:  Procedure Laterality Date   BACK SURGERY  1960   CARDIAC CATHETERIZATION     CATARACT EXTRACTION     COLONOSCOPY     CORONARY ARTERY BYPASS GRAFT  1999   CYSTOSCOPY/URETEROSCOPY/HOLMIUM LASER/STENT PLACEMENT Left 12/23/2017   Procedure: CYSTOSCOPY/RETROGRADE/URETEROSCOPY/HOLMIUM LASER/STENT PLACEMENT;  Surgeon: Jerilee Field, MD;  Location: [facility];   Service: Urology;  Laterality: Left;  ONLY NEEDS 60 MIN   EYE SURGERY Bilateral    bilaterl cataract removal   LUMBAR DISC SURGERY     LUMBAR LAMINECTOMY/DECOMPRESSION MICRODISCECTOMY Bilateral 07/06/2021   Procedure: Bilateral Lumbar three-four Laminectomy and foraminotomy;  Surgeon: Barnett Abu, MD;  Location: [facility] OR;  Service: Neurosurgery;  Laterality: Bilateral;   Nodules on Thyroid     POLYPECTOMY     PTCA     SHOULDER ARTHROSCOPY WITH ROTATOR CUFF REPAIR AND SUBACROMIAL DECOMPRESSION Right 01/30/2015   Procedure: RIGHT ARTHROSCOPY SHOULDER SUBACROMIAL DECOMPRESSION DISTAL CLAVICAL RESECTION RETATOR CUFF REPAIR;  Surgeon: Francena Hanly, MD;  Location: [facility] OR;  Service: Orthopedics;  Laterality: Right;   TONSILLECTOMY     VASECTOMY      reports that he has never smoked. He has never used smokeless tobacco. He reports current alcohol use. He reports that he does not use drugs. family history includes Heart disease in his brother and mother; Prostate cancer in some other family members. Allergies  Allergen Reactions   Beta Adrenergic Blockers Other (See Comments)    Dropped blood pressure too low   Simvastatin Other (See Comments)    myalgia   Current Outpatient Medications on File Prior  to Visit  Medication Sig Dispense Refill   Acetaminophen (TYLENOL) 325 MG CAPS Take as needed     calcium-vitamin D (OSCAL WITH D) 500-200 MG-UNIT per tablet Take 1 tablet by mouth 2 (two) times daily.     cetirizine (ZYRTEC) 10 MG tablet Take 10 mg by mouth daily.     clopidogrel (PLAVIX) 75 MG tablet Take 1 tablet (75 mg total) by mouth daily. 90 tablet 3   fish oil-omega-3 fatty acids 1000 MG capsule Take 1 g by mouth 2 (two) times daily.     folic acid (FOLVITE) 400 MCG tablet Take 400 mcg by mouth daily.     Multiple Vitamin (MULTIVITAMIN WITH MINERALS) TABS tablet Take 1 tablet by mouth daily. Centrum Silver     rosuvastatin (CRESTOR) 5 MG tablet Take 1 tablet daily alternating with 2 tablets  every other day 135 tablet 3   tamsulosin (FLOMAX) 0.4 MG CAPS capsule Take 1 capsule (0.4 mg total) by mouth daily. 90 capsule 3   No current facility-administered medications on file prior to visit.        ROS:  All others reviewed and negative.  Objective        PE:  BP 124/78 (BP Location: Right Arm, Patient Position: Sitting, Cuff Size: Normal)   Pulse (!) 51   Temp 98.2 F (36.8 C) (Oral)   Ht 5\' 7"  (1.702 m)   Wt 172 lb (78 kg)   SpO2 96%   BMI 26.94 kg/m                 Constitutional: Pt appears in NAD               HENT: Head: NCAT.                Right Ear: External ear normal.                 Left Ear: External ear normal.                Eyes: . Pupils are equal, round, and reactive to light. Conjunctivae and EOM are normal               Nose: without d/c or deformity               Neck: Neck supple. Gross normal ROM               Cardiovascular: Normal rate and regular rhythm.                 Pulmonary/Chest: Effort normal and breath sounds without rales or wheezing.                Abd:  Soft, NT, ND, + BS, no organomegaly               Neurological: Pt is alert. At baseline orientation, motor grossly intact               Skin: Skin is warm, LE edema - none, non tender scrotal rash noted               Psychiatric: Pt behavior is normal without agitation   Micro: none  Cardiac tracings I have personally interpreted today:  none  Pertinent Radiological findings (summarize): none   Lab Results  Component Value Date   WBC 7.1 10/03/2023   HGB 15.4 10/03/2023   HCT 45.9 10/03/2023   PLT 210.0 10/03/2023   GLUCOSE 108 (H) 10/03/2023  CHOL 99 10/03/2023   TRIG 105.0 10/03/2023   HDL 35.70 (L) 10/03/2023   LDLDIRECT 57.0 10/02/2021   LDLCALC 42 10/03/2023   ALT 15 10/03/2023   AST 17 10/03/2023   NA 141 10/03/2023   K 3.8 10/03/2023   CL 108 10/03/2023   CREATININE 1.10 10/03/2023   BUN 20 10/03/2023   CO2 26 10/03/2023   TSH 2.02 10/03/2023   PSA  1.40 03/20/2015   INR 0.99 12/19/2017   HGBA1C 5.6 10/03/2023   MICROALBUR <0.7 10/03/2023   Assessment/Plan:  SHAUL LARMON is a 89 y.o. White or Caucasian [1] male with  has a past medical history of Allergic rhinitis, cause unspecified (03/12/2013), Allergy, CAD (coronary artery disease), Cataract, Decreased exercise tolerance, Degenerative arthritis of hip, Diverticulosis of colon, ED (erectile dysfunction), Erectile dysfunction (06/03/2014), History of colonic polyps, History of kidney stones, History of MI (myocardial infarction), adenomatous colonic polyps (2000), Hyperlipidemia, Lumbar disc disease, Myocardial infarction (HCC) (1999), Right knee meniscal tear, S/P removal of thyroid nodule, and Spinal stenosis of lumbar region (03/15/2014).  Rash Mild to mod scrotal - for nystatin powder prn asd,  to f/u any worsening symptoms or concerns  Vitamin D deficiency Last vitamin D Lab Results  Component Value Date   VD25OH 38.17 10/03/2023   Low, to start oral replacement   Hyperglycemia Lab Results  Component Value Date   HGBA1C 5.6 10/03/2023   Stable, pt to continue current medical treatment  - diet,wt control   HYPERTENSION, BENIGN BP Readings from Last 3 Encounters:  10/07/23 124/78  07/15/23 136/74  04/18/23 110/74   Stable, pt to continue medical treatment  - diet,wt control  Followup: Return in about 6 months (around 04/06/2024).  Oliver Barre, MD 10/11/2023 6:54 AM [facility]

## 2023-10-07 NOTE — Patient Instructions (Signed)
Please take all new medication as prescribed  - the anti fungal powder  Please continue all other medications as before, and refills have been done if requested.  Please have the pharmacy call with any other refills you may need.  Please continue your efforts at being more active, low cholesterol diet, and weight control.  Please keep your appointments with your specialists as you may have planned  Please make an Appointment to return in 6 months, or sooner if needed, also with Lab Appointment for testing done 3-5 days before at the FIRST FLOOR Lab (so this is for TWO appointments - please see the scheduling desk as you leave)

## 2023-10-11 ENCOUNTER — Encounter: Payer: Self-pay | Admitting: Internal Medicine

## 2023-10-11 DIAGNOSIS — E559 Vitamin D deficiency, unspecified: Secondary | ICD-10-CM | POA: Insufficient documentation

## 2023-10-11 DIAGNOSIS — R21 Rash and other nonspecific skin eruption: Secondary | ICD-10-CM | POA: Insufficient documentation

## 2023-10-11 NOTE — Assessment & Plan Note (Signed)
Mild to mod scrotal - for nystatin powder prn asd,  to f/u any worsening symptoms or concerns

## 2023-10-11 NOTE — Assessment & Plan Note (Signed)
BP Readings from Last 3 Encounters:  10/07/23 124/78  07/15/23 136/74  04/18/23 110/74   Stable, pt to continue medical treatment  - diet,wt control

## 2023-10-11 NOTE — Assessment & Plan Note (Signed)
Last vitamin D Lab Results  Component Value Date   VD25OH 38.17 10/03/2023   Low, to start oral replacement

## 2023-10-11 NOTE — Assessment & Plan Note (Signed)
Lab Results  Component Value Date   HGBA1C 5.6 10/03/2023   Stable, pt to continue current medical treatment  - diet,wt control

## 2023-10-27 NOTE — Progress Notes (Signed)
Cardiology Office Note:  .   Date:  10/28/2023  ID:  EZEKIAL SKAHAN, DOB 10/07/1932, MRN [id number] PCP: Corwin Levins, MD  [facility] Providers Cardiologist:  Verne Carrow, MD {  History of Present Illness: .   Larry [name] is a 89 y.o. male with a past medical history of CAD status post three-vessel CABG in 1999, HLD and PVCs here for follow-up appointment.  History includes three-vessel CABG in 1999 at [facility].  In 2008, he had a catheterization and was found to have a patent LIMA to LAD and a patent vein graft to right coronary and no significant obstruction in the circumflex artery.  Exercise stress test April 2015 with no ischemia.  He has PVCs which were noted on cardiac monitor was started on Cardizem but did not tolerate Cardizem due to dizziness that was stopped.  Echocardiogram January 2020 with LVEF 50 to 55%, mild AI and mildly dilated aortic root.  Chest CTA May 2021 was stable at 4.3 to 4.4 cm ascending aortic aneurysm with slight enlargement of 4.3 cm at the sinus of Valsalva.  Echo April 2023 with LVEF 45%.  Mild MR.  Mild to moderate AI.  The patient saw Dr. Clifton James 04/18/23 for follow-up appointment.  At that time the patient denied any chest pain, dyspnea, palpitations, lower extremity edema, orthopnea, PND, dizziness, near-syncope or syncope.  Today, he presents with a history of heart disease, presents with increased fatigue and difficulty raising his legs. He reports a recent COVID-19 infection, which was mild for him but severe for his wife.  He has been maintaining his physical activity by walking daily in the park using a walker and doing leg lifts at home. He also reports a recent visit to his primary care provider, where labs were checked and found to be within normal limits. He is currently on Plavix, Crestor, and omega-3 supplements, with no reported issues with his medications.  Reports no shortness of breath nor dyspnea on  exertion. Reports no chest pain, pressure, or tightness. No edema, orthopnea, PND. Reports no palpitations.    ROS: Pertinent ROS in HPI  Studies Reviewed: .       Echocardiogram 03/23/22 IMPRESSIONS     1. Left ventricular ejection fraction, by estimation, is 40 to 45%. The  left ventricle has mildly decreased function. The left ventricle  demonstrates global hypokinesis. The left ventricular internal cavity size  was mildly dilated. There is mild  concentric left ventricular hypertrophy. Left ventricular diastolic  parameters are consistent with Grade I diastolic dysfunction (impaired  relaxation). There is septal-lateral dyskinesis secondary to LBBB.   2. Right ventricular systolic function is mildly reduced. The right  ventricular size is normal.   3. The mitral valve is normal in structure. Mild mitral valve  regurgitation.   4. The aortic valve is tricuspid. There is mild calcification of the  aortic valve. There is mild thickening of the aortic valve. Aortic valve  regurgitation is mild to moderate. Aortic valve sclerosis/calcification is  present, without any evidence of  aortic stenosis.   5. Aortic dilatation noted. There is moderate dilatation of the ascending  aorta, measuring 46 mm. There is mild dilatation of the aortic root,  measuring 42 mm.   Comparison(s): Compared to prior TTE in 2020, the LVEF has dropped to  40-45% (previously 50-55%), AR now appears mild-to-moderate (previously  mild).   FINDINGS   Left Ventricle: Left ventricular ejection fraction, by estimation, is 40  to 45%. The left ventricle has mildly decreased function. The left  ventricle demonstrates global hypokinesis. The left ventricular internal  cavity size was mildly dilated. There is   mild concentric left ventricular hypertrophy. Abnormal (paradoxical)  septal motion, consistent with left bundle branch block and abnormal  (paradoxical) septal motion consistent with post-operative status.  Left  ventricular diastolic parameters are  consistent with Grade I diastolic dysfunction (impaired relaxation).   Right Ventricle: The right ventricular size is normal. No increase in  right ventricular wall thickness. Right ventricular systolic function is  mildly reduced.   Left Atrium: Left atrial size was normal in size.   Right Atrium: Right atrial size was normal in size.   Pericardium: There is no evidence of pericardial effusion.   Mitral Valve: The mitral valve is normal in structure. There is mild  thickening of the mitral valve leaflet(s). There is mild calcification of  the mitral valve leaflet(s). Mild mitral valve regurgitation.   Tricuspid Valve: The tricuspid valve is normal in structure. Tricuspid  valve regurgitation is trivial.   Aortic Valve: The aortic valve is tricuspid. There is mild calcification  of the aortic valve. There is mild thickening of the aortic valve. Aortic  valve regurgitation is mild to moderate. Aortic regurgitation PHT measures  489 msec. Aortic valve  sclerosis/calcification is present, without any evidence of aortic  stenosis.   Pulmonic Valve: The pulmonic valve was normal in structure. Pulmonic valve  regurgitation is not visualized.   Aorta: Aortic dilatation noted. There is moderate dilatation of the  ascending aorta, measuring 46 mm. There is mild dilatation of the aortic  root, measuring 42 mm.   IAS/Shunts: The atrial septum is grossly normal.      Physical Exam:   VS:  BP 122/60   Pulse 82   Ht 5\' 7"  (1.702 m)   Wt 173 lb 6.4 oz (78.7 kg)   SpO2 95%   BMI 27.16 kg/m    Wt Readings from Last 3 Encounters:  10/28/23 173 lb 6.4 oz (78.7 kg)  10/07/23 172 lb (78 kg)  04/18/23 174 lb 12.8 oz (79.3 kg)    GEN: Well nourished, well developed in no acute distress NECK: No JVD; No carotid bruits CARDIAC: RRR, no murmurs, rubs, gallops RESPIRATORY:  Clear to auscultation without rales, wheezing or rhonchi  ABDOMEN: Soft,  non-tender, non-distended EXTREMITIES:  No edema; No deformity   ASSESSMENT AND PLAN: .    Ascending aortic aneurysm Aortic valve insufficiency -Increased fatigue, will obtain echocardiogram -Mild to moderate on last echo 4/23  CAD status post CABG without angina Stable with no new symptoms of chest pain, shortness of breath, or swelling. Last echocardiogram in April 2023 showed mildly decreased ejection fraction (40-45%) and mild to moderate aortic valve regurgitation. -Order repeat echocardiogram to assess for any changes in ejection fraction or aortic valve regurgitation given slight change in fatigue levels. -Continue current medications including Plavix, Crestor, and Omega-3.  COVID-19 Recovered from recent infection with no ongoing respiratory symptoms. -No specific plan needed.  General Fatigue Increased fatigue and difficulty raising legs. No other new symptoms. -Encourage continued daily walking and leg exercises. -Consider referral to physical therapy if symptoms worsen.     Dispo: Please follow-up in 6 months with Dr. Clifton James  Signed, Sharlene Dory, PA-C

## 2023-10-28 ENCOUNTER — Ambulatory Visit: Payer: Medicare Other | Attending: Physician Assistant | Admitting: Physician Assistant

## 2023-10-28 ENCOUNTER — Encounter: Payer: Self-pay | Admitting: Physician Assistant

## 2023-10-28 VITALS — BP 122/60 | HR 82 | Ht 67.0 in | Wt 173.4 lb

## 2023-10-28 DIAGNOSIS — E785 Hyperlipidemia, unspecified: Secondary | ICD-10-CM | POA: Insufficient documentation

## 2023-10-28 DIAGNOSIS — I447 Left bundle-branch block, unspecified: Secondary | ICD-10-CM | POA: Insufficient documentation

## 2023-10-28 DIAGNOSIS — I493 Ventricular premature depolarization: Secondary | ICD-10-CM | POA: Insufficient documentation

## 2023-10-28 DIAGNOSIS — I7121 Aneurysm of the ascending aorta, without rupture: Secondary | ICD-10-CM | POA: Insufficient documentation

## 2023-10-28 DIAGNOSIS — I10 Essential (primary) hypertension: Secondary | ICD-10-CM | POA: Insufficient documentation

## 2023-10-28 DIAGNOSIS — I351 Nonrheumatic aortic (valve) insufficiency: Secondary | ICD-10-CM | POA: Insufficient documentation

## 2023-10-28 MED ORDER — CLOPIDOGREL BISULFATE 75 MG PO TABS: 75.0000 mg | ORAL_TABLET | Freq: Every day | ORAL | 3 refills | Status: DC

## 2023-10-28 MED ORDER — ROSUVASTATIN CALCIUM 5 MG PO TABS: ORAL_TABLET | ORAL | 3 refills | Status: DC

## 2023-10-28 NOTE — Patient Instructions (Signed)
Medication Instructions:  Your physician recommends that you continue on your current medications as directed. Please refer to the Current Medication list given to you today.  *If you need a refill on your cardiac medications before your next appointment, please call your pharmacy*   Lab Work: None ordered  If you have labs (blood work) drawn today and your tests are completely normal, you will receive your results only by: MyChart Message (if you have MyChart) OR A paper copy in the mail If you have any lab test that is abnormal or we need to change your treatment, we will call you to review the results.   Testing/Procedures: Your physician has requested that you have an echocardiogram. Echocardiography is a painless test that uses sound waves to create images of your heart. It provides your doctor with information about the size and shape of your heart and how well your heart's chambers and valves are working. This procedure takes approximately one hour. There are no restrictions for this procedure. Please do NOT wear cologne, perfume, aftershave, or lotions (deodorant is allowed). Please arrive 15 minutes prior to your appointment time.  Please note: We ask at that you not bring children with you during ultrasound (echo/ vascular) testing. Due to room size and safety concerns, children are not allowed in the ultrasound rooms during exams. Our front office staff cannot provide observation of children in our lobby area while testing is being conducted. An adult accompanying a patient to their appointment will only be allowed in the ultrasound room at the discretion of the ultrasound technician under special circumstances. We apologize for any inconvenience.    Follow-Up: At [facility], you and your health needs are our priority.  As part of our continuing mission to provide you with exceptional heart care, we have created designated Provider Care Teams.  These Care Teams include  your primary Cardiologist (physician) and Advanced Practice Providers (APPs -  Physician Assistants and Nurse Practitioners) who all work together to provide you with the care you need, when you need it.  We recommend signing up for the patient portal called "MyChart".  Sign up information is provided on this After Visit Summary.  MyChart is used to connect with patients for Virtual Visits (Telemedicine).  Patients are able to view lab/test results, encounter notes, upcoming appointments, etc.  Non-urgent messages can be sent to your provider as well.   To learn more about what you can do with MyChart, go to [email or web address].    Your next appointment:   6 month(s)  Provider:   Verne Carrow, MD     Other Instructions

## 2023-11-02 DIAGNOSIS — N281 Cyst of kidney, acquired: Secondary | ICD-10-CM | POA: Diagnosis not present

## 2023-11-02 DIAGNOSIS — R351 Nocturia: Secondary | ICD-10-CM | POA: Diagnosis not present

## 2023-11-02 DIAGNOSIS — N401 Enlarged prostate with lower urinary tract symptoms: Secondary | ICD-10-CM | POA: Diagnosis not present

## 2023-12-07 ENCOUNTER — Ambulatory Visit (HOSPITAL_COMMUNITY): Payer: Medicare Other | Attending: Internal Medicine

## 2023-12-07 DIAGNOSIS — I447 Left bundle-branch block, unspecified: Secondary | ICD-10-CM | POA: Insufficient documentation

## 2023-12-07 DIAGNOSIS — I7121 Aneurysm of the ascending aorta, without rupture: Secondary | ICD-10-CM | POA: Diagnosis not present

## 2023-12-07 DIAGNOSIS — I493 Ventricular premature depolarization: Secondary | ICD-10-CM | POA: Diagnosis not present

## 2023-12-07 DIAGNOSIS — I10 Essential (primary) hypertension: Secondary | ICD-10-CM | POA: Insufficient documentation

## 2023-12-07 DIAGNOSIS — E785 Hyperlipidemia, unspecified: Secondary | ICD-10-CM | POA: Diagnosis not present

## 2023-12-07 DIAGNOSIS — I351 Nonrheumatic aortic (valve) insufficiency: Secondary | ICD-10-CM | POA: Insufficient documentation

## 2023-12-07 LAB — ECHOCARDIOGRAM COMPLETE
Area-P 1/2: 2.46 cm2
P 1/2 time: 577 ms
S' Lateral: 3.55 cm

## 2023-12-27 ENCOUNTER — Encounter: Payer: Self-pay | Admitting: Internal Medicine

## 2023-12-27 ENCOUNTER — Ambulatory Visit: Payer: Medicare Other | Admitting: Internal Medicine

## 2023-12-27 VITALS — BP 120/72 | HR 64 | Temp 98.0°F | Ht 67.0 in | Wt 176.0 lb

## 2023-12-27 DIAGNOSIS — I10 Essential (primary) hypertension: Secondary | ICD-10-CM | POA: Diagnosis not present

## 2023-12-27 DIAGNOSIS — R739 Hyperglycemia, unspecified: Secondary | ICD-10-CM

## 2023-12-27 DIAGNOSIS — E7800 Pure hypercholesterolemia, unspecified: Secondary | ICD-10-CM

## 2023-12-27 DIAGNOSIS — H9201 Otalgia, right ear: Secondary | ICD-10-CM

## 2023-12-27 DIAGNOSIS — J309 Allergic rhinitis, unspecified: Secondary | ICD-10-CM

## 2023-12-27 MED ORDER — AMOXICILLIN-POT CLAVULANATE 875-125 MG PO TABS: 1.0000 | ORAL_TABLET | Freq: Two times a day (BID) | ORAL | 0 refills | Status: DC

## 2023-12-27 NOTE — Assessment & Plan Note (Signed)
 Mild, for zyrtec 10 every day prn,  to f/u any worsening symptoms or concerns

## 2023-12-27 NOTE — Assessment & Plan Note (Signed)
 Roght ear itself is benign by exam, but does have the right angle of jaw sweling tender, etiology unclear, can't r/o infection vs malignancy - for augmentin  bid course, but if not improved will likely need further eval with CT neck r/o mass at right angle of jaw

## 2023-12-27 NOTE — Assessment & Plan Note (Signed)
 BP Readings from Last 3 Encounters:  12/27/23 120/72  10/28/23 122/60  10/07/23 124/78   Stable, pt to continue medical treatment  - diet, wt control

## 2023-12-27 NOTE — Assessment & Plan Note (Signed)
Lab Results  Component Value Date   HGBA1C 5.6 10/03/2023   Stable, pt to continue current medical treatment  - diet,wt control

## 2023-12-27 NOTE — Patient Instructions (Signed)
 Please take all new medication as prescribed -the antibiotic  You may want to take an OTC Probiotic while taking this due to possible diarrhea  Please continue all other medications as before, and refills have been done if requested.  Please have the pharmacy call with any other refills you may need.  Please keep your appointments with your specialists as you may have planned  Please call in 10 days if not improved to be considered for CT scan of the neck

## 2023-12-27 NOTE — Addendum Note (Signed)
 Addended by: Corwin Levins on: 12/27/2023 08:05 PM   Modules accepted: Level of Service

## 2023-12-27 NOTE — Assessment & Plan Note (Signed)
 Lab Results  Component Value Date   LDLCALC 42 10/03/2023   Stable, pt to continue current statin crestor5 qd

## 2023-12-27 NOTE — Progress Notes (Signed)
 Patient ID: Larry [name], male   DOB: 04-Jul-1932, 89 y.o.   MRN: [id number]        Chief Complaint: follow up pain about the right ear x 5 days       HPI:  Larry [name] is a 89 y.o. male here with c/o above, and has soreness to touch about meaning above and below the right pinna but has not rash, fever, drainage, or redness, sweling except for about the right angle of the right jaw non discrete swelling mild tender area.  Pt denies chest pain, increased sob or doe, wheezing, orthopnea, PND, increased LE swelling, palpitations, dizziness or syncope.   Pt denies polydipsia, polyuria, or new focal neuro s/s.   No oral or dental pain, infact has seen dental recently without problem found. Does have several wks ongoing nasal allergy symptoms with clearish congestion, itch and sneezing, without fever, pain, ST, cough, swelling or wheezing.       Wt Readings from Last 3 Encounters:  12/27/23 176 lb (79.8 kg)  10/28/23 173 lb 6.4 oz (78.7 kg)  10/07/23 172 lb (78 kg)   BP Readings from Last 3 Encounters:  12/27/23 120/72  10/28/23 122/60  10/07/23 124/78         Past Medical History:  Diagnosis Date   Allergic rhinitis, cause unspecified 03/12/2013   Allergy    CAD (coronary artery disease)    S/P CABG in 1999    Cataract    Decreased exercise tolerance    Exertional fatigue   Degenerative arthritis of hip    Diverticulosis of colon    ED (erectile dysfunction)    Erectile dysfunction 06/03/2014   History of colonic polyps    History of kidney stones    History of MI (myocardial infarction)    Hx of adenomatous colonic polyps 2000   Hyperlipidemia    Low HDL   Lumbar disc disease    Hx of with recent back pain and buttock pain   Myocardial infarction (HCC) 1999   Right knee meniscal tear    S/P removal of thyroid  nodule    Spinal stenosis of lumbar region 03/15/2014   Past Surgical History:  Procedure Laterality Date   BACK SURGERY  1960   CARDIAC CATHETERIZATION      CATARACT EXTRACTION     COLONOSCOPY     CORONARY ARTERY BYPASS GRAFT  1999   CYSTOSCOPY/URETEROSCOPY/HOLMIUM LASER/STENT PLACEMENT Left 12/23/2017   Procedure: CYSTOSCOPY/RETROGRADE/URETEROSCOPY/HOLMIUM LASER/STENT PLACEMENT;  Surgeon: Nieves Cough, MD;  Location: [facility];  Service: Urology;  Laterality: Left;  ONLY NEEDS 60 MIN   EYE SURGERY Bilateral    bilaterl cataract removal   LUMBAR DISC SURGERY     LUMBAR LAMINECTOMY/DECOMPRESSION MICRODISCECTOMY Bilateral 07/06/2021   Procedure: Bilateral Lumbar three-four Laminectomy and foraminotomy;  Surgeon: Colon Shove, MD;  Location: [facility] OR;  Service: Neurosurgery;  Laterality: Bilateral;   Nodules on Thyroid      POLYPECTOMY     PTCA     SHOULDER ARTHROSCOPY WITH ROTATOR CUFF REPAIR AND SUBACROMIAL DECOMPRESSION Right 01/30/2015   Procedure: RIGHT ARTHROSCOPY SHOULDER SUBACROMIAL DECOMPRESSION DISTAL CLAVICAL RESECTION RETATOR CUFF REPAIR;  Surgeon: Franky Pointer, MD;  Location: [facility] OR;  Service: Orthopedics;  Laterality: Right;   TONSILLECTOMY     VASECTOMY      reports that he has never smoked. He has never used smokeless tobacco. He reports current alcohol use. He reports that he does not use drugs. family history includes Heart disease in his brother and mother;  Prostate cancer in some other family members. Allergies  Allergen Reactions   Beta Adrenergic Blockers Other (See Comments)    Dropped blood pressure too low   Simvastatin Other (See Comments)    myalgia   Current Outpatient Medications on File Prior to Visit  Medication Sig Dispense Refill   Acetaminophen  (TYLENOL ) 325 MG CAPS Take as needed     calcium -vitamin D  (OSCAL WITH D) 500-200 MG-UNIT per tablet Take 1 tablet by mouth 2 (two) times daily.     cetirizine (ZYRTEC) 10 MG tablet Take 10 mg by mouth daily.     clopidogrel  (PLAVIX ) 75 MG tablet Take 1 tablet (75 mg total) by mouth daily. 90 tablet 3   fish oil-omega-3 fatty acids 1000 MG capsule Take 1 g by mouth 2  (two) times daily.     folic acid  (FOLVITE ) 400 MCG tablet Take 400 mcg by mouth daily.     Multiple Vitamin (MULTIVITAMIN WITH MINERALS) TABS tablet Take 1 tablet by mouth daily. Centrum Silver     nystatin  (MYCOSTATIN /NYSTOP ) powder Use as directed twice per day as needed to affected area 60 g 0   rosuvastatin  (CRESTOR ) 5 MG tablet Take 1 tablet daily alternating with 2 tablets every other day 135 tablet 3   tamsulosin  (FLOMAX ) 0.4 MG CAPS capsule Take 1 capsule (0.4 mg total) by mouth daily. 90 capsule 3   No current facility-administered medications on file prior to visit.        ROS:  All others reviewed and negative.  Objective        PE:  BP 120/72 (BP Location: Right Arm, Patient Position: Sitting)   Pulse 64   Temp 98 F (36.7 C) (Oral)   Ht 5' 7 (1.702 m)   Wt 176 lb (79.8 kg)   SpO2 98%   BMI 27.57 kg/m                 Constitutional: Pt appears in NAD               HENT: Head: NCAT.                Right Ear: External ear normal. Canal and TM clear.  No rash or swelling above the right ear where he states is mild tender.  Does have no discrete 1.5 cm area tender swelling at the angle of the right jaw               Left Ear: External ear normal.                Eyes: . Pupils are equal, round, and reactive to light. Conjunctivae and EOM are normal               Nose: without d/c or deformity               Neck: Neck supple. Gross normal ROM               Cardiovascular: Normal rate and regular rhythm.                 Pulmonary/Chest: Effort normal and breath sounds without rales or wheezing.               Neurological: Pt is alert. At baseline orientation, motor grossly intact               Skin: Skin is warm. No rashes, no other new lesions, LE edema - none  Psychiatric: Pt behavior is normal without agitation   Micro: none  Cardiac tracings I have personally interpreted today:  none  Pertinent Radiological findings (summarize): none   Lab Results   Component Value Date   WBC 7.1 10/03/2023   HGB 15.4 10/03/2023   HCT 45.9 10/03/2023   PLT 210.0 10/03/2023   GLUCOSE 108 (H) 10/03/2023   CHOL 99 10/03/2023   TRIG 105.0 10/03/2023   HDL 35.70 (L) 10/03/2023   LDLDIRECT 57.0 10/02/2021   LDLCALC 42 10/03/2023   ALT 15 10/03/2023   AST 17 10/03/2023   NA 141 10/03/2023   K 3.8 10/03/2023   CL 108 10/03/2023   CREATININE 1.10 10/03/2023   BUN 20 10/03/2023   CO2 26 10/03/2023   TSH 2.02 10/03/2023   PSA 1.40 03/20/2015   INR 0.99 12/19/2017   HGBA1C 5.6 10/03/2023   MICROALBUR <0.7 10/03/2023   Assessment/Plan:  Larry [name] is a 89 y.o. White or Caucasian [1] male with  has a past medical history of Allergic rhinitis, cause unspecified (03/12/2013), Allergy, CAD (coronary artery disease), Cataract, Decreased exercise tolerance, Degenerative arthritis of hip, Diverticulosis of colon, ED (erectile dysfunction), Erectile dysfunction (06/03/2014), History of colonic polyps, History of kidney stones, History of MI (myocardial infarction), adenomatous colonic polyps (2000), Hyperlipidemia, Lumbar disc disease, Myocardial infarction (HCC) (1999), Right knee meniscal tear, S/P removal of thyroid  nodule, and Spinal stenosis of lumbar region (03/15/2014).  Right ear pain Roght ear itself is benign by exam, but does have the right angle of jaw sweling tender, etiology unclear, can't r/o infection vs malignancy - for augmentin  bid course, but if not improved will likely need further eval with CT neck r/o mass at right angle of jaw  HYPERTENSION, BENIGN BP Readings from Last 3 Encounters:  12/27/23 120/72  10/28/23 122/60  10/07/23 124/78   Stable, pt to continue medical treatment  - diet, wt control   Hyperglycemia Lab Results  Component Value Date   HGBA1C 5.6 10/03/2023   Stable, pt to continue current medical treatment  - diet , wt control   Hyperlipidemia Lab Results  Component Value Date   LDLCALC 42 10/03/2023    Stable, pt to continue current statin crestor5 qd  Allergic rhinitis Mild, for zyrtec 10 every day prn,  to f/u any worsening symptoms or concerns Followup: Return if symptoms worsen or fail to improve.  Lynwood Rush, MD 12/27/2023 7:56 PM [facility]

## 2024-02-23 ENCOUNTER — Ambulatory Visit (INDEPENDENT_AMBULATORY_CARE_PROVIDER_SITE_OTHER): Payer: Medicare Other

## 2024-02-23 VITALS — BP 108/68 | HR 78 | Ht 64.0 in | Wt 174.8 lb

## 2024-02-23 DIAGNOSIS — Z0000 Encounter for general adult medical examination without abnormal findings: Secondary | ICD-10-CM

## 2024-02-23 NOTE — Patient Instructions (Addendum)
 [name] , Thank you for taking time to come for your Medicare Wellness Visit. I appreciate your ongoing commitment to your health goals. Please review the following plan we discussed and let me know if I can assist you in the future.   Referrals/Orders/Follow-Ups/Clinician Recommendations: Aim for 30 minutes of exercise or brisk walking, 6-8 glasses of water, and 5 servings of fruits and vegetables each day.   This is a list of the screening recommended for you and due dates:  Health Maintenance  Topic Date Due   DTaP/Tdap/Td vaccine (3 - Tdap) 03/13/2023   COVID-19 Vaccine (7 - 2024-25 season) 08/21/2023   Medicare Annual Wellness Visit  02/22/2025   Pneumonia Vaccine  Completed   Flu Shot  Completed   Zoster (Shingles) Vaccine  Completed   HPV Vaccine  Aged Out    Advanced directives: (In Chart) A copy of your advanced directives are scanned into your chart should your provider ever need it.  Next Medicare Annual Wellness Visit scheduled for next year: Yes - 09/2025

## 2024-02-23 NOTE — Progress Notes (Signed)
 Subjective:   Larry [name] is a 89 y.o. who presents for a Medicare Wellness preventive visit.  Visit Complete: In person  AWV Questionnaire: No: Patient Medicare AWV questionnaire was not completed prior to this visit.  Cardiac Risk Factors include: advanced age (>34men, >5 women);male gender;obesity (BMI >30kg/m2);hypertension;dyslipidemia     Objective:    Today's Vitals   02/23/24 1105  BP: 108/68  Pulse: 78  Weight: 174 lb 12.8 oz (79.3 kg)  Height: 5\' 4"  (1.626 m)   Body mass index is 30 kg/m.     02/23/2024   10:56 AM 02/22/2023   11:15 AM 02/16/2022   10:45 AM 08/04/2021   11:04 AM 07/06/2021    6:23 AM 07/02/2021    1:17 PM 01/28/2021    3:57 PM  Advanced Directives  Does Patient Have a Medical Advance Directive? Yes Yes Yes Yes No No Yes  Type of Estate agent of [place];Living will Healthcare Power of [place];Living will Healthcare Power of [place];Living will Living will;Healthcare Power of Attorney     Does patient want to make changes to medical advance directive? No - Patient declined No - Patient declined No - Patient declined    No - Patient declined  Copy of Healthcare Power of Attorney in Chart? Yes - validated most recent copy scanned in chart (See row information) Yes - validated most recent copy scanned in chart (See row information)  Yes - validated most recent copy scanned in chart (See row information)     Would patient like information on creating a medical advance directive?     No - Patient declined No - Patient declined     Current Medications (verified) Outpatient Encounter Medications as of 02/23/2024  Medication Sig   Acetaminophen (TYLENOL) 325 MG CAPS Take as needed   calcium-vitamin D (OSCAL WITH D) 500-200 MG-UNIT per tablet Take 1 tablet by mouth 2 (two) times daily.   cetirizine (ZYRTEC) 10 MG tablet Take 10 mg by mouth daily.   clopidogrel (PLAVIX) 75 MG tablet Take 1 tablet (75 mg total) by mouth daily.   fish  oil-omega-3 fatty acids 1000 MG capsule Take 1 g by mouth 2 (two) times daily.   folic acid (FOLVITE) 400 MCG tablet Take 400 mcg by mouth daily.   Multiple Vitamin (MULTIVITAMIN WITH MINERALS) TABS tablet Take 1 tablet by mouth daily. Centrum Silver   nystatin (MYCOSTATIN/NYSTOP) powder Use as directed twice per day as needed to affected area   rosuvastatin (CRESTOR) 5 MG tablet Take 1 tablet daily alternating with 2 tablets every other day   tamsulosin (FLOMAX) 0.4 MG CAPS capsule Take 1 capsule (0.4 mg total) by mouth daily.   [DISCONTINUED] amoxicillin-clavulanate (AUGMENTIN) 875-125 MG tablet Take 1 tablet by mouth 2 (two) times daily.   No facility-administered encounter medications on file as of 02/23/2024.    Allergies (verified) Beta adrenergic blockers and Simvastatin   History: Past Medical History:  Diagnosis Date   Allergic rhinitis, cause unspecified 03/12/2013   Allergy    CAD (coronary artery disease)    S/P CABG in 1999    Cataract    Decreased exercise tolerance    Exertional fatigue   Degenerative arthritis of hip    Diverticulosis of colon    ED (erectile dysfunction)    Erectile dysfunction 06/03/2014   History of colonic polyps    History of kidney stones    History of MI (myocardial infarction)    Hx of adenomatous colonic polyps 2000  Hyperlipidemia    Low HDL   Lumbar disc disease    Hx of with recent back pain and buttock pain   Myocardial infarction [facility]) 1999   Right knee meniscal tear    S/P removal of thyroid nodule    Spinal stenosis of lumbar region 03/15/2014   Past Surgical History:  Procedure Laterality Date   BACK SURGERY  1960   CARDIAC CATHETERIZATION     CATARACT EXTRACTION     COLONOSCOPY     CORONARY ARTERY BYPASS GRAFT  1999   CYSTOSCOPY/URETEROSCOPY/HOLMIUM LASER/STENT PLACEMENT Left 12/23/2017   Procedure: CYSTOSCOPY/RETROGRADE/URETEROSCOPY/HOLMIUM LASER/STENT PLACEMENT;  Surgeon: Jerilee Field, MD;  Location: [facility] ORS;   Service: Urology;  Laterality: Left;  ONLY NEEDS 60 MIN   EYE SURGERY Bilateral    bilaterl cataract removal   LUMBAR DISC SURGERY     LUMBAR LAMINECTOMY/DECOMPRESSION MICRODISCECTOMY Bilateral 07/06/2021   Procedure: Bilateral Lumbar three-four Laminectomy and foraminotomy;  Surgeon: Barnett Abu, MD;  Location: [facility] OR;  Service: Neurosurgery;  Laterality: Bilateral;   Nodules on Thyroid     POLYPECTOMY     PTCA     SHOULDER ARTHROSCOPY WITH ROTATOR CUFF REPAIR AND SUBACROMIAL DECOMPRESSION Right 01/30/2015   Procedure: RIGHT ARTHROSCOPY SHOULDER SUBACROMIAL DECOMPRESSION DISTAL CLAVICAL RESECTION RETATOR CUFF REPAIR;  Surgeon: Francena Hanly, MD;  Location: [facility] OR;  Service: Orthopedics;  Laterality: Right;   TONSILLECTOMY     VASECTOMY     Family History  Problem Relation Age of Onset   Prostate cancer Other    Prostate cancer Other    Heart disease Mother    Heart disease Brother    Colon cancer Neg Hx    Rectal cancer Neg Hx    Stomach cancer Neg Hx    Social History   Socioeconomic History   Marital status: Married    Spouse name: Not on file   Number of children: 2   Years of education: Not on file   Highest education level: Not on file  Occupational History   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  Tobacco Use   Smoking status: Never    Passive exposure: Never   Smokeless tobacco: Never  Vaping Use   Vaping status: Never Used  Substance and Sexual Activity   Alcohol use: Not Currently    Comment: rare   Drug use: No   Sexual activity: Not Currently  Other Topics Concern   Not on file  Social History Narrative   Moved from [facility]   Married      [employer] on file   Social Drivers of Health   Financial Resource Strain: Low Risk  (02/23/2024)   Overall Financial Resource Strain (CARDIA)    Difficulty of Paying Living Expenses: Not hard at all  Food Insecurity: No Food Insecurity (02/23/2024)   Hunger Vital Sign     Worried About Running Out of Food in the Last Year: Never true    Ran Out of Food in the Last Year: Never true  Transportation Needs: No Transportation Needs (02/23/2024)   PRAPARE - Administrator, Civil Service (Medical): No    Lack of Transportation (Non-Medical): No  Physical Activity: Sufficiently Active (02/23/2024)   Exercise Vital Sign    Days of Exercise per Week: 5 days    Minutes of Exercise per Session: 60 min  Stress: No Stress Concern Present (02/23/2024)   [employer] of Occupational Health - Occupational Stress Questionnaire    Feeling  of Stress : Not at all  Social Connections: Moderately Isolated (02/23/2024)   Social Connection and Isolation Panel [NHANES]    Frequency of Communication with Friends and Family: More than three times a week    Frequency of Social Gatherings with Friends and Family: More than three times a week    Attends Religious Services: Never    Database administrator or Organizations: No    Attends Engineer, structural: Never    Marital Status: Married    Tobacco Counseling - Former Smoker Counseling given - N/A  Clinical Intake:  Pre-visit preparation completed: Yes  Pain : No/denies pain     BMI - recorded: 30 Nutritional Status: BMI > 30  Obese Nutritional Risks: None Diabetes: No  How often do you need to have someone help you when you read instructions, pamphlets, or other written materials from your doctor or pharmacy?: 1 - Never  Interpreter Needed?: No  Information entered by :: Hassell Halim, CMA   Activities of Daily Living     02/23/2024   11:07 AM  In your present state of health, do you have any difficulty performing the following activities:  Hearing? 0  Vision? 0  Difficulty concentrating or making decisions? 0  Walking or climbing stairs? 0  Dressing or bathing? 0  Doing errands, shopping? 0  Preparing Food and eating ? N  Using the Toilet? N  In the past six months, have you accidently  leaked urine? N  Do you have problems with loss of bowel control? N  Managing your Medications? N  Managing your Finances? N  Housekeeping or managing your Housekeeping? N    Patient Care Team: Corwin Levins, MD as PCP - General [place] James Nile Dear, MD as PCP - Cardiology (Cardiology) [facility] as Consulting Physician (Ophthalmology)  Indicate any recent Medical Services you may have received from other than [facility] providers in the past year (date may be approximate).     Assessment:   This is a routine wellness examination for [place].  Hearing/Vision screen Hearing Screening - Comments:: Denies hearing difficulties   Vision Screening - Comments:: Wears rx glasses - up to date with routine eye exams with Dr Dione Booze   Goals Addressed               This Visit's Progress     Patient Stated (pt-stated)        Patient stated he plans to continue staying active and walking at the park daily.       Depression Screen     02/23/2024   11:09 AM 12/27/2023    3:25 PM 10/07/2023   10:20 AM 03/02/2023   10:37 AM 02/22/2023   11:54 AM 10/05/2022   10:07 AM 04/05/2022    1:57 PM  PHQ 2/9 Scores  PHQ - 2 Score 0 0 0 0 0 0 0  PHQ- 9 Score 0   0 0 0     Fall Risk     02/23/2024   11:12 AM 12/27/2023    3:25 PM 10/07/2023   10:20 AM 03/02/2023   10:36 AM 02/22/2023   11:55 AM  Fall Risk   Falls in the past year? 0 0 0 0 0  Number falls in past yr: 0 0 0 0 0  Injury with Fall? 0 0 0 0 0  Risk for fall due to : No Fall Risks No Fall Risks No Fall Risks Impaired balance/gait No Fall Risks  Risk  for fall due to: Comment    cane   Follow up Falls evaluation completed;Falls prevention discussed Falls evaluation completed Falls evaluation completed Falls evaluation completed Falls prevention discussed    MEDICARE RISK AT HOME:  Medicare Risk at Home Any stairs in or around the home?: Yes If so, are there any without handrails?: No Home free of loose throw rugs in  walkways, pet beds, electrical cords, etc?: Yes Adequate lighting in your home to reduce risk of falls?: Yes Life alert?: No Use of a cane, walker or w/c?: No Grab bars in the bathroom?: Yes Shower chair or bench in shower?: Yes Elevated toilet seat or a handicapped toilet?: No  TIMED UP AND GO:  Was the test performed?  No  Cognitive Function: 6CIT completed    09/05/2018    3:25 PM 04/20/2016    3:55 PM 04/20/2016    3:06 PM  MMSE - Mini Mental State Exam  Not completed:  -- --  Orientation to time 5    Orientation to Place 5    Registration 3    Attention/ Calculation 4    Recall 2    Language- name 2 objects 2    Language- repeat 1    Language- follow 3 step command 3    Language- read & follow direction 1    Write a sentence 1    Copy design 1    Total score 28          02/23/2024   11:12 AM 02/22/2023   11:15 AM  6CIT Screen  What Year? 0 points 0 points  What month? 0 points 0 points  What time? 0 points 0 points  Count back from 20 0 points 0 points  Months in reverse 0 points 0 points  Repeat phrase 2 points 0 points  Total Score 2 points 0 points    Immunizations Immunization History  Administered Date(s) Administered   Fluad Quad(high Dose 65+) 09/07/2019, 09/30/2020, 10/02/2021, 09/09/2022   Fluad Trivalent(High Dose 65+) 10/07/2023   Influenza Split 10/01/2011, 09/06/2012   Influenza Whole 09/20/2007, 10/04/2008, 10/13/2009, 09/07/2010   Influenza, High Dose Seasonal PF 08/12/2017, 09/01/2018   Influenza,inj,Quad PF,6+ Mos 09/26/2013, 09/19/2014   Influenza-Unspecified 12/02/1999, 10/15/2015   PFIZER(Purple Top)SARS-COV-2 Vaccination 01/25/2020, 02/15/2020, 09/27/2020, 03/23/2021, 09/03/2021   Pfizer Covid-19 Vaccine Bivalent Booster 109yrs & up 12/03/2022   Pneumococcal Conjugate-13 03/15/2014   Pneumococcal Polysaccharide-23 01/20/2007, 01/28/2021   Td 01/20/2002   Tetanus 03/12/2013   Zoster Recombinant(Shingrix) 01/30/2019, 07/03/2019     Screening Tests Health Maintenance  Topic Date Due   DTaP/Tdap/Td (3 - Tdap) 03/13/2023   COVID-19 Vaccine (7 - 2024-25 season) 08/21/2023   Medicare Annual Wellness (AWV)  02/22/2025   Pneumonia Vaccine 51+ Years old  Completed   INFLUENZA VACCINE  Completed   Zoster Vaccines- Shingrix  Completed   HPV VACCINES  Aged Out    Health Maintenance  Health Maintenance Due  Topic Date Due   DTaP/Tdap/Td (3 - Tdap) 03/13/2023   COVID-19 Vaccine (7 - 2024-25 season) 08/21/2023   Health Maintenance Items Addressed:02/23/24   Additional Screening:  Vision Screening: Recommended annual ophthalmology exams for early detection of glaucoma and other disorders of the eye. Patient stated had annual eye exam in 09/2023 and will schedule an appt for 2025.  Dental Screening: Recommended annual dental exams for proper oral hygiene  Community Resource Referral / Chronic Care Management: CRR required this visit?  No   CCM required this visit?  No  Plan:     I have personally reviewed and noted the following in the patient's chart:   Medical and social history Use of alcohol, tobacco or illicit drugs  Current medications and supplements including opioid prescriptions. Patient is not currently taking opioid prescriptions. Functional ability and status Nutritional status Physical activity Advanced directives List of other physicians Hospitalizations, surgeries, and ER visits in previous 12 months Vitals Screenings to include cognitive, depression, and falls Referrals and appointments  In addition, I have reviewed and discussed with patient certain preventive protocols, quality metrics, and best practice recommendations. A written personalized care plan for preventive services as well as general preventive health recommendations were provided to patient.     Darreld Mclean, CMA   02/23/2024   After Visit Summary: (MyChart) Due to this being a telephonic visit, the after visit  summary with patients personalized plan was offered to patient via MyChart   Notes: Nothing significant to report at this time.

## 2024-02-27 ENCOUNTER — Other Ambulatory Visit: Payer: Self-pay

## 2024-02-27 ENCOUNTER — Other Ambulatory Visit: Payer: Self-pay | Admitting: Internal Medicine

## 2024-05-03 ENCOUNTER — Ambulatory Visit: Payer: Medicare Other | Attending: Cardiovascular Disease | Admitting: Cardiovascular Disease

## 2024-05-03 ENCOUNTER — Encounter: Payer: Self-pay | Admitting: Cardiovascular Disease

## 2024-05-03 ENCOUNTER — Ambulatory Visit

## 2024-05-03 VITALS — BP 120/72 | HR 75 | Ht 64.0 in | Wt 172.0 lb

## 2024-05-03 DIAGNOSIS — I7121 Aneurysm of the ascending aorta, without rupture: Secondary | ICD-10-CM | POA: Diagnosis present

## 2024-05-03 DIAGNOSIS — I351 Nonrheumatic aortic (valve) insufficiency: Secondary | ICD-10-CM | POA: Diagnosis present

## 2024-05-03 DIAGNOSIS — E7800 Pure hypercholesterolemia, unspecified: Secondary | ICD-10-CM | POA: Insufficient documentation

## 2024-05-03 DIAGNOSIS — I10 Essential (primary) hypertension: Secondary | ICD-10-CM | POA: Diagnosis not present

## 2024-05-03 DIAGNOSIS — R002 Palpitations: Secondary | ICD-10-CM | POA: Diagnosis present

## 2024-05-03 DIAGNOSIS — E785 Hyperlipidemia, unspecified: Secondary | ICD-10-CM | POA: Insufficient documentation

## 2024-05-03 DIAGNOSIS — I493 Ventricular premature depolarization: Secondary | ICD-10-CM | POA: Insufficient documentation

## 2024-05-03 DIAGNOSIS — I25810 Atherosclerosis of coronary artery bypass graft(s) without angina pectoris: Secondary | ICD-10-CM | POA: Diagnosis present

## 2024-05-03 NOTE — Patient Instructions (Signed)
 Medication Instructions:  No changes today *If you need a refill on your cardiac medications before your next appointment, please call your pharmacy*  Lab Work: Today go to the first floor lab - b[facility], tsh If you have labs (blood work) drawn today and your tests are completely normal, you will receive your results only by: MyChart Message (if you have MyChart) OR A paper copy in the mail If you have any lab test that is abnormal or we need to change your treatment, we will call you to review the results.  Testing/Procedures: Zio Heart Patch - 7 days  Follow-Up: At [facility], you and your health needs are our priority.  As part of our continuing mission to provide you with exceptional heart care, our providers are all part of one team.  This team includes your primary Cardiologist (physician) and Advanced Practice Providers or APPs (Physician Assistants and Nurse Practitioners) who all work together to provide you with the care you need, when you need it.  Your next appointment:   12 month(s)  Provider:   Antoinette Batman, MD     Other Instructions

## 2024-05-03 NOTE — Progress Notes (Signed)
 Chief Complaint  Patient presents with   Follow-up    CAD, palpitations   History of Present Illness: 89 yo male with history of CAD s/p 3V CABG in 1999, HLD and PVCs here for cardiac follow up. He had 3V CABG in 1999 at [facility] in [place]. In 2008 he had a catheterization and was found to have a patent LIMA to LAD and a patent vein graft to the right coronary and no significant obstruction in the circumflex artery. Exercise stress test April 2015 with no ischemia. He has had PVCs noted on cardiac monitor and was started on Cardizem  but he did not tolerate the Cardizem  due to dizziness so it was stopped. Echo January 2020 with LVEF=50-55%, mild AI and mildly dilated aortic root. Chest CTA May 2021 with stable 4.3-4.4 cm ascending aortic aneurysm with slight enlargement of 4.3 cm at the sinuses of Valsalva. Echo December 2024 with LVEF=55-60. Mild MR. Moderate AI.   He is here today for follow up. He denies any chest pain, dyspnea, lower extremity edema, orthopnea, PND, dizziness, near syncope or syncope. He has been feeling his heart race at night.   Primary Care Physician: Roslyn Coombe, MD  Past Medical History:  Diagnosis Date   Allergic rhinitis, cause unspecified 03/12/2013   Allergy    CAD (coronary artery disease)    S/P CABG in 1999    Cataract    Decreased exercise tolerance    Exertional fatigue   Degenerative arthritis of hip    Diverticulosis of colon    ED (erectile dysfunction)    Erectile dysfunction 06/03/2014   History of colonic polyps    History of kidney stones    History of MI (myocardial infarction)    Hx of adenomatous colonic polyps 2000   Hyperlipidemia    Low HDL   Lumbar disc disease    Hx of with recent back pain and buttock pain   Myocardial infarction [facility]) 1999   Right knee meniscal tear    S/P removal of thyroid  nodule    Spinal stenosis of lumbar region 03/15/2014    Past Surgical History:  Procedure Laterality Date   BACK  SURGERY  1960   CARDIAC CATHETERIZATION     CATARACT EXTRACTION     COLONOSCOPY     CORONARY ARTERY BYPASS GRAFT  1999   CYSTOSCOPY/URETEROSCOPY/HOLMIUM LASER/STENT PLACEMENT Left 12/23/2017   Procedure: CYSTOSCOPY/RETROGRADE/URETEROSCOPY/HOLMIUM LASER/STENT PLACEMENT;  Surgeon: Christina Coyer, MD;  Location: [facility];  Service: Urology;  Laterality: Left;  ONLY NEEDS 60 MIN   EYE SURGERY Bilateral    bilaterl cataract removal   LUMBAR DISC SURGERY     LUMBAR LAMINECTOMY/DECOMPRESSION MICRODISCECTOMY Bilateral 07/06/2021   Procedure: Bilateral Lumbar three-four Laminectomy and foraminotomy;  Surgeon: Elna Haggis, MD;  Location: [facility];  Service: Neurosurgery;  Laterality: Bilateral;   Nodules on Thyroid      POLYPECTOMY     PTCA     SHOULDER ARTHROSCOPY WITH ROTATOR CUFF REPAIR AND SUBACROMIAL DECOMPRESSION Right 01/30/2015   Procedure: RIGHT ARTHROSCOPY SHOULDER SUBACROMIAL DECOMPRESSION DISTAL CLAVICAL RESECTION RETATOR CUFF REPAIR;  Surgeon: Ellard Gunning, MD;  Location: [facility] OR;  Service: Orthopedics;  Laterality: Right;   TONSILLECTOMY     VASECTOMY      Current Outpatient Medications  Medication Sig Dispense Refill   Acetaminophen  (TYLENOL ) 325 MG CAPS Take as needed     calcium -vitamin D  (OSCAL WITH D) 500-200 MG-UNIT per tablet Take 1 tablet by mouth 2 (two) times daily.  cetirizine (ZYRTEC) 10 MG tablet Take 10 mg by mouth daily.     clopidogrel  (PLAVIX ) 75 MG tablet Take 1 tablet (75 mg total) by mouth daily. 90 tablet 3   fish oil-omega-3 fatty acids 1000 MG capsule Take 1 g by mouth 2 (two) times daily.     folic acid (FOLVITE) 400 MCG tablet Take 400 mcg by mouth daily.     Multiple Vitamin (MULTIVITAMIN WITH MINERALS) TABS tablet Take 1 tablet by mouth daily. Centrum Silver     nystatin  (MYCOSTATIN /NYSTOP ) powder Use as directed twice per day as needed to affected area 60 g 0   rosuvastatin  (CRESTOR ) 5 MG tablet Take 1 tablet daily alternating with 2 tablets every other  day 135 tablet 3   tamsulosin  (FLOMAX ) 0.4 MG CAPS capsule TAKE 1 CAPSULE DAILY 90 capsule 3   No current facility-administered medications for this visit.    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  Tobacco Use   Smoking status: Never    Passive exposure: Never   Smokeless tobacco: Never  Vaping Use   Vaping status: Never Used  Substance and Sexual Activity   Alcohol use: Not Currently    Comment: rare   Drug use: No   Sexual activity: Not Currently  Other Topics Concern   Not on file  Social History Narrative   Moved from [facility]   Married      [employer] on file   Social Drivers of Health   Financial Resource Strain: Low Risk  (02/23/2024)   Overall Financial Resource Strain (CARDIA)    Difficulty of Paying Living Expenses: Not hard at all  Food Insecurity: No Food Insecurity (02/23/2024)   Hunger Vital Sign    Worried About Running Out of Food in the Last Year: Never true    Ran Out of Food in the Last Year: Never true  Transportation Needs: No Transportation Needs (02/23/2024)   PRAPARE - Administrator, Civil Service (Medical): No    Lack of Transportation (Non-Medical): No  Physical Activity: Sufficiently Active (02/23/2024)   Exercise Vital Sign    Days of Exercise per Week: 5 days    Minutes of Exercise per Session: 60 min  Stress: No Stress Concern Present (02/23/2024)   [employer] of Occupational Health - Occupational Stress Questionnaire    Feeling of Stress : Not at all  Social Connections: Moderately Isolated (02/23/2024)    Social Connection and Isolation Panel [NHANES]    Frequency of Communication with Friends and Family: More than three times a week    Frequency of Social Gatherings with Friends and Family: More than three times a week    Attends Religious Services: Never    Database administrator or Organizations: No    Attends Banker Meetings: Never    Marital Status: Married  Catering manager Violence: Not At Risk (02/23/2024)   Humiliation, Afraid, Rape, and Kick questionnaire    Fear of Current or Ex-Partner: No  Emotionally Abused: No    Physically Abused: No    Sexually Abused: No    Family History  Problem Relation Age of Onset   Prostate cancer Other    Prostate cancer Other    Heart disease Mother    Heart disease Brother    Colon cancer Neg Hx    Rectal cancer Neg Hx    Stomach cancer Neg Hx     Review of Systems:  As stated in the HPI and otherwise negative.   BP 120/72   Pulse 75   Ht 5\' 4"  (1.626 m)   Wt 172 lb (78 kg)   SpO2 97%   BMI 29.52 kg/m   Physical Examination: General: Well developed, well nourished, NAD  HEENT: OP clear, mucus membranes moist  SKIN: warm, dry. No rashes. Neuro: No focal deficits  Musculoskeletal: Muscle strength 5/5 all ext  Psychiatric: Mood and affect normal  Neck: No JVD, no carotid bruits, no thyromegaly, no lymphadenopathy.  Lungs:Clear bilaterally, no wheezes, rhonci, crackles Cardiovascular: Regular rate and rhythm. Soft diastolic murmur.  Abdomen:Soft. Bowel sounds present. Non-tender.  Extremities: No lower extremity edema. Pulses are 2 + in the bilateral DP/PT.  EKG:  EKG is ordered today The ekg ordered today demonstrates  EKG Interpretation Date/Time:  Thursday May 03 2024 11:09:39 EDT Ventricular Rate:  89 PR Interval:  172 QRS Duration:  102 QT Interval:  394 QTC Calculation: 479 R Axis:   -9  Text Interpretation: Sinus rhythm with premature atrial contractions Nonspecific ST and T wave abnormality  Confirmed by Antoinette Batman [phone]) on 05/03/2024 11:16:52 AM   Recent Labs: 10/03/2023: ALT 15; BUN 20; Creatinine, Ser 1.10; Hemoglobin 15.4; Platelets 210.0; Potassium 3.8; Sodium 141; TSH 2.02   Lipid Panel    Component Value Date/Time   CHOL 99 10/03/2023 1012   CHOL 121 05/23/2018 0939   CHOL 118 04/03/2014 0843   TRIG 105.0 10/03/2023 1012   TRIG 162 (H) 04/03/2014 0843   HDL 35.70 (L) 10/03/2023 1012   HDL 34 (L) 05/23/2018 0939   HDL 36 (L) 04/03/2014 0843   CHOLHDL 3 10/03/2023 1012   VLDL 21.0 10/03/2023 1012   LDLCALC 42 10/03/2023 1012   LDLCALC 61 05/23/2018 0939   LDLCALC 50 04/03/2014 0843   LDLDIRECT 57.0 10/02/2021 1219     Wt Readings from Last 3 Encounters:  05/03/24 172 lb (78 kg)  02/23/24 174 lb 12.8 oz (79.3 kg)  12/27/23 176 lb (79.8 kg)    Assessment and Plan:   1. CAD s/p CABG without angina: No chest pain. LVEF 55% by echo in December 2024. Continue statin and Plavix .     2. PVCs: No palpitations. He did not tolerate Cardizem  in the past and does not wish to take a beta blocker or other calcium  channel blocker.   3. HLD: LDL 42 in October 2024. Continue statin.    4. Ascending aortic aneurysm: Ascending aorta dilated at 4.4 cm by CTA chest May 2021. He does not wish to repeat this test due to his age.   5. Aortic valve insufficiency: Moderate by echo in December 2024.     6. Transient LBBB: New in 2023. He has no ischemic symptoms. No plans for ischemic testing given advanced age and lack of symptoms.   7. Palpitations: Will arrange 7 day Zio cardiac monitor  Labs/ tests ordered today include:   Orders Placed This Encounter  Procedures   TSH   Basic metabolic panel with GFR  LONG TERM MONITOR (3-14 DAYS)   EKG 12-Lead   Disposition:   F/U with me in 6  months  Signed, Antoinette Batman, MD 05/03/2024 11:57 AM    [facility] [address] [place], [place], [place]  [address] Phone: [phone]; Fax: [phone]

## 2024-05-03 NOTE — Progress Notes (Unsigned)
 Applied a 7 day Day Zio XT monitor to patient in the office

## 2024-05-04 ENCOUNTER — Ambulatory Visit: Payer: Self-pay | Admitting: Cardiovascular Disease

## 2024-05-04 LAB — BASIC METABOLIC PANEL WITH GFR
BUN/Creatinine Ratio: 15 (ref 10–24)
BUN: 17 mg/dL (ref 8–27)
CO2: 23 mmol/L (ref 20–29)
Calcium: 9.6 mg/dL (ref 8.6–10.2)
Chloride: 102 mmol/L (ref 96–106)
Creatinine, Ser: 1.14 mg/dL (ref 0.76–1.27)
Glucose: 103 mg/dL — ABNORMAL HIGH (ref 70–99)
Potassium: 4.4 mmol/L (ref 3.5–5.2)
Sodium: 141 mmol/L (ref 134–144)
eGFR: 61 mL/min/{1.73_m2} (ref >59)

## 2024-05-04 LAB — TSH: TSH: 2.04 u[IU]/mL (ref 0.450–4.500)

## 2024-05-18 DIAGNOSIS — R002 Palpitations: Secondary | ICD-10-CM | POA: Diagnosis not present

## 2024-08-26 ENCOUNTER — Emergency Department (HOSPITAL_BASED_OUTPATIENT_CLINIC_OR_DEPARTMENT_OTHER)

## 2024-08-26 ENCOUNTER — Inpatient Hospital Stay (HOSPITAL_BASED_OUTPATIENT_CLINIC_OR_DEPARTMENT_OTHER)
Admission: EM | Admit: 2024-08-26 | Discharge: 2024-08-29 | DRG: 872 | Disposition: A | Discharge status: Home / Self Care | Attending: Internal Medicine | Admitting: Internal Medicine

## 2024-08-26 DIAGNOSIS — A4159 Other Gram-negative sepsis: Principal | ICD-10-CM | POA: Diagnosis present

## 2024-08-26 DIAGNOSIS — Z951 Presence of aortocoronary bypass graft: Secondary | ICD-10-CM

## 2024-08-26 DIAGNOSIS — Z888 Allergy status to other drugs, medicaments and biological substances status: Secondary | ICD-10-CM | POA: Diagnosis not present

## 2024-08-26 DIAGNOSIS — Z8249 Family history of ischemic heart disease and other diseases of the circulatory system: Secondary | ICD-10-CM | POA: Diagnosis not present

## 2024-08-26 DIAGNOSIS — I252 Old myocardial infarction: Secondary | ICD-10-CM

## 2024-08-26 DIAGNOSIS — E860 Dehydration: Secondary | ICD-10-CM | POA: Diagnosis present

## 2024-08-26 DIAGNOSIS — R531 Weakness: Secondary | ICD-10-CM

## 2024-08-26 DIAGNOSIS — E872 Acidosis, unspecified: Secondary | ICD-10-CM | POA: Diagnosis present

## 2024-08-26 DIAGNOSIS — Z1152 Encounter for screening for COVID-19: Secondary | ICD-10-CM

## 2024-08-26 DIAGNOSIS — Z8042 Family history of malignant neoplasm of prostate: Secondary | ICD-10-CM

## 2024-08-26 DIAGNOSIS — A419 Sepsis, unspecified organism: Secondary | ICD-10-CM | POA: Diagnosis not present

## 2024-08-26 DIAGNOSIS — N390 Urinary tract infection, site not specified: Secondary | ICD-10-CM | POA: Diagnosis not present

## 2024-08-26 DIAGNOSIS — R338 Other retention of urine: Secondary | ICD-10-CM | POA: Diagnosis present

## 2024-08-26 DIAGNOSIS — E785 Hyperlipidemia, unspecified: Secondary | ICD-10-CM | POA: Diagnosis present

## 2024-08-26 DIAGNOSIS — Z66 Do not resuscitate: Secondary | ICD-10-CM | POA: Diagnosis present

## 2024-08-26 DIAGNOSIS — I10 Essential (primary) hypertension: Secondary | ICD-10-CM | POA: Diagnosis present

## 2024-08-26 DIAGNOSIS — E871 Hypo-osmolality and hyponatremia: Secondary | ICD-10-CM | POA: Diagnosis present

## 2024-08-26 DIAGNOSIS — R6510 Systemic inflammatory response syndrome (SIRS) of non-infectious origin without acute organ dysfunction: Principal | ICD-10-CM

## 2024-08-26 DIAGNOSIS — I2510 Atherosclerotic heart disease of native coronary artery without angina pectoris: Secondary | ICD-10-CM | POA: Diagnosis present

## 2024-08-26 DIAGNOSIS — N401 Enlarged prostate with lower urinary tract symptoms: Secondary | ICD-10-CM | POA: Diagnosis present

## 2024-08-26 DIAGNOSIS — N179 Acute kidney failure, unspecified: Secondary | ICD-10-CM | POA: Diagnosis present

## 2024-08-26 DIAGNOSIS — N3000 Acute cystitis without hematuria: Secondary | ICD-10-CM | POA: Diagnosis present

## 2024-08-26 DIAGNOSIS — Z87442 Personal history of urinary calculi: Secondary | ICD-10-CM

## 2024-08-26 DIAGNOSIS — Z7902 Long term (current) use of antithrombotics/antiplatelets: Secondary | ICD-10-CM

## 2024-08-26 DIAGNOSIS — R339 Retention of urine, unspecified: Secondary | ICD-10-CM | POA: Diagnosis present

## 2024-08-26 DIAGNOSIS — Z79899 Other long term (current) drug therapy: Secondary | ICD-10-CM | POA: Diagnosis not present

## 2024-08-26 LAB — COMPREHENSIVE METABOLIC PANEL WITH GFR
ALT: 22 U/L (ref 0–44)
AST: 42 U/L — ABNORMAL HIGH (ref 15–41)
Albumin: 3.8 g/dL (ref 3.5–5.0)
Alkaline Phosphatase: 60 U/L (ref 38–126)
Anion gap: 15 (ref 5–15)
BUN: 23 mg/dL (ref 8–23)
CO2: 16 mmol/L — ABNORMAL LOW (ref 22–32)
Calcium: 9.6 mg/dL (ref 8.9–10.3)
Chloride: 101 mmol/L (ref 98–111)
Creatinine, Ser: 1.53 mg/dL — ABNORMAL HIGH (ref 0.61–1.24)
GFR, Estimated: 43 mL/min — ABNORMAL LOW (ref >60)
Glucose, Bld: 126 mg/dL — ABNORMAL HIGH (ref 70–99)
Potassium: 4.5 mmol/L (ref 3.5–5.1)
Sodium: 132 mmol/L — ABNORMAL LOW (ref 135–145)
Total Bilirubin: 2.2 mg/dL — ABNORMAL HIGH (ref 0.0–1.2)
Total Protein: 7.3 g/dL (ref 6.5–8.1)

## 2024-08-26 LAB — RESP PANEL BY RT-PCR (RSV, FLU A&B, COVID)  RVPGX2
Influenza A by PCR: NEGATIVE
Influenza B by PCR: NEGATIVE
Resp Syncytial Virus by PCR: NEGATIVE
SARS Coronavirus 2 by RT PCR: NEGATIVE

## 2024-08-26 LAB — URINALYSIS, ROUTINE W REFLEX MICROSCOPIC
Bilirubin Urine: NEGATIVE
Glucose, UA: NEGATIVE mg/dL
Ketones, ur: NEGATIVE mg/dL
Nitrite: NEGATIVE
Specific Gravity, Urine: 1.02 (ref 1.005–1.030)
WBC, UA: >50 WBC/hpf (ref 0–5)
pH: 6 (ref 5.0–8.0)

## 2024-08-26 LAB — CBC
HCT: 46.4 % (ref 39.0–52.0)
Hemoglobin: 16 g/dL (ref 13.0–17.0)
MCH: 31.7 pg (ref 26.0–34.0)
MCHC: 34.5 g/dL (ref 30.0–36.0)
MCV: 91.9 fL (ref 80.0–100.0)
Platelets: 142 K/uL — ABNORMAL LOW (ref 150–400)
RBC: 5.05 MIL/uL (ref 4.22–5.81)
RDW: 13.8 % (ref 11.5–15.5)
WBC: 21.2 K/uL — ABNORMAL HIGH (ref 4.0–10.5)
nRBC: 0 % (ref 0.0–0.2)

## 2024-08-26 LAB — LACTIC ACID, PLASMA: Lactic Acid, Venous: 1.1 mmol/L (ref 0.5–1.9)

## 2024-08-26 MED ORDER — ACETAMINOPHEN 500 MG PO TABS
1000.0000 mg | ORAL_TABLET | Freq: Once | ORAL | Status: AC
  Administered 2024-08-26: 1000 mg via ORAL
  Filled 2024-08-26: qty 2

## 2024-08-26 MED ORDER — IOHEXOL 300 MG/ML  SOLN
80.0000 mL | Freq: Once | INTRAMUSCULAR | Status: AC | PRN
  Administered 2024-08-26: 80 mL via INTRAVENOUS

## 2024-08-26 MED ORDER — ACETAMINOPHEN 325 MG PO TABS
650.0000 mg | ORAL_TABLET | Freq: Four times a day (QID) | ORAL | Status: DC | PRN
  Administered 2024-08-29: 650 mg via ORAL
  Filled 2024-08-26: qty 2

## 2024-08-26 MED ORDER — BISACODYL 5 MG PO TBEC: 5.0000 mg | DELAYED_RELEASE_TABLET | Freq: Every day | ORAL | Status: DC | PRN

## 2024-08-26 MED ORDER — ROSUVASTATIN CALCIUM 5 MG PO TABS
5.0000 mg | ORAL_TABLET | ORAL | Status: DC
  Administered 2024-08-26 – 2024-08-28 (×2): 5 mg via ORAL
  Filled 2024-08-26 (×2): qty 1

## 2024-08-26 MED ORDER — ONDANSETRON HCL 4 MG/2ML IJ SOLN
4.0000 mg | Freq: Once | INTRAMUSCULAR | Status: AC
  Administered 2024-08-26: 4 mg via INTRAVENOUS
  Filled 2024-08-26: qty 2

## 2024-08-26 MED ORDER — TAMSULOSIN HCL 0.4 MG PO CAPS
0.4000 mg | ORAL_CAPSULE | Freq: Every day | ORAL | Status: DC
  Administered 2024-08-26 – 2024-08-29 (×4): 0.4 mg via ORAL
  Filled 2024-08-26 (×4): qty 1

## 2024-08-26 MED ORDER — CLOPIDOGREL BISULFATE 75 MG PO TABS
75.0000 mg | ORAL_TABLET | Freq: Every day | ORAL | Status: DC
  Administered 2024-08-27 – 2024-08-29 (×3): 75 mg via ORAL
  Filled 2024-08-26 (×3): qty 1

## 2024-08-26 MED ORDER — SODIUM CHLORIDE 0.9 % IV SOLN
1.0000 g | INTRAVENOUS | Status: DC
  Administered 2024-08-27: 1 g via INTRAVENOUS
  Filled 2024-08-26: qty 10

## 2024-08-26 MED ORDER — LACTATED RINGERS IV SOLN: INTRAVENOUS | Status: DC

## 2024-08-26 MED ORDER — ONDANSETRON HCL 4 MG PO TABS: 4.0000 mg | ORAL_TABLET | Freq: Four times a day (QID) | ORAL | Status: DC | PRN

## 2024-08-26 MED ORDER — ACETAMINOPHEN 650 MG RE SUPP: 650.0000 mg | Freq: Four times a day (QID) | RECTAL | Status: DC | PRN

## 2024-08-26 MED ORDER — ROSUVASTATIN CALCIUM 10 MG PO TABS
10.0000 mg | ORAL_TABLET | ORAL | Status: DC
  Administered 2024-08-27: 10 mg via ORAL
  Filled 2024-08-26: qty 1

## 2024-08-26 MED ORDER — LACTATED RINGERS IV BOLUS
1000.0000 mL | Freq: Once | INTRAVENOUS | Status: AC
  Administered 2024-08-26: 1000 mL via INTRAVENOUS

## 2024-08-26 MED ORDER — FOLIC ACID 1 MG PO TABS
500.0000 ug | ORAL_TABLET | Freq: Every day | ORAL | Status: DC
  Administered 2024-08-27 – 2024-08-29 (×3): 0.5 mg via ORAL
  Filled 2024-08-26 (×3): qty 1

## 2024-08-26 MED ORDER — ENOXAPARIN SODIUM 40 MG/0.4ML IJ SOSY
40.0000 mg | PREFILLED_SYRINGE | INTRAMUSCULAR | Status: DC
  Administered 2024-08-27 – 2024-08-29 (×3): 40 mg via SUBCUTANEOUS
  Filled 2024-08-26 (×3): qty 0.4

## 2024-08-26 MED ORDER — OMEGA-3-ACID ETHYL ESTERS 1 G PO CAPS
1.0000 g | ORAL_CAPSULE | Freq: Two times a day (BID) | ORAL | Status: DC
  Administered 2024-08-26 – 2024-08-29 (×6): 1 g via ORAL
  Filled 2024-08-26 (×6): qty 1

## 2024-08-26 MED ORDER — OYSTER SHELL CALCIUM/D3 500-5 MG-MCG PO TABS
1.0000 | ORAL_TABLET | Freq: Two times a day (BID) | ORAL | Status: DC
  Administered 2024-08-26 – 2024-08-29 (×6): 1 via ORAL
  Filled 2024-08-26 (×6): qty 1

## 2024-08-26 MED ORDER — ONDANSETRON HCL 4 MG/2ML IJ SOLN: 4.0000 mg | Freq: Four times a day (QID) | INTRAMUSCULAR | Status: DC | PRN

## 2024-08-26 MED ORDER — SENNOSIDES-DOCUSATE SODIUM 8.6-50 MG PO TABS: 1.0000 | ORAL_TABLET | Freq: Every evening | ORAL | Status: DC | PRN

## 2024-08-26 MED ORDER — SODIUM CHLORIDE 0.9 % IV SOLN
1.0000 g | Freq: Once | INTRAVENOUS | Status: AC
  Administered 2024-08-26: 1 g via INTRAVENOUS
  Filled 2024-08-26: qty 10

## 2024-08-26 NOTE — Progress Notes (Signed)
 Plan of Care Note for accepted transfer   Patient: Larry [name] MRN: [id number]   DOA: 08/26/2024  Facility requesting transfer: [facility] Requesting Provider: Pamella Ozell LABOR, DO; Veta Palma, PA-C Reason for transfer: Sepsis, acute cystitis, AKI Facility course: Pt w/ PMH of CAD/MI s/p 3V CABG in 1999, HTN, HLD, lumbar radiculopathy, spinal stenosis, allergic rhinitis and BPH who presented to [facility] for evaluation of dysuria, weakness, fever and decreased p.o. intake over the last 2 to 3 days.  Vitals shows temp of 100.8, RR 14-18, HR 60-90s, SBP 100-110s SpO2 96% on room air  Initial vitals shows sodium 132, bicarb 16, glucose 126, creatinine 1.53, bilirubin 2.2, WBC 21.2, Hgb 16.0, platelet 142, lactic acid 1.1, negative flu, RSV and COVID test, UA shows small hemoglobinuria, negative nitrite, large leuks, WBC >50 and rare bacteria.  CT chest abdomen and pelvis shows signs concerning for cystitis and markedly enlarged prostate gland.   Sepsis protocol was activated and patient received Tylenol  1 g, IV LR 1 L bolus x 2 followed by 150 cc infusion, IV Rocephin  and IV Zofran .  [facility] consulted to admit for sepsis, UTI and AKI  Plan of care: The patient is accepted for admission to Telemetry unit, at [facility]  Admitted for management of sepsis in the setting of urinary tract infection  Author: Claretta CHRISTELLA Alderman, MD 08/26/2024  Check [email or web address] for on-call coverage.  Nursing staff, Please call [facility] Admits & Consults System-Wide number on Amion as soon as patient's arrival, so appropriate admitting provider can evaluate the pt.

## 2024-08-26 NOTE — ED Notes (Signed)
 Pt's wife stated Pt started running a fever yesterday morning temp reading 101's. Pt has been taking tylenol  to help control temps. Pt's last tylenol  given prior to coming to the ED was this morning around 10 am. Pt requested tylenol  for his H/A and was given around 4 pm.

## 2024-08-26 NOTE — Progress Notes (Signed)
 EKG result in the chart.

## 2024-08-26 NOTE — H&P (Addendum)
 History and Physical  LINDBERG ZENON FMW:[id number] DOB: 1932-11-30 DOA: 08/26/2024  PCP: [name], MD   Chief Complaint: Dysuria, decreased urine output, weakness, fever  HPI: Larry [name] is a 89 y.o. male with medical history significant for CAD/MI s/p 3V CABG in 1999, HTN, HLD, lumbar radiculopathy, spinal stenosis, kidney stones, allergic rhinitis and BPH who presented to [facility] for evaluation of dysuria, weakness, fever and decreased p.o. intake. Patient reports that 4-5 days ago he started having some difficulty with urination but over the last 2 days, he has had decreased urine output and dark urine. He endorses a fever, generalized weakness and dry heaving over the last few days. States he has had decreased food and water intake over the last few days due to not feeling well.  He denies any abdominal pain, back pain, shortness of breath, chest pain, hematuria or dizziness.  ED Course: Vitals shows temp of 100.8, RR 14-18, HR 60-90s, SBP 100-110s SpO2 96% on room air. Initial vitals shows sodium 132, bicarb 16, glucose 126, creatinine 1.53, bilirubin 2.2, WBC 21.2, Hgb 16.0, platelet 142, lactic acid 1.1, negative flu, RSV and COVID test, UA shows small hemoglobinuria, negative nitrite, large leuks, WBC >50 and rare bacteria. CT chest abdomen and pelvis shows signs concerning for cystitis and markedly enlarged prostate gland. Sepsis protocol was activated and patient received Tylenol  1 g, IV LR 1 L bolus x 2 followed by 150 cc infusion, IV Rocephin  and IV Zofran .  Patient was admitted to [facility] service and transferred to [facility].  Review of Systems: Please see HPI for pertinent positives and negatives. A complete 10 system review of systems are otherwise negative.  Past Medical History:  Diagnosis Date   Allergic rhinitis, cause unspecified 03/12/2013   Allergy    CAD (coronary artery disease)    S/P CABG in 1999    Cataract    Decreased exercise tolerance    Exertional  fatigue   Degenerative arthritis of hip    Diverticulosis of colon    ED (erectile dysfunction)    Erectile dysfunction 06/03/2014   History of colonic polyps    History of kidney stones    History of MI (myocardial infarction)    Hx of adenomatous colonic polyps 2000   Hyperlipidemia    Low HDL   Lumbar disc disease    Hx of with recent back pain and buttock pain   Myocardial infarction [facility]) 1999   Right knee meniscal tear    S/P removal of thyroid  nodule    Spinal stenosis of lumbar region 03/15/2014   Past Surgical History:  Procedure Laterality Date   BACK SURGERY  1960   CARDIAC CATHETERIZATION     CATARACT EXTRACTION     COLONOSCOPY     CORONARY ARTERY BYPASS GRAFT  1999   CYSTOSCOPY/URETEROSCOPY/HOLMIUM LASER/STENT PLACEMENT Left 12/23/2017   Procedure: CYSTOSCOPY/RETROGRADE/URETEROSCOPY/HOLMIUM LASER/STENT PLACEMENT;  Surgeon: Nieves Cough, MD;  Location: [facility] OR[facility];  Service: Urology;  Laterality: Left;  ONLY NEEDS 60 MIN   EYE SURGERY Bilateral    bilaterl cataract removal   LUMBAR DISC SURGERY     LUMBAR LAMINECTOMY/DECOMPRESSION MICRODISCECTOMY Bilateral 07/06/2021   Procedure: Bilateral Lumbar three-four Laminectomy and foraminotomy;  Surgeon: Colon Shove, MD;  Location: [facility];  Service: Neurosurgery;  Laterality: Bilateral;   Nodules on Thyroid      POLYPECTOMY     PTCA     SHOULDER ARTHROSCOPY WITH ROTATOR CUFF REPAIR AND SUBACROMIAL DECOMPRESSION Right 01/30/2015   Procedure: RIGHT  ARTHROSCOPY SHOULDER SUBACROMIAL DECOMPRESSION DISTAL CLAVICAL RESECTION RETATOR CUFF REPAIR;  Surgeon: Franky Pointer, MD;  Location: [facility] OR;  Service: Orthopedics;  Laterality: Right;   TONSILLECTOMY     VASECTOMY     Social History:  reports that he has never smoked. He has never been exposed to tobacco smoke. He has never used smokeless tobacco. He reports that he does not currently use alcohol. He reports that he does not use drugs.  Allergies  Allergen Reactions   Beta  Adrenergic Blockers Other (See Comments)    Dropped blood pressure too low   Simvastatin Other (See Comments)    Muscle pain    Family History  Problem Relation Age of Onset   Prostate cancer Other    Prostate cancer Other    Heart disease Mother    Heart disease Brother    Colon cancer Neg Hx    Rectal cancer Neg Hx    Stomach cancer Neg Hx      Prior to Admission medications   Medication Sig Start Date End Date Taking? Authorizing Provider  acetaminophen  (TYLENOL ) 500 MG tablet Take 500-1,000 mg by mouth every 8 (eight) hours as needed for mild pain (pain score 1-3) (or headaches).   Yes [provider]  calcium -vitamin D  (OSCAL WITH D) 500-200 MG-UNIT per tablet Take 1 tablet by mouth 2 (two) times daily.   Yes [provider]  clopidogrel  (PLAVIX ) 75 MG tablet Take 1 tablet (75 mg total) by mouth daily. 10/28/23  Yes [name], [name], PA-C  fish oil-omega-3 fatty acids 1000 MG capsule Take 1 g by mouth 2 (two) times daily.   Yes [provider]  folic acid  (FOLVITE ) 400 MCG tablet Take 400 mcg by mouth daily.   Yes [provider]  Multiple Vitamins-Minerals (CENTRUM SILVER ULTRA MENS PO) Take 1 tablet by mouth daily with breakfast.   Yes [provider]  nystatin  (MYCOSTATIN /NYSTOP ) powder Use as directed twice per day as needed to affected area Patient taking differently: Apply 1 Application topically See admin instructions. Apply as directed twice a day as needed to affected area 10/07/23  Yes [name], MD  rosuvastatin  (CRESTOR ) 5 MG tablet Take 1 tablet daily alternating with 2 tablets every other day Patient taking differently: Take 5-10 mg by mouth See admin instructions. Take 5 mg by mouth at bedtime, ALTERNATING WITH 10 mg every other night 10/28/23  Yes Lucien, [name], PA-C  tamsulosin  (FLOMAX ) 0.4 MG CAPS capsule TAKE 1 CAPSULE DAILY 02/27/24  Yes [name], MD    Physical Exam: BP (!) 105/55 (BP Location: Right Arm)    Pulse 65   Temp 98.2 F (36.8 C) (Oral)   Resp 20   SpO2 97%  General: Pleasant, weak appearing elderly man laying in bed. No acute distress. HEENT: [place]/AT. Anicteric sclera. Dry mucous membrane. CV: RRR. No murmurs, rubs, or gallops. No LE edema Pulmonary: Lungs CTAB. Normal effort. No wheezing or rales. Abdominal: Soft, nontender, nondistended. Normal bowel sounds. Extremities: Palpable radial and DP pulses. Normal ROM. Skin: Warm and dry. No obvious rash or lesions. Decreased skin turgor. Neuro: A&Ox3. Moves all extremities. Normal sensation to light touch. No focal deficit. Psych: Normal mood and affect          Labs on Admission:  Basic Metabolic Panel: Recent Labs  Lab 08/26/24 1359  NA 132*  K 4.5  CL 101  CO2 16*  GLUCOSE 126*  BUN 23  CREATININE 1.53*  CALCIUM  9.6  Liver Function Tests: Recent Labs  Lab 08/26/24 1359  AST 42*  ALT 22  ALKPHOS 60  BILITOT 2.2*  PROT 7.3  ALBUMIN 3.8   No results for input(s): LIPASE, AMYLASE in the last 168 hours. No results for input(s): AMMONIA in the last 168 hours. CBC: Recent Labs  Lab 08/26/24 1359  WBC 21.2*  HGB 16.0  HCT 46.4  MCV 91.9  PLT 142*   Cardiac Enzymes: No results for input(s): CKTOTAL, CKMB, CKMBINDEX, TROPONINI in the last 168 hours. BNP (last 3 results) No results for input(s): BNP in the last 8760 hours.  ProBNP (last 3 results) No results for input(s): PROBNP in the last 8760 hours.  CBG: No results for input(s): GLUCAP in the last 168 hours.  Radiological Exams on Admission: CT CHEST ABDOMEN PELVIS W CONTRAST Result Date: 08/26/2024 CLINICAL DATA:  Oliguria associated left lower quadrant pain and nausea EXAM: CT CHEST, ABDOMEN, AND PELVIS WITH CONTRAST TECHNIQUE: Multidetector CT imaging of the chest, abdomen and pelvis was performed following the standard protocol during bolus administration of intravenous contrast. RADIATION DOSE REDUCTION: This exam was  performed according to the departmental dose-optimization program which includes automated exposure control, adjustment of the mA and/or kV according to patient size and/or use of iterative reconstruction technique. CONTRAST:  80mL OMNIPAQUE  IOHEXOL  300 MG/ML  SOLN COMPARISON:  CTA chest dated 04/22/2020, CT abdomen and pelvis dated 11/22/2017 FINDINGS: CT CHEST FINDINGS Cardiovascular: Normal heart size. No significant pericardial fluid/thickening. Unchanged ascending thoracic aortic aneurysm measuring 4.5 x 4.4 cm. No central pulmonary emboli. Coronary artery calcifications. Mediastinum/Nodes: Imaged thyroid  gland without nodules meeting criteria for imaging follow-up by size. Normal esophagus. No pathologically enlarged axillary, supraclavicular, mediastinal, or hilar lymph nodes. Lungs/Pleura: The central airways are patent. Increased compressive atelectasis of the right middle and lower lobe secondary to asymmetric elevation of the right hemidiaphragm. No focal consolidation. No pneumothorax. No pleural effusion. Musculoskeletal: No acute or abnormal lytic or blastic osseous lesions. Median sternotomy wires are nondisplaced. CT ABDOMEN PELVIS FINDINGS Hepatobiliary: Unchanged subcentimeter hypodensity at the hepatic dome (2:43), too small to characterize but likely a small cyst. An additional subcentimeter hypodensity in segment 2/3 (2:55) is also too small to characterize but also likely a cyst. No intra or extrahepatic biliary ductal dilation. Normal gallbladder. Pancreas: No focal lesions or main ductal dilation. Spleen: Normal in size without focal abnormality. Adrenals/Urinary Tract: No adrenal nodules. No suspicious renal mass, calculi, or hydronephrosis. 4.3 cm simple attenuation cyst in the lower pole left kidney demonstrates thin septal calcification (2:89). No specific follow-up imaging recommended. Decompressed urinary bladder demonstrates mild circumferential mural thickening and pericystic  stranding. Stomach/Bowel: Normal appearance of the stomach. No evidence of bowel wall thickening, distention, or inflammatory changes. Colonic diverticulosis without acute diverticulitis. Normal appendix. Vascular/Lymphatic: Aortic atherosclerosis. No enlarged abdominal or pelvic lymph nodes. Reproductive: Markedly enlarged prostate gland. Other: No free fluid, fluid collection, or free air. 7 mm nodular focus located in the anterior right hemipelvis (2:116). Musculoskeletal: No acute or abnormal lytic or blastic osseous findings. Multilevel degenerative changes of the lumbar spine. Small fat-containing left inguinal hernia. IMPRESSION: 1. Decompressed urinary bladder demonstrates mild circumferential mural thickening and pericystic stranding, which may be seen in the setting of cystitis. 2. Markedly enlarged prostate gland. 3. A 7 mm nodular focus located in the anterior right hemipelvis is nonspecific and may represent a small lymph node. 4. Unchanged ascending thoracic aortic aneurysm measuring 4.5 x 4.4 cm. Ascending thoracic aortic aneurysm. Recommend semi-annual imaging followup by  CTA or MRA and referral to cardiothoracic surgery if not already obtained. This recommendation follows 2010 ACCF/AHA/AATS/ACR/ASA/SCA/SCAI/SIR/STS/SVM Guidelines for the Diagnosis and Management of Patients With Thoracic Aortic Disease. Circulation. 2010; 121: Z733-z630. Aortic aneurysm NOS (ICD10-I71.9) 5. Aortic Atherosclerosis (ICD10-I70.0). Coronary artery calcifications. Assessment for potential risk factor modification, dietary therapy or pharmacologic therapy may be warranted, if clinically indicated. Electronically Signed   By: Limin  Xu M.D.   On: 08/26/2024 16:33   Assessment/Plan Larry [name] is a 89 y.o. male with medical history significant for CAD/MI s/p 3V CABG in 1999, HTN, HLD, lumbar radiculopathy, spinal stenosis, allergic rhinitis and BPH who presented to [facility] for evaluation of dysuria, weakness,  fever and decreased p.o. intake and admitted for sepsis secondary to UTI  # Sepsis # Acute cystitis - Presented with a few days of urinary symptoms, fever, decreased p.o. intake and generalized weakness - Urinalysis shows signs of infection and abdominal imaging shows mild circumferential mural thickening and pericystic stranding concerning for cystitis - Patient hemodynamically stable, at baseline mental status - Met sepsis criteria with fever, leukocytosis and evidence of urinary infection - Continue IV Rocephin  - IV hydration with IVLR @ 150 cc/hr - Follow-up blood and urine cultures - Trend CBC, fever curve  # AKI # Hyponatremia, mild # Metabolic acidosis - Creatinine elevated to 1.53, from normal baseline of 1.0-1.1, sodium slightly low at 132, bicarb 16 - Likely secondary to dehydration in the setting of decreased p.o. intake - Continue IV LR at 150 cc/hr - Trend renal function - Avoid nephrotoxic meds  # HTN - BP slightly soft with SBP in the 100-110s - Continue IV hydration as above  # CAD/MI # HLD - S/p 3v CABG in 1989 - Continue Plavix ,  rosuvastatin  and omega-3 fatty acid  # BPH - Continue tamsulosin   # Generalized weakness - In the setting of acute illness and dehydration - PT/OT eval and treat   DVT prophylaxis: Lovenox      Code Status: Limited: Do not attempt resuscitation (DNR) -DNR-LIMITED -Do Not Intubate/DNI   Consults called: None  Family Communication: No family at bedside  Severity of Illness: The appropriate patient status for this patient is INPATIENT. Inpatient status is judged to be reasonable and necessary in order to provide the required intensity of service to ensure the patient's safety. The patient's presenting symptoms, physical exam findings, and initial radiographic and laboratory data in the context of their chronic comorbidities is felt to place them at high risk for further clinical deterioration. Furthermore, it is not anticipated  that the patient will be medically stable for discharge from the hospital within 2 midnights of admission.   * I certify that at the point of admission it is my clinical judgment that the patient will require inpatient hospital care spanning beyond 2 midnights from the point of admission due to high intensity of service, high risk for further deterioration and high frequency of surveillance required.*  Level of care: Telemetry   This record has been created using Conservation officer, historic buildings. Errors have been sought and corrected, but may not always be located. Such creation errors do not reflect on the standard of care.   Lou Claretta HERO, MD 08/26/2024, 11:15 PM [facility] Pager: [phone] Isaiah 41:10   If 7PM-7AM, please contact night-coverage [email or web address] Password [email or web address]

## 2024-08-26 NOTE — ED Notes (Signed)
 Patient transported to CT

## 2024-08-26 NOTE — Progress Notes (Signed)
 Elink following for sepsis protocol. Sepsis protocol & blood cultures ordered after antibiotic already given.

## 2024-08-26 NOTE — ED Triage Notes (Signed)
 Pt c/o diffculty with voiding for past 2-3 days, along with weakness and dry heaves since yesterday. Pt states he has been eating and drinking per his norm, but is barely producing any urine despite frequency and that his urine is very dark.  Pt h/o kidney stones.  Denies back pain, reports very mild LLQ pain.

## 2024-08-26 NOTE — ED Provider Notes (Signed)
 [facility] AT [facility] Provider Note   CSN: [id number] Arrival date & time: 08/26/24  1302     Patient presents with: Urinary Retention   Larry [name] is a 89 y.o. male with history of MI, hyperlipidemia, hypertension, presents with concern for chills and fever that started yesterday.  He reports home temperature readings of 101.   For the past couple days, he has had decreased urinary output and dysuria.  He also reports he has not been eating or drinking much over the last couple days due to his nausea.  Denies any abdominal pain, nausea, or vomiting.  Denies any sore throat or cough.   HPI     Prior to Admission medications   Medication Sig Start Date End Date Taking? Authorizing Provider  Acetaminophen  (TYLENOL ) 325 MG CAPS Take as needed    [provider]  calcium -vitamin D  (OSCAL WITH D) 500-200 MG-UNIT per tablet Take 1 tablet by mouth 2 (two) times daily.    [provider]  cetirizine (ZYRTEC) 10 MG tablet Take 10 mg by mouth daily.    [provider]  clopidogrel  (PLAVIX ) 75 MG tablet Take 1 tablet (75 mg total) by mouth daily. 10/28/23   [name], PA-C  fish oil-omega-3 fatty acids 1000 MG capsule Take 1 g by mouth 2 (two) times daily.    [provider]  folic acid  (FOLVITE ) 400 MCG tablet Take 400 mcg by mouth daily.    [provider]  Multiple Vitamin (MULTIVITAMIN WITH MINERALS) TABS tablet Take 1 tablet by mouth daily. Centrum Silver    [provider]  nystatin  (MYCOSTATIN /NYSTOP ) powder Use as directed twice per day as needed to affected area 10/07/23   [name], MD  rosuvastatin  (CRESTOR ) 5 MG tablet Take 1 tablet daily alternating with 2 tablets every other day 10/28/23   [name], PA-C  tamsulosin  (FLOMAX ) 0.4 MG CAPS capsule TAKE 1 CAPSULE DAILY 02/27/24   [name], MD    Allergies: Beta adrenergic blockers and Simvastatin    Review of Systems   Constitutional:  Positive for chills and fever.    Updated Vital Signs BP (!) 109/59 (BP Location: Right Arm)   Pulse 81   Temp (!) 100.8 F (38.2 C) (Oral)   Resp 18   SpO2 96%   Physical Exam Vitals and nursing note reviewed.  Constitutional:      General: He is not in acute distress.    Appearance: He is well-developed.  HENT:     Head: Normocephalic and atraumatic.  Eyes:     Conjunctiva/sclera: Conjunctivae normal.  Cardiovascular:     Rate and Rhythm: Normal rate and regular rhythm.     Heart sounds: No murmur heard. Pulmonary:     Effort: Pulmonary effort is normal. No respiratory distress.     Breath sounds: Normal breath sounds.  Abdominal:     Palpations: Abdomen is soft.     Tenderness: There is no abdominal tenderness. There is no right CVA tenderness or left CVA tenderness.     Comments: Urine visualized at urinal at bedside, dark amber in color  Musculoskeletal:        General: No swelling.     Cervical back: Neck supple.  Skin:    General: Skin is warm and dry.     Capillary Refill: Capillary refill takes less than 2 seconds.  Neurological:     Mental Status: He is alert.  Psychiatric:  Mood and Affect: Mood normal.     (all labs ordered are listed, but only abnormal results are displayed) Labs Reviewed  URINALYSIS, ROUTINE W REFLEX MICROSCOPIC - Abnormal; Notable for the following components:      Result Value   APPearance HAZY (*)    Hgb urine dipstick SMALL (*)    Protein, ur TRACE (*)    Leukocytes,Ua LARGE (*)    Bacteria, UA RARE (*)    All other components within normal limits  CBC - Abnormal; Notable for the following components:   WBC 21.2 (*)    Platelets 142 (*)    All other components within normal limits  COMPREHENSIVE METABOLIC PANEL WITH GFR - Abnormal; Notable for the following components:   Sodium 132 (*)    CO2 16 (*)    Glucose, Bld 126 (*)    Creatinine, Ser 1.53 (*)    AST 42 (*)    Total Bilirubin 2.2 (*)     GFR, Estimated 43 (*)    All other components within normal limits  RESP PANEL BY RT-PCR (RSV, FLU A&B, COVID)  RVPGX2  CULTURE, BLOOD (ROUTINE X 2)  CULTURE, BLOOD (ROUTINE X 2)  URINE CULTURE  LACTIC ACID, PLASMA    EKG: None  Radiology: CT CHEST ABDOMEN PELVIS W CONTRAST Result Date: 08/26/2024 CLINICAL DATA:  Oliguria associated left lower quadrant pain and nausea EXAM: CT CHEST, ABDOMEN, AND PELVIS WITH CONTRAST TECHNIQUE: Multidetector CT imaging of the chest, abdomen and pelvis was performed following the standard protocol during bolus administration of intravenous contrast. RADIATION DOSE REDUCTION: This exam was performed according to the departmental dose-optimization program which includes automated exposure control, adjustment of the mA and/or kV according to patient size and/or use of iterative reconstruction technique. CONTRAST:  80mL OMNIPAQUE  IOHEXOL  300 MG/ML  SOLN COMPARISON:  CTA chest dated 04/22/2020, CT abdomen and pelvis dated 11/22/2017 FINDINGS: CT CHEST FINDINGS Cardiovascular: Normal heart size. No significant pericardial fluid/thickening. Unchanged ascending thoracic aortic aneurysm measuring 4.5 x 4.4 cm. No central pulmonary emboli. Coronary artery calcifications. Mediastinum/Nodes: Imaged thyroid  gland without nodules meeting criteria for imaging follow-up by size. Normal esophagus. No pathologically enlarged axillary, supraclavicular, mediastinal, or hilar lymph nodes. Lungs/Pleura: The central airways are patent. Increased compressive atelectasis of the right middle and lower lobe secondary to asymmetric elevation of the right hemidiaphragm. No focal consolidation. No pneumothorax. No pleural effusion. Musculoskeletal: No acute or abnormal lytic or blastic osseous lesions. Median sternotomy wires are nondisplaced. CT ABDOMEN PELVIS FINDINGS Hepatobiliary: Unchanged subcentimeter hypodensity at the hepatic dome (2:43), too small to characterize but likely a small cyst. An  additional subcentimeter hypodensity in segment 2/3 (2:55) is also too small to characterize but also likely a cyst. No intra or extrahepatic biliary ductal dilation. Normal gallbladder. Pancreas: No focal lesions or main ductal dilation. Spleen: Normal in size without focal abnormality. Adrenals/Urinary Tract: No adrenal nodules. No suspicious renal mass, calculi, or hydronephrosis. 4.3 cm simple attenuation cyst in the lower pole left kidney demonstrates thin septal calcification (2:89). No specific follow-up imaging recommended. Decompressed urinary bladder demonstrates mild circumferential mural thickening and pericystic stranding. Stomach/Bowel: Normal appearance of the stomach. No evidence of bowel wall thickening, distention, or inflammatory changes. Colonic diverticulosis without acute diverticulitis. Normal appendix. Vascular/Lymphatic: Aortic atherosclerosis. No enlarged abdominal or pelvic lymph nodes. Reproductive: Markedly enlarged prostate gland. Other: No free fluid, fluid collection, or free air. 7 mm nodular focus located in the anterior right hemipelvis (2:116). Musculoskeletal: No acute or abnormal lytic or blastic  osseous findings. Multilevel degenerative changes of the lumbar spine. Small fat-containing left inguinal hernia. IMPRESSION: 1. Decompressed urinary bladder demonstrates mild circumferential mural thickening and pericystic stranding, which may be seen in the setting of cystitis. 2. Markedly enlarged prostate gland. 3. A 7 mm nodular focus located in the anterior right hemipelvis is nonspecific and may represent a small lymph node. 4. Unchanged ascending thoracic aortic aneurysm measuring 4.5 x 4.4 cm. Ascending thoracic aortic aneurysm. Recommend semi-annual imaging followup by CTA or MRA and referral to cardiothoracic surgery if not already obtained. This recommendation follows 2010 ACCF/AHA/AATS/ACR/ASA/SCA/SCAI/SIR/STS/SVM Guidelines for the Diagnosis and Management of Patients With  Thoracic Aortic Disease. Circulation. 2010; 121: Z733-z630. Aortic aneurysm NOS (ICD10-I71.9) 5. Aortic Atherosclerosis (ICD10-I70.0). Coronary artery calcifications. Assessment for potential risk factor modification, dietary therapy or pharmacologic therapy may be warranted, if clinically indicated. Electronically Signed   By: Limin  Xu M.D.   On: 08/26/2024 16:33     .Critical Care  Performed by: Veta Palma, PA-C Authorized by: Veta Palma, PA-C   Critical care provider statement:    Critical care time (minutes):  30   Critical care was necessary to treat or prevent imminent or life-threatening deterioration of the following conditions:  Sepsis   Critical care was time spent personally by me on the following activities:  Development of treatment plan with patient or surrogate, discussions with consultants, evaluation of patient's response to treatment, examination of patient, ordering and review of laboratory studies, ordering and review of radiographic studies, ordering and performing treatments and interventions, pulse oximetry, re-evaluation of patient's condition, review of old charts and obtaining history from patient or surrogate   Care discussed with: admitting provider      Medications Ordered in the ED  lactated ringers  infusion (has no administration in time range)  lactated ringers  bolus 1,000 mL ( Intravenous Stopped 08/26/24 1708)  ondansetron  (ZOFRAN ) injection 4 mg (4 mg Intravenous Given 08/26/24 1543)  iohexol  (OMNIPAQUE ) 300 MG/ML solution 80 mL (80 mLs Intravenous Contrast Given 08/26/24 1546)  acetaminophen  (TYLENOL ) tablet 1,000 mg (1,000 mg Oral Given 08/26/24 1607)  cefTRIAXone  (ROCEPHIN ) 1 g in sodium chloride  0.9 % 100 mL IVPB (0 g Intravenous Stopped 08/26/24 1745)  lactated ringers  bolus 1,000 mL (1,000 mLs Intravenous New Bag/Given 08/26/24 1920)    Clinical Course as of 08/26/24 2006  Sun Aug 26, 2024  1527 Patient has leukocytosis, but no other signs of  systemic infection such as tachycardia, fever, no hypotension.  Does not appear septic at this time. [AF]  1722 Patient temperature re-checked by nursing, 100.8. Given leukocytosis and urinalysis with signs of infection, will start sepsis orders.  [AF]  1824 NT reports bladder scan only about 80ml upon arrival [AF]  1904 Spoke to Dr. Lou with hospitalist service, recommended admission [AF]    Clinical Course User Index [AF] Veta Palma, PA-C                                 Medical Decision Making Amount and/or Complexity of Data Reviewed Labs: ordered. Radiology: ordered.  Risk OTC drugs. Prescription drug management. Decision regarding hospitalization.     Differential diagnosis includes but is not limited to acute cholecystitis, cholelithiasis, cholangitis, choledocholithiasis, peptic ulcer, gastritis, gastroenteritis, appendicitis, IBS, IBD, DKA, nephrolithiasis, UTI, pyelonephritis, pancreatitis, diverticulitis, COVID, flu, RSV, other viral URI, pneumonia   ED Course:  Upon initial evaluation, patient is well-appearing, no acute distress.  Was initially afebrile upon arrival  at 98.9, but did receive Tylenol  prior to arrival at home for headache and fever.  No other vital sign abnormalities upon arrival.  Lungs good auscultation bilaterally.  Abdomen is soft and nontender.  He does report decreased urinary output, and urine in sample cup does appear very concentrated.  Bladder scan with 80 mL, no concern for acute urinary retention at this time.  Labs Ordered: I Ordered, and personally interpreted labs.  The pertinent results include:   CBC with leukocytosis of 21.2 CMP with hyponatremia at 132, elevated creatinine 1.53, up from baseline of 1.14.  T. bili 2.2.  AST mildly elevated at 42.  No elevations in ALT or alk phos.   Urinalysis with hazy appearance, large amount of white blood cells and bacteria noted. Lactic acid within normal limits COVID, flu, RSV  negative  Imaging Studies ordered: I ordered imaging studies including CT chest abdomen and pelvis I independently visualized the imaging with scope of interpretation limited to determining acute life threatening conditions related to emergency care. Imaging showed  IMPRESSION:  1. Decompressed urinary bladder demonstrates mild circumferential  mural thickening and pericystic stranding, which may be seen in the  setting of cystitis.  2. Markedly enlarged prostate gland.  3. A 7 mm nodular focus located in the anterior right hemipelvis is  nonspecific and may represent a small lymph node.  4. Unchanged ascending thoracic aortic aneurysm measuring 4.5 x 4.4  cm. Ascending thoracic aortic aneurysm. Recommend semi-annual  imaging followup by CTA or MRA and referral to cardiothoracic  surgery if not already obtained. This recommendation follows 2010  ACCF/AHA/AATS/ACR/ASA/SCA/SCAI/SIR/STS/SVM Guidelines for the  Diagnosis and Management of Patients With Thoracic Aortic Disease.  Circulation. 2010; 121: [id number]. Aortic aneurysm NOS (ICD10-I71.9)  5. Aortic Atherosclerosis (ICD10-I70.0). Coronary artery  calcifications. Assessment for potential risk factor modification,  dietary therapy or pharmacologic therapy may be warranted, if  clinically indicated.   I agree with the radiologist interpretation   Cardiac Monitoring: / EKG: The patient was maintained on a cardiac monitor.  I personally viewed and interpreted the cardiac monitored which showed an underlying rhythm of: Normal sinus rhythm   Consultations Obtained: I requested consultation with the hospitalist Dr. Lou,  and discussed lab and imaging findings as well as pertinent plan - they recommend: admission  Medications Given: 2L LR bolus LR maintenance fluids Tylenol  Zofran  1 g ceftriaxone   Upon re-evaluation, patient remains well-appearing.  He did begin to have a fever here at 100.8.  It seems the Tylenol  from earlier  wore off.  I ordered additional Tylenol  for him for his fever.  Given the fever and leukocytosis, and urine with leukocytes and him having dysuria, this is concerning for sepsis from a UTI.  Sepsis protocol was initiated.  Patient started on LR and ceftriaxone  for suspected urinary source of infection.  The remainder of his workup is reassuring, no other sources of infection noted.  His COVID, flu, and RSV testing is negative.  CT did not show any signs of pneumonia.  No other signs of acute intra-abdominal infection.  Pericystic stranding was noted on CT, which is consistent with patient's urine and symptoms.  His T. bili was elevated at 2.2, but he is denying any right upper quadrant pain, and CT without any acute biliary pathology noted.  He was noting headache earlier, but no nuchal rigidity, low concern for meningitis at this time.  Creatinine elevated at 1.53, he was given fluids as this is suspected from prerenal source. Given his age  and signs of sepsis, I feel patient would benefit from admission. I discussed incidental findings on patient's CT scan with the patient.  He already is aware of his enlarged prostate gland and the thoracic aortic aneurysm.  He states he is currently following with both urology and cardiology.  He states he gets monitoring for his aortic aneurysm.  I recommended that patient review the CT scan findings today with his cardiologist when he gets out of the hospital.  He also is aware of aortic atherosclerosis and is currently on a statin. Recommended he make his PCP aware of this finding.  Impression: Acute cystitis Sepsis  Disposition:  Admission with hospitalist Dr. Lou     This chart was dictated using voice recognition software, Dragon. Despite the best efforts of this provider to proofread and correct errors, errors may still occur which can change documentation meaning.       Final diagnoses:  SIRS (systemic inflammatory response syndrome) (HCC)   Acute cystitis without hematuria    ED Discharge Orders     None          Veta Palma, PA-C 08/26/24 2007    Pamella Sharper A, DO 09/05/24 1223

## 2024-08-27 ENCOUNTER — Other Ambulatory Visit: Payer: Self-pay

## 2024-08-27 ENCOUNTER — Encounter (HOSPITAL_COMMUNITY): Payer: Self-pay | Admitting: Student

## 2024-08-27 DIAGNOSIS — N390 Urinary tract infection, site not specified: Secondary | ICD-10-CM

## 2024-08-27 DIAGNOSIS — A419 Sepsis, unspecified organism: Secondary | ICD-10-CM | POA: Diagnosis not present

## 2024-08-27 LAB — BASIC METABOLIC PANEL WITH GFR
Anion gap: 12 (ref 5–15)
BUN: 16 mg/dL (ref 8–23)
CO2: 21 mmol/L — ABNORMAL LOW (ref 22–32)
Calcium: 8.6 mg/dL — ABNORMAL LOW (ref 8.9–10.3)
Chloride: 101 mmol/L (ref 98–111)
Creatinine, Ser: 1.07 mg/dL (ref 0.61–1.24)
GFR, Estimated: >60 mL/min (ref >60)
Glucose, Bld: 117 mg/dL — ABNORMAL HIGH (ref 70–99)
Potassium: 3.8 mmol/L (ref 3.5–5.1)
Sodium: 135 mmol/L (ref 135–145)

## 2024-08-27 LAB — CBC
HCT: 40.4 % (ref 39.0–52.0)
Hemoglobin: 13.8 g/dL (ref 13.0–17.0)
MCH: 31.4 pg (ref 26.0–34.0)
MCHC: 34.2 g/dL (ref 30.0–36.0)
MCV: 91.8 fL (ref 80.0–100.0)
Platelets: 136 K/uL — ABNORMAL LOW (ref 150–400)
RBC: 4.4 MIL/uL (ref 4.22–5.81)
RDW: 13.8 % (ref 11.5–15.5)
WBC: 15.7 K/uL — ABNORMAL HIGH (ref 4.0–10.5)
nRBC: 0 % (ref 0.0–0.2)

## 2024-08-27 LAB — GLUCOSE, CAPILLARY: Glucose-Capillary: 123 mg/dL — ABNORMAL HIGH (ref 70–99)

## 2024-08-27 MED ORDER — LACTATED RINGERS IV SOLN: INTRAVENOUS | Status: DC

## 2024-08-27 MED ORDER — LACTATED RINGERS IV SOLN: INTRAVENOUS | Status: AC

## 2024-08-27 NOTE — Progress Notes (Signed)
 PROGRESS NOTE  Larry [name]  FMW:[id number] DOB: 06-06-1932 DOA: 08/26/2024 PCP: [name], MD   Brief Narrative: Patient is a 89 year old male with history of coronary artery disease/MI status post CABG, hypertension, hyperlipidemia, spinal stenosis, BPH who presented to the emergency department with complaint of dysuria, weakness, fever, decreased oral intake.  On presentation, he was mildly febrile with a stable blood pressure.  Lab work showed creatinine of 1.5, WBC count of 21.2, lactic acid of 1.1.  UA was suspicious for UTI.  CT abdomen/pelvis was concerning for cystitis, markedly enlarged prostate.  Patient was started on IV fluid, antibiotics, urine culture, blood culture sent.  Currently being managed for sepsis secondary to UTI  Assessment & Plan:  Principal Problem:   Sepsis secondary to UTI [facility]) Active Problems:   Acute cystitis without hematuria   AKI (acute kidney injury) (HCC)   Hyponatremia   Generalized weakness   Metabolic acidosis  Sepsis secondary to UTI: Presented with fever, decreased oral intake, generalized weakness, dysuria.  Imaging suspicious for cystitis.  UA strongly supports UTI.  Continue current antibiotic.  Follow-up urine culture, blood culture.  He was still mildly febrile this morning.  Continue gentle IV fluid. He feels much better today.  AKI/hyponatremia/metabolic acidosis: Kidney function significantly improved with IV fluid.  Hypertension: Current blood pressure stable.  Home medications on hold  History of coronary artery disease/hyperlipidemia: Status post CABG.  On Plavix , statin at home  BPH: On Flomax   Generalized weakness: In the setting of acute illness, dehydration, UTI.  PT/OT consulted        DVT prophylaxis:enoxaparin  (LOVENOX ) injection 40 mg Start: 08/27/24 1000     Code Status: Limited: Do not attempt resuscitation (DNR) -DNR-LIMITED -Do Not Intubate/DNI   Family Communication: Called and discussed with wife [name]  on phone on 9/8  Patient status:Inpatient  Patient is from :Home  Anticipated discharge un:Ynfz  Estimated DC date: 1 to 2 days   Consultants: None  Procedures:None  Antimicrobials:  Anti-infectives (From admission, onward)    Start     Dose/Rate Route Frequency Ordered Stop   08/27/24 1700  cefTRIAXone  (ROCEPHIN ) 1 g in sodium chloride  0.9 % 100 mL IVPB        1 g 200 mL/hr over 30 Minutes Intravenous Every 24 hours 08/26/24 2205     08/26/24 1700  cefTRIAXone  (ROCEPHIN ) 1 g in sodium chloride  0.9 % 100 mL IVPB        1 g 200 mL/hr over 30 Minutes Intravenous  Once 08/26/24 1647 08/26/24 1745       Subjective: Patient seen and examined at bedside today.  He was sitting in the chair.  Appears comfortable this morning.  Denied any dysuria or abdominal pain, nausea or vomiting.  Had a fever of 100.8 Fahrenheit this morning.  He says he feels much better and his weakness has improved.  He was asking when he can go home.  Objective: Vitals:   08/26/24 2040 08/27/24 0115 08/27/24 0122 08/27/24 0502  BP: (!) 105/55  125/61 124/61  Pulse: 65  68 73  Resp: 20  14 16   Temp: 98.2 F (36.8 C)  (!) 100.8 F (38.2 C) 99.9 F (37.7 C)  TempSrc: Oral  Oral   SpO2: 97%  93% 94%  Weight:  80.4 kg    Height:  5' 7 (1.702 m)      Intake/Output Summary (Last 24 hours) at 08/27/2024 0742 Last data filed at 08/27/2024 0500 Gross per 24 hour  Intake 2008.53 ml  Output 1700 ml  Net 308.53 ml   Filed Weights   08/27/24 0115  Weight: 80.4 kg    Examination:  General exam: Overall comfortable, not in distress, pleasant elderly male HEENT: PERRL Respiratory system:  no wheezes or crackles  Cardiovascular system: S1 & S2 heard, RRR.  Gastrointestinal system: Abdomen is nondistended, soft and nontender. Central nervous system: Alert and oriented Extremities: No edema, no clubbing ,no cyanosis Skin: No rashes, no ulcers,no icterus     Data Reviewed: I have personally reviewed  following labs and imaging studies  CBC: Recent Labs  Lab 08/26/24 1359 08/27/24 0418  WBC 21.2* 15.7*  HGB 16.0 13.8  HCT 46.4 40.4  MCV 91.9 91.8  PLT 142* 136*   Basic Metabolic Panel: Recent Labs  Lab 08/26/24 1359 08/27/24 0418  NA 132* 135  K 4.5 3.8  CL 101 101  CO2 16* 21*  GLUCOSE 126* 117*  BUN 23 16  CREATININE 1.53* 1.07  CALCIUM  9.6 8.6*     Recent Results (from the past 240 hours)  Resp panel by RT-PCR (RSV, Flu A&B, Covid) Anterior Nasal Swab     Status: None   Collection Time: 08/26/24  2:10 PM   Specimen: Anterior Nasal Swab  Result Value Ref Range Status   SARS Coronavirus 2 by RT PCR NEGATIVE NEGATIVE Final    Comment: (NOTE) SARS-CoV-2 target nucleic acids are NOT DETECTED.  The SARS-CoV-2 RNA is generally detectable in upper respiratory specimens during the acute phase of infection. The lowest concentration of SARS-CoV-2 viral copies this assay can detect is 138 copies/mL. A negative result does not preclude SARS-Cov-2 infection and should not be used as the sole basis for treatment or other patient management decisions. A negative result may occur with  improper specimen collection/handling, submission of specimen other than nasopharyngeal swab, presence of viral mutation(s) within the areas targeted by this assay, and inadequate number of viral copies(<138 copies/mL). A negative result must be combined with clinical observations, patient history, and epidemiological information. The expected result is Negative.  Fact Sheet for Patients:  BloggerCourse.com  Fact Sheet for Healthcare Providers:  SeriousBroker.it  This test is no t yet approved or cleared by the [place]  FDA and  has been authorized for detection and/or diagnosis of SARS-CoV-2 by FDA under an Emergency Use Authorization (EUA). This EUA will remain  in effect (meaning this test can be used) for the duration of  the COVID-19 declaration under Section 564(b)(1) of the Act, 21 U.S.C.section 360bbb-3(b)(1), unless the authorization is terminated  or revoked sooner.       Influenza A by PCR NEGATIVE NEGATIVE Final   Influenza B by PCR NEGATIVE NEGATIVE Final    Comment: (NOTE) The Xpert Xpress SARS-CoV-2/FLU/RSV plus assay is intended as an aid in the diagnosis of influenza from Nasopharyngeal swab specimens and should not be used as a sole basis for treatment. Nasal washings and aspirates are unacceptable for Xpert Xpress SARS-CoV-2/FLU/RSV testing.  Fact Sheet for Patients: BloggerCourse.com  Fact Sheet for Healthcare Providers: SeriousBroker.it  This test is not yet approved or cleared by the [place]  FDA and has been authorized for detection and/or diagnosis of SARS-CoV-2 by FDA under an Emergency Use Authorization (EUA). This EUA will remain in effect (meaning this test can be used) for the duration of the COVID-19 declaration under Section 564(b)(1) of the Act, 21 U.S.C. section 360bbb-3(b)(1), unless the authorization is terminated or revoked.     Resp Syncytial Virus  by PCR NEGATIVE NEGATIVE Final    Comment: (NOTE) Fact Sheet for Patients: BloggerCourse.com  Fact Sheet for Healthcare Providers: SeriousBroker.it  This test is not yet approved or cleared by the [place]  FDA and has been authorized for detection and/or diagnosis of SARS-CoV-2 by FDA under an Emergency Use Authorization (EUA). This EUA will remain in effect (meaning this test can be used) for the duration of the COVID-19 declaration under Section 564(b)(1) of the Act, 21 U.S.C. section 360bbb-3(b)(1), unless the authorization is terminated or revoked.  Performed at [facility], [address], [place], [place] [address]      Radiology Studies: CT CHEST ABDOMEN PELVIS W  CONTRAST Result Date: 08/26/2024 CLINICAL DATA:  Oliguria associated left lower quadrant pain and nausea EXAM: CT CHEST, ABDOMEN, AND PELVIS WITH CONTRAST TECHNIQUE: Multidetector CT imaging of the chest, abdomen and pelvis was performed following the standard protocol during bolus administration of intravenous contrast. RADIATION DOSE REDUCTION: This exam was performed according to the departmental dose-optimization program which includes automated exposure control, adjustment of the mA and/or kV according to patient size and/or use of iterative reconstruction technique. CONTRAST:  80mL OMNIPAQUE  IOHEXOL  300 MG/ML  SOLN COMPARISON:  CTA chest dated 04/22/2020, CT abdomen and pelvis dated 11/22/2017 FINDINGS: CT CHEST FINDINGS Cardiovascular: Normal heart size. No significant pericardial fluid/thickening. Unchanged ascending thoracic aortic aneurysm measuring 4.5 x 4.4 cm. No central pulmonary emboli. Coronary artery calcifications. Mediastinum/Nodes: Imaged thyroid  gland without nodules meeting criteria for imaging follow-up by size. Normal esophagus. No pathologically enlarged axillary, supraclavicular, mediastinal, or hilar lymph nodes. Lungs/Pleura: The central airways are patent. Increased compressive atelectasis of the right middle and lower lobe secondary to asymmetric elevation of the right hemidiaphragm. No focal consolidation. No pneumothorax. No pleural effusion. Musculoskeletal: No acute or abnormal lytic or blastic osseous lesions. Median sternotomy wires are nondisplaced. CT ABDOMEN PELVIS FINDINGS Hepatobiliary: Unchanged subcentimeter hypodensity at the hepatic dome (2:43), too small to characterize but likely a small cyst. An additional subcentimeter hypodensity in segment 2/3 (2:55) is also too small to characterize but also likely a cyst. No intra or extrahepatic biliary ductal dilation. Normal gallbladder. Pancreas: No focal lesions or main ductal dilation. Spleen: Normal in size without focal  abnormality. Adrenals/Urinary Tract: No adrenal nodules. No suspicious renal mass, calculi, or hydronephrosis. 4.3 cm simple attenuation cyst in the lower pole left kidney demonstrates thin septal calcification (2:89). No specific follow-up imaging recommended. Decompressed urinary bladder demonstrates mild circumferential mural thickening and pericystic stranding. Stomach/Bowel: Normal appearance of the stomach. No evidence of bowel wall thickening, distention, or inflammatory changes. Colonic diverticulosis without acute diverticulitis. Normal appendix. Vascular/Lymphatic: Aortic atherosclerosis. No enlarged abdominal or pelvic lymph nodes. Reproductive: Markedly enlarged prostate gland. Other: No free fluid, fluid collection, or free air. 7 mm nodular focus located in the anterior right hemipelvis (2:116). Musculoskeletal: No acute or abnormal lytic or blastic osseous findings. Multilevel degenerative changes of the lumbar spine. Small fat-containing left inguinal hernia. IMPRESSION: 1. Decompressed urinary bladder demonstrates mild circumferential mural thickening and pericystic stranding, which may be seen in the setting of cystitis. 2. Markedly enlarged prostate gland. 3. A 7 mm nodular focus located in the anterior right hemipelvis is nonspecific and may represent a small lymph node. 4. Unchanged ascending thoracic aortic aneurysm measuring 4.5 x 4.4 cm. Ascending thoracic aortic aneurysm. Recommend semi-annual imaging followup by CTA or MRA and referral to cardiothoracic surgery if not already obtained. This recommendation follows 2010 ACCF/AHA/AATS/ACR/ASA/SCA/SCAI/SIR/STS/SVM Guidelines for the Diagnosis and Management of Patients  With Thoracic Aortic Disease. Circulation. 2010; 121: Z733-z630. Aortic aneurysm NOS (ICD10-I71.9) 5. Aortic Atherosclerosis (ICD10-I70.0). Coronary artery calcifications. Assessment for potential risk factor modification, dietary therapy or pharmacologic therapy may be  warranted, if clinically indicated. Electronically Signed   By: Limin  Xu M.D.   On: 08/26/2024 16:33    Scheduled Meds:  calcium -vitamin D   1 tablet Oral BID   clopidogrel   75 mg Oral Daily   enoxaparin  (LOVENOX ) injection  40 mg Subcutaneous Q24H   folic acid   500 mcg Oral Daily   omega-3 acid ethyl esters  1 g Oral BID   rosuvastatin   10 mg Oral Q48H   rosuvastatin   5 mg Oral Q48H   tamsulosin   0.4 mg Oral Daily   Continuous Infusions:  cefTRIAXone  (ROCEPHIN )  IV     lactated ringers  150 mL/hr at 08/27/24 0048     LOS: 1 day   Ivonne Mustache, MD [facility] P9/07/2024, 7:42 AM

## 2024-08-27 NOTE — Progress Notes (Signed)
 PT Cancellation Note  Patient Details Name: MACHI WHITTAKER MRN: [id number] DOB: [birth year]   Cancelled Treatment:    Reason Eval/Treat Not Completed: Other (comment)  Pt reports just laid down, request therapy later (he stated tomorrow) , discussed would come back later today to see how he felt for PT. Brenner Visconti, PT Acute Rehab [facility] Re[facility] [phone]  Benjiman VEAR Mulberry 08/27/2024, 10:33 AM

## 2024-08-27 NOTE — Plan of Care (Signed)

## 2024-08-27 NOTE — Evaluation (Signed)
 Occupational Therapy Evaluation Patient Details Name: Larry [name] MRN: [id number] DOB: Apr 10, 1932 Today's Date: 08/27/2024   History of Present Illness   Pt is a 89 yr old male who presented 08/26/24 due to Dysuria, decreased urine output, weakness, fever from [facility]. Pt was admitted due to sepsis secondary to UTI.  PMH: CAD/MI s/p 3V CABG in 1999, HTN, HLD, lumbar radiculopathy, spinal stenosis, kidney stones, allergic rhinitis and BPH     Clinical Impressions Pt reported at PLOF they use a SPC vs RW for ambulation but frequently would drive to parks and ambulate. Pt does have a supportive wife (15 yrs younger) that can assist as needed with the return to home.At this time pt requires increase in time with CGA with ambulation with RW, set up with UE and CGA with sitting to standing with LB ADLS.  At this time recommendation for [facility] as pt reported how weak they feel at this time. Acute Occupational Therapy to follow.      If plan is discharge home, recommend the following:   A little help with walking and/or transfers;Assistance with cooking/housework;Assist for transportation;Help with stairs or ramp for entrance     Functional Status Assessment   Patient has had a recent decline in their functional status and demonstrates the ability to make significant improvements in function in a reasonable and predictable amount of time.     Equipment Recommendations   None recommended by OT     Recommendations for Other Services         Precautions/Restrictions   Precautions Precautions: Fall Recall of Precautions/Restrictions: Intact Restrictions Weight Bearing Restrictions Per Provider Order: No     Mobility Bed Mobility Overal bed mobility: Needs Assistance Bed Mobility: Supine to Sit     Supine to sit: Contact guard, HOB elevated, Used rails     General bed mobility comments: noted needed HOB elevated but reported that they have a bed that can change position     Transfers Overall transfer level: Needs assistance Equipment used: Rolling walker (2 wheels) Transfers: Sit to/from Stand Sit to Stand: Contact guard assist           General transfer comment: repeated cues on hand positioning      Balance Overall balance assessment: Needs assistance Sitting-balance support: Feet supported Sitting balance-Leahy Scale: Fair Sitting balance - Comments: intially with a posterior lean but improved while sitting at EOB   Standing balance support: Bilateral upper extremity supported, No upper extremity supported Standing balance-Leahy Scale: Fair Standing balance comment: reliant on walker with ambulation                           ADL either performed or assessed with clinical judgement   ADL Overall ADL's : Needs assistance/impaired Eating/Feeding: Independent;Sitting   Grooming: Wash/dry hands;Set up;Sitting   Upper Body Bathing: Set up;Sitting   Lower Body Bathing: Contact guard assist;Sit to/from stand   Upper Body Dressing : Set up;Sitting   Lower Body Dressing: Contact guard assist;Sit to/from stand   Toilet Transfer: Contact guard assist;Rolling walker (2 wheels)   Toileting- Clothing Manipulation and Hygiene: Contact guard assist;Sit to/from stand       Functional mobility during ADLs: Contact guard assist;Rolling walker (2 wheels)       Vision Baseline Vision/History: 1 Wears glasses Ability to See in Adequate Light: 0 Adequate Patient Visual Report: No change from baseline Vision Assessment?: Wears glasses for reading;Wears glasses for driving  Perception Perception: Within Functional Limits       Praxis Praxis: WFL       Pertinent Vitals/Pain Pain Assessment Pain Assessment: No/denies pain     Extremity/Trunk Assessment Upper Extremity Assessment Upper Extremity Assessment: Generalized weakness   Lower Extremity Assessment Lower Extremity Assessment: Defer to PT evaluation   Cervical /  Trunk Assessment Cervical / Trunk Assessment: Kyphotic   Communication Communication Communication: No apparent difficulties   Cognition Arousal: Alert Behavior During Therapy: WFL for tasks assessed/performed Cognition:  (Pt noted to repeat some information in session and needed repeated cues on sequence with RW)                               Following commands: Intact       Cueing  General Comments   Cueing Techniques: Verbal cues      Exercises     Shoulder Instructions      Home Living Family/patient expects to be discharged to:: Private residence Living Arrangements: Spouse/significant other Available Help at Discharge: Family Type of Home: House Home Access: Stairs to enter Secretary/administrator of Steps: 6 Entrance Stairs-Rails: Left Home Layout: One level     Bathroom Shower/Tub: Producer, television/film/video: Standard Bathroom Accessibility: Yes   Home Equipment: Agricultural consultant (2 wheels);Cane - single point          Prior Functioning/Environment Prior Level of Function : Independent/Modified Independent;Driving             Mobility Comments: Cane vs walker ADLs Comments: indep    OT Problem List: Decreased strength;Decreased activity tolerance;Impaired balance (sitting and/or standing);Decreased safety awareness;Decreased knowledge of use of DME or AE   OT Treatment/Interventions: Self-care/ADL training;Therapeutic exercise;DME and/or AE instruction;Therapeutic activities;Patient/family education;Balance training      OT Goals(Current goals can be found in the care plan section)   Acute Rehab OT Goals Patient Stated Goal: to go home OT Goal Formulation: With patient Time For Goal Achievement: 09/10/24 Potential to Achieve Goals: Good   OT Frequency:  Min 2X/week    Co-evaluation              AM-PAC OT 6 Clicks Daily Activity     Outcome Measure Help from another person eating meals?: None Help from  another person taking care of personal grooming?: None Help from another person toileting, which includes using toliet, bedpan, or urinal?: A Little Help from another person bathing (including washing, rinsing, drying)?: A Little Help from another person to put on and taking off regular upper body clothing?: None Help from another person to put on and taking off regular lower body clothing?: A Little 6 Click Score: 21   End of Session Equipment Utilized During Treatment: Gait belt;Rolling walker (2 wheels) Nurse Communication: Mobility status  Activity Tolerance: Patient tolerated treatment well Patient left: in chair;with call bell/phone within reach;with chair alarm set  OT Visit Diagnosis: Unsteadiness on feet (R26.81);Other abnormalities of gait and mobility (R26.89);Muscle weakness (generalized) (M62.81)                Time: 9260-9192 OT Time Calculation (min): 28 min Charges:  OT General Charges $OT Visit: 1 Visit OT Evaluation $OT Eval Low Complexity: 1 Low OT Treatments $Self Care/Home Management : 8-22 mins  Warrick POUR OTR/L  Acute Rehab Services  [phone] office number   Warrick Berber 08/27/2024, 8:15 AM

## 2024-08-27 NOTE — Evaluation (Signed)
 Physical Therapy Evaluation Patient Details Name: Larry [name] MRN: [id number] DOB: [birth year] Today's Date: 08/27/2024  History of Present Illness  Pt is a 89 yr old male who presented 08/26/24 due to Dysuria, decreased urine output, weakness, fever from [facility]. Pt was admitted due to sepsis secondary to UTI.  PMH: CAD/MI s/p 3V CABG in 1999, HTN, HLD, lumbar radiculopathy, spinal stenosis, kidney stones, allergic rhinitis and BPH  Clinical Impression  Pt admitted with above diagnosis. At baseline, pt lives with his wife.  He is ambulatory with RW and enjoys walking around parks multiple times per week.  Today, pt reports feeling much weaker than baseline, but agreeable to therapy.  Pt was able to ambulate 200' with RW and CGA for safety with all VSS on RA.   Pt currently with functional limitations due to the deficits listed below (see PT Problem List). Pt will benefit from acute skilled PT to increase their independence and safety with mobility to allow discharge.  Pt motivated to improve and wife reports pt very disciplined about exercise and getting mobility back at home.  Likely no immediate PT needs at d/c.  Wife and pt agree that if not progressing on his own , he could f/u with outpt PT as he has in past.          If plan is discharge home, recommend the following: A little help with walking and/or transfers;A little help with bathing/dressing/bathroom;Assistance with cooking/housework;Help with stairs or ramp for entrance   Can travel by private vehicle        Equipment Recommendations None recommended by PT  Recommendations for Other Services       Functional Status Assessment Patient has had a recent decline in their functional status and demonstrates the ability to make significant improvements in function in a reasonable and predictable amount of time.     Precautions / Restrictions Precautions Precautions: Fall      Mobility  Bed Mobility Overal bed mobility: Needs  Assistance Bed Mobility: Supine to Sit, Sit to Supine     Supine to sit: Supervision, Used rails Sit to supine: Min assist, Used rails   General bed mobility comments: light assist for feet back to bed and cues on how to scoot up (more limited by lines)    Transfers Overall transfer level: Needs assistance Equipment used: Rolling walker (2 wheels) Transfers: Sit to/from Stand Sit to Stand: Supervision           General transfer comment: close supervision    Ambulation/Gait Ambulation/Gait assistance: Contact guard assist Gait Distance (Feet): 200 Feet Assistive device: Rolling walker (2 wheels) Gait Pattern/deviations: Step-through pattern, Trunk flexed Gait velocity: decreased but functional     General Gait Details: min cues RW proximity with good carryover  Stairs            Wheelchair Mobility     Tilt Bed    Modified Rankin (Stroke Patients Only)       Balance Overall balance assessment: Needs assistance Sitting-balance support: Feet supported Sitting balance-Leahy Scale: Good Sitting balance - Comments: steady at EOB   Standing balance support: Bilateral upper extremity supported, No upper extremity supported Standing balance-Leahy Scale: Fair Standing balance comment: reliant on walker with ambulation; could static stand without support                             Pertinent Vitals/Pain      Home Living Family/patient expects to  be discharged to:: Private residence Living Arrangements: Spouse/significant other Available Help at Discharge: Family Type of Home: House Home Access: Stairs to enter Entrance Stairs-Rails: Left Entrance Stairs-Number of Steps: 6   Home Layout: One level Home Equipment: Agricultural consultant (2 wheels);Cane - single point      Prior Function Prior Level of Function : Independent/Modified Independent;Driving             Mobility Comments: Likes to walk around at parks; Uses walker most of the time;  Also has sitting ellipitcal and PT exercises at home that he does - wife reports pt very motivated and disciplined about exercising ADLs Comments: independent     Extremity/Trunk Assessment   Upper Extremity Assessment Upper Extremity Assessment: Defer to OT evaluation    Lower Extremity Assessment Lower Extremity Assessment: LLE deficits/detail;RLE deficits/detail RLE Deficits / Details: ROM WFL; MMT hip 4+/5, knee 5/5, ankle 5/5 LLE Deficits / Details: ROM WFL; MMT hip 4+/5, knee 5/5, ankle 5/5    Cervical / Trunk Assessment Cervical / Trunk Assessment: Kyphotic  Communication        Cognition Arousal: Alert Behavior During Therapy: WFL for tasks assessed/performed   PT - Cognitive impairments: No apparent impairments                                 Cueing       General Comments General comments (skin integrity, edema, etc.): Wife present during session.  Pt on RA with VSS    Exercises     Assessment/Plan    PT Assessment Patient needs continued PT services  PT Problem List Decreased strength;Decreased mobility;Decreased activity tolerance;Decreased balance;Decreased knowledge of use of DME       PT Treatment Interventions DME instruction;Therapeutic exercise;Gait training;Stair training;Functional mobility training;Therapeutic activities;Patient/family education;Balance training    PT Goals (Current goals can be found in the Care Plan section)  Acute Rehab PT Goals Patient Stated Goal: return home; back to walking around gardens PT Goal Formulation: With patient/family Time For Goal Achievement: 09/10/24 Potential to Achieve Goals: Good    Frequency       Co-evaluation               AM-PAC PT 6 Clicks Mobility  Outcome Measure Help needed turning from your back to your side while in a flat bed without using bedrails?: A Little Help needed moving from lying on your back to sitting on the side of a flat bed without using bedrails?: A  Little Help needed moving to and from a bed to a chair (including a wheelchair)?: A Little Help needed standing up from a chair using your arms (e.g., wheelchair or bedside chair)?: A Little Help needed to walk in hospital room?: A Little Help needed climbing 3-5 steps with a railing? : A Little 6 Click Score: 18    End of Session Equipment Utilized During Treatment: Gait belt Activity Tolerance: Patient tolerated treatment well Patient left: in bed;with call bell/phone within reach;with bed alarm set Nurse Communication: Mobility status PT Visit Diagnosis: Other abnormalities of gait and mobility (R26.89);Muscle weakness (generalized) (M62.81)    Time: 8853-8797 PT Time Calculation (min) (ACUTE ONLY): 16 min   Charges:   PT Evaluation $PT Eval Low Complexity: 1 Low   PT General Charges $$ ACUTE PT VISIT: 1 Visit         Benjiman, PT Acute Rehab Services [facility] [phone]   Benjiman VEAR Mulberry  08/27/2024, 12:11 PM

## 2024-08-28 DIAGNOSIS — A419 Sepsis, unspecified organism: Secondary | ICD-10-CM | POA: Diagnosis not present

## 2024-08-28 DIAGNOSIS — N390 Urinary tract infection, site not specified: Secondary | ICD-10-CM | POA: Diagnosis not present

## 2024-08-28 LAB — BASIC METABOLIC PANEL WITH GFR
Anion gap: 11 (ref 5–15)
BUN: 13 mg/dL (ref 8–23)
CO2: 23 mmol/L (ref 22–32)
Calcium: 8.7 mg/dL — ABNORMAL LOW (ref 8.9–10.3)
Chloride: 103 mmol/L (ref 98–111)
Creatinine, Ser: 1.05 mg/dL (ref 0.61–1.24)
GFR, Estimated: >60 mL/min (ref >60)
Glucose, Bld: 115 mg/dL — ABNORMAL HIGH (ref 70–99)
Potassium: 3.8 mmol/L (ref 3.5–5.1)
Sodium: 137 mmol/L (ref 135–145)

## 2024-08-28 LAB — URINE CULTURE: Culture: >=100000 — AB

## 2024-08-28 LAB — CBC
HCT: 41.3 % (ref 39.0–52.0)
Hemoglobin: 13.8 g/dL (ref 13.0–17.0)
MCH: 30.6 pg (ref 26.0–34.0)
MCHC: 33.4 g/dL (ref 30.0–36.0)
MCV: 91.6 fL (ref 80.0–100.0)
Platelets: 145 K/uL — ABNORMAL LOW (ref 150–400)
RBC: 4.51 MIL/uL (ref 4.22–5.81)
RDW: 13.5 % (ref 11.5–15.5)
WBC: 10.9 K/uL — ABNORMAL HIGH (ref 4.0–10.5)
nRBC: 0 % (ref 0.0–0.2)

## 2024-08-28 MED ORDER — CEFADROXIL 500 MG PO CAPS
1000.0000 mg | ORAL_CAPSULE | Freq: Two times a day (BID) | ORAL | Status: DC
Start: 2024-08-28 — End: 2024-09-02
  Administered 2024-08-28 – 2024-08-29 (×3): 1000 mg via ORAL
  Filled 2024-08-28 (×3): qty 2

## 2024-08-28 MED ORDER — ORAL CARE MOUTH RINSE: 15.0000 mL | OROMUCOSAL | Status: DC | PRN

## 2024-08-28 NOTE — Plan of Care (Signed)
  Problem: Education: Goal: Knowledge of General Education information will improve Description: Including pain rating scale, medication(s)/side effects and non-pharmacologic comfort measures Outcome: Progressing   Problem: Clinical Measurements: Goal: Ability to maintain clinical measurements within normal limits will improve Outcome: Progressing Goal: Will remain free from infection Outcome: Progressing Goal: Diagnostic test results will improve Outcome: Progressing Goal: Respiratory complications will improve Outcome: Progressing   Problem: Activity: Goal: Risk for activity intolerance will decrease Outcome: Progressing   Problem: Coping: Goal: Level of anxiety will decrease Outcome: Progressing   Problem: Elimination: Goal: Will not experience complications related to urinary retention Outcome: Progressing   Problem: Safety: Goal: Ability to remain free from injury will improve Outcome: Progressing

## 2024-08-28 NOTE — Progress Notes (Signed)
 PROGRESS NOTE  Larry [name]  FMW:[id number] DOB: [birth year] DOA: 08/26/2024 PCP: [name], MD   Brief Narrative: Patient is a 89 year old male with history of coronary artery disease/MI status post CABG, hypertension, hyperlipidemia, spinal stenosis, BPH who presented to the emergency department with complaint of dysuria, weakness, fever, decreased oral intake.  On presentation, he was mildly febrile with a stable blood pressure.  Lab work showed creatinine of 1.5, WBC count of 21.2, lactic acid of 1.1.  UA was suspicious for UTI.  CT abdomen/pelvis was concerning for cystitis, markedly enlarged prostate.  Patient was started on IV fluid, antibiotics, urine culture, blood culture sent.  Urine culture showing Proteus.  Waiting for sensitivity.  Patient's wife does not think that he is ready to go home today.  Likely discharge tomorrow morning, pending sensitivity  Assessment & Plan:  Principal Problem:   Sepsis secondary to UTI [facility]) Active Problems:   Acute cystitis without hematuria   AKI (acute kidney injury) (HCC)   Hyponatremia   Generalized weakness   Metabolic acidosis  Sepsis secondary to UTI: Presented with fever, decreased oral intake, generalized weakness, dysuria.  Imaging suspicious for cystitis.  UA strongly suspicious for  UTI.   He feels much better today.  Leukocytosis has significantly improved.  Afebrile this morning. Urine culture showing Proteus.  Continue current antibiotic.  Follow-up sensitivity  AKI/hyponatremia/metabolic acidosis: Kidney function significantly improved with IV fluid.  Hypertension: Current blood pressure stable.  Home medications on hold  History of coronary artery disease/hyperlipidemia: Status post CABG.  On Plavix , statin at home  BPH: On Flomax   Generalized weakness: In the setting of acute illness, dehydration, UTI.  PT/OT consulted.  Outpatient follow-up recommended        DVT prophylaxis:enoxaparin  (LOVENOX ) injection 40 mg  Start: 08/27/24 1000     Code Status: Limited: Do not attempt resuscitation (DNR) -DNR-LIMITED -Do Not Intubate/DNI   Family Communication: Discussed with wife [name] at bedside on 9/9  Patient status:Inpatient  Patient is from :Home  Anticipated discharge un:Ynfz  Estimated DC date:tomorrow   Consultants: None  Procedures:None  Antimicrobials:  Anti-infectives (From admission, onward)    Start     Dose/Rate Route Frequency Ordered Stop   08/27/24 1700  cefTRIAXone  (ROCEPHIN ) 1 g in sodium chloride  0.9 % 100 mL IVPB        1 g 200 mL/hr over 30 Minutes Intravenous Every 24 hours 08/26/24 2205     08/26/24 1700  cefTRIAXone  (ROCEPHIN ) 1 g in sodium chloride  0.9 % 100 mL IVPB        1 g 200 mL/hr over 30 Minutes Intravenous  Once 08/26/24 1647 08/26/24 1745       Subjective: Patient seen and examined at bedside today.  He feels good.  No fever today.  Did not had a good sleep last night.  Feels overall comfortable.  No abdominal pain, nausea or vomiting.  Wife at bedside.  We discussed about waiting for pending urine culture sensitivity.  Wife does not think that he is ready to go home today.  We discussed about possible discharge tomorrow after we get urine culture sensitivity.  Objective: Vitals:   08/27/24 0905 08/27/24 1216 08/27/24 1944 08/28/24 0532  BP: 110/61 119/68 (!) 118/56 127/77  Pulse: 60 61 80 81  Resp: (!) 21 17 18 16   Temp: 99.6 F (37.6 C) 98.9 F (37.2 C) 98.4 F (36.9 C) 98 F (36.7 C)  TempSrc: Oral Oral    SpO2: 93% 97% 93%  94%  Weight:      Height:        Intake/Output Summary (Last 24 hours) at 08/28/2024 1140 Last data filed at 08/28/2024 0900 Gross per 24 hour  Intake 220 ml  Output 2300 ml  Net -2080 ml   Filed Weights   08/27/24 0115  Weight: 80.4 kg    Examination:  General exam: Overall comfortable, not in distress, pleasant elderly male HEENT: PERRL Respiratory system:  no wheezes or crackles  Cardiovascular system: S1 &  S2 heard, RRR.  Gastrointestinal system: Abdomen is nondistended, soft and nontender. Central nervous system: Alert and oriented Extremities: No edema, no clubbing ,no cyanosis Skin: No rashes, no ulcers,no icterus     Data Reviewed: I have personally reviewed following labs and imaging studies  CBC: Recent Labs  Lab 08/26/24 1359 08/27/24 0418 08/28/24 0448  WBC 21.2* 15.7* 10.9*  HGB 16.0 13.8 13.8  HCT 46.4 40.4 41.3  MCV 91.9 91.8 91.6  PLT 142* 136* 145*   Basic Metabolic Panel: Recent Labs  Lab 08/26/24 1359 08/27/24 0418 08/28/24 0448  NA 132* 135 137  K 4.5 3.8 3.8  CL 101 101 103  CO2 16* 21* 23  GLUCOSE 126* 117* 115*  BUN 23 16 13   CREATININE 1.53* 1.07 1.05  CALCIUM  9.6 8.6* 8.7*     Recent Results (from the past 240 hours)  Resp panel by RT-PCR (RSV, Flu A&B, Covid) Anterior Nasal Swab     Status: None   Collection Time: 08/26/24  2:10 PM   Specimen: Anterior Nasal Swab  Result Value Ref Range Status   SARS Coronavirus 2 by RT PCR NEGATIVE NEGATIVE Final    Comment: (NOTE) SARS-CoV-2 target nucleic acids are NOT DETECTED.  The SARS-CoV-2 RNA is generally detectable in upper respiratory specimens during the acute phase of infection. The lowest concentration of SARS-CoV-2 viral copies this assay can detect is 138 copies/mL. A negative result does not preclude SARS-Cov-2 infection and should not be used as the sole basis for treatment or other patient management decisions. A negative result may occur with  improper specimen collection/handling, submission of specimen other than nasopharyngeal swab, presence of viral mutation(s) within the areas targeted by this assay, and inadequate number of viral copies(<138 copies/mL). A negative result must be combined with clinical observations, patient history, and epidemiological information. The expected result is Negative.  Fact Sheet for Patients:  BloggerCourse.com  Fact Sheet  for Healthcare Providers:  SeriousBroker.it  This test is no t yet approved or cleared by the [place]  FDA and  has been authorized for detection and/or diagnosis of SARS-CoV-2 by FDA under an Emergency Use Authorization (EUA). This EUA will remain  in effect (meaning this test can be used) for the duration of the COVID-19 declaration under Section 564(b)(1) of the Act, 21 U.S.C.section 360bbb-3(b)(1), unless the authorization is terminated  or revoked sooner.       Influenza A by PCR NEGATIVE NEGATIVE Final   Influenza B by PCR NEGATIVE NEGATIVE Final    Comment: (NOTE) The Xpert Xpress SARS-CoV-2/FLU/RSV plus assay is intended as an aid in the diagnosis of influenza from Nasopharyngeal swab specimens and should not be used as a sole basis for treatment. Nasal washings and aspirates are unacceptable for Xpert Xpress SARS-CoV-2/FLU/RSV testing.  Fact Sheet for Patients: BloggerCourse.com  Fact Sheet for Healthcare Providers: SeriousBroker.it  This test is not yet approved or cleared by the [place]  FDA and has been authorized for detection and/or diagnosis of  SARS-CoV-2 by FDA under an Emergency Use Authorization (EUA). This EUA will remain in effect (meaning this test can be used) for the duration of the COVID-19 declaration under Section 564(b)(1) of the Act, 21 U.S.C. section 360bbb-3(b)(1), unless the authorization is terminated or revoked.     Resp Syncytial Virus by PCR NEGATIVE NEGATIVE Final    Comment: (NOTE) Fact Sheet for Patients: BloggerCourse.com  Fact Sheet for Healthcare Providers: SeriousBroker.it  This test is not yet approved or cleared by the [place]  FDA and has been authorized for detection and/or diagnosis of SARS-CoV-2 by FDA under an Emergency Use Authorization (EUA). This EUA will remain in effect (meaning  this test can be used) for the duration of the COVID-19 declaration under Section 564(b)(1) of the Act, 21 U.S.C. section 360bbb-3(b)(1), unless the authorization is terminated or revoked.  Performed at [facility], [address], [place], [place] [address]   Blood culture (routine x 2)     Status: None (Preliminary result)   Collection Time: 08/26/24  5:30 PM   Specimen: BLOOD LEFT FOREARM  Result Value Ref Range Status   Specimen Description   Final    BLOOD LEFT FOREARM Performed at [facility], [address] [place], [place] [address]    Special Requests   Final    BOTTLES DRAWN AEROBIC AND ANAEROBIC Blood Culture adequate volume Performed at [facility], [address], [place], [place] [address]    Culture   Final    NO GROWTH 2 DAYS Performed at [facility], [address] [place], [place] [address]    Report Status PENDING  Incomplete  Urine Culture     Status: Abnormal (Preliminary result)   Collection Time: 08/26/24  7:10 PM   Specimen: Urine, Clean Catch  Result Value Ref Range Status   Specimen Description   Final    URINE, CLEAN CATCH Performed at [facility], [address] [place], [place] [address]    Special Requests   Final    NONE Performed at [facility], [address], [place], [place] [address]    Culture (A)  Final    >=100,000 COLONIES/mL PROTEUS MIRABILIS SUSCEPTIBILITIES TO FOLLOW Performed at [facility], [address] [place], [place] [address]    Report Status PENDING  Incomplete     Radiology Studies: CT CHEST ABDOMEN PELVIS W CONTRAST Result Date: 08/26/2024 CLINICAL DATA:  Oliguria associated left lower quadrant pain and nausea EXAM: CT CHEST, ABDOMEN, AND PELVIS WITH CONTRAST TECHNIQUE: Multidetector CT imaging of the chest, abdomen and pelvis was performed following the standard protocol during bolus  administration of intravenous contrast. RADIATION DOSE REDUCTION: This exam was performed according to the departmental dose-optimization program which includes automated exposure control, adjustment of the mA and/or kV according to patient size and/or use of iterative reconstruction technique. CONTRAST:  80mL OMNIPAQUE  IOHEXOL  300 MG/ML  SOLN COMPARISON:  CTA chest dated 04/22/2020, CT abdomen and pelvis dated 11/22/2017 FINDINGS: CT CHEST FINDINGS Cardiovascular: Normal heart size. No significant pericardial fluid/thickening. Unchanged ascending thoracic aortic aneurysm measuring 4.5 x 4.4 cm. No central pulmonary emboli. Coronary artery calcifications. Mediastinum/Nodes: Imaged thyroid  gland without nodules meeting criteria for imaging follow-up by size. Normal esophagus. No pathologically enlarged axillary, supraclavicular, mediastinal, or hilar lymph nodes. Lungs/Pleura: The central airways are patent. Increased compressive atelectasis of the right middle and lower lobe secondary to asymmetric elevation of the right hemidiaphragm. No focal consolidation. No pneumothorax. No pleural effusion. Musculoskeletal:  No acute or abnormal lytic or blastic osseous lesions. Median sternotomy wires are nondisplaced. CT ABDOMEN PELVIS FINDINGS Hepatobiliary: Unchanged subcentimeter hypodensity at the hepatic dome (2:43), too small to characterize but likely a small cyst. An additional subcentimeter hypodensity in segment 2/3 (2:55) is also too small to characterize but also likely a cyst. No intra or extrahepatic biliary ductal dilation. Normal gallbladder. Pancreas: No focal lesions or main ductal dilation. Spleen: Normal in size without focal abnormality. Adrenals/Urinary Tract: No adrenal nodules. No suspicious renal mass, calculi, or hydronephrosis. 4.3 cm simple attenuation cyst in the lower pole left kidney demonstrates thin septal calcification (2:89). No specific follow-up imaging recommended. Decompressed urinary  bladder demonstrates mild circumferential mural thickening and pericystic stranding. Stomach/Bowel: Normal appearance of the stomach. No evidence of bowel wall thickening, distention, or inflammatory changes. Colonic diverticulosis without acute diverticulitis. Normal appendix. Vascular/Lymphatic: Aortic atherosclerosis. No enlarged abdominal or pelvic lymph nodes. Reproductive: Markedly enlarged prostate gland. Other: No free fluid, fluid collection, or free air. 7 mm nodular focus located in the anterior right hemipelvis (2:116). Musculoskeletal: No acute or abnormal lytic or blastic osseous findings. Multilevel degenerative changes of the lumbar spine. Small fat-containing left inguinal hernia. IMPRESSION: 1. Decompressed urinary bladder demonstrates mild circumferential mural thickening and pericystic stranding, which may be seen in the setting of cystitis. 2. Markedly enlarged prostate gland. 3. A 7 mm nodular focus located in the anterior right hemipelvis is nonspecific and may represent a small lymph node. 4. Unchanged ascending thoracic aortic aneurysm measuring 4.5 x 4.4 cm. Ascending thoracic aortic aneurysm. Recommend semi-annual imaging followup by CTA or MRA and referral to cardiothoracic surgery if not already obtained. This recommendation follows 2010 ACCF/AHA/AATS/ACR/ASA/SCA/SCAI/SIR/STS/SVM Guidelines for the Diagnosis and Management of Patients With Thoracic Aortic Disease. Circulation. 2010; 121: Z733-z630. Aortic aneurysm NOS (ICD10-I71.9) 5. Aortic Atherosclerosis (ICD10-I70.0). Coronary artery calcifications. Assessment for potential risk factor modification, dietary therapy or pharmacologic therapy may be warranted, if clinically indicated. Electronically Signed   By: Limin  Xu M.D.   On: 08/26/2024 16:33    Scheduled Meds:  calcium -vitamin D   1 tablet Oral BID   clopidogrel   75 mg Oral Daily   enoxaparin  (LOVENOX ) injection  40 mg Subcutaneous Q24H   folic acid   500 mcg Oral Daily    omega-3 acid ethyl esters  1 g Oral BID   rosuvastatin   10 mg Oral Q48H   rosuvastatin   5 mg Oral Q48H   tamsulosin   0.4 mg Oral Daily   Continuous Infusions:  cefTRIAXone  (ROCEPHIN )  IV 1 g (08/27/24 1649)     LOS: 2 days   Ivonne Mustache, MD [facility] P9/08/2024, 11:40 AM

## 2024-08-28 NOTE — Progress Notes (Signed)
 Mobility Specialist - Progress Note   08/28/24 1549  Mobility  Activity Ambulated with assistance  Level of Assistance Minimal assist, patient does 75% or more  Assistive Device Front wheel walker  Distance Ambulated (ft) 50 ft  Range of Motion/Exercises Active  Activity Response Tolerated well  Mobility Referral Yes  Mobility visit 1 Mobility  Mobility Specialist Start Time (ACUTE ONLY) 1535  Mobility Specialist Stop Time (ACUTE ONLY) 1549  Mobility Specialist Time Calculation (min) (ACUTE ONLY) 14 min   Pt was found in bed and agreeable to mobilize. Requested assistance to bathroom for BM. Grew fatigued and during hallway ambulation stated needing to urinate which limited distance. At EOS returned to recliner chair with all needs met. Call bell in reach and chair alarm on.   Erminio Leos,  Mobility Specialist Can be reached via Secure Chat

## 2024-08-28 NOTE — Plan of Care (Signed)

## 2024-08-29 ENCOUNTER — Other Ambulatory Visit (HOSPITAL_COMMUNITY): Payer: Self-pay

## 2024-08-29 DIAGNOSIS — A419 Sepsis, unspecified organism: Secondary | ICD-10-CM | POA: Diagnosis not present

## 2024-08-29 DIAGNOSIS — N390 Urinary tract infection, site not specified: Secondary | ICD-10-CM | POA: Diagnosis not present

## 2024-08-29 MED ORDER — CEFADROXIL 500 MG PO CAPS
1000.0000 mg | ORAL_CAPSULE | Freq: Two times a day (BID) | ORAL | 0 refills | Status: AC
  Filled 2024-08-29: qty 16, 4d supply, fill #0

## 2024-08-29 NOTE — Plan of Care (Signed)

## 2024-08-29 NOTE — Progress Notes (Signed)
 Occupational Therapy Treatment Patient Details Name: Larry [name] MRN: [id number] DOB: 1932-09-21 Today's Date: 08/29/2024   History of present illness Pt is a 89 yr old male who presented 08/26/24 due to Dysuria, decreased urine output, weakness, fever from [facility]. Pt was admitted due to sepsis secondary to UTI.  PMH: CAD/MI s/p 3V CABG in 1999, HTN, HLD, lumbar radiculopathy, spinal stenosis, kidney stones, allergic rhinitis and BPH   OT comments  Patient seen for skilled OT session this am. Wife and patient report discharge shortly. OT educated and trained in 5 P's of energy conservation with BADL's, IAD's and functional mobility for integration and carryover to home. Both able to teach back and demo/verbalize understanding. Patient requires continued Acute care hospital level OT services to progress safety and functional performance and allow for discharge.        If plan is discharge home, recommend the following:  A little help with walking and/or transfers;Assistance with cooking/housework;Assist for transportation;Help with stairs or ramp for entrance   Equipment Recommendations  None recommended by OT       Precautions / Restrictions Precautions Precautions: Fall Recall of Precautions/Restrictions: Intact Restrictions Weight Bearing Restrictions Per Provider Order: No        ADL either performed or assessed with clinical judgement   ADL Overall ADL's : Needs assistance/impaired                       Lower Body Dressing Details (indicate cue type and reason): educated and trained in reacher use for out of reach and lower body garment management as needed           Tub/Shower Transfer Details (indicate cue type and reason): training to ensure seated showers with shower seat   General ADL Comments: OT educated and trained in 5 P's of ECT and issued Handout for use for carryover at home with functional mobility and BADL's. Wife present for session and both  performend teach back with no further DME or AE needs identified    Extremity/Trunk Assessment Upper Extremity Assessment Upper Extremity Assessment: Right hand dominant;Overall [facility] for tasks assessed   Lower Extremity Assessment Lower Extremity Assessment: Defer to PT evaluation        Communication Communication Communication: No apparent difficulties   Cognition Arousal: Alert Behavior During Therapy: WFL for tasks assessed/performed Cognition: No apparent impairments                               Following commands: Intact        Cueing   Cueing Techniques: Verbal cues        General Comments wife nd patient able to teach back breathing strategies    Pertinent Vitals/ Pain       Pain Assessment Pain Assessment: No/denies pain   Frequency  Min 2X/week        Progress Toward Goals  OT Goals(current goals can now be found in the care plan section)  Progress towards OT goals: Progressing toward goals  Acute Rehab OT Goals Patient Stated Goal: to get home soon OT Goal Formulation: With patient/family Time For Goal Achievement: 09/10/24 Potential to Achieve Goals: Good ADL Goals Pt Will Perform Lower Body Bathing: with modified independence Pt Will Perform Lower Body Dressing: with modified independence Pt Will Perform Tub/Shower Transfer: with modified independence;shower seat;ambulating;rolling walker  Plan         AM-PAC OT 6 Clicks Daily Activity  Outcome Measure   Help from another person eating meals?: None Help from another person taking care of personal grooming?: None Help from another person toileting, which includes using toliet, bedpan, or urinal?: A Little Help from another person bathing (including washing, rinsing, drying)?: A Little Help from another person to put on and taking off regular upper body clothing?: None Help from another person to put on and taking off regular lower body clothing?: A Little 6 Click Score:  21    End of Session Equipment Utilized During Treatment: Gait belt;Rolling walker (2 wheels)  OT Visit Diagnosis: Unsteadiness on feet (R26.81);Other abnormalities of gait and mobility (R26.89);Muscle weakness (generalized) (M62.81)   Activity Tolerance Patient tolerated treatment well   Patient Left in bed;with call bell/phone within reach;with family/visitor present   Nurse Communication Mobility status        Time: 1120-1140 OT Time Calculation (min): 20 min  Charges: OT General Charges $OT Visit: 1 Visit OT Treatments $Therapeutic Activity: 8-22 mins  Babette Stum OT/L Acute Rehabilitation Department  [phone]  08/29/2024, 12:51 PM

## 2024-08-29 NOTE — Progress Notes (Signed)
 Discharge medication in  a secure bag delivered to pt in room by this RN

## 2024-08-29 NOTE — Progress Notes (Signed)
 Mobility Specialist - Progress Note   08/29/24 1027  Mobility  Activity Ambulated with assistance  Level of Assistance Contact guard assist, steadying assist  Assistive Device Front wheel walker  Distance Ambulated (ft) 350 ft  Range of Motion/Exercises Active  Activity Response Tolerated well  Mobility Referral Yes  Mobility visit 1 Mobility  Mobility Specialist Start Time (ACUTE ONLY) 1010  Mobility Specialist Stop Time (ACUTE ONLY) 1027  Mobility Specialist Time Calculation (min) (ACUTE ONLY) 17 min   Pt was found in bathroom and agreeable to mobilize. Grew fatigued with session and returned to bed with all needs met. Call bell in reach and wife in room.   Erminio Leos,  Mobility Specialist Can be reached via Secure Chat

## 2024-08-29 NOTE — Plan of Care (Signed)
  Problem: Education: Goal: Knowledge of General Education information will improve Description: Including pain rating scale, medication(s)/side effects and non-pharmacologic comfort measures 08/29/2024 1206 by Terance Leonor BRAVO, RN Outcome: Adequate for Discharge 08/29/2024 0826 by Terance Leonor BRAVO, RN Outcome: Progressing   Problem: Health Behavior/Discharge Planning: Goal: Ability to manage health-related needs will improve 08/29/2024 1206 by Terance Leonor BRAVO, RN Outcome: Adequate for Discharge 08/29/2024 0826 by Terance Leonor BRAVO, RN Outcome: Progressing   Problem: Clinical Measurements: Goal: Ability to maintain clinical measurements within normal limits will improve 08/29/2024 1206 by Terance Leonor BRAVO, RN Outcome: Adequate for Discharge 08/29/2024 0826 by Terance Leonor BRAVO, RN Outcome: Progressing Goal: Will remain free from infection 08/29/2024 1206 by Terance Leonor BRAVO, RN Outcome: Adequate for Discharge 08/29/2024 0826 by Terance Leonor BRAVO, RN Outcome: Progressing Goal: Diagnostic test results will improve 08/29/2024 1206 by Terance Leonor BRAVO, RN Outcome: Adequate for Discharge 08/29/2024 0826 by Terance Leonor BRAVO, RN Outcome: Progressing Goal: Respiratory complications will improve 08/29/2024 1206 by Terance Leonor BRAVO, RN Outcome: Adequate for Discharge 08/29/2024 0826 by Terance Leonor BRAVO, RN Outcome: Progressing Goal: Cardiovascular complication will be avoided 08/29/2024 1206 by Terance Leonor BRAVO, RN Outcome: Adequate for Discharge 08/29/2024 0826 by Terance Leonor BRAVO, RN Outcome: Progressing   Problem: Activity: Goal: Risk for activity intolerance will decrease 08/29/2024 1206 by Terance Leonor BRAVO, RN Outcome: Adequate for Discharge 08/29/2024 0826 by Terance Leonor BRAVO, RN Outcome: Progressing   Problem: Nutrition: Goal: Adequate nutrition will be maintained 08/29/2024 1206 by Terance Leonor BRAVO, RN Outcome: Adequate for  Discharge 08/29/2024 0826 by Terance Leonor BRAVO, RN Outcome: Progressing   Problem: Coping: Goal: Level of anxiety will decrease 08/29/2024 1206 by Terance Leonor BRAVO, RN Outcome: Adequate for Discharge 08/29/2024 0826 by Terance Leonor BRAVO, RN Outcome: Progressing   Problem: Elimination: Goal: Will not experience complications related to bowel motility 08/29/2024 1206 by Terance Leonor BRAVO, RN Outcome: Adequate for Discharge 08/29/2024 0826 by Terance Leonor BRAVO, RN Outcome: Progressing Goal: Will not experience complications related to urinary retention 08/29/2024 1206 by Terance Leonor BRAVO, RN Outcome: Adequate for Discharge 08/29/2024 0826 by Terance Leonor BRAVO, RN Outcome: Progressing   Problem: Pain Managment: Goal: General experience of comfort will improve and/or be controlled 08/29/2024 1206 by Terance Leonor BRAVO, RN Outcome: Adequate for Discharge 08/29/2024 0826 by Terance Leonor BRAVO, RN Outcome: Progressing   Problem: Safety: Goal: Ability to remain free from injury will improve 08/29/2024 1206 by Terance Leonor BRAVO, RN Outcome: Adequate for Discharge 08/29/2024 0826 by Terance Leonor BRAVO, RN Outcome: Progressing   Problem: Skin Integrity: Goal: Risk for impaired skin integrity will decrease 08/29/2024 1206 by Terance Leonor BRAVO, RN Outcome: Adequate for Discharge 08/29/2024 0826 by Terance Leonor BRAVO, RN Outcome: Progressing

## 2024-08-29 NOTE — Discharge Summary (Signed)
 Physician Discharge Summary  Larry [name] FMW:[id number] DOB: 07/30/1932 DOA: 08/26/2024  PCP: [name], MD  Admit date: 08/26/2024 Discharge date: 08/29/2024  Admitted From: Home Disposition:  Home  Discharge Condition:Stable CODE STATUS: DNR Diet recommendation:Regular   Brief/Interim Summary: Patient is a 89 year old male with history of coronary artery disease/MI status post CABG, hypertension, hyperlipidemia, spinal stenosis, BPH who presented to the emergency department with complaint of dysuria, weakness, fever, decreased oral intake.  On presentation, he was mildly febrile with a stable blood pressure.  Lab work showed creatinine of 1.5, WBC count of 21.2, lactic acid of 1.1.  UA was suspicious for UTI.  CT abdomen/pelvis was concerning for cystitis, markedly enlarged prostate.  Patient was started on IV fluid, antibiotics, urine culture, blood culture sent.  Urine culture showing Proteus,its pansensitive .  Antibiotic changed  to oral.  Medically stable for discharge home today.  Following problems were addressed during the hospitalization:   Sepsis secondary to UTI: Presented with fever, decreased oral intake, generalized weakness, dysuria.  Imaging suspicious for cystitis.  UA strongly suspicious for  UTI.   He feels much better today.  Leukocytosis has significantly improved.  Afebrile this morning. Urine culture showing pan-senstitive  Proteus. Abx changed to oral   AKI/hyponatremia/metabolic acidosis: Kidney function significantly improved with IV fluid.   Hypertension: Current blood pressure stable.   History of coronary artery disease/hyperlipidemia: Status post CABG.  On Plavix , statin at home   BPH: On Flomax    Generalized weakness: In the setting of acute illness, dehydration, UTI.  PT/OT consulted.  Outpatient follow-up recommended   Discharge Diagnoses:  Principal Problem:   Sepsis secondary to UTI [facility]) Active Problems:   Acute cystitis without  hematuria   AKI (acute kidney injury) (HCC)   Hyponatremia   Generalized weakness   Metabolic acidosis    Discharge Instructions  Discharge Instructions     Diet general   Complete by: As directed    Discharge instructions   Complete by: As directed    1)Please take your medications as instructed 2) Follow up with your PCP/urologist in 1 to 2 weeks   Increase activity slowly   Complete by: As directed       Allergies as of 08/29/2024       Reactions   Beta Adrenergic Blockers Other (See Comments)   Dropped blood pressure too low   Simvastatin Other (See Comments)   Muscle pain        Medication List     TAKE these medications    calcium -vitamin D  500-200 MG-UNIT tablet Commonly known as: OSCAL WITH D Take 1 tablet by mouth 2 (two) times daily.   cefadroxil  500 MG capsule Commonly known as: DURICEF Take 2 capsules (1,000 mg total) by mouth 2 (two) times daily for 4 days.   CENTRUM SILVER ULTRA MENS PO Take 1 tablet by mouth daily with breakfast.   clopidogrel  75 MG tablet Commonly known as: PLAVIX  Take 1 tablet (75 mg total) by mouth daily.   fish oil-omega-3 fatty acids 1000 MG capsule Take 1 g by mouth 2 (two) times daily.   folic acid  400 MCG tablet Commonly known as: FOLVITE  Take 400 mcg by mouth daily.   nystatin  powder Commonly known as: MYCOSTATIN /NYSTOP  Use as directed twice per day as needed to affected area What changed:  how much to take how to take this when to take this additional instructions   rosuvastatin  5 MG tablet Commonly known as: CRESTOR  Take 1 tablet  daily alternating with 2 tablets every other day What changed:  how much to take how to take this when to take this additional instructions   tamsulosin  0.4 MG Caps capsule Commonly known as: FLOMAX  TAKE 1 CAPSULE DAILY   TYLENOL  500 MG tablet Generic drug: acetaminophen  Take 500-1,000 mg by mouth every 8 (eight) hours as needed for mild pain (pain score 1-3) (or  headaches).        Follow-up Information     [name], MD. Schedule an appointment as soon as possible for a visit in 1 week(s).   Specialties: Internal Medicine, Radiology Contact information: [address] [place] [address] [phone]                Allergies  Allergen Reactions   Beta Adrenergic Blockers Other (See Comments)    Dropped blood pressure too low   Simvastatin Other (See Comments)    Muscle pain    Consultations: None   Procedures/Studies: CT CHEST ABDOMEN PELVIS W CONTRAST Result Date: 08/26/2024 CLINICAL DATA:  Oliguria associated left lower quadrant pain and nausea EXAM: CT CHEST, ABDOMEN, AND PELVIS WITH CONTRAST TECHNIQUE: Multidetector CT imaging of the chest, abdomen and pelvis was performed following the standard protocol during bolus administration of intravenous contrast. RADIATION DOSE REDUCTION: This exam was performed according to the departmental dose-optimization program which includes automated exposure control, adjustment of the mA and/or kV according to patient size and/or use of iterative reconstruction technique. CONTRAST:  80mL OMNIPAQUE  IOHEXOL  300 MG/ML  SOLN COMPARISON:  CTA chest dated 04/22/2020, CT abdomen and pelvis dated 11/22/2017 FINDINGS: CT CHEST FINDINGS Cardiovascular: Normal heart size. No significant pericardial fluid/thickening. Unchanged ascending thoracic aortic aneurysm measuring 4.5 x 4.4 cm. No central pulmonary emboli. Coronary artery calcifications. Mediastinum/Nodes: Imaged thyroid  gland without nodules meeting criteria for imaging follow-up by size. Normal esophagus. No pathologically enlarged axillary, supraclavicular, mediastinal, or hilar lymph nodes. Lungs/Pleura: The central airways are patent. Increased compressive atelectasis of the right middle and lower lobe secondary to asymmetric elevation of the right hemidiaphragm. No focal consolidation. No pneumothorax. No pleural effusion. Musculoskeletal:  No acute or abnormal lytic or blastic osseous lesions. Median sternotomy wires are nondisplaced. CT ABDOMEN PELVIS FINDINGS Hepatobiliary: Unchanged subcentimeter hypodensity at the hepatic dome (2:43), too small to characterize but likely a small cyst. An additional subcentimeter hypodensity in segment 2/3 (2:55) is also too small to characterize but also likely a cyst. No intra or extrahepatic biliary ductal dilation. Normal gallbladder. Pancreas: No focal lesions or main ductal dilation. Spleen: Normal in size without focal abnormality. Adrenals/Urinary Tract: No adrenal nodules. No suspicious renal mass, calculi, or hydronephrosis. 4.3 cm simple attenuation cyst in the lower pole left kidney demonstrates thin septal calcification (2:89). No specific follow-up imaging recommended. Decompressed urinary bladder demonstrates mild circumferential mural thickening and pericystic stranding. Stomach/Bowel: Normal appearance of the stomach. No evidence of bowel wall thickening, distention, or inflammatory changes. Colonic diverticulosis without acute diverticulitis. Normal appendix. Vascular/Lymphatic: Aortic atherosclerosis. No enlarged abdominal or pelvic lymph nodes. Reproductive: Markedly enlarged prostate gland. Other: No free fluid, fluid collection, or free air. 7 mm nodular focus located in the anterior right hemipelvis (2:116). Musculoskeletal: No acute or abnormal lytic or blastic osseous findings. Multilevel degenerative changes of the lumbar spine. Small fat-containing left inguinal hernia. IMPRESSION: 1. Decompressed urinary bladder demonstrates mild circumferential mural thickening and pericystic stranding, which may be seen in the setting of cystitis. 2. Markedly enlarged prostate gland. 3. A 7 mm nodular focus located in  the anterior right hemipelvis is nonspecific and may represent a small lymph node. 4. Unchanged ascending thoracic aortic aneurysm measuring 4.5 x 4.4 cm. Ascending thoracic aortic  aneurysm. Recommend semi-annual imaging followup by CTA or MRA and referral to cardiothoracic surgery if not already obtained. This recommendation follows 2010 ACCF/AHA/AATS/ACR/ASA/SCA/SCAI/SIR/STS/SVM Guidelines for the Diagnosis and Management of Patients With Thoracic Aortic Disease. Circulation. 2010; 121: Z733-z630. Aortic aneurysm NOS (ICD10-I71.9) 5. Aortic Atherosclerosis (ICD10-I70.0). Coronary artery calcifications. Assessment for potential risk factor modification, dietary therapy or pharmacologic therapy may be warranted, if clinically indicated. Electronically Signed   By: Limin  Xu M.D.   On: 08/26/2024 16:33      Subjective: Patient seen and examined at bedside today.  Hemodynamically stable.  Overall comfortable. Afebrile, no nausea, vomiting or abdomen pain. medically stable for discharge home today.  Discharge Exam: Vitals:   08/28/24 2047 08/29/24 0524  BP: 132/80 124/81  Pulse: 77 66  Resp: (!) 25 19  Temp: 97.7 F (36.5 C) 97.6 F (36.4 C)  SpO2: 97% 93%   Vitals:   08/28/24 0532 08/28/24 1255 08/28/24 2047 08/29/24 0524  BP: 127/77 (!) 119/59 132/80 124/81  Pulse: 81 85 77 66  Resp: 16 16 (!) 25 19  Temp: 98 F (36.7 C) 98.1 F (36.7 C) 97.7 F (36.5 C) 97.6 F (36.4 C)  TempSrc:  Oral Oral   SpO2: 94% 93% 97% 93%  Weight:      Height:        General: Pt is alert, awake, not in acute distress Cardiovascular: RRR, S1/S2 +, no rubs, no gallops Respiratory: CTA bilaterally, no wheezing, no rhonchi Abdominal: Soft, NT, ND, bowel sounds + Extremities: no edema, no cyanosis    The results of significant diagnostics from this hospitalization (including imaging, microbiology, ancillary and laboratory) are listed below for reference.     Microbiology: Recent Results (from the past 240 hours)  Resp panel by RT-PCR (RSV, Flu A&B, Covid) Anterior Nasal Swab     Status: None   Collection Time: 08/26/24  2:10 PM   Specimen: Anterior Nasal Swab  Result Value  Ref Range Status   SARS Coronavirus 2 by RT PCR NEGATIVE NEGATIVE Final    Comment: (NOTE) SARS-CoV-2 target nucleic acids are NOT DETECTED.  The SARS-CoV-2 RNA is generally detectable in upper respiratory specimens during the acute phase of infection. The lowest concentration of SARS-CoV-2 viral copies this assay can detect is 138 copies/mL. A negative result does not preclude SARS-Cov-2 infection and should not be used as the sole basis for treatment or other patient management decisions. A negative result may occur with  improper specimen collection/handling, submission of specimen other than nasopharyngeal swab, presence of viral mutation(s) within the areas targeted by this assay, and inadequate number of viral copies(<138 copies/mL). A negative result must be combined with clinical observations, patient history, and epidemiological information. The expected result is Negative.  Fact Sheet for Patients:  BloggerCourse.com  Fact Sheet for Healthcare Providers:  SeriousBroker.it  This test is no t yet approved or cleared by the [place]  FDA and  has been authorized for detection and/or diagnosis of SARS-CoV-2 by FDA under an Emergency Use Authorization (EUA). This EUA will remain  in effect (meaning this test can be used) for the duration of the COVID-19 declaration under Section 564(b)(1) of the Act, 21 U.S.C.section 360bbb-3(b)(1), unless the authorization is terminated  or revoked sooner.       Influenza A by PCR NEGATIVE NEGATIVE Final   Influenza  B by PCR NEGATIVE NEGATIVE Final    Comment: (NOTE) The Xpert Xpress SARS-CoV-2/FLU/RSV plus assay is intended as an aid in the diagnosis of influenza from Nasopharyngeal swab specimens and should not be used as a sole basis for treatment. Nasal washings and aspirates are unacceptable for Xpert Xpress SARS-CoV-2/FLU/RSV testing.  Fact Sheet for  Patients: BloggerCourse.com  Fact Sheet for Healthcare Providers: SeriousBroker.it  This test is not yet approved or cleared by the [place]  FDA and has been authorized for detection and/or diagnosis of SARS-CoV-2 by FDA under an Emergency Use Authorization (EUA). This EUA will remain in effect (meaning this test can be used) for the duration of the COVID-19 declaration under Section 564(b)(1) of the Act, 21 U.S.C. section 360bbb-3(b)(1), unless the authorization is terminated or revoked.     Resp Syncytial Virus by PCR NEGATIVE NEGATIVE Final    Comment: (NOTE) Fact Sheet for Patients: BloggerCourse.com  Fact Sheet for Healthcare Providers: SeriousBroker.it  This test is not yet approved or cleared by the [place]  FDA and has been authorized for detection and/or diagnosis of SARS-CoV-2 by FDA under an Emergency Use Authorization (EUA). This EUA will remain in effect (meaning this test can be used) for the duration of the COVID-19 declaration under Section 564(b)(1) of the Act, 21 U.S.C. section 360bbb-3(b)(1), unless the authorization is terminated or revoked.  Performed at [facility], [address], [place], [place] [address]   Blood culture (routine x 2)     Status: None (Preliminary result)   Collection Time: 08/26/24  5:30 PM   Specimen: BLOOD LEFT FOREARM  Result Value Ref Range Status   Specimen Description   Final    BLOOD LEFT FOREARM Performed at [facility], [address], [place], [place] [address]    Special Requests   Final    BOTTLES DRAWN AEROBIC AND ANAEROBIC Blood Culture adequate volume Performed at [facility], [address] [place], [place] [address]    Culture   Final    NO GROWTH 3 DAYS Performed at [facility], [address] [place], [place] [address]    Report  Status PENDING  Incomplete  Urine Culture     Status: Abnormal   Collection Time: 08/26/24  7:10 PM   Specimen: Urine, Clean Catch  Result Value Ref Range Status   Specimen Description   Final    URINE, CLEAN CATCH Performed at [facility], [address], [place], [place] [address]    Special Requests   Final    NONE Performed at [facility], [address], [place], [place] [address]    Culture >=100,000 COLONIES/mL PROTEUS MIRABILIS (A)  Final   Report Status 08/28/2024 FINAL  Final   Organism ID, Bacteria PROTEUS MIRABILIS (A)  Final      Susceptibility   Proteus mirabilis - MIC*    AMPICILLIN <=2 SENSITIVE Sensitive     CEFAZOLIN  (URINE) Value in next row Sensitive      4 SENSITIVEThis is a modified FDA-approved test that has been validated and its performance characteristics determined by the reporting laboratory.  This laboratory is certified under the Clinical Laboratory Improvement Amendments CLIA as qualified to perform high complexity clinical laboratory testing.    CEFEPIME Value in next row Sensitive      4 SENSITIVEThis is a modified FDA-approved test that has been validated and its performance characteristics determined by the reporting laboratory.  This laboratory is certified under the Clinical Laboratory Improvement  Amendments CLIA as qualified to perform high complexity clinical laboratory testing.    ERTAPENEM Value in next row Sensitive      4 SENSITIVEThis is a modified FDA-approved test that has been validated and its performance characteristics determined by the reporting laboratory.  This laboratory is certified under the Clinical Laboratory Improvement Amendments CLIA as qualified to perform high complexity clinical laboratory testing.    CEFTRIAXONE  Value in next row Sensitive      4 SENSITIVEThis is a modified FDA-approved test that has been validated and its performance characteristics determined by the reporting  laboratory.  This laboratory is certified under the Clinical Laboratory Improvement Amendments CLIA as qualified to perform high complexity clinical laboratory testing.    CIPROFLOXACIN  Value in next row Sensitive      4 SENSITIVEThis is a modified FDA-approved test that has been validated and its performance characteristics determined by the reporting laboratory.  This laboratory is certified under the Clinical Laboratory Improvement Amendments CLIA as qualified to perform high complexity clinical laboratory testing.    GENTAMICIN Value in next row Sensitive      4 SENSITIVEThis is a modified FDA-approved test that has been validated and its performance characteristics determined by the reporting laboratory.  This laboratory is certified under the Clinical Laboratory Improvement Amendments CLIA as qualified to perform high complexity clinical laboratory testing.    NITROFURANTOIN Value in next row Resistant      4 SENSITIVEThis is a modified FDA-approved test that has been validated and its performance characteristics determined by the reporting laboratory.  This laboratory is certified under the Clinical Laboratory Improvement Amendments CLIA as qualified to perform high complexity clinical laboratory testing.    TRIMETH/SULFA Value in next row Sensitive      4 SENSITIVEThis is a modified FDA-approved test that has been validated and its performance characteristics determined by the reporting laboratory.  This laboratory is certified under the Clinical Laboratory Improvement Amendments CLIA as qualified to perform high complexity clinical laboratory testing.    AMPICILLIN/SULBACTAM Value in next row Sensitive      4 SENSITIVEThis is a modified FDA-approved test that has been validated and its performance characteristics determined by the reporting laboratory.  This laboratory is certified under the Clinical Laboratory Improvement Amendments CLIA as qualified to perform high complexity clinical laboratory  testing.    PIP/TAZO Value in next row Sensitive ug/mL     <=4 SENSITIVEThis is a modified FDA-approved test that has been validated and its performance characteristics determined by the reporting laboratory.  This laboratory is certified under the Clinical Laboratory Improvement Amendments CLIA as qualified to perform high complexity clinical laboratory testing.    MEROPENEM Value in next row Sensitive      <=4 SENSITIVEThis is a modified FDA-approved test that has been validated and its performance characteristics determined by the reporting laboratory.  This laboratory is certified under the Clinical Laboratory Improvement Amendments CLIA as qualified to perform high complexity clinical laboratory testing.    * >=100,000 COLONIES/mL PROTEUS MIRABILIS     Labs: BNP (last 3 results) No results for input(s): BNP in the last 8760 hours. Basic Metabolic Panel: Recent Labs  Lab 08/26/24 1359 08/27/24 0418 08/28/24 0448  NA 132* 135 137  K 4.5 3.8 3.8  CL 101 101 103  CO2 16* 21* 23  GLUCOSE 126* 117* 115*  BUN 23 16 13   CREATININE 1.53* 1.07 1.05  CALCIUM  9.6 8.6* 8.7*   Liver Function Tests: Recent Labs  Lab 08/26/24 1359  AST 42*  ALT 22  ALKPHOS 60  BILITOT 2.2*  PROT 7.3  ALBUMIN 3.8   No results for input(s): LIPASE, AMYLASE in the last 168 hours. No results for input(s): AMMONIA in the last 168 hours. CBC: Recent Labs  Lab 08/26/24 1359 08/27/24 0418 08/28/24 0448  WBC 21.2* 15.7* 10.9*  HGB 16.0 13.8 13.8  HCT 46.4 40.4 41.3  MCV 91.9 91.8 91.6  PLT 142* 136* 145*   Cardiac Enzymes: No results for input(s): CKTOTAL, CKMB, CKMBINDEX, TROPONINI in the last 168 hours. BNP: Invalid input(s): POCBNP CBG: Recent Labs  Lab 08/27/24 0723  GLUCAP 123*   D-Dimer No results for input(s): DDIMER in the last 72 hours. Hgb A1c No results for input(s): HGBA1C in the last 72 hours. Lipid Profile No results for input(s): CHOL, HDL,  LDLCALC, TRIG, CHOLHDL, LDLDIRECT in the last 72 hours. Thyroid  function studies No results for input(s): TSH, T4TOTAL, T3FREE, THYROIDAB in the last 72 hours.  Invalid input(s): FREET3 Anemia work up No results for input(s): VITAMINB12, FOLATE, FERRITIN, TIBC, IRON, RETICCTPCT in the last 72 hours. Urinalysis    Component Value Date/Time   COLORURINE YELLOW 08/26/2024 1358   APPEARANCEUR HAZY (A) 08/26/2024 1358   LABSPEC 1.020 08/26/2024 1358   PHURINE 6.0 08/26/2024 1358   GLUCOSEU NEGATIVE 08/26/2024 1358   GLUCOSEU NEGATIVE 10/03/2023 1012   HGBUR SMALL (A) 08/26/2024 1358   BILIRUBINUR NEGATIVE 08/26/2024 1358   KETONESUR NEGATIVE 08/26/2024 1358   PROTEINUR TRACE (A) 08/26/2024 1358   UROBILINOGEN 0.2 10/03/2023 1012   NITRITE NEGATIVE 08/26/2024 1358   LEUKOCYTESUR LARGE (A) 08/26/2024 1358   Sepsis Labs Recent Labs  Lab 08/26/24 1359 08/27/24 0418 08/28/24 0448  WBC 21.2* 15.7* 10.9*   Microbiology Recent Results (from the past 240 hours)  Resp panel by RT-PCR (RSV, Flu A&B, Covid) Anterior Nasal Swab     Status: None   Collection Time: 08/26/24  2:10 PM   Specimen: Anterior Nasal Swab  Result Value Ref Range Status   SARS Coronavirus 2 by RT PCR NEGATIVE NEGATIVE Final    Comment: (NOTE) SARS-CoV-2 target nucleic acids are NOT DETECTED.  The SARS-CoV-2 RNA is generally detectable in upper respiratory specimens during the acute phase of infection. The lowest concentration of SARS-CoV-2 viral copies this assay can detect is 138 copies/mL. A negative result does not preclude SARS-Cov-2 infection and should not be used as the sole basis for treatment or other patient management decisions. A negative result may occur with  improper specimen collection/handling, submission of specimen other than nasopharyngeal swab, presence of viral mutation(s) within the areas targeted by this assay, and inadequate number of viral copies(<138  copies/mL). A negative result must be combined with clinical observations, patient history, and epidemiological information. The expected result is Negative.  Fact Sheet for Patients:  BloggerCourse.com  Fact Sheet for Healthcare Providers:  SeriousBroker.it  This test is no t yet approved or cleared by the [place]  FDA and  has been authorized for detection and/or diagnosis of SARS-CoV-2 by FDA under an Emergency Use Authorization (EUA). This EUA will remain  in effect (meaning this test can be used) for the duration of the COVID-19 declaration under Section 564(b)(1) of the Act, 21 U.S.C.section 360bbb-3(b)(1), unless the authorization is terminated  or revoked sooner.       Influenza A by PCR NEGATIVE NEGATIVE Final   Influenza B by PCR NEGATIVE NEGATIVE Final    Comment: (NOTE) The Xpert Xpress SARS-CoV-2/FLU/RSV plus assay is intended as  an aid in the diagnosis of influenza from Nasopharyngeal swab specimens and should not be used as a sole basis for treatment. Nasal washings and aspirates are unacceptable for Xpert Xpress SARS-CoV-2/FLU/RSV testing.  Fact Sheet for Patients: BloggerCourse.com  Fact Sheet for Healthcare Providers: SeriousBroker.it  This test is not yet approved or cleared by the [place]  FDA and has been authorized for detection and/or diagnosis of SARS-CoV-2 by FDA under an Emergency Use Authorization (EUA). This EUA will remain in effect (meaning this test can be used) for the duration of the COVID-19 declaration under Section 564(b)(1) of the Act, 21 U.S.C. section 360bbb-3(b)(1), unless the authorization is terminated or revoked.     Resp Syncytial Virus by PCR NEGATIVE NEGATIVE Final    Comment: (NOTE) Fact Sheet for Patients: BloggerCourse.com  Fact Sheet for Healthcare  Providers: SeriousBroker.it  This test is not yet approved or cleared by the [place]  FDA and has been authorized for detection and/or diagnosis of SARS-CoV-2 by FDA under an Emergency Use Authorization (EUA). This EUA will remain in effect (meaning this test can be used) for the duration of the COVID-19 declaration under Section 564(b)(1) of the Act, 21 U.S.C. section 360bbb-3(b)(1), unless the authorization is terminated or revoked.  Performed at [facility], [address], [place], [place] [address]   Blood culture (routine x 2)     Status: None (Preliminary result)   Collection Time: 08/26/24  5:30 PM   Specimen: BLOOD LEFT FOREARM  Result Value Ref Range Status   Specimen Description   Final    BLOOD LEFT FOREARM Performed at [facility], [address] [place], [place] [address]    Special Requests   Final    BOTTLES DRAWN AEROBIC AND ANAEROBIC Blood Culture adequate volume Performed at [facility], [address], [place], [place] [address]    Culture   Final    NO GROWTH 3 DAYS Performed at [facility], [address], [place], [place] [address]    Report Status PENDING  Incomplete  Urine Culture     Status: Abnormal   Collection Time: 08/26/24  7:10 PM   Specimen: Urine, Clean Catch  Result Value Ref Range Status   Specimen Description   Final    URINE, CLEAN CATCH Performed at [facility], [address], [place], [place] [address]    Special Requests   Final    NONE Performed at [facility], [address], [place], [place] [address]    Culture >=100,000 COLONIES/mL PROTEUS MIRABILIS (A)  Final   Report Status 08/28/2024 FINAL  Final   Organism ID, Bacteria PROTEUS MIRABILIS (A)  Final      Susceptibility   Proteus mirabilis - MIC*    AMPICILLIN <=2 SENSITIVE Sensitive     CEFAZOLIN  (URINE) Value in next row  Sensitive      4 SENSITIVEThis is a modified FDA-approved test that has been validated and its performance characteristics determined by the reporting laboratory.  This laboratory is certified under the Clinical Laboratory Improvement Amendments CLIA as qualified to perform high complexity clinical laboratory testing.    CEFEPIME Value in next row Sensitive      4 SENSITIVEThis is a modified FDA-approved test that has been validated and its performance characteristics determined by the reporting laboratory.  This laboratory is certified under the Clinical Laboratory Improvement Amendments CLIA as qualified to perform high complexity clinical laboratory testing.    ERTAPENEM Value in next row Sensitive  4 SENSITIVEThis is a modified FDA-approved test that has been validated and its performance characteristics determined by the reporting laboratory.  This laboratory is certified under the Clinical Laboratory Improvement Amendments CLIA as qualified to perform high complexity clinical laboratory testing.    CEFTRIAXONE  Value in next row Sensitive      4 SENSITIVEThis is a modified FDA-approved test that has been validated and its performance characteristics determined by the reporting laboratory.  This laboratory is certified under the Clinical Laboratory Improvement Amendments CLIA as qualified to perform high complexity clinical laboratory testing.    CIPROFLOXACIN  Value in next row Sensitive      4 SENSITIVEThis is a modified FDA-approved test that has been validated and its performance characteristics determined by the reporting laboratory.  This laboratory is certified under the Clinical Laboratory Improvement Amendments CLIA as qualified to perform high complexity clinical laboratory testing.    GENTAMICIN Value in next row Sensitive      4 SENSITIVEThis is a modified FDA-approved test that has been validated and its performance characteristics determined by the reporting laboratory.  This  laboratory is certified under the Clinical Laboratory Improvement Amendments CLIA as qualified to perform high complexity clinical laboratory testing.    NITROFURANTOIN Value in next row Resistant      4 SENSITIVEThis is a modified FDA-approved test that has been validated and its performance characteristics determined by the reporting laboratory.  This laboratory is certified under the Clinical Laboratory Improvement Amendments CLIA as qualified to perform high complexity clinical laboratory testing.    TRIMETH/SULFA Value in next row Sensitive      4 SENSITIVEThis is a modified FDA-approved test that has been validated and its performance characteristics determined by the reporting laboratory.  This laboratory is certified under the Clinical Laboratory Improvement Amendments CLIA as qualified to perform high complexity clinical laboratory testing.    AMPICILLIN/SULBACTAM Value in next row Sensitive      4 SENSITIVEThis is a modified FDA-approved test that has been validated and its performance characteristics determined by the reporting laboratory.  This laboratory is certified under the Clinical Laboratory Improvement Amendments CLIA as qualified to perform high complexity clinical laboratory testing.    PIP/TAZO Value in next row Sensitive ug/mL     <=4 SENSITIVEThis is a modified FDA-approved test that has been validated and its performance characteristics determined by the reporting laboratory.  This laboratory is certified under the Clinical Laboratory Improvement Amendments CLIA as qualified to perform high complexity clinical laboratory testing.    MEROPENEM Value in next row Sensitive      <=4 SENSITIVEThis is a modified FDA-approved test that has been validated and its performance characteristics determined by the reporting laboratory.  This laboratory is certified under the Clinical Laboratory Improvement Amendments CLIA as qualified to perform high complexity clinical laboratory testing.    *  >=100,000 COLONIES/mL PROTEUS MIRABILIS    Please note: You were cared for by a hospitalist during your hospital stay. Once you are discharged, your primary care physician will handle any further medical issues. Please note that NO REFILLS for any discharge medications will be authorized once you are discharged, as it is imperative that you return to your primary care physician (or establish a relationship with a primary care physician if you do not have one) for your post hospital discharge needs so that they can reassess your need for medications and monitor your lab values.    Time coordinating discharge: 40 minutes  SIGNED:   Ivonne Mustache, MD  [facility] 08/29/2024, 11:05 AM Pager [phone]  If 7PM-7AM, please contact night-coverage [email or web address] Password [email or web address]

## 2024-08-29 NOTE — Progress Notes (Signed)
 NURSING DISCHARGE NOTE  Discharge instructions were reviewed by this RN with the patient and his spouse. Both the patient and his spouse demonstrated verbal understanding of the discharge instructions and teaching that was provided . Patient telemetry box was removed from the patient and returned to the [employer]. Patient IV was removed and a gauze and tape dressing was applied to the site.   At this time of discharge, the patient is alert and oriented to person, place, time and situation, and upon assessment appears to be free of distress/complications or any complaint of distress or complications. Patient is ambulatory with stand by assistance.   Patient to be transported via wheelchair to the main entrance of the hospital, where he will further be transported home by his spouse via private vehicle.   Leonor FORBES Clarks, BSN, RN  08/29/24 12:26 PM

## 2024-08-29 NOTE — TOC Transition Note (Signed)
 Transition of Care [facility]) - Discharge Note   Patient Details  Name: Larry [name] MRN: [id number] Date of Birth: [birth year]  [facility] CM/SW Contact:  Bascom Service, RN Phone Number: 08/29/2024, 11:25 AM   Clinical Narrative: d/c plan home. Spouse will f/u on own for otpt PT that he had prior. Has own transport home. No further CM needs.      Final next level of care: Home/Self Care Barriers to Discharge: No Barriers Identified   Patient Goals and CMS Choice Patient states their goals for this hospitalization and ongoing recovery are:: Home CMS Medicare.gov Compare Post Acute Care list provided to:: Patient Represenative (must comment) (spouse) Choice offered to / list presented to : Spouse Rule ownership interest in [facility].provided to:: Spouse    Discharge Placement                       Discharge Plan and Services Additional resources added to the After Visit Summary for                                       Social Drivers of Health (SDOH) Interventions SDOH Screenings   Food Insecurity: No Food Insecurity (08/27/2024)  Housing: Low Risk  (08/27/2024)  Transportation Needs: No Transportation Needs (08/27/2024)  Utilities: Not At Risk (08/27/2024)  Alcohol Screen: Low Risk  (02/23/2024)  Depression (PHQ2-9): Low Risk  (02/23/2024)  Financial Resource Strain: Low Risk  (02/23/2024)  Physical Activity: Sufficiently Active (02/23/2024)  Social Connections: Socially Integrated (08/27/2024)  Stress: No Stress Concern Present (02/23/2024)  Tobacco Use: Low Risk  (08/27/2024)  Health Literacy: Adequate Health Literacy (02/23/2024)     Readmission Risk Interventions     No data to display

## 2024-08-30 ENCOUNTER — Telehealth: Payer: Self-pay

## 2024-08-30 NOTE — Patient Instructions (Signed)
 Visit Information  Thank you for taking time to visit with me today. Please don't hesitate to contact me if I can be of assistance to you.  Reviewed Red Flag symptoms (Fever, increased pain/swelling/redness at IV site, if applicable, confusion, chills, or increased pain/discomfort.) Patient instructed to call provider or go to ER if symptoms are persistent.   Patient verbalizes understanding of instructions and care plan provided today and agrees to view in MyChart. Active MyChart status and patient understanding of how to access instructions and care plan via MyChart confirmed with patient.     The patient has been provided with contact information for the care management team and has been advised to call with any health related questions or concerns.    Please call the Suicide and Crisis Lifeline: 988 if you are experiencing a Mental Health or Behavioral Health Crisis or need someone to talk to.  Caylei Sperry J. Deosha Werden RN, MSN [facility], [facility] RN Care Manager Direct Dial: [phone]  Fax: [phone] Website: [email or web address]

## 2024-08-30 NOTE — Transitions of Care (Post Inpatient/ED Visit) (Signed)
 08/30/2024  Name: Larry [name] MRN: [id number] DOB: January 09, 1932  Today's TOC FU Call Status: Today's TOC FU Call Status:: Successful TOC FU Call Completed TOC FU Call Complete Date: 08/30/24 Patient's Name and Date of Birth confirmed.  Transition Care Management Follow-up Telephone Call Date of Discharge: 08/29/24 Discharge Facility: [facility]) Type of Discharge: Inpatient Admission Primary Inpatient Discharge Diagnosis:: Sepsis secondary to UTI How have you been since you were released from the hospital?: Better Any questions or concerns?: Yes Patient Questions/Concerns:: My chart questions. gave [phone]-chart for questions Patient Questions/Concerns Addressed: Other:  Items Reviewed: Did you receive and understand the discharge instructions provided?: Yes Medications obtained,verified, and reconciled?: Yes (Medications Reviewed) Any new allergies since your discharge?: No Dietary orders reviewed?: Yes Type of Diet Ordered:: Heart Healthy Do you have support at home?: Yes People in Home [RPT]: spouse Name of Support/Comfort Primary Source: [name]  Medications Reviewed Today: Medications Reviewed Today     Reviewed by Ibn Stief, RN (Case Manager) on 08/30/24 at 1133  Med List Status: <None>   Medication Order Taking? Sig Documenting Provider Last Dose Status Informant  acetaminophen  (TYLENOL ) 500 MG tablet [id number] Yes Take 500-1,000 mg by mouth every 8 (eight) hours as needed for mild pain (pain score 1-3) (or headaches). [provider]  Active Self  calcium -vitamin D  (OSCAL WITH D) 500-200 MG-UNIT per tablet [id number] Yes Take 1 tablet by mouth 2 (two) times daily. [provider]  Active Self  cefadroxil  (DURICEF) 500 MG capsule [id number] Yes Take 2 capsules (1,000 mg total) by mouth 2 (two) times daily for 4 days. [name], MD  Active   clopidogrel  (PLAVIX ) 75 MG tablet [id number] Yes Take 1 tablet (75 mg total) by mouth daily. [name], PA-C  Active Self  fish oil-omega-3 fatty acids 1000 MG capsule [id number] Yes Take 1 g by mouth 2 (two) times daily. [provider]  Active Self           Med Note [name] Pablo May 29, 2018 10:19 AM)    folic acid  (FOLVITE ) 400 MCG tablet [id number] Yes Take 400 mcg by mouth daily. [provider]  Active Self  Multiple Vitamins-Minerals (CENTRUM SILVER ULTRA MENS PO) [id number] Yes Take 1 tablet by mouth daily with breakfast. [provider]  Active Self  nystatin  (MYCOSTATIN /NYSTOP ) powder [id number]  Use as directed twice per day as needed to affected area  Patient taking differently: Apply 1 Application topically See admin instructions. Apply as directed twice a day as needed to affected area   [name], MD  Active Self  rosuvastatin  (CRESTOR ) 5 MG tablet [id number]  Take 1 tablet daily alternating with 2 tablets every other day  Patient taking differently: Take 5-10 mg by mouth See admin instructions. Take 5 mg by mouth at bedtime, ALTERNATING WITH 10 mg every other night   [name], [name], PA-C  Active Self  tamsulosin  (FLOMAX ) 0.4 MG CAPS capsule [id number]  TAKE 1 CAPSULE DAILY [name], MD  Active Self            Home Care and Equipment/Supplies: Were Home Health Services Ordered?: NA Any new equipment or medical supplies ordered?: NA  Functional Questionnaire: Do you need assistance with bathing/showering or dressing?: No Do you need assistance with meal preparation?: No Do you need assistance with eating?: No Do you have difficulty maintaining continence: No Do you need assistance with getting out of bed/getting out of a chair/moving?:  No Do you have difficulty managing or taking your medications?: No  Follow up appointments reviewed: PCP Follow-up appointment confirmed?: No (spouse to call) MD Provider Line [phone] Given: No Specialist Hospital Follow-up appointment confirmed?: No (spouse to call urologist for  follow up due to BPH history) Reason Specialist Follow-Up Not Confirmed: Patient has Specialist Provider Number and will Call for Appointment Do you need transportation to your follow-up appointment?: No Do you understand care options if your condition(s) worsen?: Yes-patient verbalized understanding  SDOH Interventions Today    Flowsheet Row Most Recent Value  SDOH Interventions   Food Insecurity Interventions Intervention Not Indicated  Housing Interventions Intervention Not Indicated  Transportation Interventions Intervention Not Indicated  Utilities Interventions Intervention Not Indicated    [name] RN, MSN [facility]  [facility] RN Care Manager Direct Dial: [phone]  Fax: [phone] Website: [email or web address]

## 2024-08-31 LAB — CULTURE, BLOOD (ROUTINE X 2)
Culture: NO GROWTH
Special Requests: ADEQUATE

## 2024-09-04 ENCOUNTER — Other Ambulatory Visit (HOSPITAL_COMMUNITY): Payer: Self-pay

## 2024-10-08 ENCOUNTER — Ambulatory Visit: Payer: Self-pay | Admitting: Internal Medicine

## 2024-10-08 ENCOUNTER — Encounter: Payer: Self-pay | Admitting: Internal Medicine

## 2024-10-08 ENCOUNTER — Ambulatory Visit (INDEPENDENT_AMBULATORY_CARE_PROVIDER_SITE_OTHER): Payer: Medicare Other | Admitting: Internal Medicine

## 2024-10-08 VITALS — BP 122/70 | HR 95 | Temp 98.1°F | Ht 67.0 in | Wt 171.0 lb

## 2024-10-08 DIAGNOSIS — E7800 Pure hypercholesterolemia, unspecified: Secondary | ICD-10-CM | POA: Diagnosis not present

## 2024-10-08 DIAGNOSIS — E538 Deficiency of other specified B group vitamins: Secondary | ICD-10-CM

## 2024-10-08 DIAGNOSIS — R739 Hyperglycemia, unspecified: Secondary | ICD-10-CM | POA: Diagnosis not present

## 2024-10-08 DIAGNOSIS — I10 Essential (primary) hypertension: Secondary | ICD-10-CM

## 2024-10-08 DIAGNOSIS — E559 Vitamin D deficiency, unspecified: Secondary | ICD-10-CM

## 2024-10-08 DIAGNOSIS — R531 Weakness: Secondary | ICD-10-CM | POA: Diagnosis not present

## 2024-10-08 DIAGNOSIS — Z23 Encounter for immunization: Secondary | ICD-10-CM

## 2024-10-08 LAB — HEPATIC FUNCTION PANEL
ALT: 15 U/L (ref 0–53)
AST: 17 U/L (ref 0–37)
Albumin: 4.4 g/dL (ref 3.5–5.2)
Alkaline Phosphatase: 43 U/L (ref 39–117)
Bilirubin, Direct: 0.2 mg/dL (ref 0.0–0.3)
Total Bilirubin: 0.8 mg/dL (ref 0.2–1.2)
Total Protein: 7.2 g/dL (ref 6.0–8.3)

## 2024-10-08 LAB — VITAMIN D 25 HYDROXY (VIT D DEFICIENCY, FRACTURES): VITD: 40.14 ng/mL (ref 30.00–100.00)

## 2024-10-08 LAB — CBC WITH DIFFERENTIAL/PLATELET
Basophils Absolute: 0 K/uL (ref 0.0–0.1)
Basophils Relative: 0.5 % (ref 0.0–3.0)
Eosinophils Absolute: 0.2 K/uL (ref 0.0–0.7)
Eosinophils Relative: 3.2 % (ref 0.0–5.0)
HCT: 47 % (ref 39.0–52.0)
Hemoglobin: 15.9 g/dL (ref 13.0–17.0)
Lymphocytes Relative: 25.8 % (ref 12.0–46.0)
Lymphs Abs: 1.9 K/uL (ref 0.7–4.0)
MCHC: 33.8 g/dL (ref 30.0–36.0)
MCV: 90.7 fl (ref 78.0–100.0)
Monocytes Absolute: 0.8 K/uL (ref 0.1–1.0)
Monocytes Relative: 10.4 % (ref 3.0–12.0)
Neutro Abs: 4.4 K/uL (ref 1.4–7.7)
Neutrophils Relative %: 60.1 % (ref 43.0–77.0)
Platelets: 229 K/uL (ref 150.0–400.0)
RBC: 5.18 Mil/uL (ref 4.22–5.81)
RDW: 14.6 % (ref 11.5–15.5)
WBC: 7.4 K/uL (ref 4.0–10.5)

## 2024-10-08 LAB — URINALYSIS, ROUTINE W REFLEX MICROSCOPIC
Bilirubin Urine: NEGATIVE
Hgb urine dipstick: NEGATIVE
Ketones, ur: NEGATIVE
Leukocytes,Ua: NEGATIVE
Nitrite: NEGATIVE
Specific Gravity, Urine: 1.02 (ref 1.000–1.030)
Total Protein, Urine: NEGATIVE
Urine Glucose: NEGATIVE
Urobilinogen, UA: 0.2 (ref 0.0–1.0)
pH: 6.5 (ref 5.0–8.0)

## 2024-10-08 LAB — BASIC METABOLIC PANEL WITH GFR
BUN: 18 mg/dL (ref 6–23)
CO2: 28 meq/L (ref 19–32)
Calcium: 9.4 mg/dL (ref 8.4–10.5)
Chloride: 104 meq/L (ref 96–112)
Creatinine, Ser: 1.08 mg/dL (ref 0.40–1.50)
GFR: 60.68 mL/min (ref >60.00)
Glucose, Bld: 107 mg/dL — ABNORMAL HIGH (ref 70–99)
Potassium: 4.3 meq/L (ref 3.5–5.1)
Sodium: 141 meq/L (ref 135–145)

## 2024-10-08 LAB — MICROALBUMIN / CREATININE URINE RATIO
Creatinine,U: 153.9 mg/dL
Microalb Creat Ratio: 9.3 mg/g (ref 0.0–30.0)
Microalb, Ur: 1.4 mg/dL (ref 0.0–1.9)

## 2024-10-08 LAB — VITAMIN B12: Vitamin B-12: 587 pg/mL (ref 211–911)

## 2024-10-08 LAB — LIPID PANEL
Cholesterol: 122 mg/dL (ref 0–200)
HDL: 35.2 mg/dL — ABNORMAL LOW (ref >39.00)
LDL Cholesterol: 51 mg/dL (ref 0–99)
NonHDL: 87.17
Total CHOL/HDL Ratio: 3
Triglycerides: 181 mg/dL — ABNORMAL HIGH (ref 0.0–149.0)
VLDL: 36.2 mg/dL (ref 0.0–40.0)

## 2024-10-08 LAB — HEMOGLOBIN A1C: Hgb A1c MFr Bld: 5.7 % (ref 4.6–6.5)

## 2024-10-08 LAB — TSH: TSH: 1.81 u[IU]/mL (ref 0.35–5.50)

## 2024-10-08 NOTE — Patient Instructions (Signed)
 You had the flu shot today  Please continue all other medications as before, and refills have been done if requested.  Please have the pharmacy call with any other refills you may need.  Please continue your efforts at being more active, low cholesterol diet, and weight control.  You are otherwise up to date with prevention measures today.  Please keep your appointments with your specialists as you may have planned  You will be contacted regarding the referral for: Physical Therapy  Please go to the LAB at the blood drawing area for the tests to be done  You will be contacted by phone if any changes need to be made immediately.  Otherwise, you will receive a letter about your results with an explanation, but please check with MyChart first.  Please make an Appointment to return in 6 months, or sooner if needed

## 2024-10-08 NOTE — Progress Notes (Unsigned)
 Patient ID: Larry [name], male   DOB: [birth year], 89 y.o.   MRN: [id number]         Chief Complaint:: yearly exam       HPI:  Larry [name] is a 89 y.o. male here overall doing ok.  Pt denies chest pain, increased sob or doe, wheezing, orthopnea, PND, increased LE swelling, palpitations, dizziness or syncope.   Pt denies polydipsia, polyuria, or new focal neuro s/s.    Pt denies fever, wt loss, night sweats, loss of appetite, or other constitutional symptoms Did have recent UTI sepsis - Denies urinary symptoms such as dysuria, frequency, urgency, flank pain, hematuria or n/v, fever, chills.  Taking flomax  bid now.  Incontinence with uti resolved.  Due for flu shot.  Has also generalized weakness, walking with walker, still not bounced back from recent uti sepsis.   Wt Readings from Last 3 Encounters:  10/08/24 171 lb (77.6 kg)  08/27/24 177 lb 4 oz (80.4 kg)  05/03/24 172 lb (78 kg)   BP Readings from Last 3 Encounters:  10/08/24 122/70  08/29/24 124/81  05/03/24 120/72   Immunization History  Administered Date(s) Administered   Fluad Quad(high Dose 65+) 09/07/2019, 09/30/2020, 10/02/2021, 09/09/2022   Fluad Trivalent(High Dose 65+) 10/07/2023   INFLUENZA, HIGH DOSE SEASONAL PF 08/12/2017, 09/01/2018, 10/08/2024   Influenza Split 10/01/2011, 09/06/2012   Influenza Whole 09/20/2007, 10/04/2008, 10/13/2009, 09/07/2010   Influenza,inj,Quad PF,6+ Mos 09/26/2013, 09/19/2014   Influenza-Unspecified 12/02/1999, 10/15/2015   PFIZER(Purple Top)SARS-COV-2 Vaccination 01/25/2020, 02/15/2020, 09/27/2020, 03/23/2021, 09/03/2021   Pfizer Covid-19 Vaccine Bivalent Booster 64yrs & up 12/03/2022   Pfizer(Comirnaty)Fall Seasonal Vaccine 12 years and older 10/04/2024   Pneumococcal Conjugate-13 03/15/2014   Pneumococcal Polysaccharide-23 01/20/2007, 01/28/2021   Td 01/20/2002   Tetanus 03/12/2013   Zoster Recombinant(Shingrix) 01/30/2019, 07/03/2019   Health Maintenance Due  Topic Date Due    DTaP/Tdap/Td (3 - Tdap) 03/13/2023      Past Medical History:  Diagnosis Date   Allergic rhinitis, cause unspecified 03/12/2013   Allergy    CAD (coronary artery disease)    S/P CABG in 1999    Cataract    Decreased exercise tolerance    Exertional fatigue   Degenerative arthritis of hip    Diverticulosis of colon    ED (erectile dysfunction)    Erectile dysfunction 06/03/2014   History of colonic polyps    History of kidney stones    History of MI (myocardial infarction)    Hx of adenomatous colonic polyps 2000   Hyperlipidemia    Low HDL   Lumbar disc disease    Hx of with recent back pain and buttock pain   Myocardial infarction (HCC) 1999   Right knee meniscal tear    S/P removal of thyroid  nodule    Spinal stenosis of lumbar region 03/15/2014   Past Surgical History:  Procedure Laterality Date   BACK SURGERY  1960   CARDIAC CATHETERIZATION     CATARACT EXTRACTION     COLONOSCOPY     CORONARY ARTERY BYPASS GRAFT  1999   CYSTOSCOPY/URETEROSCOPY/HOLMIUM LASER/STENT PLACEMENT Left 12/23/2017   Procedure: CYSTOSCOPY/RETROGRADE/URETEROSCOPY/HOLMIUM LASER/STENT PLACEMENT;  Surgeon: Nieves Cough, MD;  Location: [facility];  Service: Urology;  Laterality: Left;  ONLY NEEDS 60 MIN   EYE SURGERY Bilateral    bilaterl cataract removal   LUMBAR DISC SURGERY     LUMBAR LAMINECTOMY/DECOMPRESSION MICRODISCECTOMY Bilateral 07/06/2021   Procedure: Bilateral Lumbar three-four Laminectomy and foraminotomy;  Surgeon: Colon Shove, MD;  Location: [facility] OR;  Service: Neurosurgery;  Laterality: Bilateral;   Nodules on Thyroid      POLYPECTOMY     PTCA     SHOULDER ARTHROSCOPY WITH ROTATOR CUFF REPAIR AND SUBACROMIAL DECOMPRESSION Right 01/30/2015   Procedure: RIGHT ARTHROSCOPY SHOULDER SUBACROMIAL DECOMPRESSION DISTAL CLAVICAL RESECTION RETATOR CUFF REPAIR;  Surgeon: Franky Pointer, MD;  Location: [facility] OR;  Service: Orthopedics;  Laterality: Right;   TONSILLECTOMY     VASECTOMY      reports  that he has never smoked. He has never been exposed to tobacco smoke. He has never used smokeless tobacco. He reports that he does not currently use alcohol. He reports that he does not use drugs. family history includes Heart disease in his brother and mother; Prostate cancer in some other family members. Allergies  Allergen Reactions   Beta Adrenergic Blockers Other (See Comments)    Dropped blood pressure too low   Simvastatin Other (See Comments)    Muscle pain   Current Outpatient Medications on File Prior to Visit  Medication Sig Dispense Refill   acetaminophen  (TYLENOL ) 500 MG tablet Take 500-1,000 mg by mouth every 8 (eight) hours as needed for mild pain (pain score 1-3) (or headaches).     calcium -vitamin D  (OSCAL WITH D) 500-200 MG-UNIT per tablet Take 1 tablet by mouth 2 (two) times daily.     clopidogrel  (PLAVIX ) 75 MG tablet Take 1 tablet (75 mg total) by mouth daily. 90 tablet 3   fish oil-omega-3 fatty acids 1000 MG capsule Take 1 g by mouth 2 (two) times daily.     folic acid  (FOLVITE ) 400 MCG tablet Take 400 mcg by mouth daily.     Multiple Vitamins-Minerals (CENTRUM SILVER ULTRA MENS PO) Take 1 tablet by mouth daily with breakfast.     nystatin  (MYCOSTATIN /NYSTOP ) powder Use as directed twice per day as needed to affected area (Patient taking differently: Apply 1 Application topically See admin instructions. Apply as directed twice a day as needed to affected area) 60 g 0   rosuvastatin  (CRESTOR ) 5 MG tablet Take 1 tablet daily alternating with 2 tablets every other day (Patient taking differently: Take 5-10 mg by mouth See admin instructions. Take 5 mg by mouth at bedtime, ALTERNATING WITH 10 mg every other night) 135 tablet 3   tamsulosin  (FLOMAX ) 0.4 MG CAPS capsule TAKE 1 CAPSULE DAILY (Patient taking differently: Take 0.4 mg by mouth 2 (two) times daily.) 90 capsule 3   No current facility-administered medications on file prior to visit.        ROS:  All others reviewed  and negative.  Objective        PE:  BP 122/70 (BP Location: Left Arm, Patient Position: Sitting, Cuff Size: Normal)   Pulse 95   Temp 98.1 F (36.7 C) (Oral)   Ht 5' 7 (1.702 m)   Wt 171 lb (77.6 kg)   SpO2 96%   BMI 26.78 kg/m                 Constitutional: Pt appears in NAD               HENT: Head: NCAT.                Right Ear: External ear normal.                 Left Ear: External ear normal.                Eyes: . Pupils are equal, round, and  reactive to light. Conjunctivae and EOM are normal               Nose: without d/c or deformity               Neck: Neck supple. Gross normal ROM               Cardiovascular: Normal rate and regular rhythm.                 Pulmonary/Chest: Effort normal and breath sounds without rales or wheezing.                Abd:  Soft, NT, ND, + BS, no organomegaly               Neurological: Pt is alert. At baseline orientation, motor grossly intact               Skin: Skin is warm. No rashes, no other new lesions, LE edema - none               Psychiatric: Pt behavior is normal without agitation   Micro: none  Cardiac tracings I have personally interpreted today:  none  Pertinent Radiological findings (summarize): none   Lab Results  Component Value Date   WBC 7.4 10/08/2024   HGB 15.9 10/08/2024   HCT 47.0 10/08/2024   PLT 229.0 10/08/2024   GLUCOSE 107 (H) 10/08/2024   CHOL 122 10/08/2024   TRIG 181.0 (H) 10/08/2024   HDL 35.20 (L) 10/08/2024   LDLDIRECT 57.0 10/02/2021   LDLCALC 51 10/08/2024   ALT 15 10/08/2024   AST 17 10/08/2024   NA 141 10/08/2024   K 4.3 10/08/2024   CL 104 10/08/2024   CREATININE 1.08 10/08/2024   BUN 18 10/08/2024   CO2 28 10/08/2024   TSH 1.81 10/08/2024   PSA 1.40 03/20/2015   INR 0.99 12/19/2017   HGBA1C 5.7 10/08/2024   MICROALBUR 1.4 10/08/2024   Assessment/Plan:  Larry [name] is a 89 y.o. White or Caucasian [1] male with  has a past medical history of Allergic rhinitis, cause  unspecified (03/12/2013), Allergy, CAD (coronary artery disease), Cataract, Decreased exercise tolerance, Degenerative arthritis of hip, Diverticulosis of colon, ED (erectile dysfunction), Erectile dysfunction (06/03/2014), History of colonic polyps, History of kidney stones, History of MI (myocardial infarction), adenomatous colonic polyps (2000), Hyperlipidemia, Lumbar disc disease, Myocardial infarction (HCC) (1999), Right knee meniscal tear, S/P removal of thyroid  nodule, and Spinal stenosis of lumbar region (03/15/2014).  HYPERTENSION, BENIGN BP Readings from Last 3 Encounters:  10/08/24 122/70  08/29/24 124/81  05/03/24 120/72   Stable, pt to continue medical treatment   - diet, wt control  Hyperlipidemia Lab Results  Component Value Date   LDLCALC 51 10/08/2024   Stable, pt to continue current statin crestor  5 mg   Hyperglycemia Lab Results  Component Value Date   HGBA1C 5.7 10/08/2024   Stable, pt to continue current medical treatment  - diet wt control   Generalized weakness Mild to mod persistent, cont walker, pt for PT referral outpatient  Followup: Return in about 6 months (around 04/08/2025).  Lynwood Rush, MD 10/10/2024 8:29 PM [facility]

## 2024-10-08 NOTE — Progress Notes (Signed)
 The test results show that your current treatment is OK, as the tests are stable.  Please continue the same plan.  There is no other need for change of treatment or further evaluation based on these results, at this time.  thanks

## 2024-10-10 ENCOUNTER — Encounter: Payer: Self-pay | Admitting: Internal Medicine

## 2024-10-10 NOTE — Assessment & Plan Note (Signed)
 BP Readings from Last 3 Encounters:  10/08/24 122/70  08/29/24 124/81  05/03/24 120/72   Stable, pt to continue medical treatment   - diet, wt control

## 2024-10-10 NOTE — Assessment & Plan Note (Signed)
 Lab Results  Component Value Date   LDLCALC 51 10/08/2024   Stable, pt to continue current statin crestor  5 mg

## 2024-10-10 NOTE — Assessment & Plan Note (Signed)
 Lab Results  Component Value Date   HGBA1C 5.7 10/08/2024   Stable, pt to continue current medical treatment  - diet wt control

## 2024-10-10 NOTE — Assessment & Plan Note (Signed)
 Mild to mod persistent, cont walker, pt for PT referral outpatient

## 2024-11-06 ENCOUNTER — Other Ambulatory Visit: Payer: Self-pay

## 2024-11-06 ENCOUNTER — Ambulatory Visit: Attending: Internal Medicine

## 2024-11-06 DIAGNOSIS — R531 Weakness: Secondary | ICD-10-CM | POA: Diagnosis not present

## 2024-11-06 DIAGNOSIS — M6281 Muscle weakness (generalized): Secondary | ICD-10-CM | POA: Diagnosis present

## 2024-11-06 DIAGNOSIS — R2681 Unsteadiness on feet: Secondary | ICD-10-CM | POA: Insufficient documentation

## 2024-11-06 DIAGNOSIS — R262 Difficulty in walking, not elsewhere classified: Secondary | ICD-10-CM | POA: Insufficient documentation

## 2024-11-06 NOTE — Therapy (Signed)
 OUTPATIENT PHYSICAL THERAPY NEURO EVALUATION   Patient Name: Larry [name] MRN: [id number] DOB:[birth year], 89 y.o., male Today's Date: 11/06/2024   PCP: [name], MD REFERRING PROVIDER: [name], MD  END OF SESSION:  PT End of Session - 11/06/24 1107     Visit Number 1    Number of Visits 5    Date for Recertification  12/04/24    Authorization Type Medicare    PT Start Time 1105    PT Stop Time 1145    PT Time Calculation (min) 40 min          Past Medical History:  Diagnosis Date   Allergic rhinitis, cause unspecified 03/12/2013   Allergy    CAD (coronary artery disease)    S/P CABG in 1999    Cataract    Decreased exercise tolerance    Exertional fatigue   Degenerative arthritis of hip    Diverticulosis of colon    ED (erectile dysfunction)    Erectile dysfunction 06/03/2014   History of colonic polyps    History of kidney stones    History of MI (myocardial infarction)    Hx of adenomatous colonic polyps 2000   Hyperlipidemia    Low HDL   Lumbar disc disease    Hx of with recent back pain and buttock pain   Myocardial infarction (HCC) 1999   Right knee meniscal tear    S/P removal of thyroid  nodule    Spinal stenosis of lumbar region 03/15/2014   Past Surgical History:  Procedure Laterality Date   BACK SURGERY  1960   CARDIAC CATHETERIZATION     CATARACT EXTRACTION     COLONOSCOPY     CORONARY ARTERY BYPASS GRAFT  1999   CYSTOSCOPY/URETEROSCOPY/HOLMIUM LASER/STENT PLACEMENT Left 12/23/2017   Procedure: CYSTOSCOPY/RETROGRADE/URETEROSCOPY/HOLMIUM LASER/STENT PLACEMENT;  Surgeon: Nieves Cough, MD;  Location: [facility];  Service: Urology;  Laterality: Left;  ONLY NEEDS 60 MIN   EYE SURGERY Bilateral    bilaterl cataract removal   LUMBAR DISC SURGERY     LUMBAR LAMINECTOMY/DECOMPRESSION MICRODISCECTOMY Bilateral 07/06/2021   Procedure: Bilateral Lumbar three-four Laminectomy and foraminotomy;  Surgeon: Colon Shove, MD;  Location: [facility] OR;   Service: Neurosurgery;  Laterality: Bilateral;   Nodules on Thyroid      POLYPECTOMY     PTCA     SHOULDER ARTHROSCOPY WITH ROTATOR CUFF REPAIR AND SUBACROMIAL DECOMPRESSION Right 01/30/2015   Procedure: RIGHT ARTHROSCOPY SHOULDER SUBACROMIAL DECOMPRESSION DISTAL CLAVICAL RESECTION RETATOR CUFF REPAIR;  Surgeon: Franky Pointer, MD;  Location: [facility] OR;  Service: Orthopedics;  Laterality: Right;   TONSILLECTOMY     VASECTOMY     Patient Active Problem List   Diagnosis Date Noted   Sepsis secondary to UTI (HCC) 08/26/2024   Acute cystitis without hematuria 08/26/2024   AKI (acute kidney injury) 08/26/2024   Hyponatremia 08/26/2024   Generalized weakness 08/26/2024   Metabolic acidosis 08/26/2024   Right ear pain 12/27/2023   Rash 10/11/2023   Vitamin D  deficiency 10/11/2023   Squamous cell cancer of skin of forearm, left 10/07/2023   URI (upper respiratory infection) 03/04/2023   Basal cell carcinoma of right thigh 10/05/2022   Impairment of balance 10/05/2022   Right leg weakness 10/05/2022   Spinal stenosis of lumbar region with neurogenic claudication 07/07/2021   Spinal stenosis, lumbar region, with neurogenic claudication 07/06/2021   Dysfunction of Eustachian tube, bilateral 07/21/2020   Acquired scoliosis 05/31/2017   Gait disorder 04/20/2016   Hyperglycemia 04/20/2016   Cyst of  left kidney 03/18/2016   Radicular syndrome of right leg 08/12/2015   Radicular low back pain 08/06/2015   Abnormal CT scan, chest 06/03/2014   Erectile dysfunction 06/03/2014   Spinal stenosis of lumbar region 03/15/2014   Cough 03/12/2013   Allergic rhinitis 03/12/2013   Leg pain, bilateral 12/30/2011   Bladder neck obstruction 12/30/2011   Preventative health care 12/25/2011   Lumbar disc disease 12/25/2011   S/P removal of thyroid  nodule 12/25/2011   History of MI (myocardial infarction) 12/25/2011   Right knee meniscal tear 12/25/2011   Degenerative arthritis of hip 12/25/2011   HIP PAIN,  RIGHT 05/14/2010   FATIGUE 10/13/2009   HYPERTENSION, BENIGN 02/25/2009   CAD, AUTOLOGOUS BYPASS GRAFT 02/25/2009   CORONARY ARTERY ANEURYSM 02/25/2009   Backache 04/15/2008   Hyperlipidemia 02/13/2008   Coronary atherosclerosis 02/13/2008   Diverticulosis of colon 02/13/2008   History of cardiovascular disorder 02/13/2008   History of colonic polyps 02/13/2008    ONSET DATE:   REFERRING DIAG:  R53.1 (ICD-10-CM) - Generalized weakness    THERAPY DIAG:  Muscle weakness (generalized)  Difficulty in walking, not elsewhere classified  Unsteadiness on feet  Rationale for Evaluation and Treatment: Rehabilitation  SUBJECTIVE:                                                                                                                                                                                             SUBJECTIVE STATEMENT: I have balance issues. Everyday goes for walk at park for a mile using rollator and using rollator half the time in the house as well. Interested in improving balance to reduce dependency upon AD.  Pt accompanied by: self  PERTINENT HISTORY: Has also generalized weakness, walking with walker, still not bounced back from recent uti sepsis.  PAIN:  Are you having pain? No  PRECAUTIONS: None  RED FLAGS: None   WEIGHT BEARING RESTRICTIONS: No  FALLS: Has patient fallen in last 6 months? No  LIVING ENVIRONMENT: Lives with: lives with their spouse Lives in: House/apartment Stairs: everything on main floor Has following equipment at home: [employer] - 2 wheeled and [employer] - 4 wheeled  PLOF: Independent  PATIENT GOALS:   OBJECTIVE:  Note: Objective measures were completed at Evaluation unless otherwise noted.  DIAGNOSTIC FINDINGS: na  COGNITION: Overall cognitive status: Within functional limits for tasks assessed   SENSATION: Not tested  COORDINATION: WFL    MUSCLE TONE: NT    POSTURE: No Significant postural  limitations  LOWER EXTREMITY ROM:     Active  Right Eval Left Eval  Hip flexion    Hip extension  Hip abduction    Hip adduction    Hip internal rotation    Hip external rotation    Knee flexion    Knee extension    Ankle dorsiflexion    Ankle plantarflexion    Ankle inversion    Ankle eversion     (Blank rows = not tested)  LOWER EXTREMITY MMT:    BLE 5/5 seated resisted tests  BED MOBILITY:  Not tested  TRANSFERS: Independent   CURB:  Findings: AD needed  STAIRS: HR needed GAIT: Findings: Comments: wide BOS, decreased stride length  FUNCTIONAL TESTS:  5 times sit to stand: 11.53 sec FGA: TBD Berg Balance Test: 44/56 M-CTSIB  Condition 1: Firm Surface, EO 30 Sec, Mild Sway  Condition 2: Firm Surface, EC 12 Sec, Moderate and Severe Sway  Condition 3: Foam Surface, EO  Sec,  Sway  Condition 4: Foam Surface, EC  Sec,  Sway                                                                                                                                  TREATMENT DATE: 11/06/24    PATIENT EDUCATION: Education details: assessment details, HEP initiation Person educated: Patient Education method: Explanation and Handouts Education comprehension: needs further education  HOME EXERCISE PROGRAM: Access Code: [email or web address] URL: [email or web address] Date: 11/06/2024 Prepared by: Kelly Vennie Salsbury  Exercises - Corner Balance Feet Together With Eyes Open  - 1 x daily - 7 x weekly - 3 sets - 30 sec hold - Corner Balance Feet Together With Eyes Closed  - 1 x daily - 7 x weekly - 3 sets - 30 sec hold - Corner Balance Feet Together: Eyes Open With Head Turns  - 1 x daily - 7 x weekly - 3 sets - 30 sec hold - Semi-Tandem Corner Balance With Eyes Open  - 7 x weekly - 3 sets - 15-30 sec hold  GOALS: Goals reviewed with patient? Yes  SHORT TERM GOALS: Target date: LTG   LONG TERM GOALS: Target date: 12/04/2024    Patient will be independent in HEP to  improve functional outcomes Baseline:  Goal status: INITIAL  2.  Demo improved balance and reduced risk for falls per score 50/56 Berg Balance Test Baseline: 44/56 Goal status: INITIAL  3.  Demo improved safety with ambulation to reduce AD dependence per score 22/30 Functional Gait Assessment  Baseline: TBD Goal status: INITIAL  4.  Demo improved postural control per mild sway x 30 sec condition 2 M-CTSIB to improve safety with ADL Baseline: 12 sec mod-severe Goal status: INITIAL  5.  Ambulation without AD Baseline: SBA Goal status: INITIAL    ASSESSMENT:  CLINICAL IMPRESSION: Patient is a 89 y.o. male who was seen today for physical therapy evaluation and treatment for R53.1 (ICD-10-CM) - Generalized weakness. Pt chief complaint at this time is lack of balance and dependence on AD for safe ambulation.  Exhibits good BLE  strength per manual muscle test and 15 sec time for 5xSTS test indicating low risk for falls.  Berg Balance Test score 44/56 indicating increased risk for falls with chief difficulty being narrow BOS or single limb stance control.  Ambulation without AD w/ SBA due to unsteadiness and maintains wide BOS and short stride length to compensate for lack of balance and poor control to M-CTSIB demands.  Pt would benefit from PT services to address deficits and limitations and initiated HEP development on this date with static balance activities safe to perform unassisted at home.    OBJECTIVE IMPAIRMENTS: Abnormal gait, decreased activity tolerance, decreased balance, decreased knowledge of use of DME, and difficulty walking.   ACTIVITY LIMITATIONS: carrying, lifting, standing, reach over head, and locomotion level  PARTICIPATION LIMITATIONS: meal prep, cleaning, laundry, and community activity  PERSONAL FACTORS: Age, Time since onset of injury/illness/exacerbation, and 1-2 comorbidities: PMH are also affecting patient's functional outcome.   REHAB POTENTIAL:  Excellent  CLINICAL DECISION MAKING: Evolving/moderate complexity  EVALUATION COMPLEXITY: Moderate  PLAN:  PT FREQUENCY: 1x/week  PT DURATION: 4 weeks  PLANNED INTERVENTIONS: 97750- Physical Performance Testing, 97110-Therapeutic exercises, 97530- Therapeutic activity, [id number]- Neuromuscular re-education, 97535- Self Care, 02859- Manual therapy, [id number]- Gait training, [phone]- Canalith repositioning, [id number]- Aquatic Therapy, [phone] (1-2 muscles), [id number] (3+ muscles)- Dry Needling, and Patient/Family education  PLAN FOR NEXT SESSION: HEP review and progressions, FGA and goal   1:11 PM, 11/06/24 M. Kelly Tyera Hansley, PT, DPT Physical Therapist- [place] Office Number: [phone]

## 2024-11-21 ENCOUNTER — Ambulatory Visit: Attending: Internal Medicine

## 2024-11-21 DIAGNOSIS — R2681 Unsteadiness on feet: Secondary | ICD-10-CM | POA: Diagnosis present

## 2024-11-21 DIAGNOSIS — R262 Difficulty in walking, not elsewhere classified: Secondary | ICD-10-CM | POA: Diagnosis present

## 2024-11-21 DIAGNOSIS — M6281 Muscle weakness (generalized): Secondary | ICD-10-CM | POA: Insufficient documentation

## 2024-11-21 NOTE — Therapy (Signed)
 OUTPATIENT PHYSICAL THERAPY NEURO Treatment   Patient Name: Larry [name] MRN: [id number] DOB:28-May-1932, 89 y.o., male Today's Date: 11/21/2024   PCP: [name], MD REFERRING PROVIDER: [name], MD  END OF SESSION:  PT End of Session - 11/21/24 0930     Visit Number 2    Number of Visits 5    Date for Recertification  12/04/24    Authorization Type Medicare    PT Start Time 0930    PT Stop Time 1015    PT Time Calculation (min) 45 min          Past Medical History:  Diagnosis Date   Allergic rhinitis, cause unspecified 03/12/2013   Allergy    CAD (coronary artery disease)    S/P CABG in 1999    Cataract    Decreased exercise tolerance    Exertional fatigue   Degenerative arthritis of hip    Diverticulosis of colon    ED (erectile dysfunction)    Erectile dysfunction 06/03/2014   History of colonic polyps    History of kidney stones    History of MI (myocardial infarction)    Hx of adenomatous colonic polyps 2000   Hyperlipidemia    Low HDL   Lumbar disc disease    Hx of with recent back pain and buttock pain   Myocardial infarction (HCC) 1999   Right knee meniscal tear    S/P removal of thyroid  nodule    Spinal stenosis of lumbar region 03/15/2014   Past Surgical History:  Procedure Laterality Date   BACK SURGERY  1960   CARDIAC CATHETERIZATION     CATARACT EXTRACTION     COLONOSCOPY     CORONARY ARTERY BYPASS GRAFT  1999   CYSTOSCOPY/URETEROSCOPY/HOLMIUM LASER/STENT PLACEMENT Left 12/23/2017   Procedure: CYSTOSCOPY/RETROGRADE/URETEROSCOPY/HOLMIUM LASER/STENT PLACEMENT;  Surgeon: Nieves Cough, MD;  Location: [facility];  Service: Urology;  Laterality: Left;  ONLY NEEDS 60 MIN   EYE SURGERY Bilateral    bilaterl cataract removal   LUMBAR DISC SURGERY     LUMBAR LAMINECTOMY/DECOMPRESSION MICRODISCECTOMY Bilateral 07/06/2021   Procedure: Bilateral Lumbar three-four Laminectomy and foraminotomy;  Surgeon: Colon Shove, MD;  Location: [facility] OR;   Service: Neurosurgery;  Laterality: Bilateral;   Nodules on Thyroid      POLYPECTOMY     PTCA     SHOULDER ARTHROSCOPY WITH ROTATOR CUFF REPAIR AND SUBACROMIAL DECOMPRESSION Right 01/30/2015   Procedure: RIGHT ARTHROSCOPY SHOULDER SUBACROMIAL DECOMPRESSION DISTAL CLAVICAL RESECTION RETATOR CUFF REPAIR;  Surgeon: Franky Pointer, MD;  Location: [facility] OR;  Service: Orthopedics;  Laterality: Right;   TONSILLECTOMY     VASECTOMY     Patient Active Problem List   Diagnosis Date Noted   Sepsis secondary to UTI (HCC) 08/26/2024   Acute cystitis without hematuria 08/26/2024   AKI (acute kidney injury) 08/26/2024   Hyponatremia 08/26/2024   Generalized weakness 08/26/2024   Metabolic acidosis 08/26/2024   Right ear pain 12/27/2023   Rash 10/11/2023   Vitamin D  deficiency 10/11/2023   Squamous cell cancer of skin of forearm, left 10/07/2023   URI (upper respiratory infection) 03/04/2023   Basal cell carcinoma of right thigh 10/05/2022   Impairment of balance 10/05/2022   Right leg weakness 10/05/2022   Spinal stenosis of lumbar region with neurogenic claudication 07/07/2021   Spinal stenosis, lumbar region, with neurogenic claudication 07/06/2021   Dysfunction of Eustachian tube, bilateral 07/21/2020   Acquired scoliosis 05/31/2017   Gait disorder 04/20/2016   Hyperglycemia 04/20/2016   Cyst of  left kidney 03/18/2016   Radicular syndrome of right leg 08/12/2015   Radicular low back pain 08/06/2015   Abnormal CT scan, chest 06/03/2014   Erectile dysfunction 06/03/2014   Spinal stenosis of lumbar region 03/15/2014   Cough 03/12/2013   Allergic rhinitis 03/12/2013   Leg pain, bilateral 12/30/2011   Bladder neck obstruction 12/30/2011   Preventative health care 12/25/2011   Lumbar disc disease 12/25/2011   S/P removal of thyroid  nodule 12/25/2011   History of MI (myocardial infarction) 12/25/2011   Right knee meniscal tear 12/25/2011   Degenerative arthritis of hip 12/25/2011   HIP PAIN,  RIGHT 05/14/2010   FATIGUE 10/13/2009   HYPERTENSION, BENIGN 02/25/2009   CAD, AUTOLOGOUS BYPASS GRAFT 02/25/2009   CORONARY ARTERY ANEURYSM 02/25/2009   Backache 04/15/2008   Hyperlipidemia 02/13/2008   Coronary atherosclerosis 02/13/2008   Diverticulosis of colon 02/13/2008   History of cardiovascular disorder 02/13/2008   History of colonic polyps 02/13/2008    ONSET DATE:   REFERRING DIAG:  R53.1 (ICD-10-CM) - Generalized weakness    THERAPY DIAG:  Muscle weakness (generalized)  Difficulty in walking, not elsewhere classified  Unsteadiness on feet  Rationale for Evaluation and Treatment: Rehabilitation  SUBJECTIVE:                                                                                                                                                                                             SUBJECTIVE STATEMENT: Did the HEP everyday Pt accompanied by: self  PERTINENT HISTORY: Has also generalized weakness, walking with walker, still not bounced back from recent uti sepsis.  PAIN:  Are you having pain? No  PRECAUTIONS: None  RED FLAGS: None   WEIGHT BEARING RESTRICTIONS: No  FALLS: Has patient fallen in last 6 months? No  LIVING ENVIRONMENT: Lives with: lives with their spouse Lives in: House/apartment Stairs: everything on main floor Has following equipment at home: [employer] - 2 wheeled and [employer] - 4 wheeled  PLOF: Independent  PATIENT GOALS:   OBJECTIVE:   TODAY'S TREATMENT: 11/21/24 Activity Comments  HEP review Good recall  FGA  19/30  Sit-stand stride stance 2x10 Cues for foot placement and engagement  Sidestepping x1.5 min Cues for active hip abduction push-off  Lift-hover-hold 4 step 1x10         [facility] PT Assessment - 11/21/24 0001       Functional Gait  Assessment   Gait assessed  Yes    Gait Level Surface Walks 20 ft in less than 7 sec but greater than 5.5 sec, uses assistive device, slower speed, mild gait deviations, or  deviates 6-10 in outside of  the 12 in walkway width.    Change in Gait Speed Able to change speed, demonstrates mild gait deviations, deviates 6-10 in outside of the 12 in walkway width, or no gait deviations, unable to achieve a major change in velocity, or uses a change in velocity, or uses an assistive device.    Gait with Horizontal Head Turns Performs head turns smoothly with slight change in gait velocity (eg, minor disruption to smooth gait path), deviates 6-10 in outside 12 in walkway width, or uses an assistive device.    Gait with Vertical Head Turns Performs task with slight change in gait velocity (eg, minor disruption to smooth gait path), deviates 6 - 10 in outside 12 in walkway width or uses assistive device    Gait and Pivot Turn Pivot turns safely in greater than 3 sec and stops with no loss of balance, or pivot turns safely within 3 sec and stops with mild imbalance, requires small steps to catch balance.    Step Over Obstacle Is able to step over one shoe box (4.5 in total height) without changing gait speed. No evidence of imbalance.    Gait with Narrow Base of Support Ambulates 7-9 steps.    Gait with Eyes Closed Walks 20 ft, slow speed, abnormal gait pattern, evidence for imbalance, deviates 10-15 in outside 12 in walkway width. Requires more than 9 sec to ambulate 20 ft.    Ambulating Backwards Walks 20 ft, uses assistive device, slower speed, mild gait deviations, deviates 6-10 in outside 12 in walkway width.    Steps Alternating feet, must use rail.    Total Score 19          PATIENT EDUCATION: Education details: assessment details, HEP initiation Person educated: Patient Education method: Explanation and Handouts Education comprehension: needs further education  HOME EXERCISE PROGRAM: Access Code: [email or web address] URL: [email or web address] Date: 11/06/2024 Prepared by: Kelly Madeleine Fenn  Exercises - Corner Balance Feet Together With Eyes Open  - 1 x daily - 7 x  weekly - 3 sets - 30 sec hold - Corner Balance Feet Together With Eyes Closed  - 1 x daily - 7 x weekly - 3 sets - 30 sec hold - Corner Balance Feet Together: Eyes Open With Head Turns  - 1 x daily - 7 x weekly - 3 sets - 30 sec hold - Semi-Tandem Corner Balance With Eyes Open  - 7 x weekly - 3 sets - 15-30 sec hold - Sit to Stand in Stride with AFO and UE Assist in LE Alignment  - 2-3 x weekly - 3 sets - 10 reps - Standing Foot Lift on Box (BKA)  - 2-3 x weekly - 1-2 sets - 10 reps - Side Stepping with Counter Support  - 2-3 x weekly - 1-2 sets - 1-2 min round hold  Note: Objective measures were completed at Evaluation unless otherwise noted.  DIAGNOSTIC FINDINGS: na  COGNITION: Overall cognitive status: Within functional limits for tasks assessed   SENSATION: Not tested  COORDINATION: WFL    MUSCLE TONE: NT    POSTURE: No Significant postural limitations  LOWER EXTREMITY ROM:     Active  Right Eval Left Eval  Hip flexion    Hip extension    Hip abduction    Hip adduction    Hip internal rotation    Hip external rotation    Knee flexion    Knee extension    Ankle dorsiflexion    Ankle plantarflexion    Ankle inversion  Ankle eversion     (Blank rows = not tested)  LOWER EXTREMITY MMT:    BLE 5/5 seated resisted tests  BED MOBILITY:  Not tested  TRANSFERS: Independent   CURB:  Findings: AD needed  STAIRS: HR needed GAIT: Findings: Comments: wide BOS, decreased stride length  FUNCTIONAL TESTS:  5 times sit to stand: 11.53 sec FGA: TBD Berg Balance Test: 44/56 M-CTSIB  Condition 1: Firm Surface, EO 30 Sec, Mild Sway  Condition 2: Firm Surface, EC 12 Sec, Moderate and Severe Sway  Condition 3: Foam Surface, EO  Sec,  Sway  Condition 4: Foam Surface, EC  Sec,  Sway                                                                                                                                  TREATMENT DATE: 11/06/24      GOALS: Goals  reviewed with patient? Yes  SHORT TERM GOALS: Target date: LTG   LONG TERM GOALS: Target date: 12/04/2024    Patient will be independent in HEP to improve functional outcomes Baseline:  Goal status: IN PROGRESS  2.  Demo improved balance and reduced risk for falls per score 50/56 Berg Balance Test Baseline: 44/56 Goal status: INITIAL  3.  Demo improved safety with ambulation to reduce AD dependence per score 22/30 Functional Gait Assessment  Baseline: 19/30 Goal status: IN PROGRESS  4.  Demo improved postural control per mild sway x 30 sec condition 2 M-CTSIB to improve safety with ADL Baseline: 12 sec mod-severe Goal status: INITIAL  5.  Ambulation without AD Baseline: SBA Goal status: INITIAL    ASSESSMENT:  CLINICAL IMPRESSION: Returns to clinic with report of performing balance HEP daily. Good return demonstration with ongoing difficulty with balance/postural control eyes closed conditions.  Functional Gait Assessment performed with score 19/30 with scores < 22 indicative of falls in older adults.  Difficulty with multisensory conditions during testing and single limb support demands. Progressed HEP to include transfer training strategies to improve power for arising from low seat heights and progressed to more dynamic balance activities to improve single limb support for demands of ambulation w/out AD per pt goal.   OBJECTIVE IMPAIRMENTS: Abnormal gait, decreased activity tolerance, decreased balance, decreased knowledge of use of DME, and difficulty walking.   ACTIVITY LIMITATIONS: carrying, lifting, standing, reach over head, and locomotion level  PARTICIPATION LIMITATIONS: meal prep, cleaning, laundry, and community activity  PERSONAL FACTORS: Age, Time since onset of injury/illness/exacerbation, and 1-2 comorbidities: PMH are also affecting patient's functional outcome.   REHAB POTENTIAL: Excellent  CLINICAL DECISION MAKING: Evolving/moderate  complexity  EVALUATION COMPLEXITY: Moderate  PLAN:  PT FREQUENCY: 1x/week  PT DURATION: 4 weeks  PLANNED INTERVENTIONS: 97750- Physical Performance Testing, 97110-Therapeutic exercises, 97530- Therapeutic activity, [id number]- Neuromuscular re-education, 97535- Self Care, 02859- Manual therapy, [id number]- Gait training, [phone]- Canalith repositioning, [id number]- Aquatic Therapy, [phone] (1-2 muscles), [id number] (3+ muscles)- Dry Needling, and Patient/Family  education  PLAN FOR NEXT SESSION: HEP review and progressions   9:31 AM, 11/21/24 M. Kelly Cayley Pester, PT, DPT Physical Therapist- [place] Office Number: [phone]

## 2024-11-22 ENCOUNTER — Other Ambulatory Visit: Payer: Self-pay | Admitting: Physician Assistant

## 2024-11-23 ENCOUNTER — Other Ambulatory Visit: Payer: Self-pay | Admitting: Physician Assistant

## 2024-11-27 ENCOUNTER — Ambulatory Visit

## 2024-11-27 DIAGNOSIS — M6281 Muscle weakness (generalized): Secondary | ICD-10-CM | POA: Diagnosis not present

## 2024-11-27 DIAGNOSIS — R262 Difficulty in walking, not elsewhere classified: Secondary | ICD-10-CM

## 2024-11-27 DIAGNOSIS — R2681 Unsteadiness on feet: Secondary | ICD-10-CM

## 2024-11-27 NOTE — Therapy (Signed)
 OUTPATIENT PHYSICAL THERAPY NEURO Treatment   Patient Name: Larry [name] MRN: [id number] DOB:Nov 17, 1932, 89 y.o., male Today's Date: 11/27/2024   PCP: [name], MD REFERRING PROVIDER: [name], MD  END OF SESSION:  PT End of Session - 11/27/24 1148     Visit Number 3    Number of Visits 5    Date for Recertification  12/04/24    Authorization Type Medicare    PT Start Time 1145    PT Stop Time 1230    PT Time Calculation (min) 45 min          Past Medical History:  Diagnosis Date   Allergic rhinitis, cause unspecified 03/12/2013   Allergy    CAD (coronary artery disease)    S/P CABG in 1999    Cataract    Decreased exercise tolerance    Exertional fatigue   Degenerative arthritis of hip    Diverticulosis of colon    ED (erectile dysfunction)    Erectile dysfunction 06/03/2014   History of colonic polyps    History of kidney stones    History of MI (myocardial infarction)    Hx of adenomatous colonic polyps 2000   Hyperlipidemia    Low HDL   Lumbar disc disease    Hx of with recent back pain and buttock pain   Myocardial infarction (HCC) 1999   Right knee meniscal tear    S/P removal of thyroid  nodule    Spinal stenosis of lumbar region 03/15/2014   Past Surgical History:  Procedure Laterality Date   BACK SURGERY  1960   CARDIAC CATHETERIZATION     CATARACT EXTRACTION     COLONOSCOPY     CORONARY ARTERY BYPASS GRAFT  1999   CYSTOSCOPY/URETEROSCOPY/HOLMIUM LASER/STENT PLACEMENT Left 12/23/2017   Procedure: CYSTOSCOPY/RETROGRADE/URETEROSCOPY/HOLMIUM LASER/STENT PLACEMENT;  Surgeon: Nieves Cough, MD;  Location: [facility];  Service: Urology;  Laterality: Left;  ONLY NEEDS 60 MIN   EYE SURGERY Bilateral    bilaterl cataract removal   LUMBAR DISC SURGERY     LUMBAR LAMINECTOMY/DECOMPRESSION MICRODISCECTOMY Bilateral 07/06/2021   Procedure: Bilateral Lumbar three-four Laminectomy and foraminotomy;  Surgeon: Colon Shove, MD;  Location: [facility] OR;   Service: Neurosurgery;  Laterality: Bilateral;   Nodules on Thyroid      POLYPECTOMY     PTCA     SHOULDER ARTHROSCOPY WITH ROTATOR CUFF REPAIR AND SUBACROMIAL DECOMPRESSION Right 01/30/2015   Procedure: RIGHT ARTHROSCOPY SHOULDER SUBACROMIAL DECOMPRESSION DISTAL CLAVICAL RESECTION RETATOR CUFF REPAIR;  Surgeon: Franky Pointer, MD;  Location: [facility] OR;  Service: Orthopedics;  Laterality: Right;   TONSILLECTOMY     VASECTOMY     Patient Active Problem List   Diagnosis Date Noted   Sepsis secondary to UTI (HCC) 08/26/2024   Acute cystitis without hematuria 08/26/2024   AKI (acute kidney injury) 08/26/2024   Hyponatremia 08/26/2024   Generalized weakness 08/26/2024   Metabolic acidosis 08/26/2024   Right ear pain 12/27/2023   Rash 10/11/2023   Vitamin D  deficiency 10/11/2023   Squamous cell cancer of skin of forearm, left 10/07/2023   URI (upper respiratory infection) 03/04/2023   Basal cell carcinoma of right thigh 10/05/2022   Impairment of balance 10/05/2022   Right leg weakness 10/05/2022   Spinal stenosis of lumbar region with neurogenic claudication 07/07/2021   Spinal stenosis, lumbar region, with neurogenic claudication 07/06/2021   Dysfunction of Eustachian tube, bilateral 07/21/2020   Acquired scoliosis 05/31/2017   Gait disorder 04/20/2016   Hyperglycemia 04/20/2016   Cyst of  left kidney 03/18/2016   Radicular syndrome of right leg 08/12/2015   Radicular low back pain 08/06/2015   Abnormal CT scan, chest 06/03/2014   Erectile dysfunction 06/03/2014   Spinal stenosis of lumbar region 03/15/2014   Cough 03/12/2013   Allergic rhinitis 03/12/2013   Leg pain, bilateral 12/30/2011   Bladder neck obstruction 12/30/2011   Preventative health care 12/25/2011   Lumbar disc disease 12/25/2011   S/P removal of thyroid  nodule 12/25/2011   History of MI (myocardial infarction) 12/25/2011   Right knee meniscal tear 12/25/2011   Degenerative arthritis of hip 12/25/2011   HIP PAIN,  RIGHT 05/14/2010   FATIGUE 10/13/2009   HYPERTENSION, BENIGN 02/25/2009   CAD, AUTOLOGOUS BYPASS GRAFT 02/25/2009   CORONARY ARTERY ANEURYSM 02/25/2009   Backache 04/15/2008   Hyperlipidemia 02/13/2008   Coronary atherosclerosis 02/13/2008   Diverticulosis of colon 02/13/2008   History of cardiovascular disorder 02/13/2008   History of colonic polyps 02/13/2008    ONSET DATE:   REFERRING DIAG:  R53.1 (ICD-10-CM) - Generalized weakness    THERAPY DIAG:  Muscle weakness (generalized)  Difficulty in walking, not elsewhere classified  Unsteadiness on feet  Rationale for Evaluation and Treatment: Rehabilitation  SUBJECTIVE:                                                                                                                                                                                             SUBJECTIVE STATEMENT: Did the HEP everyday Pt accompanied by: self  PERTINENT HISTORY: Has also generalized weakness, walking with walker, still not bounced back from recent uti sepsis.  PAIN:  Are you having pain? No  PRECAUTIONS: None  RED FLAGS: None   WEIGHT BEARING RESTRICTIONS: No  FALLS: Has patient fallen in last 6 months? No  LIVING ENVIRONMENT: Lives with: lives with their spouse Lives in: House/apartment Stairs: everything on main floor Has following equipment at home: [employer] - 2 wheeled and [employer] - 4 wheeled  PLOF: Independent  PATIENT GOALS:   OBJECTIVE:   TODAY'S TREATMENT: 11/27/24 Activity Comments  HEP review Occasional cues in set-up or sequence  Supported Hip Extension at Counter With Toe Tap 2x10 Difficulty with sequence/control for LLE  Static multisensory balance   Dynamic standing balance            TODAY'S TREATMENT: 11/21/24 Activity Comments  HEP review Good recall  FGA  19/30  Sit-stand stride stance 2x10 Cues for foot placement and engagement  Sidestepping x1.5 min Cues for active hip abduction push-off   Lift-hover-hold 4 step 1x10           PATIENT EDUCATION: Education details:  assessment details, HEP initiation Person educated: Patient Education method: Explanation and Handouts Education comprehension: needs further education  HOME EXERCISE PROGRAM: Access Code: [email or web address] URL: [email or web address] Date: 11/06/2024 Prepared by: Kelly Kayla Weekes  Exercises - Corner Balance Feet Together With Eyes Open  - 1 x daily - 7 x weekly - 3 sets - 30 sec hold - Corner Balance Feet Together With Eyes Closed  - 1 x daily - 7 x weekly - 3 sets - 30 sec hold - Corner Balance Feet Together: Eyes Open With Head Turns  - 1 x daily - 7 x weekly - 3 sets - 30 sec hold - Semi-Tandem Corner Balance With Eyes Open  - 7 x weekly - 3 sets - 15-30 sec hold - Sit to Stand in Stride with AFO and UE Assist in LE Alignment  - 2-3 x weekly - 3 sets - 10 reps - Standing Foot Lift on Box (BKA)  - 2-3 x weekly - 1-2 sets - 10 reps - Side Stepping with Counter Support  - 2-3 x weekly - 1-2 sets - 1-2 min round hold - Supported Hip Extension at [employer] With Toe Tap  - 2-3 x weekly - 3 sets - 10 reps  Note: Objective measures were completed at Evaluation unless otherwise noted.  DIAGNOSTIC FINDINGS: na  COGNITION: Overall cognitive status: Within functional limits for tasks assessed   SENSATION: Not tested  COORDINATION: WFL    MUSCLE TONE: NT    POSTURE: No Significant postural limitations  LOWER EXTREMITY ROM:     Active  Right Eval Left Eval  Hip flexion    Hip extension    Hip abduction    Hip adduction    Hip internal rotation    Hip external rotation    Knee flexion    Knee extension    Ankle dorsiflexion    Ankle plantarflexion    Ankle inversion    Ankle eversion     (Blank rows = not tested)  LOWER EXTREMITY MMT:    BLE 5/5 seated resisted tests  BED MOBILITY:  Not tested  TRANSFERS: Independent   CURB:  Findings: AD needed  STAIRS: HR  needed GAIT: Findings: Comments: wide BOS, decreased stride length  FUNCTIONAL TESTS:  5 times sit to stand: 11.53 sec FGA: TBD Berg Balance Test: 44/56 M-CTSIB  Condition 1: Firm Surface, EO 30 Sec, Mild Sway  Condition 2: Firm Surface, EC 12 Sec, Moderate and Severe Sway  Condition 3: Foam Surface, EO  Sec,  Sway  Condition 4: Foam Surface, EC  Sec,  Sway                                                                                                                                  TREATMENT DATE: 11/06/24      GOALS: Goals reviewed with patient? Yes  SHORT TERM GOALS: Target date: LTG   LONG TERM GOALS: Target date: 12/04/2024  Patient will be independent in HEP to improve functional outcomes Baseline:  Goal status: IN PROGRESS  2.  Demo improved balance and reduced risk for falls per score 50/56 Berg Balance Test Baseline: 44/56 Goal status: INITIAL  3.  Demo improved safety with ambulation to reduce AD dependence per score 22/30 Functional Gait Assessment  Baseline: 19/30 Goal status: IN PROGRESS  4.  Demo improved postural control per mild sway x 30 sec condition 2 M-CTSIB to improve safety with ADL Baseline: 12 sec mod-severe Goal status: INITIAL  5.  Ambulation without AD Baseline: SBA Goal status: INITIAL    ASSESSMENT:  CLINICAL IMPRESSION: HEP review with good recall w/ clarification for stride stance. Progressed HEP for hip extension strength to improve single limb stance with greater difficulty LLE requiring tactile/physical assist for direction/sequence. Static multisensory balance activities ot improve proprioceptive awareness with improved tolernace to eyes closed w/ moderate sway x 30 sec. Dynamic standing balance to imrpove single limb support and obstacle negotiation w/ unsupported standing.  Notes low back fatigue and claudication to LE when standing without support. Continued sessions to advance POC details to meet LTG  OBJECTIVE  IMPAIRMENTS: Abnormal gait, decreased activity tolerance, decreased balance, decreased knowledge of use of DME, and difficulty walking.   ACTIVITY LIMITATIONS: carrying, lifting, standing, reach over head, and locomotion level  PARTICIPATION LIMITATIONS: meal prep, cleaning, laundry, and community activity  PERSONAL FACTORS: Age, Time since onset of injury/illness/exacerbation, and 1-2 comorbidities: PMH are also affecting patient's functional outcome.   REHAB POTENTIAL: Excellent  CLINICAL DECISION MAKING: Evolving/moderate complexity  EVALUATION COMPLEXITY: Moderate  PLAN:  PT FREQUENCY: 1x/week  PT DURATION: 4 weeks  PLANNED INTERVENTIONS: 97750- Physical Performance Testing, 97110-Therapeutic exercises, 97530- Therapeutic activity, [id number]- Neuromuscular re-education, 97535- Self Care, [id number]- Manual therapy, [id number]- Gait training, [phone]- Canalith repositioning, [id number]- Aquatic Therapy, [phone] (1-2 muscles), [id number] (3+ muscles)- Dry Needling, and Patient/Family education  PLAN FOR NEXT SESSION: HEP review and progressions   11:48 AM, 11/27/24 M. Kelly Dawayne Ohair, PT, DPT Physical Therapist- [place] Office Number: [phone]

## 2024-11-28 ENCOUNTER — Other Ambulatory Visit: Payer: Self-pay | Admitting: Physician Assistant

## 2024-11-28 MED ORDER — ROSUVASTATIN CALCIUM 5 MG PO TABS: ORAL_TABLET | ORAL | 1 refills | Status: AC

## 2024-12-04 ENCOUNTER — Ambulatory Visit

## 2024-12-04 DIAGNOSIS — R2681 Unsteadiness on feet: Secondary | ICD-10-CM

## 2024-12-04 DIAGNOSIS — M6281 Muscle weakness (generalized): Secondary | ICD-10-CM

## 2024-12-04 DIAGNOSIS — R262 Difficulty in walking, not elsewhere classified: Secondary | ICD-10-CM

## 2024-12-04 NOTE — Therapy (Signed)
 OUTPATIENT PHYSICAL THERAPY NEURO Treatment and Recertification   Patient Name: Larry [name] MRN: [id number] DOB:12-12-1932, 89 y.o., male Today's Date: 12/04/2024   PCP: [name], MD REFERRING PROVIDER: [name], MD  END OF SESSION:  PT End of Session - 12/04/24 1145     Visit Number 4    Number of Visits 5    Date for Recertification  12/18/24    Authorization Type Medicare    PT Start Time 1145    PT Stop Time 1230    PT Time Calculation (min) 45 min          Past Medical History:  Diagnosis Date   Allergic rhinitis, cause unspecified 03/12/2013   Allergy    CAD (coronary artery disease)    S/P CABG in 1999    Cataract    Decreased exercise tolerance    Exertional fatigue   Degenerative arthritis of hip    Diverticulosis of colon    ED (erectile dysfunction)    Erectile dysfunction 06/03/2014   History of colonic polyps    History of kidney stones    History of MI (myocardial infarction)    Hx of adenomatous colonic polyps 2000   Hyperlipidemia    Low HDL   Lumbar disc disease    Hx of with recent back pain and buttock pain   Myocardial infarction (HCC) 1999   Right knee meniscal tear    S/P removal of thyroid  nodule    Spinal stenosis of lumbar region 03/15/2014   Past Surgical History:  Procedure Laterality Date   BACK SURGERY  1960   CARDIAC CATHETERIZATION     CATARACT EXTRACTION     COLONOSCOPY     CORONARY ARTERY BYPASS GRAFT  1999   CYSTOSCOPY/URETEROSCOPY/HOLMIUM LASER/STENT PLACEMENT Left 12/23/2017   Procedure: CYSTOSCOPY/RETROGRADE/URETEROSCOPY/HOLMIUM LASER/STENT PLACEMENT;  Surgeon: Nieves Cough, MD;  Location: [facility];  Service: Urology;  Laterality: Left;  ONLY NEEDS 60 MIN   EYE SURGERY Bilateral    bilaterl cataract removal   LUMBAR DISC SURGERY     LUMBAR LAMINECTOMY/DECOMPRESSION MICRODISCECTOMY Bilateral 07/06/2021   Procedure: Bilateral Lumbar three-four Laminectomy and foraminotomy;  Surgeon: Colon Shove, MD;   Location: [facility] OR;  Service: Neurosurgery;  Laterality: Bilateral;   Nodules on Thyroid      POLYPECTOMY     PTCA     SHOULDER ARTHROSCOPY WITH ROTATOR CUFF REPAIR AND SUBACROMIAL DECOMPRESSION Right 01/30/2015   Procedure: RIGHT ARTHROSCOPY SHOULDER SUBACROMIAL DECOMPRESSION DISTAL CLAVICAL RESECTION RETATOR CUFF REPAIR;  Surgeon: Franky Pointer, MD;  Location: [facility];  Service: Orthopedics;  Laterality: Right;   TONSILLECTOMY     VASECTOMY     Patient Active Problem List   Diagnosis Date Noted   Sepsis secondary to UTI (HCC) 08/26/2024   Acute cystitis without hematuria 08/26/2024   AKI (acute kidney injury) 08/26/2024   Hyponatremia 08/26/2024   Generalized weakness 08/26/2024   Metabolic acidosis 08/26/2024   Right ear pain 12/27/2023   Rash 10/11/2023   Vitamin D  deficiency 10/11/2023   Squamous cell cancer of skin of forearm, left 10/07/2023   URI (upper respiratory infection) 03/04/2023   Basal cell carcinoma of right thigh 10/05/2022   Impairment of balance 10/05/2022   Right leg weakness 10/05/2022   Spinal stenosis of lumbar region with neurogenic claudication 07/07/2021   Spinal stenosis, lumbar region, with neurogenic claudication 07/06/2021   Dysfunction of Eustachian tube, bilateral 07/21/2020   Acquired scoliosis 05/31/2017   Gait disorder 04/20/2016   Hyperglycemia 04/20/2016  Cyst of left kidney 03/18/2016   Radicular syndrome of right leg 08/12/2015   Radicular low back pain 08/06/2015   Abnormal CT scan, chest 06/03/2014   Erectile dysfunction 06/03/2014   Spinal stenosis of lumbar region 03/15/2014   Cough 03/12/2013   Allergic rhinitis 03/12/2013   Leg pain, bilateral 12/30/2011   Bladder neck obstruction 12/30/2011   Preventative health care 12/25/2011   Lumbar disc disease 12/25/2011   S/P removal of thyroid  nodule 12/25/2011   History of MI (myocardial infarction) 12/25/2011   Right knee meniscal tear 12/25/2011   Degenerative arthritis of hip  12/25/2011   HIP PAIN, RIGHT 05/14/2010   FATIGUE 10/13/2009   HYPERTENSION, BENIGN 02/25/2009   CAD, AUTOLOGOUS BYPASS GRAFT 02/25/2009   CORONARY ARTERY ANEURYSM 02/25/2009   Backache 04/15/2008   Hyperlipidemia 02/13/2008   Coronary atherosclerosis 02/13/2008   Diverticulosis of colon 02/13/2008   History of cardiovascular disorder 02/13/2008   History of colonic polyps 02/13/2008    ONSET DATE:   REFERRING DIAG:  R53.1 (ICD-10-CM) - Generalized weakness    THERAPY DIAG:  Muscle weakness (generalized)  Difficulty in walking, not elsewhere classified  Unsteadiness on feet  Rationale for Evaluation and Treatment: Rehabilitation  SUBJECTIVE:                                                                                                                                                                                             SUBJECTIVE STATEMENT: Doing the HEP and feeling more confident and steady on the feet Pt accompanied by: self  PERTINENT HISTORY: Has also generalized weakness, walking with walker, still not bounced back from recent uti sepsis.  PAIN:  Are you having pain? No  PRECAUTIONS: None  RED FLAGS: None   WEIGHT BEARING RESTRICTIONS: No  FALLS: Has patient fallen in last 6 months? No  LIVING ENVIRONMENT: Lives with: lives with their spouse Lives in: House/apartment Stairs: everything on main floor Has following equipment at home: [employer] - 2 wheeled and [employer] - 4 wheeled  PLOF: Independent  PATIENT GOALS:   OBJECTIVE:   TODAY'S TREATMENT: 12/04/24 Activity Comments  HEP review  Standing hip abd LAQ Addition of 2#   Static multisensory balance   Righting reactions                     PATIENT EDUCATION: Education details: assessment details, HEP initiation Person educated: Patient Education method: Explanation and Handouts Education comprehension: needs further education  HOME EXERCISE PROGRAM: Access Code: [email or web address] URL:  [email or web address] Date: 11/06/2024 Prepared by: Kelly [place] Ar[place]ave  Exercises - Corner Balance Feet Together With Eyes Open  -  1 x daily - 7 x weekly - 3 sets - 30 sec hold - Corner Balance Feet Together With Eyes Closed  - 1 x daily - 7 x weekly - 3 sets - 30 sec hold - Corner Balance Feet Together: Eyes Open With Head Turns  - 1 x daily - 7 x weekly - 3 sets - 30 sec hold - Semi-Tandem Corner Balance With Eyes Open  - 7 x weekly - 3 sets - 15-30 sec hold - Sit to Stand in Stride with AFO and UE Assist in LE Alignment  - 2-3 x weekly - 3 sets - 10 reps - Standing Foot Lift on Box (BKA)  - 2-3 x weekly - 1-2 sets - 10 reps - Side Stepping with Counter Support  - 2-3 x weekly - 1-2 sets - 1-2 min round hold - Supported Hip Extension at [employer] With Toe Tap  - 2-3 x weekly - 3 sets - 10 reps  Note: Objective measures were completed at Evaluation unless otherwise noted.  DIAGNOSTIC FINDINGS: na  COGNITION: Overall cognitive status: Within functional limits for tasks assessed   SENSATION: Not tested  COORDINATION: WFL    MUSCLE TONE: NT    POSTURE: No Significant postural limitations  LOWER EXTREMITY ROM:     Active  Right Eval Left Eval  Hip flexion    Hip extension    Hip abduction    Hip adduction    Hip internal rotation    Hip external rotation    Knee flexion    Knee extension    Ankle dorsiflexion    Ankle plantarflexion    Ankle inversion    Ankle eversion     (Blank rows = not tested)  LOWER EXTREMITY MMT:    BLE 5/5 seated resisted tests  BED MOBILITY:  Not tested  TRANSFERS: Independent   CURB:  Findings: AD needed  STAIRS: HR needed GAIT: Findings: Comments: wide BOS, decreased stride length  FUNCTIONAL TESTS:  5 times sit to stand: 11.53 sec FGA: TBD Berg Balance Test: 44/56 M-CTSIB  Condition 1: Firm Surface, EO 30 Sec, Mild Sway  Condition 2: Firm Surface, EC 12 Sec, Moderate and Severe Sway  Condition 3: Foam  Surface, EO  Sec,  Sway  Condition 4: Foam Surface, EC  Sec,  Sway                                                                                                                                  TREATMENT DATE: 11/06/24      GOALS: Goals reviewed with patient? Yes  SHORT TERM GOALS: Target date: LTG   LONG TERM GOALS: Target date: 12/18/2024    Patient will be independent in HEP to improve functional outcomes Baseline:  Goal status: MET  2.  Demo improved balance and reduced risk for falls per score 50/56 Berg Balance Test Baseline: 44/56 Goal status: INITIAL  3.  Demo improved  safety with ambulation to reduce AD dependence per score 22/30 Functional Gait Assessment  Baseline: 19/30 Goal status: IN PROGRESS  4.  Demo improved postural control per mild sway x 30 sec condition 2 M-CTSIB to improve safety with ADL Baseline: 12 sec mod-severe; mild-moderate x 30 Goal status: IN PROGRESS  5.  Ambulation without AD Baseline: SBA; household/room distance without Goal status: MET    ASSESSMENT:  CLINICAL IMPRESSION: Reports ongoing compliance with activities. Progressed HEP with addition of ankle weights for open chain movements and addition of open chain strength/endurance movements for BLE to improve activity tolerance. Improved postural control noted maintaining mild-moderate sway condition 4 M-CTSIB from previous 12 sec w/ severe/LOB.  Pt reports improved balance and confidence and has been ambulating short, household distances without AD and feels confident in doing so.  Anticipate 1 additional session for HEP review and refinement and D/C assessment  OBJECTIVE IMPAIRMENTS: Abnormal gait, decreased activity tolerance, decreased balance, decreased knowledge of use of DME, and difficulty walking.   ACTIVITY LIMITATIONS: carrying, lifting, standing, reach over head, and locomotion level  PARTICIPATION LIMITATIONS: meal prep, cleaning, laundry, and community  activity  PERSONAL FACTORS: Age, Time since onset of injury/illness/exacerbation, and 1-2 comorbidities: PMH are also affecting patient's functional outcome.   REHAB POTENTIAL: Excellent  CLINICAL DECISION MAKING: Evolving/moderate complexity  EVALUATION COMPLEXITY: Moderate  PLAN:  PT FREQUENCY: 1x/week  PT DURATION: 2 weeks  PLANNED INTERVENTIONS: 97750- Physical Performance Testing, 97110-Therapeutic exercises, 97530- Therapeutic activity, [id number]- Neuromuscular re-education, 97535- Self Care, 02859- Manual therapy, [id number]- Gait training, [id number]- Canalith repositioning, [id number]- Aquatic Therapy, [phone] (1-2 muscles), 20561 (3+ muscles)- Dry Needling, and Patient/Family education  PLAN FOR NEXT SESSION: HEP review and progressions. D/C assessment   12:37 PM, 12/04/2024 M. Kelly Parmvir Boomer, PT, DPT Physical Therapist- [place] Office Number: [phone]

## 2024-12-11 ENCOUNTER — Ambulatory Visit

## 2024-12-11 DIAGNOSIS — R262 Difficulty in walking, not elsewhere classified: Secondary | ICD-10-CM

## 2024-12-11 DIAGNOSIS — M6281 Muscle weakness (generalized): Secondary | ICD-10-CM | POA: Diagnosis not present

## 2024-12-11 DIAGNOSIS — R2681 Unsteadiness on feet: Secondary | ICD-10-CM

## 2024-12-11 NOTE — Therapy (Signed)
 " OUTPATIENT PHYSICAL THERAPY NEURO TREATMENT and D/C Summary   Patient Name: Larry [name] MRN: [id number] DOB:[birth year], 89 y.o., male Today's Date: 12/11/2024   PCP: [name], MD REFERRING PROVIDER: [name], MD  PHYSICAL THERAPY DISCHARGE SUMMARY  Visits from Start of Care: 5  Current functional level related to goals / functional outcomes: See below for outcome measures, goals met   Remaining deficits: Some limitation to gait tolerance without AD likely from neurogenic claudication. Slight gait deviations and recommend use of cane or walker   Education / Equipment: HEP   Patient agrees to discharge. Patient goals were met. Patient is being discharged due to being pleased with the current functional level.   END OF SESSION:  PT End of Session - 12/11/24 1149     Visit Number 5    Number of Visits 5    Date for Recertification  12/18/24    Authorization Type Medicare    PT Start Time 1145    PT Stop Time 1230    PT Time Calculation (min) 45 min          Past Medical History:  Diagnosis Date   Allergic rhinitis, cause unspecified 03/12/2013   Allergy    CAD (coronary artery disease)    S/P CABG in 1999    Cataract    Decreased exercise tolerance    Exertional fatigue   Degenerative arthritis of hip    Diverticulosis of colon    ED (erectile dysfunction)    Erectile dysfunction 06/03/2014   History of colonic polyps    History of kidney stones    History of MI (myocardial infarction)    Hx of adenomatous colonic polyps 2000   Hyperlipidemia    Low HDL   Lumbar disc disease    Hx of with recent back pain and buttock pain   Myocardial infarction (HCC) 1999   Right knee meniscal tear    S/P removal of thyroid  nodule    Spinal stenosis of lumbar region 03/15/2014   Past Surgical History:  Procedure Laterality Date   BACK SURGERY  1960   CARDIAC CATHETERIZATION     CATARACT EXTRACTION     COLONOSCOPY     CORONARY ARTERY BYPASS GRAFT   1999   CYSTOSCOPY/URETEROSCOPY/HOLMIUM LASER/STENT PLACEMENT Left 12/23/2017   Procedure: CYSTOSCOPY/RETROGRADE/URETEROSCOPY/HOLMIUM LASER/STENT PLACEMENT;  Surgeon: Nieves Cough, MD;  Location: [facility];  Service: Urology;  Laterality: Left;  ONLY NEEDS 60 MIN   EYE SURGERY Bilateral    bilaterl cataract removal   LUMBAR DISC SURGERY     LUMBAR LAMINECTOMY/DECOMPRESSION MICRODISCECTOMY Bilateral 07/06/2021   Procedure: Bilateral Lumbar three-four Laminectomy and foraminotomy;  Surgeon: Colon Shove, MD;  Location: [facility];  Service: Neurosurgery;  Laterality: Bilateral;   Nodules on Thyroid      POLYPECTOMY     PTCA     SHOULDER ARTHROSCOPY WITH ROTATOR CUFF REPAIR AND SUBACROMIAL DECOMPRESSION Right 01/30/2015   Procedure: RIGHT ARTHROSCOPY SHOULDER SUBACROMIAL DECOMPRESSION DISTAL CLAVICAL RESECTION RETATOR CUFF REPAIR;  Surgeon: Franky Pointer, MD;  Location: [facility] OR;  Service: Orthopedics;  Laterality: Right;   TONSILLECTOMY     VASECTOMY     Patient Active Problem List   Diagnosis Date Noted   Sepsis secondary to UTI (HCC) 08/26/2024   Acute cystitis without hematuria 08/26/2024   AKI (acute kidney injury) 08/26/2024   Hyponatremia 08/26/2024   Generalized weakness 08/26/2024   Metabolic acidosis 08/26/2024   Right ear pain 12/27/2023   Rash 10/11/2023   Vitamin D   deficiency 10/11/2023   Squamous cell cancer of skin of forearm, left 10/07/2023   URI (upper respiratory infection) 03/04/2023   Basal cell carcinoma of right thigh 10/05/2022   Impairment of balance 10/05/2022   Right leg weakness 10/05/2022   Spinal stenosis of lumbar region with neurogenic claudication 07/07/2021   Spinal stenosis, lumbar region, with neurogenic claudication 07/06/2021   Dysfunction of Eustachian tube, bilateral 07/21/2020   Acquired scoliosis 05/31/2017   Gait disorder 04/20/2016   Hyperglycemia 04/20/2016   Cyst of left kidney 03/18/2016   Radicular syndrome of right leg 08/12/2015    Radicular low back pain 08/06/2015   Abnormal CT scan, chest 06/03/2014   Erectile dysfunction 06/03/2014   Spinal stenosis of lumbar region 03/15/2014   Cough 03/12/2013   Allergic rhinitis 03/12/2013   Leg pain, bilateral 12/30/2011   Bladder neck obstruction 12/30/2011   Preventative health care 12/25/2011   Lumbar disc disease 12/25/2011   S/P removal of thyroid  nodule 12/25/2011   History of MI (myocardial infarction) 12/25/2011   Right knee meniscal tear 12/25/2011   Degenerative arthritis of hip 12/25/2011   HIP PAIN, RIGHT 05/14/2010   FATIGUE 10/13/2009   HYPERTENSION, BENIGN 02/25/2009   CAD, AUTOLOGOUS BYPASS GRAFT 02/25/2009   CORONARY ARTERY ANEURYSM 02/25/2009   Backache 04/15/2008   Hyperlipidemia 02/13/2008   Coronary atherosclerosis 02/13/2008   Diverticulosis of colon 02/13/2008   History of cardiovascular disorder 02/13/2008   History of colonic polyps 02/13/2008    ONSET DATE:   REFERRING DIAG:  R53.1 (ICD-10-CM) - Generalized weakness    THERAPY DIAG:  Muscle weakness (generalized)  Difficulty in walking, not elsewhere classified  Unsteadiness on feet  Rationale for Evaluation and Treatment: Rehabilitation  SUBJECTIVE:                                                                                                                                                                                             SUBJECTIVE STATEMENT: Doing the HEP and feeling better and improved in balance. Feeling more confident  Pt accompanied by: self  PERTINENT HISTORY: Has also generalized weakness, walking with walker, still not bounced back from recent uti sepsis.  PAIN:  Are you having pain? No  PRECAUTIONS: None  RED FLAGS: None   WEIGHT BEARING RESTRICTIONS: No  FALLS: Has patient fallen in last 6 months? No  LIVING ENVIRONMENT: Lives with: lives with their spouse Lives in: House/apartment Stairs: everything on main floor Has following equipment  at home: V[employer] - 2 wheeled and [employer] - 4 wheeled  PLOF: Independent  PATIENT GOALS:   OBJECTIVE:   TODAY'S TREATMENT: 12/11/24 Activity Comments  POC performance and review   Forward tandem/retrowalk At counter x 60 sec                TODAY'S TREATMENT: 12/04/24 Activity Comments  HEP review  Standing hip abd LAQ Addition of 2#   Static multisensory balance   Righting reactions               [facility] PT Assessment - 12/11/24 0001       Berg Balance Test   Sit to Stand Able to stand without using hands and stabilize independently    Standing Unsupported Able to stand safely 2 minutes    Sitting with Back Unsupported but Feet Supported on Floor or Stool Able to sit safely and securely 2 minutes    Stand to Sit Sits safely with minimal use of hands    Transfers Able to transfer safely, minor use of hands    Standing Unsupported with Eyes Closed Able to stand 10 seconds safely    Standing Unsupported with Feet Together Able to place feet together independently and stand 1 minute safely    From Standing, Reach Forward with Outstretched Arm Can reach confidently >25 cm (10)    From Standing Position, Pick up Object from Floor Able to pick up shoe safely and easily    From Standing Position, Turn to Look Behind Over each Shoulder Looks behind from both sides and weight shifts well    Turn 360 Degrees Able to turn 360 degrees safely but slowly    Standing Unsupported, Alternately Place Feet on Step/Stool Able to stand independently and safely and complete 8 steps in 20 seconds    Standing Unsupported, One Foot in Front Able to plae foot ahead of the other independently and hold 30 seconds    Standing on One Leg Able to lift leg independently and hold 5-10 seconds    Total Score 52      Functional Gait  Assessment   Gait assessed  Yes    Gait Level Surface Walks 20 ft in less than 7 sec but greater than 5.5 sec, uses assistive device, slower speed, mild gait deviations, or  deviates 6-10 in outside of the 12 in walkway width.    Change in Gait Speed Able to smoothly change walking speed without loss of balance or gait deviation. Deviate no more than 6 in outside of the 12 in walkway width.    Gait with Horizontal Head Turns Performs head turns smoothly with slight change in gait velocity (eg, minor disruption to smooth gait path), deviates 6-10 in outside 12 in walkway width, or uses an assistive device.    Gait with Vertical Head Turns Performs head turns with no change in gait. Deviates no more than 6 in outside 12 in walkway width.    Gait and Pivot Turn Pivot turns safely within 3 sec and stops quickly with no loss of balance.    Step Over Obstacle Is able to step over 2 stacked shoe boxes taped together (9 in total height) without changing gait speed. No evidence of imbalance.    Gait with Narrow Base of Support Ambulates 7-9 steps.    Gait with Eyes Closed Walks 20 ft, slow speed, abnormal gait pattern, evidence for imbalance, deviates 10-15 in outside 12 in walkway width. Requires more than 9 sec to ambulate 20 ft.    Ambulating Backwards Walks 20 ft, uses assistive device, slower speed, mild gait deviations, deviates 6-10 in outside 12 in walkway width.  Steps Alternating feet, no rail.    Total Score 24                PATIENT EDUCATION: Education details: assessment details, HEP initiation Person educated: Patient Education method: Explanation and Handouts Education comprehension: needs further education  HOME EXERCISE PROGRAM: Access Code: [email or web address] URL: [email or web address] Date: 11/06/2024 Prepared by: Kelly Jatziri Goffredo  Exercises - Corner Balance Feet Together With Eyes Open  - 1 x daily - 7 x weekly - 3 sets - 30 sec hold - Corner Balance Feet Together With Eyes Closed  - 1 x daily - 7 x weekly - 3 sets - 30 sec hold - Corner Balance Feet Together: Eyes Open With Head Turns  - 1 x daily - 7 x weekly - 3 sets - 30 sec hold -  Semi-Tandem Corner Balance With Eyes Open  - 7 x weekly - 3 sets - 15-30 sec hold - Sit to Stand in Stride with AFO and UE Assist in LE Alignment  - 2-3 x weekly - 3 sets - 10 reps - Standing Foot Lift on Box (BKA)  - 2-3 x weekly - 1-2 sets - 10 reps - Side Stepping with Counter Support  - 2-3 x weekly - 1-2 sets - 1-2 min round hold - Supported Hip Extension at [employer] With Toe Tap  - 2-3 x weekly - 3 sets - 10 reps - Tandem Walking with Counter Support  - 1 x daily - 7 x weekly - 2-3 sets - 1-2 min hold - Backward Walking with Counter Support  - 1 x daily - 7 x weekly - 2-3 sets - 1-2 min hold  Note: Objective measures were completed at Evaluation unless otherwise noted.  DIAGNOSTIC FINDINGS: na  COGNITION: Overall cognitive status: Within functional limits for tasks assessed   SENSATION: Not tested  COORDINATION: WFL    MUSCLE TONE: NT    POSTURE: No Significant postural limitations  LOWER EXTREMITY ROM:     Active  Right Eval Left Eval  Hip flexion    Hip extension    Hip abduction    Hip adduction    Hip internal rotation    Hip external rotation    Knee flexion    Knee extension    Ankle dorsiflexion    Ankle plantarflexion    Ankle inversion    Ankle eversion     (Blank rows = not tested)  LOWER EXTREMITY MMT:    BLE 5/5 seated resisted tests  BED MOBILITY:  Not tested  TRANSFERS: Independent   CURB:  Findings: AD needed  STAIRS: HR needed GAIT: Findings: Comments: wide BOS, decreased stride length  FUNCTIONAL TESTS:  5 times sit to stand: 11.53 sec FGA: TBD Berg Balance Test: 44/56 M-CTSIB  Condition 1: Firm Surface, EO 30 Sec, Mild Sway  Condition 2: Firm Surface, EC 12 Sec, Moderate and Severe Sway  Condition 3: Foam Surface, EO  Sec,  Sway  Condition 4: Foam Surface, EC  Sec,  Sway  TREATMENT DATE:  11/06/24      GOALS: Goals reviewed with patient? Yes  SHORT TERM GOALS: Target date: LTG   LONG TERM GOALS: Target date: 12/18/2024    Patient will be independent in HEP to improve functional outcomes Baseline:  Goal status: MET  2.  Demo improved balance and reduced risk for falls per score 50/56 Berg Balance Test Baseline: 44/56; 52/56 Goal status: MET  3.  Demo improved safety with ambulation to reduce AD dependence per score 22/30 Functional Gait Assessment  Baseline: 19/30; 24/30 Goal status: MET  4.  Demo improved postural control per mild sway x 30 sec condition 2 M-CTSIB to improve safety with ADL Baseline: 12 sec mod-severe; mild-moderate x 30; mild x 30 sec Goal status: MET  5.  Ambulation without AD Baseline: SBA; household/room distance without Goal status: MET    ASSESSMENT:  CLINICAL IMPRESSION: Reports improved balance and confidence with community ambulation. Walking household distances w/out AD and able to walk community distances w/ cane or 4WW.  Demo low risk for falls from previous tests Berg Balance Test imrpoved to 52/56 from 44 and FGA from 19 to 24/30 indicating low risk for falls.  Excellent recall to HEP and advanced additional dynamic balance activities.  Improved postural control under M-CTSIB.  Pt reports confidence in self-mgmt at this time and will D/C to HEP  OBJECTIVE IMPAIRMENTS: Abnormal gait, decreased activity tolerance, decreased balance, decreased knowledge of use of DME, and difficulty walking.   ACTIVITY LIMITATIONS: carrying, lifting, standing, reach over head, and locomotion level  PARTICIPATION LIMITATIONS: meal prep, cleaning, laundry, and community activity  PERSONAL FACTORS: Age, Time since onset of injury/illness/exacerbation, and 1-2 comorbidities: PMH are also affecting patient's functional outcome.   REHAB POTENTIAL: Excellent  CLINICAL DECISION MAKING: Evolving/moderate complexity  EVALUATION COMPLEXITY:  Moderate  PLAN:  PT FREQUENCY: 1x/week  PT DURATION: 2 weeks  PLANNED INTERVENTIONS: 97750- Physical Performance Testing, 97110-Therapeutic exercises, 97530- Therapeutic activity, [id number]- Neuromuscular re-education, 97535- Self Care, [id number]- Manual therapy, [id number]- Gait training, [phone]- Canalith repositioning, [id number]- Aquatic Therapy, [phone] (1-2 muscles), [id number] (3+ muscles)- Dry Needling, and Patient/Family education  PLAN FOR NEXT SESSION: D/C assessment   11:50 AM, 12/11/2024 M. Kelly Carla Rashad, PT, DPT Physical Therapist-  Office Number: [phone]         "

## 2025-04-08 ENCOUNTER — Ambulatory Visit: Admitting: Internal Medicine

## 2025-10-10 ENCOUNTER — Ambulatory Visit
